# Patient Record
Sex: Male | Born: 1935 | Race: White | Hispanic: No | Marital: Single | Smoking: Former smoker
Health system: Southern US, Community
[De-identification: ages and names within clinical notes are randomized; demographics above are authoritative.]

## PROBLEM LIST (undated history)

## (undated) DIAGNOSIS — M1990 Unspecified osteoarthritis, unspecified site: Secondary | ICD-10-CM

## (undated) DIAGNOSIS — Z860100 Personal history of colon polyps, unspecified: Secondary | ICD-10-CM

## (undated) DIAGNOSIS — I10 Essential (primary) hypertension: Secondary | ICD-10-CM

## (undated) DIAGNOSIS — M19049 Primary osteoarthritis, unspecified hand: Secondary | ICD-10-CM

## (undated) DIAGNOSIS — R0600 Dyspnea, unspecified: Secondary | ICD-10-CM

## (undated) DIAGNOSIS — I498 Other specified cardiac arrhythmias: Secondary | ICD-10-CM

## (undated) DIAGNOSIS — I4510 Unspecified right bundle-branch block: Secondary | ICD-10-CM

## (undated) DIAGNOSIS — G4733 Obstructive sleep apnea (adult) (pediatric): Secondary | ICD-10-CM

## (undated) HISTORY — DX: Personal history of colon polyps, unspecified: Z86.0100

## (undated) HISTORY — PX: PILONIDAL CYST DRAINAGE: SHX743

## (undated) HISTORY — DX: Unspecified osteoarthritis, unspecified site: M19.90

## (undated) HISTORY — DX: Obstructive sleep apnea (adult) (pediatric): G47.33

## (undated) HISTORY — DX: Unspecified right bundle-branch block: I45.10

## (undated) HISTORY — DX: Personal history of colonic polyps: Z86.010

## (undated) HISTORY — DX: Other specified cardiac arrhythmias: I49.8

## (undated) HISTORY — DX: Dyspnea, unspecified: R06.00

## (undated) HISTORY — DX: Primary osteoarthritis, unspecified hand: M19.049

## (undated) HISTORY — DX: Essential (primary) hypertension: I10

---

## 1958-05-15 HISTORY — PX: HEMORRHOID SURGERY: SHX153

## 1998-10-13 ENCOUNTER — Ambulatory Visit (HOSPITAL_COMMUNITY): Admission: RE | Admit: 1998-10-13 | Discharge: 1998-10-13 | Payer: Self-pay | Admitting: Internal Medicine

## 1998-10-13 ENCOUNTER — Encounter: Payer: Self-pay | Admitting: Internal Medicine

## 2001-02-14 ENCOUNTER — Ambulatory Visit (HOSPITAL_BASED_OUTPATIENT_CLINIC_OR_DEPARTMENT_OTHER): Admission: RE | Admit: 2001-02-14 | Discharge: 2001-02-14 | Payer: Self-pay | Admitting: Internal Medicine

## 2002-05-15 DIAGNOSIS — I498 Other specified cardiac arrhythmias: Secondary | ICD-10-CM

## 2002-05-15 HISTORY — PX: CARDIAC ELECTROPHYSIOLOGY STUDY AND ABLATION: SHX1294

## 2002-05-15 HISTORY — DX: Other specified cardiac arrhythmias: I49.8

## 2002-06-18 ENCOUNTER — Ambulatory Visit (HOSPITAL_COMMUNITY): Admission: RE | Admit: 2002-06-18 | Discharge: 2002-06-18 | Payer: Self-pay | Admitting: Internal Medicine

## 2002-06-18 ENCOUNTER — Encounter: Payer: Self-pay | Admitting: Internal Medicine

## 2002-08-06 ENCOUNTER — Ambulatory Visit (HOSPITAL_COMMUNITY): Admission: RE | Admit: 2002-08-06 | Discharge: 2002-08-06 | Payer: Self-pay | Admitting: Internal Medicine

## 2002-08-06 ENCOUNTER — Encounter: Payer: Self-pay | Admitting: Internal Medicine

## 2002-11-09 ENCOUNTER — Emergency Department (HOSPITAL_COMMUNITY): Admission: EM | Admit: 2002-11-09 | Discharge: 2002-11-09 | Payer: Self-pay | Admitting: Emergency Medicine

## 2004-05-31 ENCOUNTER — Ambulatory Visit: Payer: Self-pay | Admitting: Gastroenterology

## 2004-06-08 ENCOUNTER — Ambulatory Visit: Payer: Self-pay | Admitting: Gastroenterology

## 2004-07-29 ENCOUNTER — Ambulatory Visit: Payer: Self-pay | Admitting: Endocrinology

## 2004-10-05 ENCOUNTER — Ambulatory Visit: Payer: Self-pay | Admitting: Cardiovascular Disease

## 2004-10-06 ENCOUNTER — Ambulatory Visit: Payer: Self-pay | Admitting: Internal Medicine

## 2004-10-31 ENCOUNTER — Ambulatory Visit: Payer: Self-pay | Admitting: Internal Medicine

## 2004-12-05 ENCOUNTER — Ambulatory Visit: Payer: Self-pay | Admitting: Internal Medicine

## 2005-08-09 ENCOUNTER — Ambulatory Visit: Payer: Self-pay | Admitting: Cardiovascular Disease

## 2005-11-09 ENCOUNTER — Ambulatory Visit: Payer: Self-pay | Admitting: Internal Medicine

## 2005-12-15 ENCOUNTER — Ambulatory Visit: Payer: Self-pay | Admitting: Internal Medicine

## 2005-12-18 ENCOUNTER — Ambulatory Visit: Payer: Self-pay | Admitting: Internal Medicine

## 2006-07-12 ENCOUNTER — Ambulatory Visit: Payer: Self-pay | Admitting: Cardiovascular Disease

## 2006-08-29 ENCOUNTER — Ambulatory Visit: Payer: Self-pay

## 2007-01-31 ENCOUNTER — Ambulatory Visit: Payer: Self-pay | Admitting: Internal Medicine

## 2007-01-31 LAB — CONVERTED CEMR LAB
ALT: 33 units/L (ref 0–53)
AST: 27 units/L (ref 0–37)
Albumin: 3.9 g/dL (ref 3.5–5.2)
Alkaline Phosphatase: 15 units/L — ABNORMAL LOW (ref 39–117)
BUN: 12 mg/dL (ref 6–23)
Basophils Absolute: 0.1 10*3/uL (ref 0.0–0.1)
Basophils Relative: 0.8 % (ref 0.0–1.0)
Bilirubin Urine: NEGATIVE
Bilirubin, Direct: 0.1 mg/dL (ref 0.0–0.3)
CO2: 30 meq/L (ref 19–32)
Calcium: 9.5 mg/dL (ref 8.4–10.5)
Chloride: 102 meq/L (ref 96–112)
Creatinine, Ser: 1 mg/dL (ref 0.4–1.5)
Eosinophils Absolute: 0.2 10*3/uL (ref 0.0–0.6)
Eosinophils Relative: 1.5 % (ref 0.0–5.0)
GFR calc Af Amer: 96 mL/min
GFR calc non Af Amer: 79 mL/min
Glucose, Bld: 93 mg/dL (ref 70–99)
HCT: 45.5 % (ref 39.0–52.0)
Hemoglobin, Urine: NEGATIVE
Hemoglobin: 15.8 g/dL (ref 13.0–17.0)
Ketones, ur: NEGATIVE mg/dL
Leukocytes, UA: NEGATIVE
Lymphocytes Relative: 25.4 % (ref 12.0–46.0)
MCHC: 34.6 g/dL (ref 30.0–36.0)
MCV: 86.6 fL (ref 78.0–100.0)
Monocytes Absolute: 1 10*3/uL — ABNORMAL HIGH (ref 0.2–0.7)
Monocytes Relative: 9.4 % (ref 3.0–11.0)
Neutro Abs: 6.5 10*3/uL (ref 1.4–7.7)
Neutrophils Relative %: 62.9 % (ref 43.0–77.0)
Nitrite: NEGATIVE
PSA: 0.61 ng/mL (ref 0.10–4.00)
Platelets: 277 10*3/uL (ref 150–400)
Potassium: 4.3 meq/L (ref 3.5–5.1)
RBC: 5.26 M/uL (ref 4.22–5.81)
RDW: 13.3 % (ref 11.5–14.6)
Sodium: 140 meq/L (ref 135–145)
Specific Gravity, Urine: 1.02 (ref 1.000–1.03)
TSH: 2.17 microintl units/mL (ref 0.35–5.50)
Total Bilirubin: 0.8 mg/dL (ref 0.3–1.2)
Total Protein, Urine: NEGATIVE mg/dL
Total Protein: 7.4 g/dL (ref 6.0–8.3)
Urine Glucose: NEGATIVE mg/dL
Urobilinogen, UA: 0.2 (ref 0.0–1.0)
WBC: 10.5 10*3/uL (ref 4.5–10.5)
pH: 6 (ref 5.0–8.0)

## 2007-05-23 ENCOUNTER — Telehealth: Payer: Self-pay | Admitting: Internal Medicine

## 2007-05-27 ENCOUNTER — Ambulatory Visit: Payer: Self-pay | Admitting: Internal Medicine

## 2007-05-27 LAB — CONVERTED CEMR LAB: Uric Acid, Serum: 7.5 mg/dL — ABNORMAL HIGH (ref 2.4–7.0)

## 2007-05-29 ENCOUNTER — Encounter: Payer: Self-pay | Admitting: Internal Medicine

## 2007-06-25 ENCOUNTER — Ambulatory Visit: Payer: Self-pay | Admitting: Internal Medicine

## 2007-06-25 DIAGNOSIS — M109 Gout, unspecified: Secondary | ICD-10-CM | POA: Insufficient documentation

## 2007-06-25 DIAGNOSIS — I10 Essential (primary) hypertension: Secondary | ICD-10-CM | POA: Insufficient documentation

## 2007-07-23 ENCOUNTER — Ambulatory Visit: Payer: Self-pay | Admitting: Internal Medicine

## 2007-07-23 DIAGNOSIS — M19019 Primary osteoarthritis, unspecified shoulder: Secondary | ICD-10-CM | POA: Insufficient documentation

## 2007-07-23 DIAGNOSIS — M6530 Trigger finger, unspecified finger: Secondary | ICD-10-CM | POA: Insufficient documentation

## 2008-01-17 ENCOUNTER — Ambulatory Visit: Payer: Self-pay | Admitting: Cardiovascular Disease

## 2008-01-17 LAB — CONVERTED CEMR LAB: Hgb A1c MFr Bld: 6.4 % — ABNORMAL HIGH (ref 4.6–6.0)

## 2008-03-23 ENCOUNTER — Ambulatory Visit: Payer: Self-pay | Admitting: Internal Medicine

## 2008-03-23 DIAGNOSIS — E1129 Type 2 diabetes mellitus with other diabetic kidney complication: Secondary | ICD-10-CM | POA: Insufficient documentation

## 2008-03-23 DIAGNOSIS — M79609 Pain in unspecified limb: Secondary | ICD-10-CM | POA: Insufficient documentation

## 2008-04-21 ENCOUNTER — Ambulatory Visit: Payer: Self-pay | Admitting: Internal Medicine

## 2008-07-16 ENCOUNTER — Ambulatory Visit: Payer: Self-pay | Admitting: Internal Medicine

## 2008-07-16 LAB — CONVERTED CEMR LAB: Hgb A1c MFr Bld: 6.2 % — ABNORMAL HIGH (ref 4.6–6.0)

## 2008-07-17 ENCOUNTER — Telehealth: Payer: Self-pay | Admitting: Internal Medicine

## 2008-10-08 ENCOUNTER — Telehealth: Payer: Self-pay | Admitting: Internal Medicine

## 2008-12-16 ENCOUNTER — Telehealth: Payer: Self-pay | Admitting: Internal Medicine

## 2009-01-14 DIAGNOSIS — I498 Other specified cardiac arrhythmias: Secondary | ICD-10-CM | POA: Insufficient documentation

## 2009-01-14 DIAGNOSIS — D126 Benign neoplasm of colon, unspecified: Secondary | ICD-10-CM | POA: Insufficient documentation

## 2009-01-14 DIAGNOSIS — I4510 Unspecified right bundle-branch block: Secondary | ICD-10-CM | POA: Insufficient documentation

## 2009-01-14 DIAGNOSIS — M129 Arthropathy, unspecified: Secondary | ICD-10-CM | POA: Insufficient documentation

## 2009-01-19 ENCOUNTER — Ambulatory Visit: Payer: Self-pay | Admitting: Cardiovascular Disease

## 2009-02-09 ENCOUNTER — Telehealth: Payer: Self-pay | Admitting: Internal Medicine

## 2009-04-28 ENCOUNTER — Ambulatory Visit: Payer: Self-pay | Admitting: Internal Medicine

## 2009-04-28 DIAGNOSIS — H6122 Impacted cerumen, left ear: Secondary | ICD-10-CM | POA: Insufficient documentation

## 2009-04-28 DIAGNOSIS — R0609 Other forms of dyspnea: Secondary | ICD-10-CM | POA: Insufficient documentation

## 2009-04-28 DIAGNOSIS — R0600 Dyspnea, unspecified: Secondary | ICD-10-CM | POA: Insufficient documentation

## 2009-04-28 DIAGNOSIS — H9190 Unspecified hearing loss, unspecified ear: Secondary | ICD-10-CM | POA: Insufficient documentation

## 2009-04-28 LAB — CONVERTED CEMR LAB
BUN: 10 mg/dL (ref 6–23)
CO2: 30 meq/L (ref 19–32)
Calcium: 9.2 mg/dL (ref 8.4–10.5)
Chloride: 103 meq/L (ref 96–112)
Cholesterol: 139 mg/dL (ref 0–200)
Creatinine, Ser: 1 mg/dL (ref 0.4–1.5)
GFR calc non Af Amer: 78.25 mL/min (ref >60)
Glucose, Bld: 134 mg/dL — ABNORMAL HIGH (ref 70–99)
HDL: 38.8 mg/dL — ABNORMAL LOW (ref >39.00)
Hgb A1c MFr Bld: 6.7 % — ABNORMAL HIGH (ref 4.6–6.5)
LDL Cholesterol: 74 mg/dL (ref 0–99)
Potassium: 3.9 meq/L (ref 3.5–5.1)
Sodium: 142 meq/L (ref 135–145)
Total CHOL/HDL Ratio: 4
Triglycerides: 131 mg/dL (ref 0.0–149.0)
VLDL: 26.2 mg/dL (ref 0.0–40.0)

## 2009-04-28 LAB — HM DIABETES FOOT EXAM

## 2009-06-17 ENCOUNTER — Encounter: Payer: Self-pay | Admitting: Internal Medicine

## 2009-08-23 ENCOUNTER — Ambulatory Visit: Payer: Self-pay | Admitting: Internal Medicine

## 2009-08-23 DIAGNOSIS — R252 Cramp and spasm: Secondary | ICD-10-CM | POA: Insufficient documentation

## 2009-10-18 ENCOUNTER — Encounter: Payer: Self-pay | Admitting: Internal Medicine

## 2009-10-18 ENCOUNTER — Telehealth: Payer: Self-pay | Admitting: Internal Medicine

## 2010-02-08 ENCOUNTER — Ambulatory Visit: Payer: Self-pay | Admitting: Cardiovascular Disease

## 2010-02-09 ENCOUNTER — Telehealth (INDEPENDENT_AMBULATORY_CARE_PROVIDER_SITE_OTHER): Payer: Self-pay | Admitting: *Deleted

## 2010-02-10 ENCOUNTER — Telehealth: Payer: Self-pay | Admitting: Cardiovascular Disease

## 2010-04-29 ENCOUNTER — Ambulatory Visit: Payer: Self-pay | Admitting: Internal Medicine

## 2010-04-29 ENCOUNTER — Encounter: Payer: Self-pay | Admitting: Internal Medicine

## 2010-04-29 LAB — CONVERTED CEMR LAB
BUN: 16 mg/dL (ref 6–23)
Calcium: 9.1 mg/dL (ref 8.4–10.5)
Creatinine, Ser: 1 mg/dL (ref 0.4–1.5)
GFR calc non Af Amer: 76.27 mL/min (ref >60.00)
Glucose, Bld: 121 mg/dL — ABNORMAL HIGH (ref 70–99)
PSA, Free Pct: 41 (ref >25)
PSA: 0.73 ng/mL (ref <=4.00)
Total CHOL/HDL Ratio: 4

## 2010-05-25 ENCOUNTER — Telehealth: Payer: Self-pay | Admitting: Internal Medicine

## 2010-05-31 ENCOUNTER — Ambulatory Visit
Admission: RE | Admit: 2010-05-31 | Discharge: 2010-05-31 | Payer: Self-pay | Source: Home / Self Care | Attending: Pulmonary Disease | Admitting: Pulmonary Disease

## 2010-05-31 DIAGNOSIS — G4733 Obstructive sleep apnea (adult) (pediatric): Secondary | ICD-10-CM | POA: Insufficient documentation

## 2010-06-05 ENCOUNTER — Encounter: Payer: Self-pay | Admitting: Internal Medicine

## 2010-06-14 NOTE — Letter (Signed)
Summary: [facility]   Imported By: Lennie Odor 12/02/2009 10:20:49  _____________________________________________________________________  External Attachment:    Type:   Image     Comment:   External Document

## 2010-06-14 NOTE — Assessment & Plan Note (Signed)
Summary: CONGESTION/COUGH/ NWS   Vital Signs:  Patient profile:   75 year old male Height:      70 inches Weight:      268.75 pounds BMI:     38.70 O2 Sat:      95 % on Room air Temp:     97.4 degrees F oral Pulse rate:   73 / minute BP sitting:   120 / 72  (left arm) Cuff size:   regular  Vitals Entered ByZella Ball Ewing (August 23, 2009 9:17 AM)  O2 Flow:  Room air CC: Congestion, cough, leg cramps/RE   Primary Care Provider:  Norins  CC:  Congestion, cough, and leg cramps/RE.  History of Present Illness: here with 3 days acute mild to mod bronchitic symtpoms actually better by today, with mild non prod cough, non prod and Pt denies CP, sob, doe, wheezing, orthopnea, pnd, worsening LE edema, palps, dizziness or syncope  Pt denies new neuro symptoms such as headache, facial or extremity weakness   also with > 6 wks onset leg cramps at night (but none in the past wk);  has lost some wt 10 lbs since dec 2010 when seen last with dr Debby Bud;  at that time pt began walking about 3 miles per day, 5 days per wk at the [facility] and the cramp onset seemed onset with this walking.  Has not walked for at leastt 4 wks, and no leg cramps for the past 1 wk.  No claudication type symptoms, LBp, bowel or bladder changed, other fever, unintent wt loss, or other LE pain, weakness or numb.    Problems Prior to Update: 1)  Leg Cramps  (ICD-729.82) 2)  Bronchitis-acute  (ICD-466.0) 3)  Routine General Medical Exam@[facility]  (ICD-V70.0) 4)  Hearing Loss  (ICD-389.9) 5)  Right Bundle Branch Block  (ICD-426.4) 6)  Supraventricular Tachycardia  (ICD-427.89) 7)  Hypertension  (ICD-401.9) 8)  Arthritis  (ICD-716.90) 9)  Hx of Dyspnea  (ICD-786.05) 10)  Leg Pain, Right  (ICD-729.5) 11)  Diabetes Mellitus  (ICD-250.00) 12)  Hx of Trigger Finger  (ICD-727.03) 13)  Loc Osteoarthros Not Spec Whether Prim/sec Hand  (ICD-715.34) 14)  Gout  (ICD-274.9) 15)  Colonic Polyps  (ICD-211.3)  Medications Prior  to Update: 1)  Hyzaar 100-25 Mg Tabs (Losartan Potassium-Hctz) .Marland Kitchen.. 1 By Mouth Once Daily 2)  Lotrisone 1-0.05 % Crea (Clotrimazole-Betamethasone) .... Apply A Small Amount To Skin Twice A Day As Needed 3)  Toprol Xl 50 Mg Tb24 (Metoprolol Succinate) .... Take 1 Tablet By Mouth Once A Day 4)  Adult Aspirin Low Strength 81 Mg  Tbdp (Aspirin) .... Take 1 Tablet By Mouth Once A Day 5)  Colchicine 0.6 Mg  Tabs (Colchicine) .... One By Mouth Once Daily As Needed  Current Medications (verified): 1)  Hyzaar 100-25 Mg Tabs (Losartan Potassium-Hctz) .Marland Kitchen.. 1 By Mouth Once Daily 2)  Lotrisone 1-0.05 % Crea (Clotrimazole-Betamethasone) .... Apply A Small Amount To Skin Twice A Day As Needed 3)  Toprol Xl 50 Mg Tb24 (Metoprolol Succinate) .... Take 1 Tablet By Mouth Once A Day 4)  Adult Aspirin Low Strength 81 Mg  Tbdp (Aspirin) .... Take 1 Tablet By Mouth Once A Day 5)  Colchicine 0.6 Mg  Tabs (Colchicine) .... One By Mouth Once Daily As Needed 6)  Hydrocodone-Homatropine 5-1.5 Mg/25ml Syrp (Hydrocodone-Homatropine) .Marland Kitchen.. 1 Tsp By Mouth Q 6 Hrs As Needed Cough  Allergies (verified): No Known Drug Allergies  Past History:  Past Medical History: Last  updated: 04/28/2009 RIGHT BUNDLE BRANCH BLOCK (ICD-426.4) SUPRAVENTRICULAR TACHYCARDIA (ICD-427.89) HYPERTENSION (ICD-401.9) ARTHRITIS (ICD-716.90) Hx of DYSPNEA (ICD-786.05) LEG PAIN, RIGHT (ICD-729.5) DIABETES MELLITUS (ICD-250.00) Hx of TRIGGER FINGER (ICD-727.03) LOC OSTEOARTHROS NOT SPEC WHETHER PRIM/SEC HAND (ICD-715.34) GOUT (ICD-274.9) COLONIC POLYPS (ICD-211.3)   Physician Roster:                    Cardiology - Dr. Eden Emms                    GU             - Dr. Andee Poles- in the past.                     GI               - Dr. Dominica Severin        - Dr. Elmer Picker  Past Surgical History: Last updated: 01/14/2009 Hemorrhoidectomy '60s Pilonidal cyst repair '60 Cardiac ablation '04  ablation for  SVT with a chronic right  bundle-branch block  Social History: Last updated: 04/28/2009 HSG, [employer] Work: Psychologist, educational and Equities trader I-ADLs End-of-Life: Yes- CPR, Yes- short-term Mechanical Ventilation, but no prolonged ventilation. Is willing to undergo dialysis, prolonged tube feeding.   Risk Factors: Alcohol Use: <1 (03/23/2008) Caffeine Use: 2 (03/23/2008) Exercise: no (03/23/2008)  Risk Factors: Smoking Status: quit (03/23/2008) Passive Smoke Exposure: no (03/23/2008)  Review of Systems       all otherwise negative per pt -    Physical Exam  General:  alert and overweight-appearing.  , nontoxic Head:  normocephalic and atraumatic.   Eyes:  vision grossly intact, pupils equal, and pupils round.   Ears:  bilat tm;s mild red, sinus nontender Nose:  nasal dischargemucosal pallor and mucosal edema.   Mouth:  pharyngeal erythema and fair dentition.   Neck:  supple and no masses.   Lungs:  normal respiratory effort and normal breath sounds.   Heart:  normal rate and regular rhythm.   Extremities:  no edema, no erythema    Impression & Recommendations:  Problem # 1:  BRONCHITIS-ACUTE (ICD-466.0)  His updated medication list for this problem includes:    Hydrocodone-homatropine 5-1.5 Mg/68ml Syrp (Hydrocodone-homatropine) .Marland Kitchen... 1 tsp by mouth q 6 hrs as needed cough treat as above, f/u any worsening signs or symptoms ; appears to be c/w viral, improving  Problem # 2:  LEG CRAMPS (ICD-729.82) d/w pt, consider taking b complex vitamin, but appears improved and prev related to increased excercise  Problem # 3:  HYPERTENSION (ICD-401.9)  His updated medication list for this problem includes:    Hyzaar 100-25 Mg Tabs (Losartan potassium-hctz) .Marland Kitchen... 1 by mouth once daily    Toprol Xl 50 Mg Tb24 (Metoprolol succinate) .Marland Kitchen... Take 1 tablet by mouth once a day  BP today: 120/72 Prior BP: 122/84 (04/28/2009)  Labs Reviewed: K+: 3.9 (04/28/2009) Creat: : 1.0 (04/28/2009)   Chol: 139  (04/28/2009)   HDL: 38.80 (04/28/2009)   LDL: 74 (04/28/2009)   TG: 131.0 (04/28/2009) stable overall by hx and exam, ok to continue meds/tx as is   Complete Medication List: 1)  Hyzaar 100-25 Mg Tabs (Losartan potassium-hctz) .Marland Kitchen.. 1 by mouth once daily 2)  Lotrisone 1-0.05 % Crea (Clotrimazole-betamethasone) .... Apply a small amount to skin twice a day as needed 3)  Toprol Xl  50 Mg Tb24 (Metoprolol succinate) .... Take 1 tablet by mouth once a day 4)  Adult Aspirin Low Strength 81 Mg Tbdp (Aspirin) .... Take 1 tablet by mouth once a day 5)  Colchicine 0.6 Mg Tabs (Colchicine) .... One by mouth once daily as needed 6)  Hydrocodone-homatropine 5-1.5 Mg/64ml Syrp (Hydrocodone-homatropine) .Marland Kitchen.. 1 tsp by mouth q 6 hrs as needed cough  Patient Instructions: 1)  Please take all new medications as prescribed 2)  Continue all previous medications as before this visit  3)  Plesae consider B complex vitamin - 1 per day - for return of leg cramps, though it seems that your recent excercise likely explained the cramps 4)  Please schedule an appointment with your primary doctor as needed for any worsening sign or symtpoms Prescriptions: HYDROCODONE-HOMATROPINE 5-1.5 MG/5ML SYRP (HYDROCODONE-HOMATROPINE) 1 tsp by mouth q 6 hrs as needed cough  #6 oz x 1   Entered and Authorized by:   Corwin Levins MD   Signed by:   Corwin Levins MD on 08/23/2009   Method used:   Print then Give to Patient   RxID:   [id number]

## 2010-06-14 NOTE — Progress Notes (Signed)
Summary: Aware MD is out of office  Phone Note From Other Clinic   Caller: Dr Hecker's office [phone] - AMBER Summary of Call: Pt is going to be scheduled for skin tag removal from upper eyelid. Is it ok to d/c asprin x a couple wks prior to surgery?   Initial call taken by: Lamar Sprinkles, CMA,  October 18, 2009 4:49 PM  Follow-up for Phone Call        yes - ok to d/c aspirin x 3 weeks prior to surgery.  Follow-up by: Jacques Navy MD,  October 20, 2009 5:36 PM  Additional Follow-up for Phone Call Additional follow up Details #1::        Returned call spoke with Joice Lofts and advised per MD..Marland KitchenAlvy Beal Archie CMA  October 21, 2009 10:54 AM

## 2010-06-14 NOTE — Therapy (Signed)
Summary: [facility]   Imported By: Lester [place] 07/30/2009 10:18:54  _____________________________________________________________________  External Attachment:    Type:   Image     Comment:   External Document

## 2010-06-14 NOTE — Assessment & Plan Note (Signed)
Summary: yearly/sl      Allergies Added: NKDA  Primary Provider:  Norins  CC:  yearly visit.  History of Present Illness: Matthew Villanueva is seen today in F/U for RBBBl, palpitations with previous SVT ablation and hypertension.  He takes his BP daily and has been in a good range.  He has not had recurrent palptiatoins.  His weight is down about 8 lbs since 9/09 but he still is overweight.  He has been trying a low carb diet but needs more exercise.  there has been no palpitaoitns, PND orthopnea.  No SSCP. He is looking forward to a trip to [place]  Current Problems (verified): 1)  Leg Cramps  (ICD-729.82) 2)  Routine General Medical Exam@[facility]  (ICD-V70.0) 3)  Hearing Loss  (ICD-389.9) 4)  Right Bundle Branch Block  (ICD-426.4) 5)  Supraventricular Tachycardia  (ICD-427.89) 6)  Hypertension  (ICD-401.9) 7)  Arthritis  (ICD-716.90) 8)  Hx of Dyspnea  (ICD-786.05) 9)  Leg Pain, Right  (ICD-729.5) 10)  Diabetes Mellitus  (ICD-250.00) 11)  Hx of Trigger Finger  (ICD-727.03) 12)  Loc Osteoarthros Not Spec Whether Prim/sec Hand  (ICD-715.34) 13)  Gout  (ICD-274.9) 14)  Colonic Polyps  (ICD-211.3)  Current Medications (verified): 1)  Hyzaar 100-25 Mg Tabs (Losartan Potassium-Hctz) .Marland Kitchen.. 1 By Mouth Once Daily 2)  Lotrisone 1-0.05 % Crea (Clotrimazole-Betamethasone) .... Apply A Small Amount To Skin Twice A Day As Needed 3)  Toprol Xl 50 Mg Tb24 (Metoprolol Succinate) .... Take 1 Tablet By Mouth Once A Day 4)  Adult Aspirin Low Strength 81 Mg  Tbdp (Aspirin) .... Take 1 Tablet By Mouth Once A Day  Allergies (verified): No Known Drug Allergies  Past History:  Past Medical History: Last updated: 04/28/2009 RIGHT BUNDLE BRANCH BLOCK (ICD-426.4) SUPRAVENTRICULAR TACHYCARDIA (ICD-427.89) HYPERTENSION (ICD-401.9) ARTHRITIS (ICD-716.90) Hx of DYSPNEA (ICD-786.05) LEG PAIN, RIGHT (ICD-729.5) DIABETES MELLITUS (ICD-250.00) Hx of TRIGGER FINGER (ICD-727.03) LOC OSTEOARTHROS NOT SPEC  WHETHER PRIM/SEC HAND (ICD-715.34) GOUT (ICD-274.9) COLONIC POLYPS (ICD-211.3)   Physician Roster:                    Cardiology - Dr. Eden Emms                    GU             - Dr. Andee Poles- in the past.                     GI               - Dr. Dominica Severin        - Dr. Elmer Picker  Past Surgical History: Last updated: 01/14/2009 Hemorrhoidectomy '60s Pilonidal cyst repair '60 Cardiac ablation '04  ablation for  SVT with a chronic right bundle-branch block  Family History: Last updated: 28-Mar-2008 father - deceased @61 : throat and tongue cancer mother - deceased @90 : old age Neg - prostate or colon cancer;  Uncle -DM MGM - CAD/MI MGF - CAD (?) MAunt - CAD  Social History: Last updated: 04/28/2009 HSG, [employer] Work: Psychologist, educational and Equities trader I-ADLs End-of-Life: Yes- CPR, Yes- short-term Mechanical Ventilation, but no prolonged ventilation. Is willing to undergo dialysis, prolonged tube feeding.   Review of Systems       Denies fever, malais, weight loss, blurry vision, decreased visual acuity, cough, sputum, SOB,  hemoptysis, pleuritic pain, palpitaitons, heartburn, abdominal pain, melena, lower extremity edema, claudication, or rash.   Vital Signs:  Patient profile:   75 year old male Height:      70 inches Weight:      271 pounds BMI:     39.03 Pulse rate:   76 / minute Resp:     12 per minute BP sitting:   104 / 62  (left arm)  Vitals Entered By: Kem Parkinson (February 08, 2010 11:46 AM)  Physical Exam  General:  Affect appropriate Healthy:  appears stated age HEENT: normal Neck supple with no adenopathy JVP normal no bruits no thyromegaly Lungs clear with no wheezing and good diaphragmatic motion Heart:  S1/S2 no murmur,rub, gallop or click PMI normal Abdomen: benighn, BS positve, no tenderness, no AAA no bruit.  No HSM or HJR Distal pulses intact with no bruits No edema Neuro non-focal Skin warm and  dry    Impression & Recommendations:  Problem # 1:  SUPRAVENTRICULAR TACHYCARDIA (ICD-427.89) Resolved post ablatin Continue BB His updated medication list for this problem includes:    Toprol Xl 50 Mg Tb24 (Metoprolol succinate) .Marland Kitchen... Take 1 tablet by mouth once a day    Adult Aspirin Low Strength 81 Mg Tbdp (Aspirin) .Marland Kitchen... Take 1 tablet by mouth once a day  Problem # 2:  HYPERTENSION (ICD-401.9) No added salt diet.  Refill for Hyzaar called in His updated medication list for this problem includes:    Hyzaar 100-25 Mg Tabs (Losartan potassium-hctz) .Marland Kitchen... 1 by mouth once daily    Toprol Xl 50 Mg Tb24 (Metoprolol succinate) .Marland Kitchen... Take 1 tablet by mouth once a day    Adult Aspirin Low Strength 81 Mg Tbdp (Aspirin) .Marland Kitchen... Take 1 tablet by mouth once a day  Problem # 3:  RIGHT BUNDLE BRANCH BLOCK (ICD-426.4) Stable no evidence of high grade heart block His updated medication list for this problem includes:    Toprol Xl 50 Mg Tb24 (Metoprolol succinate) .Marland Kitchen... Take 1 tablet by mouth once a day    Adult Aspirin Low Strength 81 Mg Tbdp (Aspirin) .Marland Kitchen... Take 1 tablet by mouth once a day  Patient Instructions: 1)  Your physician recommends that you schedule a follow-up appointment in: ONE YEAR Prescriptions: TOPROL XL 50 MG TB24 (METOPROLOL SUCCINATE) Take 1 tablet by mouth once a day  #90 Tablet x 3   Entered by:   Deliah Goody, RN   Authorized by:   Colon Branch, MD, [facility]   Signed by:   Deliah Goody, RN on 02/08/2010   Method used:   Faxed to ...       [facility]* (retail)             ,          Ph: [phone]       Fax: [phone]   RxID:   [id number] HYZAAR 100-25 MG TABS (LOSARTAN POTASSIUM-HCTZ) 1 by mouth once daily  #90 x 3   Entered by:   Deliah Goody, RN   Authorized by:   Colon Branch, MD, [facility]   Signed by:   Deliah Goody, RN on 02/08/2010   Method used:   Faxed to ...       [facility]* (retail)             ,          Ph: [phone]        Fax: [phone]   RxID:   [id number]  EKG Report  Procedure date:  02/08/2010  Findings:      NSR 76 RBBB

## 2010-06-14 NOTE — Progress Notes (Signed)
Summary: refill request   Phone Note Refill Request Message from:  Patient on February 10, 2010 1:04 PM  pt was in yesterday and metaprolol and losartin were to be sent to [facility]-checking to make sure it was sent-this was requested yesterday, however pt has two accounts in [place] one under charles and one under Diogo and he said his name was Lemonte so resending msg   Method Requested: Fax to Local Pharmacy Initial call taken by: Glynda Jaeger,  February 10, 2010 1:06 PM Caller: Patient  Follow-up for Phone Call        spoke with pt it was taken care of Follow-up by: Kem Parkinson,  February 10, 2010 3:59 PM

## 2010-06-16 NOTE — Assessment & Plan Note (Signed)
Summary: YEARLY FU/MEDICARE/ NWS  #   Vital Signs:  Patient profile:   75 year old male Height:      70 inches Weight:      277 pounds BMI:     39.89 O2 Sat:      94 % on Room air Temp:     97.5 degrees F oral Pulse rate:   61 / minute BP sitting:   114 / 74  (right arm)  Vitals Entered By: Bill Salinas CMA (April 29, 2010 10:02 AM)  O2 Flow:  Room air CC: yearly/ ab   Primary Care Provider:  Norins  CC:  yearly/ ab.  History of Present Illness: Patient presents for a routine medical exam. He did see Dr. Eden Emms in September with good evaluation.  He has OSA but cannot tolerate CPAP. He was evaluated a bout 12 years ago with no follow -up.  Small red lesion right upper eyelid that is a little tender.  He is interested in immunizations: he had pneumovax at age 47. He is interested in zostavax.   He had a fall a while back and landed on his left knee which was sore, improved but is still tender when he kneels on it.   He had tried levitra with unsatisfactory response, interested in another product.   He remains 100% independent in ADLs. He has no signs or symptoms of depression. 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 He has had on fall but is not prone to falls.  Preventive Screening-Counseling & Management  Alcohol-Tobacco     Alcohol drinks/day: 0     Smoking Status: quit     Year Quit: 1986  Caffeine-Diet-Exercise     Caffeine use/day: 3 cups per day     Diet Comments: regular diet - not calorie controlled     Diet Counseling: to improve diet; diet is suboptimal     Does Patient Exercise: no     Exercise Counseling: to improve exercise regimen  Hep-HIV-STD-Contraception     Hepatitis Risk: no risk noted     HIV Risk: no risk noted     STD Risk: no risk noted     Dental Visit-last 6 months yes     TSE monthly: no     Sun Exposure-Excessive: no  Safety-Violence-Falls     Seat Belt Use: yes     Helmet Use: n/a  Firearms in the Home: no firearms in the home     Smoke Detectors: yes     Violence in the Home: no risk noted     Sexual Abuse: no     Fall Risk: low fall risk      Drug Use:  never.        Blood Transfusions:  no.    Current Medications (verified): 1)  Hyzaar 100-25 Mg Tabs (Losartan Potassium-Hctz) .Marland Kitchen.. 1 By Mouth Once Daily 2)  Lotrisone 1-0.05 % Crea (Clotrimazole-Betamethasone) .... Apply A Small Amount To Skin Twice A Day As Needed 3)  Toprol Xl 50 Mg Tb24 (Metoprolol Succinate) .... Take 1 Tablet By Mouth Once A Day 4)  Adult Aspirin Low Strength 81 Mg  Tbdp (Aspirin) .... 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 Eden Emms                    GU             - Dr. Andee Poles- in the past.                     GI               - Dr. Dominica Severin        - Dr. Elmer Picker  Past Surgical History: Last updated: 01/14/2009 Hemorrhoidectomy '60s Pilonidal cyst repair '60 Cardiac ablation '04  ablation for  SVT with a chronic right bundle-branch block  Family History: Last updated: April 04, 2008 father - deceased @61 : throat and tongue cancer mother - deceased @90 : old age Neg - prostate or colon cancer;  Uncle -DM MGM - CAD/MI MGF - CAD (?) MAunt - CAD  Social History: Last updated: 04/28/2009 HSG, [employer] Work: Psychologist, educational and Equities trader I-ADLs End-of-Life: Yes- CPR, Yes- short-term Mechanical Ventilation, but no prolonged  ventilation. Is willing to undergo dialysis, prolonged tube feeding.   Social History: Caffeine use/day:  3 cups per day Dental Care w/in 6 mos.:  yes Sun Exposure-Excessive:  no Seat Belt Use:  yes Fall Risk:  low fall risk Blood Transfusions:  no Hepatitis Risk:  no risk noted HIV Risk:  no risk noted STD Risk:  no risk noted Drug Use:  never  Review of Systems  The patient denies anorexia, fever, weight gain, vision loss, chest pain, dyspnea on exertion, prolonged cough, hemoptysis, abdominal pain, severe indigestion/heartburn, incontinence, suspicious skin lesions, transient blindness, difficulty walking, unusual weight change, enlarged lymph nodes, and angioedema.    Physical Exam  General:  overweight white male in no distress Head:  Normocephalic and atraumatic without obvious abnormalities. No apparent alopecia or balding. Eyes:  No corneal or conjunctival inflammation noted. EOMI. Perrla. Funduscopic exam benign, without hemorrhages, exudates or papilledema. Vision grossly normal. Ears:  External ear exam shows no significant lesions or deformities.  Otoscopic examination reveals clear canals, tympanic membranes are intact bilaterally without bulging, retraction, inflammation or discharge. Hearing is grossly normal bilaterally. Nose:  no external deformity, no external erythema, no nasal discharge, and no airflow obstruction.   Mouth:  Oral mucosa and oropharynx without lesions or exudates.  Teeth in good repair. Neck:  supple, full ROM, and no thyromegaly.   Chest Wall:  No deformities, masses, tenderness or gynecomastia noted. Lungs:  Normal respiratory effort, chest expands symmetrically. Lungs are clear to auscultation, no crackles or wheezes. Heart:  Normal rate and regular rhythm. S1 and S2 normal without gallop, murmur, click, rub or other extra sounds. Abdomen:  overweight, soft, non-tender, normal bowel sounds, no guarding, no abdominal hernia, and no hepatomegaly.     Rectal:  deferred Prostate:  deferred to normal PSA Msk:  normal ROM, no joint tenderness, no joint swelling, no joint warmth, no redness over joints, no joint deformities, and no joint instability.   Pulses:  2+ radial and DP Extremities:  No clubbing, cyanosis, edema, or deformity noted with normal full range of motion of all joints.  Neurologic:  alert & oriented X3, cranial nerves II-XII intact, strength normal in all extremities, gait normal, and DTRs symmetrical and normal.   Skin:  turgor normal, color normal, no rashes, no suspicious lesions, and no ulcerations.   Cervical Nodes:  no anterior cervical adenopathy and no posterior cervical adenopathy.   Axillary Nodes:  no R axillary adenopathy and no L axillary adenopathy.   Inguinal Nodes:  no R inguinal adenopathy and no L inguinal adenopathy.   Psych:  Oriented X3, memory intact for recent and remote, normally interactive, and good eye contact.    Diabetes Management Exam:    Foot Exam (with socks and/or shoes not present):       Sensory-Pinprick/Light touch:          Right medial foot (L-4): normal          Right dorsal foot (L-5): normal          Right lateral foot (S-1): normal       Sensory-other: decreased deep vibratory sensation distally       Nails:          Right foot: normal    Eye Exam:       Eye Exam done elsewhere          Date: 11/01/2009          Results: normal          Done by: Dr Elmer Picker   Impression & Recommendations:  Problem # 1:  SLEEP DISORDER, CHRONIC (ICD-780.50) Patient with h/o OSA diagnosed 12+ years ago. He continues to have problems. He had great trouble with CPAP in the past but is willing to have reassessment and consider another trial with more advanced devices.  Plan - referral to pulmonary/sleep specialist  Orders: Sleep Disorder Referral (Sleep Disorder)  Problem # 2:  RIGHT BUNDLE BRANCH BLOCK (ICD-426.4) He has seen Dr. Eden Emms recently and is considered to be stable.  Problem  # 3:  HYPERTENSION (ICD-401.9)  His updated medication list for this problem includes:    Hyzaar 100-25 Mg Tabs (Losartan potassium-hctz) .Marland Kitchen... 1 by mouth once daily    Toprol Xl 50 Mg Tb24 (Metoprolol succinate) .Marland Kitchen... Take 1 tablet by mouth once a day  Orders: TLB-BMP (Basic Metabolic Panel-BMET) (80048-METABOL)  BP today: 114/74 Prior BP: 104/62 (02/08/2010)  Good control on present medication - will continue the same.  Problem # 4:  ARTHRITIS (ICD-716.90) Doing well at this time without limitations in activities and no significant chronic pain.  Problem # 5:  DIABETES MELLITUS (ICD-250.00) Patient is a diet/life-style managed diabetic. He is due for A1C with recommendations to follow.  His updated medication list for this problem includes:    Hyzaar 100-25 Mg Tabs (Losartan potassium-hctz) .Marland Kitchen... 1 by mouth once daily    Adult Aspirin Low Strength 81 Mg Tbdp (Aspirin) .Marland Kitchen... Take 1 tablet by mouth once a day  Orders: TLB-A1C / Hgb A1C (Glycohemoglobin) (83036-A1C) TLB-Lipid Panel (80061-LIPID)  Addendum - A1C 6.9% with goal of 7% or less.  Plan - continue life-style mangement with sugar free, low carb diet and regular exercise. No indication for medication.  Problem # 6:  GOUT (ICD-274.9) No recent flares.  Problem # 7:  Preventive Health Care (ICD-V70.0) Interval history without major medical illness, injury or surgery. Physical exam is normal except for weight. Lab results are within normal limits except for serum glucose of 121. Reviewed lipid panel from Dec. '10 - excellent with LDL 74 thus no need to  repeat lab. Current with colorectal cancer screening with colonoscopy April '06. Current for prostate cancer screening with a PSA of 0.73 and a total:ratio of 41 % which is lowest risk. Spirometry reveals lung age to be less than chronologic age. Immunizations: Tetnus Feb '06; Pneumovax Oct '05 (age 68). [place]ingle vaccine today.  In summary - a very nice man who is medically  stable. He is counseled to develop an aerobic exercise program with a goal of 30 min at least 3 times a week; to develop better weight management with a target weight of 220 or BMI 32 down from BMI 40 with a goal of loosing 1-2 lbs/month ( 3-5 year project!). He is counseled to make smart food choices, control PORTION SIZE and exercise.  He will return in 1 year or as needed.  Complete Medication List: 1)  Hyzaar 100-25 Mg Tabs (Losartan potassium-hctz) .Marland Kitchen.. 1 by mouth once daily 2)  Lotrisone 1-0.05 % Crea (Clotrimazole-betamethasone) .... Apply a small amount to skin twice a day as needed 3)  Toprol Xl 50 Mg Tb24 (Metoprolol succinate) .... Take 1 tablet by mouth once a day 4)  Adult Aspirin Low Strength 81 Mg Tbdp (Aspirin) .... Take 1 tablet by mouth once a day  Other Orders: T-PSA Free ([address]) Zoster (Shingles) Vaccine Live [phone]) Admin 1st Vaccine (19147) Medicare -1st Annual Wellness Visit [phone]) Spirometry w/Graph (21308)  Patient: Matthew Villanueva Note: All result statuses are Final unless otherwise noted.  Tests: (1) BMP (METABOL)   Sodium                    141 mEq/L                   135-145   Potassium                 4.3 mEq/L                   3.5-5.1   Chloride                  103 mEq/L                   96-112   Carbon Dioxide            29 mEq/L                    19-32   Glucose              [H]  121 mg/dL                   65-78   BUN                       16 mg/dL                    4-69   Creatinine                1.0 mg/dL                   6.2-9.5   Calcium                   9.1 mg/dL                   2.8-41.3   GFR  76.27 mL/min                >60.00  Tests: (2) Hemoglobin A1C (A1C)   Hemoglobin A1C       [H]  6.9 %                       4.6-6.5     Glycemic Control Guidelines for People with Diabetes:     Non Diabetic:  <6%     Goal of Therapy: <7%     Additional Action Suggested:  >8%   Tests: (3) Lipid Panel (LIPID)    Cholesterol               137 mg/dL                   1-191     ATP III Classification            Desirable:  < 200 mg/dL                    Borderline High:  200 - 239 mg/dL               High:  > = 240 mg/dL   Triglycerides             122.0 mg/dL                 4.7-829.5     Normal:  <150 mg/dL     Borderline High:  621 - 199 mg/dL   HDL                  [L]  30.86 mg/dL                 >57.84   VLDL Cholesterol          24.4 mg/dL                  6.9-62.9   LDL Cholesterol           78 mg/dL                    5-28  Tests: (1) PSA, Total and Free (3515)   PSA                       0.73 ng/mL                  <=4.00     Test Methodology: Hybritech PSA   PSA, Free                 0.3 ng/mL   PSA, %Free                41 %                        > 25                              Probability of Prostate Cancer     (For Men with Non-Suspicious DRE Results and PSA Between 4 and     10 ng/mL, By Patient Age)           % free PSA                          Patient  Age                              28 to 18 Years      42 to 40 Years      0.00 - 10.00%                 56%                 55%     10.01 - 15.00%                 24%                 35%     15.01 - 20.00%                 17%                 23%     20.01 - 25.00%                 10%                 20%        > 25%                        5%                  9%Prescriptions: LOTRISONE 1-0.05 % CREA (CLOTRIMAZOLE-BETAMETHASONE) Apply a small amount to skin twice a day as needed  #45g x 3   Entered and Authorized by:   Jacques Navy MD   Signed by:   Jacques Navy MD on 04/29/2010   Method used:   Faxed to ...       [facility] (mail-order)             , [place]         Ph: [phone]       Fax: [phone]   RxID:   [id number]    Orders Added: 1)  Sleep Disorder Referral [Sleep Disorder] 2)  T-PSA Free [[address]] 3)  Zoster (Shingles) Vaccine Live [90736] 4)  Admin 1st Vaccine [90471] 5)  Est. Patient Level IV  [93235] 6)  Medicare -1st Annual Wellness Visit [G0438] 7)  Spirometry w/Graph [94010] 8)  TLB-BMP (Basic Metabolic Panel-BMET) [80048-METABOL] 9)  TLB-A1C / Hgb A1C (Glycohemoglobin) [83036-A1C] 10)  TLB-Lipid Panel [80061-LIPID]   Immunizations Administered:  Zostavax # 1:    Vaccine Type: Zostavax    Site: left arm    Mfr: Merck    Dose: 0.5 ml    Route: [place]    Exp. Date: 12/31/2010    Lot #: [id number]   Immunizations Administered:  Zostavax # 1:    Vaccine Type: Zostavax    Site: left arm    Mfr: Merck    Dose: 0.5 ml    Route: [place]    Exp. Date: 12/31/2010    Lot #: [id number]

## 2010-06-16 NOTE — Assessment & Plan Note (Signed)
Summary: sleep consult//sh   Visit Type:  Initial Consult Copy to:  Illene Regulus MD Primary Provider/Referring Provider:  Norins  CC:  Sleep Consult. Marland Kitchen  History of Present Illness: 75 yo male for sleep evaluation.  He was diagnosed with sleep apnea in October 2002.  His AHI from then was 47.  He was tried on CPAP, but could only use this 3 or 4 times.  He could never get comfortable.  As a result he stop using CPAP.  His sleep has gotten worse since his previous sleep test.  He goes to bed at 11pm, and falls asleep quick.  He is not using anything to help sleep.  He wakes up 3 or 4 times to use the bathroom.  He gets out of bed at 9am.  He feels tired in the morning, but denies headache.  He occasionally takes a nap.  He will sometimes doze off when sitting quiet.  He does not use anything to help him stay awake.  He snores, and stops breathing while asleep.  He denies sleep walking, sleep talking, bruxism, or nightmares.  There is no history of restless legs.  He denies sleep hallucinations, sleep paralysis, or cataplexy.  There is no history of depression or thyroid disease.  His weight has been steady.  He does not smoke cigarettes or drink much alcohol.  He gets occasional leg cramps at night.  His Epworth score is 11 out of 24.   Preventive Screening-Counseling & Management  Alcohol-Tobacco     Smoking Status: quit  Current Medications (verified): 1)  Hyzaar 100-25 Mg Tabs (Losartan Potassium-Hctz) .Marland Kitchen.. 1 By Mouth Once Daily 2)  Lotrisone 1-0.05 % Crea (Clotrimazole-Betamethasone) .... Apply A Small Amount To Skin Twice A Day As Needed 3)  Toprol Xl 50 Mg Tb24 (Metoprolol Succinate) .... Take 1 Tablet By Mouth Once A Day 4)  Adult Aspirin Low Strength 81 Mg  Tbdp (Aspirin) .... 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 Eden Emms                    GU             - Dr. Andee Poles- in the past.                     GI               - Dr. Dominica Severin        - Dr. Elmer Picker                    Sleep         - Dr. Craige Cotta  Family History: Reviewed history from 03/23/2008 and no changes required.  father - deceased @61 : throat and tongue cancer mother - deceased @90 : old age Neg - prostate or colon cancer;  Uncle -DM MGM - CAD/MI MGF - CAD (?) MAunt - CAD Heart disease: mother  Social History: Reviewed history from 04/28/2009 and no changes required. HSG, [place] single Work: self employed.. 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 Is willing to undergo dialysis, prolonged tube feeding.  Patient states former smoker. 1986. 2 ppd. started age 38  Review of Systems       The patient complains of joint stiffness or pain.  The patient denies shortness of breath with activity, shortness of breath at rest, productive cough, non-productive cough, coughing up blood, chest pain, irregular heartbeats, acid heartburn, indigestion, loss of appetite, weight change, abdominal pain, difficulty swallowing, sore throat, tooth/dental problems, headaches, nasal congestion/difficulty breathing through nose, sneezing, itching, ear ache, anxiety, depression, hand/feet swelling, rash,  change in color of mucus, and fever.    Vital Signs:  Patient profile:   75 year old male Height:      70 inches Weight:      282 pounds BMI:     40.61 O2 Sat:      91 % on Room air Temp:     98.1 degrees F oral Pulse rate:   72 / minute BP sitting:   116 / 80  (left arm) Cuff size:   large  Vitals Entered By: Carver Fila (May 31, 2010 3:36 PM)  O2 Flow:  Room air CC: Sleep Consult.  Comments meds and allergies updated Phone number updated Carver Fila  May 31, 2010 3:36 PM    Physical Exam  General:  normal appearance, healthy appearing, and obese.   Eyes:  PERRLA/EOM intact; conjunctiva and sclera clear Nose:  no external deformity, no external erythema, no nasal discharge, and no airflow obstruction.   Mouth:  Oral mucosa and oropharynx without lesions or exudates.  Teeth in good repair.  MP 4. Neck:  supple, full ROM, and no thyromegaly.   Lungs:  Normal respiratory effort, chest expands symmetrically. Lungs are clear to auscultation, no crackles or wheezes. Heart:  Normal rate and regular rhythm. S1 and S2 normal without gallop, murmur, click, rub or other extra sounds. Abdomen:  overweight, soft, non-tender, normal bowel sounds, no guarding, no abdominal hernia, and no hepatomegaly.   Msk:  no deformity or scoliosis noted with normal posture Pulses:  pulses normal Extremities:  No clubbing, cyanosis, edema, or deformity noted with normal full range of motion of all joints.   Neurologic:  normal CN II-XII and strength normal.   Cervical Nodes:  no significant adenopathy Psych:  alert and cooperative; normal mood and affect; normal attention span and concentration   Impression & Recommendations:  Problem # 1:  OBSTRUCTIVE SLEEP APNEA (ICD-327.23) He has prior diagnosis of sleep apnea from 2002.  He was not able to tolerate CPAP then.  He has a history of hypertension and arrhythmia.  He has worse sleep symptoms.  I am concerned he still has sleep apnea.  To  further assess this will arrange for repeat sleep test.   Explained how sleep apnea can affect his health.  Driving precautions and need for weight loss discussed.  Treatment options reviewed.  Complete Medication List: 1)  Hyzaar 100-25 Mg Tabs (Losartan potassium-hctz) .Marland Kitchen.. 1 by mouth once daily 2)  Lotrisone 1-0.05 % Crea (Clotrimazole-betamethasone) .... Apply a small amount to skin twice a day as  needed 3)  Toprol Xl 50 Mg Tb24 (Metoprolol succinate) .... Take 1 tablet by mouth once a day 4)  Adult Aspirin Low Strength 81 Mg Tbdp (Aspirin) .... Take 1 tablet by mouth once a day  Other Orders: Consultation Level IV (30865) Sleep Study (Sleep Study)  Patient Instructions: 1)  Will schedule sleep test 2)  Will call to schedule follow up after sleep test reviewed   Immunization History:  Influenza Immunization History:    Influenza:  historical (02/21/2010)

## 2010-06-16 NOTE — Progress Notes (Signed)
Summary: ALLOPURINOL RF  Phone Note From Pharmacy   [facility] Summary of Call: Pharm is req refill of allopurinol 300mg  1 once daily. Med removed from list in 2009. Please advise.  [facility] #[facility] Initial call taken by: Lamar Sprinkles, CMA,  May 25, 2010 6:58 PM  Follow-up for Phone Call        Allopurinol stopped in '09. Last uric acid level in '09 - 7.5 = minimal elevation. At last physical patient denied having any flares of gout. Will Not Refill - it has been stopped.   To be sure please call patient to see if he may per chance be takng it and not telling us at his exams.  Follow-up by: Jacques Navy MD,  May 25, 2010 10:52 PM  Additional Follow-up for Phone Call Additional follow up Details #1::        pt states he only uses it when he had a gout flare up.  Additional Follow-up by: Ami Bullins CMA,  June 02, 2010 3:42 PM    Additional Follow-up for Phone Call Additional follow up Details #2::    thanks - not an acute gout medicine.  Follow-up by: Jacques Navy MD,  June 02, 2010 5:27 PM

## 2010-06-16 NOTE — Progress Notes (Signed)
Summary: refill request  Phone Note Refill Request Message from:  Patient on February 09, 2010 10:02 AM  pt was in yesterday and metaprolol and losartin were to be sent to [facility]-checking to be sure it was sent   Method Requested: Fax to Local Pharmacy Initial call taken by: Glynda Jaeger,  February 09, 2010 10:04 AM Complaint: Headache  Follow-up for Phone Call        tired to call back pt didnot answer...Marland KitchenMarland KitchenWe havent seen this pr since 2006 so im not sure who this pt has seen..Need to speak with her inorder to fill med...  pt called stating his name was Matthew Villanueva, and that's how his name is in [place], however he has two accounts, the other listed as Matthew Villanueva which is the account to be used, msg resent using correct acct Glynda Jaeger  February 11, 2010 8:25 AM  Follow-up by: Kem Parkinson,  February 09, 2010 3:55 PM

## 2010-06-28 ENCOUNTER — Encounter: Payer: Self-pay | Admitting: Pulmonary Disease

## 2010-06-28 ENCOUNTER — Ambulatory Visit (HOSPITAL_BASED_OUTPATIENT_CLINIC_OR_DEPARTMENT_OTHER): Payer: Medicare Other | Attending: Pulmonary Disease

## 2010-06-28 DIAGNOSIS — G4733 Obstructive sleep apnea (adult) (pediatric): Secondary | ICD-10-CM | POA: Insufficient documentation

## 2010-06-28 DIAGNOSIS — I10 Essential (primary) hypertension: Secondary | ICD-10-CM | POA: Insufficient documentation

## 2010-07-01 DIAGNOSIS — G4733 Obstructive sleep apnea (adult) (pediatric): Secondary | ICD-10-CM

## 2010-07-01 DIAGNOSIS — I10 Essential (primary) hypertension: Secondary | ICD-10-CM

## 2010-07-05 ENCOUNTER — Encounter: Payer: Self-pay | Admitting: Pulmonary Disease

## 2010-07-05 ENCOUNTER — Ambulatory Visit (INDEPENDENT_AMBULATORY_CARE_PROVIDER_SITE_OTHER): Payer: Medicare Other | Admitting: Pulmonary Disease

## 2010-07-05 DIAGNOSIS — G4733 Obstructive sleep apnea (adult) (pediatric): Secondary | ICD-10-CM

## 2010-07-06 NOTE — Miscellaneous (Addendum)
Summary: Split night sleep study   Clinical Lists Changes AHI 47.4, SpO2 low 74%.  Supine AHI 91.3, Non-supine AHI 30.2.  BPAP 20/16.  Still had some events.  Will need set up and then ONO.  May need full night titration.  Will have my nurse call to schedule ROV to review study.  Appended Document: Split night sleep study pt is coming in on 2/21 at 2:15

## 2010-07-12 NOTE — Assessment & Plan Note (Signed)
Summary: discuss sleep results/ms   Copy to:  Illene Regulus MD Primary Provider/Referring Provider:  Norins  CC:  pt here to discuss sleep results. .  History of Present Illness: 75 yo male with severe OSA.  He is here for follow up after split night sleep study on 06/28/10:  AHI 47.4, SpO2 low 74%.  Supine AHI 91.3, Non-supine AHI 30.2.  BPAP 20/16.  Still had some events.     Current Medications (verified): 1)  Hyzaar 100-25 Mg Tabs (Losartan Potassium-Hctz) .Marland Kitchen.. 1 By Mouth Once Daily 2)  Lotrisone 1-0.05 % Crea (Clotrimazole-Betamethasone) .... Apply A Small Amount To Skin Twice A Day As Needed 3)  Toprol Xl 50 Mg Tb24 (Metoprolol Succinate) .... Take 1 Tablet By Mouth Once A Day 4)  Adult Aspirin Low Strength 81 Mg  Tbdp (Aspirin) .... Take 1 Tablet By Mouth Once A Day  Allergies (verified): No Known Drug Allergies  Past History:  Past Medical History: RIGHT BUNDLE BRANCH BLOCK (ICD-426.4) SUPRAVENTRICULAR TACHYCARDIA (ICD-427.89) HYPERTENSION (ICD-401.9) ARTHRITIS (ICD-716.90) Hx of DYSPNEA (ICD-786.05) LEG PAIN, RIGHT (ICD-729.5) DIABETES MELLITUS (ICD-250.00) Hx of TRIGGER FINGER (ICD-727.03) LOC OSTEOARTHROS NOT SPEC WHETHER PRIM/SEC HAND (ICD-715.34) GOUT (ICD-274.9) COLONIC POLYPS (ICD-211.3) OBSTRUCTIVE SLEEP APNEA (ICD-327.23)      - PSG 06/28/10 AHI 47.4      - BPAP 20/16 cm H2O  Physician Roster:                    Cardiology - Dr. Eden Emms                    GU             - Dr. Andee Poles- in the past.                     GI               - Dr. Dominica Severin        - Dr. Elmer Picker                    Sleep         - Dr. Craige Cotta  Past Surgical History: Reviewed history from 01/14/2009 and no changes required. Hemorrhoidectomy '60s Pilonidal cyst repair '60 Cardiac ablation '04  ablation for  SVT with a chronic right bundle-branch block  Vital Signs:  Patient profile:   75 year old male Height:      70 inches Weight:      283.38  pounds BMI:     40.81 O2 Sat:      94 % on Room air Temp:     97.6 degrees F oral Pulse rate:   75 / minute BP sitting:   114 / 72  (left arm) Cuff size:   large  Vitals Entered By: Carver Fila (July 05, 2010 2:20 PM)  O2 Flow:  Room air CC: pt here to discuss sleep results.  Comments meds and allergies updated Phone number updated  Carver Fila  July 05, 2010 2:20 PM    Physical Exam  General:  normal appearance, healthy appearing, and obese.   Nose:  no external deformity, no external erythema, no nasal discharge, and no airflow obstruction.   Mouth:  Oral mucosa and oropharynx without lesions or exudates.  Teeth in good repair.  MP 4. Neck:  supple, full  ROM, and no thyromegaly.   Lungs:  Normal respiratory effort, chest expands symmetrically. Lungs are clear to auscultation, no crackles or wheezes. Heart:  Normal rate and regular rhythm. S1 and S2 normal without gallop, murmur, click, rub or other extra sounds. Extremities:  No clubbing, cyanosis, edema, or deformity noted with normal full range of motion of all joints.   Neurologic:  normal CN II-XII.   Cervical Nodes:  no significant adenopathy   Impression & Recommendations:  Problem # 1:  OBSTRUCTIVE SLEEP APNEA (ICD-327.23)  He has severe sleep apnea.  Reviewed his sleep test with him.  Again explained how sleep apnea can affect his health.  Emphasized the importance of weight loss.  Will arrange for BPAP 20/16 and overnight oximetry on this set up.  Depending on his response he may need a repeat full night titration study.  Complete Medication List: 1)  Hyzaar 100-25 Mg Tabs (Losartan potassium-hctz) .Marland Kitchen.. 1 by mouth once daily 2)  Lotrisone 1-0.05 % Crea (Clotrimazole-betamethasone) .... Apply a small amount to skin twice a day as needed 3)  Toprol Xl 50 Mg Tb24 (Metoprolol succinate) .... Take 1 tablet by mouth once a day 4)  Adult Aspirin Low Strength 81 Mg Tbdp (Aspirin) .... Take 1 tablet by mouth once a  day  Other Orders: Est. Patient Level III (57846) DME Referral (DME)  Patient Instructions: 1)  Will set up BPAP machine at home 2)  Will arrange for oxygen test overnight while wearing BPAP 3)  Follow up in 2 months

## 2010-07-25 ENCOUNTER — Telehealth: Payer: Self-pay | Admitting: Internal Medicine

## 2010-07-29 ENCOUNTER — Encounter: Payer: Self-pay | Admitting: Internal Medicine

## 2010-07-29 ENCOUNTER — Ambulatory Visit (INDEPENDENT_AMBULATORY_CARE_PROVIDER_SITE_OTHER): Payer: Medicare Other | Admitting: Internal Medicine

## 2010-07-29 DIAGNOSIS — M109 Gout, unspecified: Secondary | ICD-10-CM

## 2010-07-29 DIAGNOSIS — I10 Essential (primary) hypertension: Secondary | ICD-10-CM

## 2010-07-29 DIAGNOSIS — E119 Type 2 diabetes mellitus without complications: Secondary | ICD-10-CM

## 2010-08-02 NOTE — Letter (Signed)
Summary: Generic Letter  [facility]  [address] [place], [place] [address]   Phone: [phone]  Fax: [phone]    07/29/2010  Matthew Villanueva [address] [place], [place]  [address]  [place]  Dear [name],      Please forward this to your Airline for   consideration of reimbursement for your recent  [employer] purchased for the flight from   [place] to [place], [place] on Jul 27, 2010.  You were not able to be present for this flight  due to acute medical illness.           Sincerely,   Oliver Barre MD

## 2010-08-02 NOTE — Progress Notes (Signed)
  Phone Note Refill Request Message from:  Fax from Pharmacy on July 25, 2010 9:39 AM     New/Updated Medications: ALLOPURINOL 100 MG TABS (ALLOPURINOL) 1 tab two times a day Prescriptions: ALLOPURINOL 100 MG TABS (ALLOPURINOL) 1 tab two times a day  #60 x 3   Entered by:   Ami Bullins CMA   Authorized by:   Jacques Navy MD   Signed by:   Bill Salinas CMA on 07/25/2010   Method used:   Electronically to        [facility]* (retail)       [address] [place], [place]  [address]       Ph: [phone]       Fax: [phone]   RxID:   [id number]

## 2010-08-02 NOTE — Assessment & Plan Note (Signed)
Summary: DR MEN PT/NO CLINIC--GOUT/FOOT---STC   Vital Signs:  Patient profile:   75 year old male Height:      70 inches Weight:      277.13 pounds BMI:     39.91 O2 Sat:      95 % on Room air Temp:     98.4 degrees F oral Pulse rate:   68 / minute BP sitting:   104 / 62  (left arm) Cuff size:   large  Vitals Entered By: Zella Ball Ewing CMA (AAMA) (July 29, 2010 2:27 PM)  O2 Flow:  Room air CC: Left foot Gout/RE   Primary Care Provider:  Norins  CC:  Left foot Gout/RE.  History of Present Illness: here to f/u -  c/o 1 wk severe pain, red, sweling to left foot first MTP , ongoing and persistent despite starting the allopurinol soon after onset;  no fever, trauma, but sweling extends to above the ankle on the left;  Pt denies CP, worsening sob, doe, wheezing, orthopnea, pnd, worsening LE edema, palps, dizziness or syncope  Pt denies new neuro symptoms such as headache, facial or extremity weakness  Pt denies polydipsia, polyuria  Overall good compliance with meds, trying to follow low chol diet, wt stable.  No recent wt loss, night sweats, loss of appetite or other constitutional symptoms   Problems Prior to Update: 1)  Acute Gouty Arthropathy  (ICD-274.01) 2)  Obstructive Sleep Apnea  (ICD-327.23) 3)  Leg Cramps  (ICD-729.82) 4)  Routine General Medical Exam@health  Care Facl  (ICD-V70.0) 5)  Hearing Loss  (ICD-389.9) 6)  Right Bundle Branch Block  (ICD-426.4) 7)  Supraventricular Tachycardia  (ICD-427.89) 8)  Hypertension  (ICD-401.9) 9)  Arthritis  (ICD-716.90) 10)  Hx of Dyspnea  (ICD-786.05) 11)  Leg Pain, Right  (ICD-729.5) 12)  Diabetes Mellitus  (ICD-250.00) 13)  Hx of Trigger Finger  (ICD-727.03) 14)  Loc Osteoarthros Not Spec Whether Prim/sec Hand  (ICD-715.34) 15)  Gout  (ICD-274.9) 16)  Colonic Polyps  (ICD-211.3)  Medications Prior to Update: 1)  Hyzaar 100-25 Mg Tabs (Losartan Potassium-Hctz) .Marland Kitchen.. 1 By Mouth Once Daily 2)  Lotrisone 1-0.05 % Crea  (Clotrimazole-Betamethasone) .... Apply A Small Amount To Skin Twice A Day As Needed 3)  Toprol Xl 50 Mg Tb24 (Metoprolol Succinate) .... Take 1 Tablet By Mouth Once A Day 4)  Adult Aspirin Low Strength 81 Mg  Tbdp (Aspirin) .... Take 1 Tablet By Mouth Once A Day 5)  Allopurinol 100 Mg Tabs (Allopurinol) .Marland Kitchen.. 1 Tab Two Times A Day  Current Medications (verified): 1)  Hyzaar 100-25 Mg Tabs (Losartan Potassium-Hctz) .Marland Kitchen.. 1 By Mouth Once Daily 2)  Lotrisone 1-0.05 % Crea (Clotrimazole-Betamethasone) .... Apply A Small Amount To Skin Twice A Day As Needed 3)  Toprol Xl 50 Mg Tb24 (Metoprolol Succinate) .... Take 1 Tablet By Mouth Once A Day 4)  Adult Aspirin Low Strength 81 Mg  Tbdp (Aspirin) .... Take 1 Tablet By Mouth Once A Day 5)  Allopurinol 100 Mg Tabs (Allopurinol) .Marland Kitchen.. 1 Tab Two Times A Day 6)  Prednisone 10 Mg Tabs (Prednisone) .... 4po Qd For 3days, Then 3po Qd For 3days, Then 2po Qd For 3days, Then 1po Qd For 3 Days, Then Stop 7)  Hydrocodone-Acetaminophen 5-325 Mg Tabs (Hydrocodone-Acetaminophen) .Marland Kitchen.. 1 By Mouth Q 6 Hrs As Needed Pain 8)  Metformin Hcl 500 Mg Xr24h-Tab (Metformin Hcl) .Marland Kitchen.. 1 By Mouth Once Daily  Allergies (verified): No Known Drug Allergies  Past History:  Past Medical  History: Last updated: 07/05/2010 RIGHT BUNDLE BRANCH BLOCK (ICD-426.4) SUPRAVENTRICULAR TACHYCARDIA (ICD-427.89) HYPERTENSION (ICD-401.9) ARTHRITIS (ICD-716.90) Hx of DYSPNEA (ICD-786.05) LEG PAIN, RIGHT (ICD-729.5) DIABETES MELLITUS (ICD-250.00) Hx of TRIGGER FINGER (ICD-727.03) LOC OSTEOARTHROS NOT SPEC WHETHER PRIM/SEC HAND (ICD-715.34) GOUT (ICD-274.9) COLONIC POLYPS (ICD-211.3) OBSTRUCTIVE SLEEP APNEA (ICD-327.23)      - PSG 06/28/10 AHI 47.4      - BPAP 20/16 cm H2O  Physician Roster:                    Cardiology - Dr. Eden Emms                    GU             - Dr. Andee Poles- in the past.                     GI               - Dr. Dominica Severin        - Dr.  Elmer Picker                    Sleep         - Dr. Craige Cotta  Past Surgical History: Last updated: 01/14/2009 Hemorrhoidectomy '60s Pilonidal cyst repair '60 Cardiac ablation '04  ablation for  SVT with a chronic right bundle-branch block  Social History: Last updated: 05/31/2010 [employer], [employer] single Work: self employed.. 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 Is willing to undergo dialysis, prolonged tube feeding.  Patient states former smoker. 1986. 2 ppd. started age 89  Risk Factors: Alcohol Use: 0 (04/29/2010) Caffeine Use: 3 cups per day (04/29/2010) Diet: regular diet - not calorie controlled (04/29/2010) Exercise: no (04/29/2010)  Risk Factors: Smoking Status: quit (05/31/2010) Passive Smoke Exposure: no (03/23/2008)  Review of Systems       all otherwise negative per pt -    Physical Exam  General:  alert and overweight-appearing.   Head:  Normocephalic and atraumatic without obvious abnormalities. No apparent alopecia or balding. Eyes:  vision grossly intact, pupils equal, and pupils round.   Ears:  R ear normal and L ear normal.   Nose:  no external deformity and no nasal discharge.   Mouth:  no gingival abnormalities and pharynx pink and moist.   Neck:  supple and no masses.   Lungs:  normal respiratory effort and normal breath sounds.   Heart:  normal rate and regular rhythm.   Extremities:  no edema, no erythema    Impression & Recommendations:  Problem # 1:  ACUTE GOUTY ARTHROPATHY (ICD-274.01)  His updated medication list for this problem includes:    Allopurinol 100 Mg Tabs (Allopurinol) .Marland Kitchen... 1 tab two times a day acute first MTP;  ok to Continue all previous medications as before this visit , but today also for depomedrol IM, and predpack for home; also note given to excuse from recen trip missed to [place];  I would check uric acid about june 1 to re-assess, consider  increase to 300 mg; or add colchicine if not improved  Orders: Depo- Medrol 40mg  (J1030) Depo- Medrol 80mg  (J1040) Admin of Therapeutic Inj  intramuscular or subcutaneous (04540)  Problem # 2:  DIABETES MELLITUS (ICD-250.00)  His updated medication list for this problem includes:  Hyzaar 100-25 Mg Tabs (Losartan potassium-hctz) .Marland Kitchen... 1 by mouth once daily    Adult Aspirin Low Strength 81 Mg Tbdp (Aspirin) .Marland Kitchen... Take 1 tablet by mouth once a day    Metformin Hcl 500 Mg Xr24h-tab (Metformin hcl) .Marland Kitchen... 1 by mouth once daily ok for metformin 500 once daily while taking the prednisone only; pt also requests repeat a1c approx june 1 as well  Labs Reviewed: Creat: 1.0 (04/29/2010)     Last Eye Exam: normal (11/01/2009) Reviewed HgBA1c results: 6.9 (04/29/2010)  6.7 (04/28/2009)  Problem # 3:  HYPERTENSION (ICD-401.9)  His updated medication list for this problem includes:    Hyzaar 100-25 Mg Tabs (Losartan potassium-hctz) .Marland Kitchen... 1 by mouth once daily    Toprol Xl 50 Mg Tb24 (Metoprolol succinate) .Marland Kitchen... Take 1 tablet by mouth once a day  BP today: 104/62 Prior BP: 114/72 (07/05/2010)  Labs Reviewed: K+: 4.3 (04/29/2010) Creat: : 1.0 (04/29/2010)   Chol: 137 (04/29/2010)   HDL: 34.90 (04/29/2010)   LDL: 78 (04/29/2010)   TG: 122.0 (04/29/2010) stable overall by hx and exam, ok to continue meds/tx as is   Complete Medication List: 1)  Hyzaar 100-25 Mg Tabs (Losartan potassium-hctz) .Marland Kitchen.. 1 by mouth once daily 2)  Lotrisone 1-0.05 % Crea (Clotrimazole-betamethasone) .... Apply a small amount to skin twice a day as needed 3)  Toprol Xl 50 Mg Tb24 (Metoprolol succinate) .... Take 1 tablet by mouth once a day 4)  Adult Aspirin Low Strength 81 Mg Tbdp (Aspirin) .... Take 1 tablet by mouth once a day 5)  Allopurinol 100 Mg Tabs (Allopurinol) .Marland Kitchen.. 1 tab two times a day 6)  Prednisone 10 Mg Tabs (Prednisone) .... 4po qd for 3days, then 3po qd for 3days, then 2po qd for 3days, then 1po qd for 3  days, then stop 7)  Hydrocodone-acetaminophen 5-325 Mg Tabs (Hydrocodone-acetaminophen) .Marland Kitchen.. 1 by mouth q 6 hrs as needed pain 8)  Metformin Hcl 500 Mg Xr24h-tab (Metformin hcl) .Marland Kitchen.. 1 by mouth once daily  Patient Instructions: 1)  you had the steroid shot today 2)  Please take all new medications as prescribed   - the prednisone and pain medication, and the diabetes med while taking the prednisone only (the metformin) 3)  Continue all previous medications as before this visit, including the allopurinol as is 4)  please return for LAB only about June 1 for: 5)  uric acid: 6)  BMP prior to visit, ICD-9: 250.02 7)  Lipid Panel prior to visit, ICD-9: 8)  HbgA1C prior to visit, ICD-9: 9)  You are given the note to the Airline today 10)  Please schedule an appointment with your primary doctor as needed Prescriptions: METFORMIN HCL 500 MG XR24H-TAB (METFORMIN HCL) 1 by mouth once daily  #30 x 0   Entered and Authorized by:   Corwin Levins MD   Signed by:   Corwin Levins MD on 07/29/2010   Method used:   Print then Give to Patient   RxID:   [id number] HYDROCODONE-ACETAMINOPHEN 5-325 MG TABS (HYDROCODONE-ACETAMINOPHEN) 1 by mouth q 6 hrs as needed pain  #30 x 0   Entered and Authorized by:   Corwin Levins MD   Signed by:   Corwin Levins MD on 07/29/2010   Method used:   Print then Give to Patient   RxID:   [id number] PREDNISONE 10 MG TABS (PREDNISONE) 4po qd for 3days, then 3po qd for 3days, then 2po qd for 3days, then 1po qd for 3  days, then stop  #30 x 0   Entered and Authorized by:   Corwin Levins MD   Signed by:   Corwin Levins MD on 07/29/2010   Method used:   Print then Give to Patient   RxID:   [id number]    Medication Administration  Injection # 1:    Medication: Depo- Medrol 40mg     Diagnosis: ACUTE GOUTY ARTHROPATHY (ICD-274.01)    Route: IM    Site: LUOQ gluteus    Exp Date: 03/2013    Lot #: [id number]    Mfr: [employer]    Comments: Patient received 120mg   Depo-medrol    Patient tolerated injection without complications    Given by: Zella Ball Ewing CMA Duncan Dull) (July 29, 2010 3:18 PM)  Injection # 2:    Medication: Depo- Medrol 80mg     Diagnosis: ACUTE GOUTY ARTHROPATHY (ICD-274.01)    Route: IM    Site: LUOQ gluteus    Exp Date: 03/2013    Lot #: [id number]    Mfr: [employer]    Given by: Zella Ball Ewing CMA Duncan Dull) (July 29, 2010 3:18 PM)  Orders Added: 1)  Depo- Medrol 40mg  [J1030] 2)  Depo- Medrol 80mg  [J1040] 3)  Admin of Therapeutic Inj  intramuscular or subcutaneous [96372] 4)  Est. Patient Level IV [84696]     Appended Document: DR MEN PT/NO CLINIC--GOUT/FOOT---STC add:  PE;  left foot first MTP with 2-3+ red, tender, swelling extending above the ankle

## 2010-08-02 NOTE — Progress Notes (Signed)
Summary: Call Report  Phone Note Other Incoming   Caller: [facility] Summary of Call: [facility] Triage Call Report Triage Record Num: [id number] Operator: Tomasita Crumble Patient Name: Matthew Villanueva Call Date & Time: 07/24/2010 9:35:28AM Patient Phone: [phone] PCP: Illene Regulus Patient Gender: Male PCP Fax : [phone] Patient DOB: December 20, 1935 Practice Name: [facility] Reason for Call: Pt. calling. States he normally gets Rx. from [facility]; asked for refill to be sent to [facility] when he had his appointment. Hx. gout onset 3/11. Karin Golden at [facility] is requested pharmacy - estimated 2 years ago. Verified in EMR Allopurinol 100 mg 2 tablets by mouth daily. Pt currently in [place] and requests Rx. to [facility]. Per standing orders called Rx. Alloprinol 100 Disp 4; sig 2 today and 2 tomorow to [facility] at [phone]. Caller instructed to follow up with office when they reopen per Medication Questions protocol. Protocol(s) Used: Medication Question Calls, No Triage (Adults) Recommended Outcome per Protocol: Provide Information or Advice Only Reason for Outcome: Caller has medication question only and triager answers question Care Advice:  ~ 07/24/2010 9:53:07AM Page 1 of 1 [id number] Initial call taken by: Margaret Pyle, CMA,  July 25, 2010 8:20 AM  Follow-up for Phone Call        ok Follow-up by: Jacques Navy MD,  July 25, 2010 9:24 AM

## 2010-08-10 ENCOUNTER — Telehealth: Payer: Self-pay | Admitting: *Deleted

## 2010-08-10 MED ORDER — ALLOPURINOL 100 MG PO TABS: 100.0000 mg | ORAL_TABLET | Freq: Every day | ORAL | Status: DC

## 2010-08-10 NOTE — Telephone Encounter (Signed)
refill 

## 2010-08-25 ENCOUNTER — Encounter: Payer: Self-pay | Admitting: Pulmonary Disease

## 2010-09-05 ENCOUNTER — Ambulatory Visit: Payer: Medicare Other | Admitting: Pulmonary Disease

## 2010-09-07 ENCOUNTER — Telehealth: Payer: Self-pay | Admitting: Pulmonary Disease

## 2010-09-07 NOTE — Telephone Encounter (Signed)
BPAP download 07/25/10 to 08/11/10.  Used on 8 of 18 nights with average 2hrs 12 min.  With BPAP 20/16 cm average AHI 1.4.

## 2010-09-12 ENCOUNTER — Encounter: Payer: Self-pay | Admitting: Pulmonary Disease

## 2010-09-14 ENCOUNTER — Ambulatory Visit (INDEPENDENT_AMBULATORY_CARE_PROVIDER_SITE_OTHER): Payer: Medicare Other | Admitting: Pulmonary Disease

## 2010-09-14 ENCOUNTER — Encounter: Payer: Self-pay | Admitting: Pulmonary Disease

## 2010-09-14 VITALS — BP 134/84 | HR 74 | Temp 98.1°F | Ht 70.0 in | Wt 278.0 lb

## 2010-09-14 DIAGNOSIS — G4733 Obstructive sleep apnea (adult) (pediatric): Secondary | ICD-10-CM

## 2010-09-14 NOTE — Progress Notes (Signed)
Subjective:    Patient ID: Matthew Villanueva, male    DOB: May 19, 1935, 75 y.o.   MRN: [id number]  HPI 75 yo male with severe OSA.   He has not been able to get used to BPAP pressures.  He has a full face mask.  He feels the pressure is too high and he has air leaking form the mask.  As a result he has not been able to use the mask for more than 2 hours in a night.  He did not have trouble with sinus congestion or mouth dryness.  Past Medical History  Diagnosis Date  . Right bundle branch block   . Other specified cardiac dysrhythmias   . HTN (hypertension)   . Arthritis   . Dyspnea   . Diabetes mellitus   . Localized osteoarthrosis not specified whether primary or secondary,  hand   . Gout   . History of colonic polyps   . OSA (obstructive sleep apnea)      Family History  Problem Relation Age of Onset  . Cancer Father     throat and tongue  . Diabetes Maternal Uncle   . Heart attack Maternal Grandmother   . Heart disease Maternal Grandfather   . Heart disease Mother      History   Social History  . Marital Status: Single    Spouse Name: N/A    Number of Children: N/A  . Years of Education: N/A   Occupational History  . self employed, Nurse, children's    Social History Main Topics  . Smoking status: Former Smoker -- 2.0 packs/day for 30 years    Types: Cigarettes    Quit date: 05/15/1984  . Smokeless tobacco: Not on file  . Alcohol Use: Not on file  . Drug Use: Not on file  . Sexually Active: Not on file   Other Topics Concern  . Not on file   Social History Narrative  . No narrative on file     No Known Allergies   Outpatient Prescriptions Prior to Visit  Medication Sig Dispense Refill  . allopurinol (ZYLOPRIM) 100 MG tablet Take 1 tablet (100 mg total) by mouth daily.  90 tablet  3  . aspirin 81 MG tablet Take 81 mg by mouth daily.        . clotrimazole-betamethasone (LOTRISONE) cream Apply 1 application topically 2 (two) times daily.        Marland Kitchen  HYDROcodone-acetaminophen (NORCO) 5-325 MG per tablet Take 1 tablet by mouth every 6 (six) hours as needed.        Marland Kitchen losartan-hydrochlorothiazide (HYZAAR) 100-25 MG per tablet Take 1 tablet by mouth daily.        . metFORMIN (GLUMETZA) 500 MG (MOD) 24 hr tablet Take 500 mg by mouth daily with breakfast.        . metoprolol (TOPROL-XL) 50 MG 24 hr tablet Take 50 mg by mouth daily.         Review of Systems    Objective:   Physical Exam Filed Vitals:   09/14/10 1633  BP: 134/84  Pulse: 74  Temp: 98.1 F (36.7 C)  TempSrc: Oral  Height: 5\' 10"  (1.778 m)  Weight: 278 lb (126.1 kg)  SpO2: 93%   General: normal appearance, healthy appearing, and obese.  Nose: no external deformity, no external erythema, no nasal discharge, and no airflow obstruction.  Mouth: Oral mucosa and oropharynx without lesions or exudates. Teeth in good repair. MP 4.  Neck: supple, full  ROM, and no thyromegaly.  Lungs: Normal respiratory effort, chest expands symmetrically. Lungs are clear to auscultation, no crackles or wheezes.  Heart: Normal rate and regular rhythm. S1 and S2 normal without gallop, murmur, click, rub or other extra sounds.  Extremities: No clubbing, cyanosis, edema, or deformity noted with normal full range of motion of all joints.  Neurologic: normal CN II-XII.  Cervical Nodes: no significant adenopathy    Assessment & Plan:   OBSTRUCTIVE SLEEP APNEA He has difficulty tolerating high settings with BPAP.  Will arrange for auto BPAP titration at home and determine if he can get away with lower pressure setting.  Will call him with results of his BPAP download.    Updated Medication List Outpatient Encounter Prescriptions as of 09/14/2010  Medication Sig Dispense Refill  . allopurinol (ZYLOPRIM) 100 MG tablet Take 1 tablet (100 mg total) by mouth daily.  90 tablet  3  . aspirin 81 MG tablet Take 81 mg by mouth daily.        . clotrimazole-betamethasone (LOTRISONE) cream Apply 1 application  topically 2 (two) times daily.        Marland Kitchen HYDROcodone-acetaminophen (NORCO) 5-325 MG per tablet Take 1 tablet by mouth every 6 (six) hours as needed.        Marland Kitchen losartan-hydrochlorothiazide (HYZAAR) 100-25 MG per tablet Take 1 tablet by mouth daily.        . metFORMIN (GLUMETZA) 500 MG (MOD) 24 hr tablet Take 500 mg by mouth daily with breakfast.        . metoprolol (TOPROL-XL) 50 MG 24 hr tablet Take 50 mg by mouth daily.

## 2010-09-14 NOTE — Assessment & Plan Note (Signed)
He has difficulty tolerating high settings with BPAP.  Will arrange for auto BPAP titration at home and determine if he can get away with lower pressure setting.  Will call him with results of his BPAP download.

## 2010-09-14 NOTE — Patient Instructions (Signed)
Will change BPAP pressure settings at home.  Will call with results of BPAP machine report. Follow up in 3 months

## 2010-09-15 ENCOUNTER — Ambulatory Visit (INDEPENDENT_AMBULATORY_CARE_PROVIDER_SITE_OTHER): Payer: Medicare Other | Admitting: Internal Medicine

## 2010-09-15 ENCOUNTER — Encounter: Payer: Self-pay | Admitting: Internal Medicine

## 2010-09-15 VITALS — BP 120/78 | HR 69 | Temp 97.5°F | Wt 273.0 lb

## 2010-09-15 DIAGNOSIS — M109 Gout, unspecified: Secondary | ICD-10-CM

## 2010-09-15 MED ORDER — ALLOPURINOL 100 MG PO TABS: 100.0000 mg | ORAL_TABLET | Freq: Every day | ORAL | Status: DC

## 2010-09-15 MED ORDER — COLCHICINE 0.6 MG PO TABS: 0.6000 mg | ORAL_TABLET | Freq: Every day | ORAL | Status: DC

## 2010-09-15 MED ORDER — ALLOPURINOL 300 MG PO TABS: 300.0000 mg | ORAL_TABLET | Freq: Every day | ORAL | Status: DC

## 2010-09-15 NOTE — Patient Instructions (Signed)
Gout Gout is an inflammatory condition (arthritis) caused by a buildup of uric acid crystals in the joints. Uric acid is a chemical that is normally present in the blood. Under some circumstances, uric acid can form into crystals in your joints. This causes joint redness, soreness, and swelling (inflammation). Repeat attacks are common. Over time, uric acid crystals can form into masses (tophi) near a joint, causing disfigurement. Gout is treatable and often preventable. CAUSES The disease begins with elevated levels of uric acid in the blood. Uric acid is produced by your body when it breaks down a naturally found substance called purines. This also happens when you eat certain foods such as meats and fish. Causes of an elevated uric acid level include:  Being passed down from parent to child (heredity).   Diseases that cause increased uric acid production (obesity, psoriasis, some cancers).   Excessive alcohol use.   Diet, especially diets rich in meat and seafood.   Medicines, including certain cancer-fighting drugs (chemotherapy), diuretics, and aspirin.   Chronic kidney disease. The kidneys are no longer able to remove uric acid well.   Problems with metabolism.  Conditions strongly associated with gout include:  Obesity.   High blood pressure.   High cholesterol.   Diabetes.  Not everyone with elevated uric acid levels gets gout. It is not understood why some people get gout and others do not. Surgery, joint injury, and eating too much of certain foods are some of the factors that can lead to gout. SYMPTOMS  An attack of gout comes on quickly. It causes intense pain with redness, swelling, and warmth in a joint.   Fever can occur.   Often, only one joint is involved. Certain joints are more commonly involved:   Base of the big toe.   Knee.   Ankle.   Wrist.   Finger.  Without treatment, an attack usually goes away in a few days to weeks. Between attacks,  you usually will not have symptoms, which is different from many other forms of arthritis. DIAGNOSIS Your caregiver will suspect gout based on your symptoms and exam. Removal of fluid from the joint (arthrocentesis) is done to check for uric acid crystals. Your caregiver will give you a medicine that numbs the area (local anesthetic) and use a needle to remove joint fluid for exam. Gout is confirmed when uric acid crystals are seen in joint fluid, using a special microscope. Sometimes, blood, urine, and X-ray tests are also used. TREATMENT There are 2 phases to gout treatment: treating the sudden onset (acute) attack and preventing attacks (prophylaxis). Treatment of an Acute Attack  Medicines are used. These include anti-inflammatory medicines or steroid medicines.   An injection of steroid medicine into the affected joint is sometimes necessary.   The painful joint is rested. Movement can worsen the arthritis.   You may use warm or cold treatments on painful joints, depending which works best for you.   Discuss the use of coffee, vitamin C, or cherries with your caregiver. These may be helpful treatment options.  Treatment to Prevent Attacks After the acute attack subsides, your caregiver may advise prophylactic medicine. These medicines either help your kidneys eliminate uric acid from your body or decrease your uric acid production. You may need to stay on these medicines for a very long time. The early phase of treatment with prophylactic medicine can be associated with an increase in acute gout attacks. For this reason, during the first few  months of treatment, your caregiver may also advise you to take medicines usually used for acute gout treatment. Be sure you understand your caregiver's directions. You should also discuss dietary treatment with your caregiver. Certain foods such as meats and fish can increase uric acid levels. Other foods such as dairy can decrease levels. Your caregiver  can give you a list of foods to avoid. HOME CARE INSTRUCTIONS  Do not take aspirin to relieve pain. This raises uric acid levels.   Only take over-the-counter or prescription medicines for pain, discomfort, or fever as directed by your caregiver.   Rest the joint as much as possible. When in bed, keep sheets and blankets off painful areas.   Keep the affected joint raised (elevated).   Use crutches if the painful joint is in your leg.   Drink enough water and fluids to keep your urine clear or pale yellow. This helps your body get rid of uric acid. Do not drink alcoholic beverages. They slow the passage of uric acid.   Follow your caregiver's dietary instructions. Pay careful attention to the amount of protein you eat. Your daily diet should emphasize fruits, vegetables, whole grains, and fat-free or low-fat milk products.   Maintain a healthy body weight.  SEEK MEDICAL CARE IF:  You have an oral temperature above 100.   You develop diarrhea, vomiting, or any side effects from medicines.   You do not feel better in 24 hours, or you are getting worse.  SEEK IMMEDIATE MEDICAL CARE IF:  Your joint becomes suddenly more tender and you have:   Chills.   An oral temperature above 100, not controlled by medicine.  MAKE SURE YOU:  Understand these instructions.   Will watch your condition.   Will get help right away if you are not doing well or get worse.  Document Released: 04/28/2000 Document Re-Released: 10/19/2009 [facility] Patient Information 2011 [place], [place].  Flare of gout: plan - colchicine 0.6mg  two tablets initially. May take an additional tablet at 60-120 minutes later if there is not adequate response to initial dose. Can repeat this the next day if needed. May also take ibuprofen 600mg  4 times a day or Aleve 2 tablets twice a day for pain. For prevention - will increase allopurinol to 300mg  daily.

## 2010-09-18 NOTE — Progress Notes (Signed)
  Subjective:    Patient ID: Tollie Eth, male    DOB: 03-17-1936, 75 y.o.   MRN: [id number]  HPI M  Mr. Thunder presents for a flare of gout involving the left great toe which became very red, swollen and painful. He has been under greater stress but denies changing his diet. He does request another copy of the low purine diet.  PMH, FamHx and SocHx reviewed for any changes and relevance.    Review of Systems 10 point review of systems is negative    Objective:   Physical Exam Overweight White male in no acute distress HEENT- nl Cor-RRR Pul- no increased work of breathing Ext - left great toe is red, warm to the touch and tender.       Assessment & Plan:  1. Gout - patient with an acute flare  Plan- colchicine 0.6mg   2 tabs with 1 tab repeat in 2 hours if needed. May repeat on successive days if needed.          NSAIDs of choice, i.e. Aleve 2 tabs bid          Increase allopurinol to 300mg  qd           Provided copy of low purine diet.

## 2010-09-27 NOTE — Assessment & Plan Note (Signed)
[facility]                            CARDIOLOGY OFFICE NOTE   NAME:Matthew Villanueva, Matthew Villanueva                     MRN:          [id number]  DATE:01/17/2008                            DOB:          Sep 13, 1935    Matthew Villanueva returns today for followup.  Matthew Villanueva has had a previous ablation for  SVT with a chronic right bundle-branch block.  Matthew Villanueva has had some  exertional dyspnea.  However, reviewing his chart, I suspect it is due  to significant weight gain.  Matthew Villanueva is gone from 268-281.  Matthew Villanueva has increased  caloric intake with lots of starches.  Matthew Villanueva is fairly inactive.   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   Matthew Villanueva has had hypertension, on good therapy with no recurrent palpitations  or tachy arrhythmias.   Outside of his exertional dyspnea, his review of systems is negative.  His last LDL cholesterol was in the 80s.   Matthew Villanueva has not had a hemoglobin A1c check.  I talked to him at length  regarding his weight and his diet.  Matthew Villanueva is at high-risk for developing  type 2 diabetes.   ALLERGIES:  Matthew Villanueva has no known allergies.   MEDICATIONS:  1. Toprol 50 a day.  2. Hyzaar 100/12.5.  3. An aspirin a day.  4. Celebrex p.r.n.   PHYSICAL EXAMINATION:  GENERAL:  Remarkable for a jovial an overweight  white male in no distress.  VITAL SIGNS:  Weight is 281, blood pressure is 140/80, pulse 61 and  regular, respiratory 14, afebrile.  HEENT:  Unremarkable.  NECK:  Carotids are normal without bruit.  No lymphadenopathy,  thyromegaly, or JVP elevation.  LUNGS:  Clear with good diaphragmatic motion.  No wheezing.  HEART:  S1 and S2 with normal heart sounds.  PMI normal.  ABDOMEN:  Protuberant.  Bowel sounds positive.  No AAA.  No tenderness.  No bruit.  No hepatosplenomegaly or hepatojugular reflux.  EXTREMITIES:  Distal pulses are intact.  No edema.  NEURO:  Nonfocal.  SKIN:  Warm and dry.  MUSCULOSKELETAL:  No muscular weakness.   EKG shows sinus rhythm with a right  bundle branch block with no acute  changes.   IMPRESSION:  1. History of supraventricular tachycardia with ablation, currently no      recurrent palpitations.  Continue low-dose beta-blocker.  2. Chronic right bundle branch block, possibly secondary to ablation.      No evidence of high-grade heart block.  Continue to follow.  3. Hypertension, currently well controlled.  Continue low-salt diet      and Hyzaar.  4. Arthritis.  Continue Celebrex.  No history of coronary artery      disease.  5. Significant weight gain.  The patient should see a nutritionist.  I      recommended [facility] Diet to him.   Matthew Villanueva will have a hemoglobin A1c checked today to make sure Matthew Villanueva is not  developing type 2 diabetes.   His prescriptions were refilled to [facility] [address] down by [employer].  Matthew Villanueva will follow with Dr.  Norins for his general medical needs  and I will see him in a year.     Noralyn Pick. Eden Emms, MD, [facility]  Electronically Signed    PCN/MedQ  DD: 01/17/2008  DT: 01/17/2008  Job #: [id number]

## 2010-09-27 NOTE — Assessment & Plan Note (Signed)
[facility]                           PRIMARY CARE OFFICE NOTE   NAME:Matthew Villanueva                     MRN:          [id number]  DATE:01/31/2007                            DOB:          1936-03-29    Matthew Villanueva is a 75 year old Caucasian gentleman, well-known to the  practice, followed for hypertension, tachycardia, history of colon  polyps.  He was last seen December 18, 2005.  In the interval, he has been  seen in cardiology for followup by Dr. Eden Emms, July 12, 2006, and  was stable at that time.   PAST MEDICAL HISTORY, FAMILY HISTORY, SOCIAL HISTORY:  Are all well-  documented in previous chart notes.   CURRENT MEDICATIONS:  1. Toprol XL 50 mg daily.  2. Hyzaar 100/12.5 mg daily.  3. Aspirin 81 mg daily.  4. Multivitamins daily.  5. Lotrisone cream as needed.   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   REVIEW OF SYSTEMS:  Patient has had slight weight-loss of approximately  3 pounds, no fevers or chills or other constitutional symptoms.  He has  had an eye exam in the last 12 months with no changes in his vision.  No  ENT or cardiovascular complaints.  He did have a stress nuclear study  April of 2008, which was negative.  No respiratory complaints.  He does  have some mild heartburn, relieved with Rolaids.  Last colonoscopy was  2006 and normal.  Patient has no GU complaints with nocturia times zero  to one.  He does admit to a slowed-down stream.  Musculoskeletal is  unremarkable, except for one flare of gout.   EXAMINATION:  Temperature was 98.3, blood pressure 137/83, pulse 61,  weight 275.  GENERAL APPEARANCE:  A well-nourished, heavy-set Caucasian male, in no  acute distress.  HEENT EXAM:  Normocephalic, atraumatic.  EACs and TMs were unremarkable.  Oropharynx with  native dentition, in good repair.  No buccal or palatal  lesions were noted.  Posterior pharynx was clear.  Conjunctiva and  sclera was clear.  PERRLA.  EOMI.  Funduscopic exam was unremarkable.  NECK:  Supple without thyromegaly.  NODES:  No adenopathy was noted in the cervical or supraclavicular  regions.  CHEST:  No CVA tenderness.  Lungs were clear to auscultation and  percussion.  CARDIOVASCULAR:  Two-plus radial pulses, no JVD or carotid bruits.  He  had a quiet precordium with regular rate and rhythm without murmurs,  rubs or gallops.  ABDOMEN:  Obese, soft, no guarding or rebound.  No organosplenomegaly  was appreciated.  RECTAL EXAM:  Normal sphincter tone was noted.  Prostate was smooth,  round, normal size and contour, without abnormality.  EXTREMITIES:  Without clubbing, cyanosis, edema or deformities noted.  NEUROLOGIC EXAM:  Nonfocal.  DERM:  Patient has what appears to be a psoriatic plaque just at the tip  of the sacrum and he has some intertriginous, erythematous rash  in the  perianal region.  No other skin lesions are noted.   LABORATORY DATA:  Ordered and pending, includes a basic metabolic panel  and PSA.  Patient did have a lipid panel, December 15, 2005, which was  unremarkable, with an LDL of 81, HDL 29.9, on no medication.  Thyroid  functions were checked August of 2007, were also normal.   IMPRESSION AND PLAN:  1. Hypertension:  Patient is well-controlled.  He will continue his      present medications.  2. Tachyarrhythmia:  Patient has been stable on his beta blocker and      has no need for additional evaluation at this time.  Patient has      had a normal stress Cardiolite.  3. Colon polyps:  Patient's last colonoscopy was in 2006, and he will      be a candidate for followup in 2013.   HEALTH MAINTENANCE:  Patient did have pneumonia vaccine in 2004, DT in  2004.   Patient is medically stable at this time.  He was asked to return to see  me in one year  or on a p.r.n. basis.     Rosalyn Gess Norins, MD  Electronically Signed    MEN/MedQ  DD: 02/01/2007  DT: 02/01/2007  Job #: [id number]   cc:   [address], [facility] [id number] Dwaine Deter

## 2010-09-30 NOTE — Assessment & Plan Note (Signed)
 [facility]                            CARDIOLOGY OFFICE NOTE   NAME:Thornell, Joycie Peek                     MRN:          [id number]  DATE:07/12/2006                            DOB:          Jan 09, 1936    [name] is seen today in followup.  He has history of an SVT ablation  in 2004.  After that he had a resultant right bundle-branch block.  He  has not had any significant recurrent palpitations or syncope.  He has  chronic hypertension that is well treated.   Unfortunately, his weight continues to be an issue.  He likes to eat a  little bit too much and is somewhat sedentary.   He has been on Toprol-XL and Hyzaar for quite some time and his blood  pressure has been under good control.  We discussed low salt diet in  regards to his high blood pressure.   He has not had any significant chest pain.  He stopped smoking  many  years ago.  We discussed the fact that he had multiple coronary risk  factors and probably needed a followup Myoview.  We will try to do this  in the next month.  I think he should probably have one every 4-5 years.   REVIEW OF SYSTEMS:  Otherwise negative.  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   EXAMINATION:  GENERAL:  He is overweight.  The blood pressure is 128/85.  HEENT:  Normal.  Carotids are normal without bruit.  LUNGS:  Clear.  HEART:  There is an S1, S2 with normal heart sounds.  ABDOMEN:  Benign.  There is no hepatosplenomegaly, no AAA.  Distal  pulses are intact, no edema.  NEUROLOGIC:  Nonfocal.  SKIN:  Warm and dry.   His EKG shows sinus rhythm with chronic right bundle-branch block.   IMPRESSION:  1. Stable.  2. History of supraventricular tachycardia status post ablation      without recurrent palpitations.  3. Chronic right bundle-branch block, no evidence of high-grade block.      Continue Toprol-XL and Hyzaar.  4. Hypertension.  Followup Myoview in the  next few weeks.  If      negative, he can follow up on a p.r.n. basis and primarily see Dr.      Debby Bud for his risk factor modification.    Noralyn Pick. Eden Emms, MD, [facility]  Electronically Signed   PCN/MedQ  DD: 07/12/2006  DT: 07/12/2006  Job #: [id number]

## 2010-09-30 NOTE — Assessment & Plan Note (Signed)
[facility]                             PRIMARY CARE OFFICE NOTE   NAME:Francois, Joycie Peek                     MRN:          [id number]  DATE:12/18/2005                            DOB:          1935-08-04    HISTORY OF PRESENT ILLNESS:  Mr. Mcsweeney is a very pleasant 75 year old  gentleman who presents for follow up of hypertension and rash.  He was last  seen November 09, 2005, for removal of skin tags.  His interval history has  otherwise been unremarkable.   PAST SURGICAL HISTORY:  1.  Hemorrhoidectomy in 1960's.  2.  Pilonidal cyst repair in the 1960's.   PAST MEDICAL HISTORY:  1.  Usual childhood diseases.  2.  Whooping cough.  3.  Tachyarrhythmia since age 74.  4.  Hypertension.  5.  History of colon polyps.  6.  Intermittent paresthesia to the left thigh.   PHYSICIANS:  1.  Noralyn Pick. Eden Emms, MD, [facility], Cardiology.  2.  Judie Petit T. Russella Dar, MD, [facility], GI.  3.  Dr. Sol Passer, Ophthalmology.  4.  [facility], MD, Urology.   CURRENT MEDICATIONS:  1.  Toprol XL 50 mg daily.  2.  Hyzaar 100/12.5 one daily.  3.  Aspirin 81 mg daily.  4.  Multivitamin daily.   CHART REVIEW:  The patient had UP study at [facility] in  2005.  The patient had arrest stress Cardiolite study November 25, 2002, read  out as normal with no evidence of ischemia with no ST segment changes.  EF  was estimated at 63%.   Last colonoscopy June 08, 2004, notable for angiodysplasia, intestinal;  internal hemorrhoids were noted.  The patient had lower extremity arterial  Doppler's performed September 2002 which showed ABI's of 1.0.  Last course  sponsored by [employer] [facility], MD, dating from May 06, 2003 and  evaluation for microhematuria which was a negative evaluation.   The patient had abdominal CT as part of his hematuria workup May 12, 2003, which revealed inguinal hernias only, but no other abnormalities  noted.   Foot x-rays  August 06, 2002, with stable appearance of left foot with  posterior calcaneal spur.  Last chest x-ray Oct 13, 1998, was unremarkable.   REVIEW OF SYSTEMS:  Negative for Constitutional, Cardiovascular,  Respiratory, GI or GU complaints.   PHYSICAL EXAMINATION:  VITAL SIGNS:  Temperature 97.6, blood pressure  135/75, pulse 60, weight 278.  GENERAL:  This is an overweight Caucasian male in no acute distress.  HEENT:  Normocephalic, atraumatic.  EAC's and TM's were unremarkable.  Bilateral cerumen impaction which were irrigated without difficulty.  Oropharynx with negative dentition, in good repair.  Conjunctivae and  sclerae were clear.  Pupils equal, round and reactive to light and  accommodation.  Funduscopic examination was unremarkable.  NECK:  Supple without thyromegaly.  LYMPH NODES:  No adenopathy was noted in the cervical or supraclavicular  regions.  CHEST:  No CVA tenderness.  LUNGS:  Clear to auscultation and percussion.  CARDIOVASCULAR:  Radial pulses 2+.  No JVD or carotid bruits.  Had a quite  precordium with regular rate and rhythm without murmurs, rubs or gallops.  ABDOMEN:  Soft, no guarding or rebound.  No organosplenomegaly was noted.  GENITALIA:  Unremarkable.  RECTAL:  Normal sphincter tone __________ normal size and contour without  nodules.  EXTREMITIES:  Without cyanosis, clubbing or edema.  NEUROLOGICAL:  Nonfocal.   DATABASE:  Hemoglobin 15 and 24 g, white count 9800 with normal  differential.  Chemistries with serum glucose of 111.  Kidney function  normal with creatinine of 1.1, estimated GFR was normal at 71 ml per minute.  Liver functions were normal.  Cholesterol was 139, triglycerides 142, HDL  29.9, LDL 81, TSH normal at 2.81, PSA was normal at 0.68, urinalysis was  negative.   ASSESSMENT/PLAN:  1.  Hypertension.  The patients' blood pressure is adequately controlled on      his present medical regimen.  He will continue the same.  2.  Rash. On  examination, the patient did have a patch of erythematous scaly      rash in the coccygeal region.  Plan:  The patient was given a refill of      his Lotrisone to use as needed.  3.  Cerumen impaction.  The patient's ears were irrigated without      difficulty.  4.  Health maintenance:  The patient is currently up to date with colorectal      cancer screening.  PSA was normal.  He has been evaluated for      microhematuria, and he is cardiac stable.   SUMMARY:  This is a pleasant gentleman whom does seem adequately stable at  this time.  I have asked him to return to see me in one year on a p.r.n.  basis.                                   Rosalyn Gess Norins, MD   MEN/MedQ  DD:  12/19/2005  DT:  12/19/2005  Job #:  [id number]   cc:   Dwaine Deter

## 2011-01-02 ENCOUNTER — Encounter: Payer: Self-pay | Admitting: Pulmonary Disease

## 2011-01-02 ENCOUNTER — Ambulatory Visit (INDEPENDENT_AMBULATORY_CARE_PROVIDER_SITE_OTHER): Payer: Medicare Other | Admitting: Pulmonary Disease

## 2011-01-02 VITALS — BP 110/70 | HR 67 | Temp 98.0°F | Ht 71.0 in | Wt 283.6 lb

## 2011-01-02 DIAGNOSIS — G4733 Obstructive sleep apnea (adult) (pediatric): Secondary | ICD-10-CM

## 2011-01-02 NOTE — Progress Notes (Signed)
Subjective:    Patient ID: Matthew Villanueva, male    DOB: 1936/05/03, 75 y.o.   MRN: [id number]  HPI 75 yo male with severe OSA.   He was not able to tolerate BPAP.  He could not get comfortable with the mask or pressure.  He was not using the machine for enough hours, and therefore the machine was removed by his DME.  He continues to have trouble with his sleep.  Past Medical History  Diagnosis Date  . Right bundle branch block   . Other specified cardiac dysrhythmias   . HTN (hypertension)   . Arthritis   . Dyspnea   . Diabetes mellitus   . Localized osteoarthrosis not specified whether primary or secondary,  hand   . Gout   . History of colonic polyps   . OSA (obstructive sleep apnea)      Family History  Problem Relation Age of Onset  . Cancer Father     throat and tongue  . Diabetes Maternal Uncle   . Heart attack Maternal Grandmother   . Heart disease Maternal Grandfather   . Heart disease Mother      History   Social History  . Marital Status: Single    Spouse Name: N/A    Number of Children: N/A  . Years of Education: N/A   Occupational History  . self employed, Nurse, children's    Social History Main Topics  . Smoking status: Former Smoker -- 2.0 packs/day for 30 years    Types: Cigarettes    Quit date: 05/15/1984  . Smokeless tobacco: Not on file  . Alcohol Use: Not on file  . Drug Use: Not on file  . Sexually Active: Not on file   Other Topics Concern  . Not on file   Social History Narrative  . No narrative on file     No Known Allergies   Review of Systems     Objective:   Physical Exam BP 110/70  Pulse 67  Temp(Src) 98 F (36.7 C) (Oral)  Ht 5\' 11"  (1.803 m)  Wt 283 lb 9.6 oz (128.64 kg)  BMI 39.55 kg/m2  SpO2 94%  General: normal appearance, healthy appearing, and obese.  Nose: no external deformity, no external erythema, no nasal discharge, and no airflow obstruction.  Mouth: Oral mucosa and oropharynx without lesions or  exudates. Teeth in good repair. MP 4.  Neck: supple, full ROM, and no thyromegaly.  Lungs: Normal respiratory effort, chest expands symmetrically. Lungs are clear to auscultation, no crackles or wheezes.  Heart: Normal rate and regular rhythm. S1 and S2 normal without gallop, murmur, click, rub or other extra sounds.  Extremities: No clubbing, cyanosis, edema, or deformity noted with normal full range of motion of all joints.  Neurologic: normal CN II-XII.  Cervical Nodes: no significant adenopathy     Assessment & Plan:   OBSTRUCTIVE SLEEP APNEA He has been intolerant of CPAP/BPAP.  Will refer to Dr. Althea Grimmer to assess for oral appliance (mandibular advancement device) to treat obstructive sleep apnea.  Advised that he will need to have follow up testing after fitting with oral appliance.    Updated Medication List Outpatient Encounter Prescriptions as of 01/02/2011  Medication Sig Dispense Refill  . aspirin 81 MG tablet Take 81 mg by mouth daily.        . clotrimazole-betamethasone (LOTRISONE) cream Apply 1 application topically 2 (two) times daily as needed.       Marland Kitchen losartan-hydrochlorothiazide (HYZAAR) 100-25  MG per tablet Take 1 tablet by mouth daily.        . metoprolol (TOPROL-XL) 50 MG 24 hr tablet Take 50 mg by mouth daily.        . Multiple Vitamin (MULTIVITAMIN) tablet Take 1 tablet by mouth daily.        Marland Kitchen DISCONTD: allopurinol (ZYLOPRIM) 100 MG tablet Take 1 tablet (100 mg total) by mouth daily.  90 tablet  3  . DISCONTD: allopurinol (ZYLOPRIM) 300 MG tablet Take 1 tablet (300 mg total) by mouth daily.  90 tablet  3  . DISCONTD: colchicine 0.6 MG tablet Take 1 tablet (0.6 mg total) by mouth daily.  12 tablet  3  . DISCONTD: HYDROcodone-acetaminophen (NORCO) 5-325 MG per tablet Take 1 tablet by mouth every 6 (six) hours as needed.        Marland Kitchen DISCONTD: metFORMIN (GLUMETZA) 500 MG (MOD) 24 hr tablet Take 500 mg by mouth daily with breakfast.

## 2011-01-02 NOTE — Assessment & Plan Note (Addendum)
He has been intolerant of CPAP/BPAP.  Will refer to Dr. Althea Grimmer to assess for oral appliance (mandibular advancement device) to treat obstructive sleep apnea.  Advised that he will need to have follow up testing after fitting with oral appliance.

## 2011-01-02 NOTE — Patient Instructions (Signed)
Will refer to Dr. Althea Grimmer to assess for oral appliance (mandibular advancement device) to treat obstructive sleep apnea. Follow up in 4 months

## 2011-01-17 ENCOUNTER — Other Ambulatory Visit: Payer: Medicare Other

## 2011-02-03 ENCOUNTER — Other Ambulatory Visit: Payer: Self-pay | Admitting: Cardiovascular Disease

## 2011-03-27 ENCOUNTER — Encounter: Payer: Self-pay | Admitting: Endocrinology

## 2011-03-27 ENCOUNTER — Ambulatory Visit (INDEPENDENT_AMBULATORY_CARE_PROVIDER_SITE_OTHER): Payer: Medicare Other | Admitting: Endocrinology

## 2011-03-27 DIAGNOSIS — H6120 Impacted cerumen, unspecified ear: Secondary | ICD-10-CM

## 2011-03-27 NOTE — Patient Instructions (Signed)
Please call dr Debby Bud of your eye or ear symptoms persist.

## 2011-03-27 NOTE — Progress Notes (Signed)
  Subjective:    Patient ID: Matthew Villanueva, male    DOB: Mar 10, 1936, 75 y.o.   MRN: [id number]  HPI Pt states 1 week of slight redness of the right eye, but no assoc visual loss.  He says it may be getting better now Past Medical History  Diagnosis Date  . Right bundle branch block   . Other specified cardiac dysrhythmias   . HTN (hypertension)   . Arthritis   . Dyspnea   . Diabetes mellitus   . Localized osteoarthrosis not specified whether primary or secondary,  hand   . Gout   . History of colonic polyps   . OSA (obstructive sleep apnea)     Past Surgical History  Procedure Date  . Hemorrhoid surgery 1960  . Pilonidal cyst drainage   . Cardiac electrophysiology study and ablation 2004    History   Social History  . Marital Status: Single    Spouse Name: N/A    Number of Children: N/A  . Years of Education: N/A   Occupational History  . self employed, Nurse, children's    Social History Main Topics  . Smoking status: Former Smoker -- 2.0 packs/day for 30 years    Types: Cigarettes    Quit date: 05/15/1984  . Smokeless tobacco: Not on file  . Alcohol Use: Not on file  . Drug Use: Not on file  . Sexually Active: Not on file   Other Topics Concern  . Not on file   Social History Narrative  . No narrative on file    Current Outpatient Prescriptions on File Prior to Visit  Medication Sig Dispense Refill  . aspirin 81 MG tablet Take 81 mg by mouth daily.        . clotrimazole-betamethasone (LOTRISONE) cream Apply 1 application topically 2 (two) times daily as needed.       Marland Kitchen losartan-hydrochlorothiazide (HYZAAR) 100-25 MG per tablet TAKE 1 TABLET ONCE DAILY  90 tablet  0  . metoprolol (TOPROL-XL) 50 MG 24 hr tablet TAKE 1 TABLET ONCE A DAY  90 tablet  0  . Multiple Vitamin (MULTIVITAMIN) tablet Take 1 tablet by mouth daily.          No Known Allergies  Family History  Problem Relation Age of Onset  . Cancer Father     throat and tongue  . Diabetes  Maternal Uncle   . Heart attack Maternal Grandmother   . Heart disease Maternal Grandfather   . Heart disease Mother     BP 134/76  Pulse 67  Temp(Src) 97.9 F (36.6 C) (Oral)  Ht 5\' 10"  (1.778 m)  Wt 285 lb (129.275 kg)  BMI 40.89 kg/m2  SpO2 94%    Review of Systems He has few days of decreased hearing from both ears.  No earache    Objective:   Physical Exam VITAL SIGNS:  See vs page GENERAL: no distress Both tm's are occluded with cerumen. Left eye: normal except for slight conjunctival injection.   Intervention: both eac's are occluded with cerumen, and are irrigated.  Repeat exam is normal    Assessment & Plan:  Cerumen impaction, new Conjunctivitis, resolving

## 2011-03-29 DIAGNOSIS — H6120 Impacted cerumen, unspecified ear: Secondary | ICD-10-CM | POA: Insufficient documentation

## 2011-04-18 ENCOUNTER — Encounter: Payer: Self-pay | Admitting: Endocrinology

## 2011-04-18 ENCOUNTER — Ambulatory Visit (INDEPENDENT_AMBULATORY_CARE_PROVIDER_SITE_OTHER): Payer: Medicare Other | Admitting: Endocrinology

## 2011-04-18 VITALS — BP 134/72 | HR 81 | Temp 98.1°F | Ht 70.0 in | Wt 282.4 lb

## 2011-04-18 DIAGNOSIS — J209 Acute bronchitis, unspecified: Secondary | ICD-10-CM

## 2011-04-18 MED ORDER — AZITHROMYCIN 500 MG PO TABS: 500.0000 mg | ORAL_TABLET | Freq: Every day | ORAL | Status: AC

## 2011-04-18 MED ORDER — PROMETHAZINE-CODEINE 6.25-10 MG/5ML PO SYRP: 5.0000 mL | ORAL_SOLUTION | ORAL | Status: AC | PRN

## 2011-04-18 NOTE — Patient Instructions (Addendum)
i have sent a prescription to your pharmacy, for an antibiotic. Loratadine-d (non-prescription) will help your congestion. Here is a prescription for cough medication.   Please see dr Debby Bud as scheduled next month for your physical.

## 2011-04-18 NOTE — Progress Notes (Signed)
Subjective:    Patient ID: Matthew Villanueva, male    DOB: Jan 20, 1936, 75 y.o.   MRN: [id number]  HPI Pt states 3 days of moderate prod-quality cough in the chest, and assoc nasal congestion.  He says he feels somewhat better today.   Past Medical History  Diagnosis Date  . Right bundle branch block   . Other specified cardiac dysrhythmias   . HTN (hypertension)   . Arthritis   . Dyspnea   . Diabetes mellitus   . Localized osteoarthrosis not specified whether primary or secondary,  hand   . Gout   . History of colonic polyps   . OSA (obstructive sleep apnea)     Past Surgical History  Procedure Date  . Hemorrhoid surgery 1960  . Pilonidal cyst drainage   . Cardiac electrophysiology study and ablation 2004    ablation for SVT with a chronic right bundle branch block     History   Social History  . Marital Status: Single    Spouse Name: N/A    Number of Children: N/A  . Years of Education: 16   Occupational History  . self employed, Nurse, children's   .     Social History Main Topics  . Smoking status: Former Smoker -- 2.0 packs/day for 30 years    Types: Cigarettes    Quit date: 05/15/1984  . Smokeless tobacco: Not on file  . Alcohol Use: Not on file  . Drug Use: Not on file  . Sexually Active: Not on file   Other Topics Concern  . Not on file   Social History Narrative   [place], [place]: self employed...sales fabricI-ADLsEnd-of-Life: Yes-CPR, Yes-short-term Mechanical Ventilation, but no prolonged ventilation. Is willing to undergo dialysis, prolonged tub feeding.Pt states former smoker. 1986. 2ppd. Started age 48    Current Outpatient Prescriptions on File Prior to Visit  Medication Sig Dispense Refill  . aspirin 81 MG tablet Take 81 mg by mouth daily.        . clotrimazole-betamethasone (LOTRISONE) cream Apply 1 application topically 2 (two) times daily as needed.       Marland Kitchen losartan-hydrochlorothiazide (HYZAAR) 100-25 MG per tablet TAKE 1  TABLET ONCE DAILY  90 tablet  0  . metoprolol (TOPROL-XL) 50 MG 24 hr tablet TAKE 1 TABLET ONCE A DAY  90 tablet  0  . Multiple Vitamin (MULTIVITAMIN) tablet Take 1 tablet by mouth daily.          No Known Allergies  Family History  Problem Relation Age of Onset  . Cancer Father     throat and tongue  . Diabetes Maternal Uncle   . Heart attack Maternal Grandmother   . Heart disease Maternal Grandmother     CAD/MI  . Heart disease Maternal Grandfather   . Heart disease Mother   . Heart disease Maternal Aunt     CAD    BP 134/72  Pulse 81  Temp(Src) 98.1 F (36.7 C) (Oral)  Ht 5\' 10"  (1.778 m)  Wt 282 lb 6.4 oz (128.096 kg)  BMI 40.52 kg/m2  SpO2 92%    Review of Systems Denies fever, sob, and earache    Objective:   Physical Exam VITAL SIGNS:  See vs page GENERAL: no distress head: no deformity eyes: no periorbital swelling, no proptosis external nose and ears are normal mouth: no lesion seen Both eac's and tm's are normal LUNGS:  Clear to auscultation     Assessment & Plan:  Acute bronchitis, new

## 2011-05-19 ENCOUNTER — Other Ambulatory Visit: Payer: Self-pay | Admitting: Internal Medicine

## 2011-05-19 MED ORDER — METOPROLOL SUCCINATE ER 50 MG PO TB24: ORAL_TABLET | ORAL | Status: DC

## 2011-05-19 MED ORDER — LOSARTAN POTASSIUM-HCTZ 100-25 MG PO TABS: ORAL_TABLET | ORAL | Status: DC

## 2011-05-19 NOTE — Telephone Encounter (Signed)
Requesting new rx to be sent to mail order: [facility]).  Toprol 50 mg (generic) and Losartan (generic) 25mg . This is a new mail order company for him.  His legal name is Matthew Villanueva and has been changed in the record.

## 2011-05-19 NOTE — Telephone Encounter (Signed)
Rx's sent to [facility], pt informed.

## 2011-05-19 NOTE — Telephone Encounter (Signed)
Ok for Rx to [facility].

## 2011-05-23 ENCOUNTER — Other Ambulatory Visit (INDEPENDENT_AMBULATORY_CARE_PROVIDER_SITE_OTHER): Payer: Medicare Other

## 2011-05-23 ENCOUNTER — Ambulatory Visit (INDEPENDENT_AMBULATORY_CARE_PROVIDER_SITE_OTHER): Payer: Medicare Other | Admitting: Internal Medicine

## 2011-05-23 VITALS — BP 138/84 | HR 65 | Temp 97.7°F | Wt 283.0 lb

## 2011-05-23 DIAGNOSIS — E119 Type 2 diabetes mellitus without complications: Secondary | ICD-10-CM

## 2011-05-23 DIAGNOSIS — M109 Gout, unspecified: Secondary | ICD-10-CM

## 2011-05-23 DIAGNOSIS — Z0000 Encounter for general adult medical examination without abnormal findings: Secondary | ICD-10-CM

## 2011-05-23 DIAGNOSIS — Z136 Encounter for screening for cardiovascular disorders: Secondary | ICD-10-CM

## 2011-05-23 DIAGNOSIS — I10 Essential (primary) hypertension: Secondary | ICD-10-CM

## 2011-05-23 DIAGNOSIS — G4733 Obstructive sleep apnea (adult) (pediatric): Secondary | ICD-10-CM

## 2011-05-23 DIAGNOSIS — M19049 Primary osteoarthritis, unspecified hand: Secondary | ICD-10-CM

## 2011-05-23 DIAGNOSIS — H9190 Unspecified hearing loss, unspecified ear: Secondary | ICD-10-CM

## 2011-05-23 LAB — COMPREHENSIVE METABOLIC PANEL
ALT: 30 U/L (ref 0–53)
AST: 22 U/L (ref 0–37)
Albumin: 3.9 g/dL (ref 3.5–5.2)
CO2: 29 mEq/L (ref 19–32)
Calcium: 9 mg/dL (ref 8.4–10.5)
Chloride: 100 mEq/L (ref 96–112)
Creatinine, Ser: 1 mg/dL (ref 0.4–1.5)
GFR: 79.63 mL/min (ref >60.00)
Potassium: 4 mEq/L (ref 3.5–5.1)

## 2011-05-23 NOTE — Progress Notes (Signed)
Subjective:    Patient ID: Matthew Villanueva, male    DOB: [birth year], 76 y.o.   MRN: [id number]  HPI [name] presentos for general medical follow-up. He has been struggling with OSA. He cannot tolerated CPAP. He has been fitted for an appliance by Dr. Althea Grimmer but he has not accomodated to it. He does follow-up with Dr. Craige Cotta. He has not had any luck with weight loss. He has had success in the past with weight watchers.   He has otherwise been doing well. He has his gout under control.Gving up beef seems to have reduced the flare.  The patient is here for annual Medicare wellness examination and management of other chronic and acute problems.   The risk factors are reflected in the social history.  The roster of all physicians providing medical care to patient - is listed in the Snapshot section of the chart.  Activities of daily living:  The patient is 100% inedpendent in all ADLs: dressing, toileting, feeding as well as independent mobility  Home safety : The patient has smoke detectors in the home. They wear seatbelts. No firearms at home. Has made home fall-safe: grab bars in the shower, removed small items of furniture.There is no violence in the home.   There is no risks for hepatitis, STDs or HIV. There is no   history of blood transfusion. They have no travel history to infectious disease endemic areas of the world.  The patient has seen their dentist in the last six month. They have seen their eye doctor in the last year. They admit to hearing difficulty and has had audiologic testing in the last year.  They do not  have excessive sun exposure. Discussed the need for sun protection: hats, long sleeves and use of sunscreen if there is significant sun exposure.   Diet: the importance of a healthy diet is discussed. They do have a healthy diet.  The patient has no regular exercise program.  The benefits of regular aerobic exercise were discussed.  Depression screen: there are  no signs or vegative symptoms of depression- irritability, change in appetite, anhedonia, sadness/tearfullness.  Cognitive assessment: the patient manages all their financial and personal affairs and is actively engaged.   The following portions of the patient's history were reviewed and updated as appropriate: allergies, current medications, past family history, past medical history,  past surgical history, past social history  and problem list.  Vision, hearing, body mass index were assessed and reviewed.   During the course of the visit the patient was educated and counseled about appropriate screening and preventive services including : fall prevention , diabetes screening, nutrition counseling, colorectal cancer screening, and recommended immunizations.   Past Medical History  Diagnosis Date  . Right bundle branch block   . Orthodromic reciprocating tachycardia   . HTN (hypertension)   . Arthritis   . Dyspnea   . Diabetes mellitus   . Localized osteoarthrosis not specified whether primary or secondary,  hand   . Gout   . History of colonic polyps   . OSA (obstructive sleep apnea)    Past Surgical History  Procedure Date  . Hemorrhoid surgery 1960  . Pilonidal cyst drainage   . Cardiac electrophysiology study and ablation 2004    ablation for SVT with a chronic right bundle branch block    Family History  Problem Relation Age of Onset  . Cancer Father     throat and tongue  . Diabetes Maternal Uncle   .  Heart attack Maternal Grandmother   . Heart disease Maternal Grandmother     CAD/MI  . Heart disease Maternal Grandfather   . Heart disease Mother   . Heart disease Maternal Aunt     CAD   History   Social History  . Marital Status: Single    Spouse Name: N/A    Number of Children: N/A  . Years of Education: 16   Occupational History  . self employed, Nurse, children's   .     Social History Main Topics  . Smoking status: Former Smoker -- 2.0 packs/day for 30  years    Types: Cigarettes    Quit date: 05/15/1984  . Smokeless tobacco: Not on file  . Alcohol Use: Not on file  . Drug Use: Not on file  . Sexually Active: Not on file   Other Topics Concern  . Not on file   Social History Narrative   [place], [place]Work: self employed...sales fabricI-ADLsEnd-of-Life: Yes-CPR, Yes-short-term Mechanical Ventilation, but no prolonged ventilation. Is willing to undergo dialysis, prolonged tub feeding.Pt states former smoker. 1986. 2ppd. Started age 3       Review of Systems Constitutional:  Negative for fever, chills, activity change and unexpected weight change.  HEENT:  Negative for hearing loss, ear pain, congestion, neck stiffness and postnasal drip. Negative for sore throat or swallowing problems. Negative for dental complaints.   Eyes: Negative for vision loss or change in visual acuity.  Respiratory: Negative for chest tightness and wheezing. Negative for DOE.   Cardiovascular: Negative for chest pain or palpitations. No decreased exercise tolerance Gastrointestinal: No change in bowel habit. No bloating or gas. No reflux or indigestion Genitourinary: Negative for urgency, frequency, flank pain and difficulty urinating.  Musculoskeletal: Negative for myalgias, back pain, arthralgias and gait problem.  Neurological: Negative for dizziness, tremors, weakness and headaches.  Hematological: Negative for adenopathy.  Psychiatric/Behavioral: Negative for behavioral problems and dysphoric mood.        Objective:   Physical Exam Vital signs reviewed Gen'l: Well nourished well developed,obese white male in no acute distress  HEENT: Head: Normocephalic and atraumatic. Right Ear: External ear normal. EAC with some cerumen/TM nl. Left Ear: External ear normal.  EAC/TM nl. Nose: Nose normal. Mouth/Throat: Oropharynx is clear and moist. Dentition - native, in good repair. No buccal or palatal lesions. Posterior pharynx clear. Eyes:  Conjunctivae and sclera clear. EOM intact. Pupils are equal, round, and reactive to light. Right eye exhibits no discharge. Left eye exhibits no discharge. Neck: Normal range of motion. Neck supple. No JVD present. No tracheal deviation present. No thyromegaly present.  Cardiovascular: Normal rate, regular rhythm, no gallop, no friction rub, no murmur heard.      Quiet precordium. 2+ radial and DP pulses . No carotid bruits Pulmonary/Chest: Effort normal. No respiratory distress or increased WOB, no wheezes, no rales. No chest wall deformity or CVAT. Mild gynecomastia due to weight Abdominal: Soft. Bowel sounds are normal in all quadrants. He exhibits no distension, no tenderness, no rebound or guarding, No heptosplenomegaly  Genitourinary:  deferred Musculoskeletal: Normal range of motion. He exhibits no edema and no tenderness.       Small and large joints without redness, synovial thickening or deformity. Full range of motion preserved about all small, median and large joints.  Lymphadenopathy:    He has no cervical or supraclavicular adenopathy.  Neurological: He is alert and oriented to person, place, and time. CN II-XII intact. DTRs 2+ and symmetrical biceps, radial  and patellar tendons. Cerebellar function normal with no tremor, rigidity, normal gait and station.  Skin: Skin is warm and dry. No rash noted. No erythema.  Psychiatric: He has a normal mood and affect. His behavior is normal. Thought content normal.   Lab Results  Component Value Date   WBC 10.5 01/31/2007   HGB 15.8 01/31/2007   HCT 45.5 01/31/2007   PLT 277 01/31/2007   GLUCOSE 203* 05/23/2011   CHOL 137 04/29/2010   TRIG 122.0 04/29/2010   HDL 34.90* 04/29/2010   LDLCALC 78 04/29/2010   ALT 30 05/23/2011   AST 22 05/23/2011   NA 137 05/23/2011   K 4.0 05/23/2011   CL 100 05/23/2011   CREATININE 1.0 05/23/2011   BUN 14 05/23/2011   CO2 29 05/23/2011   TSH 2.17 01/31/2007   PSA 0.73 04/29/2010   HGBA1C 8.0* 05/23/2011             Assessment & Plan:

## 2011-05-24 DIAGNOSIS — Z0000 Encounter for general adult medical examination without abnormal findings: Secondary | ICD-10-CM | POA: Insufficient documentation

## 2011-05-24 MED ORDER — METFORMIN HCL 500 MG PO TABS: 500.0000 mg | ORAL_TABLET | Freq: Two times a day (BID) | ORAL | Status: DC

## 2011-05-24 NOTE — Assessment & Plan Note (Signed)
Primary c/o is knee pain but this is not limiting his activities.

## 2011-05-24 NOTE — Assessment & Plan Note (Signed)
Admits to hearing loss but to a point where he has trouble socially or with work.  Plan- audiology and consideration of amplification when he is ready.

## 2011-05-24 NOTE — Assessment & Plan Note (Addendum)
A1C @ 8% is above goal of 7%. Currently on no medication.  Plan - continue life-style management, especially weight loss           Start metformin 500 mg bid (Rx sent to [facility])           F/u A1C in 3 months

## 2011-05-24 NOTE — Assessment & Plan Note (Signed)
Still struggling: intolerant of CPAP and having difficulty with oral appliance.  Plan - per Dr. Shelle Iron           Weight loss

## 2011-05-24 NOTE — Assessment & Plan Note (Signed)
No recent flares which the patient attributes to reducing intake of red meat. Uric Acid 5.7 - normal range.  Plan - continue present dietary management

## 2011-05-24 NOTE — Assessment & Plan Note (Signed)
Discussed the role that obesity plays in all his chronic disease: obstructive sleep apnea, hypertension, diabetes, gout. Target - BMI 30. He does recognize the importance of this issue. He understands that he can see benefit even before reaching his target.  Plan - weight management: smart food choices, PORTION SIZE CONTROL - hand as a guide, exercise-3 times a week for 30 minutes with heart rate of 120-130           "Weight Watchers" is a very good program - he has done this before.            Goal - to loose 1-2 lbs per month

## 2011-05-24 NOTE — Assessment & Plan Note (Signed)
BP Readings from Last 3 Encounters:  05/23/11 138/84  04/18/11 134/72  03/27/11 134/76   Adequate control. Will see improvement with weight loss

## 2011-05-24 NOTE — Assessment & Plan Note (Signed)
Interval medical history significant for continued sleep problems. No major illness, injury or surgery. Physical exam is normal except for girth. Previous labs reviewed - good cholesterol control and thyroid is normal thus no need to repeat. Current lab reviewed - normal uric acid, chemistries and kidney function, diabetes out of control. He is current with colorectal cancer screening with last exam in '06. Immunizations are current and up to date.  In summary - a nice man who is medically stable but needs to loose weight and to gain better control of diabetes. He is encouraged to join weight watchers, to follow a calorie controlled sugar free, low carb diet and to exercise. He will return for follow-up lab in early April '13

## 2011-05-31 ENCOUNTER — Emergency Department (HOSPITAL_COMMUNITY)
Admission: EM | Admit: 2011-05-31 | Discharge: 2011-05-31 | Disposition: A | Discharge status: Home / Self Care | Payer: No Typology Code available for payment source | Attending: Emergency Medicine | Admitting: Emergency Medicine

## 2011-05-31 ENCOUNTER — Encounter (HOSPITAL_COMMUNITY): Payer: Self-pay

## 2011-05-31 ENCOUNTER — Emergency Department (HOSPITAL_COMMUNITY): Payer: No Typology Code available for payment source

## 2011-05-31 DIAGNOSIS — Z862 Personal history of diseases of the blood and blood-forming organs and certain disorders involving the immune mechanism: Secondary | ICD-10-CM | POA: Insufficient documentation

## 2011-05-31 DIAGNOSIS — R6884 Jaw pain: Secondary | ICD-10-CM | POA: Insufficient documentation

## 2011-05-31 DIAGNOSIS — I10 Essential (primary) hypertension: Secondary | ICD-10-CM | POA: Insufficient documentation

## 2011-05-31 DIAGNOSIS — Z7982 Long term (current) use of aspirin: Secondary | ICD-10-CM | POA: Insufficient documentation

## 2011-05-31 DIAGNOSIS — G4733 Obstructive sleep apnea (adult) (pediatric): Secondary | ICD-10-CM | POA: Insufficient documentation

## 2011-05-31 DIAGNOSIS — Z8639 Personal history of other endocrine, nutritional and metabolic disease: Secondary | ICD-10-CM | POA: Insufficient documentation

## 2011-05-31 DIAGNOSIS — E119 Type 2 diabetes mellitus without complications: Secondary | ICD-10-CM | POA: Insufficient documentation

## 2011-05-31 DIAGNOSIS — M542 Cervicalgia: Secondary | ICD-10-CM | POA: Insufficient documentation

## 2011-05-31 DIAGNOSIS — R609 Edema, unspecified: Secondary | ICD-10-CM | POA: Insufficient documentation

## 2011-05-31 DIAGNOSIS — Z79899 Other long term (current) drug therapy: Secondary | ICD-10-CM | POA: Insufficient documentation

## 2011-05-31 DIAGNOSIS — M129 Arthropathy, unspecified: Secondary | ICD-10-CM | POA: Insufficient documentation

## 2011-05-31 DIAGNOSIS — M1990 Unspecified osteoarthritis, unspecified site: Secondary | ICD-10-CM | POA: Insufficient documentation
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
## 2011-05-31 NOTE — ED Notes (Signed)
Called placed to pt friend Freida Busman  [phone]

## 2011-05-31 NOTE — ED Notes (Signed)
C Coller removed by Dr Preston Fleeting

## 2011-05-31 NOTE — ED Notes (Signed)
Pt returns from radiology. 

## 2011-05-31 NOTE — ED Notes (Signed)
Pt transported to radiology.
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
## 2011-06-19 ENCOUNTER — Telehealth: Payer: Self-pay

## 2011-06-19 MED ORDER — COLCHICINE 0.6 MG PO TABS: 1.2000 mg | ORAL_TABLET | Freq: Every day | ORAL | Status: DC

## 2011-06-19 NOTE — Telephone Encounter (Signed)
Unable to inform pt, VM is full

## 2011-06-19 NOTE — Telephone Encounter (Signed)
Pt called requesting refill of Colchine for Gout flare.

## 2011-06-19 NOTE — Telephone Encounter (Signed)
Ok colchicine 0.6 mg, 2 tabs for gout, repeat 1 tab 2 hrs later if needed.  # 12, refill x 3

## 2011-06-21 ENCOUNTER — Encounter: Payer: Self-pay | Admitting: Gastroenterology

## 2011-08-21 ENCOUNTER — Ambulatory Visit (INDEPENDENT_AMBULATORY_CARE_PROVIDER_SITE_OTHER): Payer: Medicare Other | Admitting: Internal Medicine

## 2011-08-21 ENCOUNTER — Encounter: Payer: Self-pay | Admitting: Internal Medicine

## 2011-08-21 ENCOUNTER — Other Ambulatory Visit (INDEPENDENT_AMBULATORY_CARE_PROVIDER_SITE_OTHER): Payer: Medicare Other

## 2011-08-21 VITALS — BP 108/78 | HR 84 | Temp 97.3°F | Resp 16 | Wt 266.0 lb

## 2011-08-21 DIAGNOSIS — E119 Type 2 diabetes mellitus without complications: Secondary | ICD-10-CM

## 2011-08-21 DIAGNOSIS — I10 Essential (primary) hypertension: Secondary | ICD-10-CM

## 2011-08-21 DIAGNOSIS — E66813 Obesity, class 3: Secondary | ICD-10-CM

## 2011-08-21 DIAGNOSIS — M109 Gout, unspecified: Secondary | ICD-10-CM

## 2011-08-21 DIAGNOSIS — M19049 Primary osteoarthritis, unspecified hand: Secondary | ICD-10-CM

## 2011-08-21 LAB — COMPREHENSIVE METABOLIC PANEL
Albumin: 4 g/dL (ref 3.5–5.2)
BUN: 15 mg/dL (ref 6–23)
CO2: 27 mEq/L (ref 19–32)
Calcium: 9 mg/dL (ref 8.4–10.5)
Chloride: 104 mEq/L (ref 96–112)
GFR: 83.5 mL/min (ref >60.00)
Glucose, Bld: 125 mg/dL — ABNORMAL HIGH (ref 70–99)
Potassium: 4.1 mEq/L (ref 3.5–5.1)

## 2011-08-21 LAB — LIPID PANEL
Cholesterol: 127 mg/dL (ref 0–200)
Triglycerides: 99 mg/dL (ref 0.0–149.0)

## 2011-08-21 LAB — URIC ACID: Uric Acid, Serum: 7.5 mg/dL (ref 4.0–7.8)

## 2011-08-21 MED ORDER — COLCHICINE 0.6 MG PO TABS: ORAL_TABLET | ORAL | Status: DC

## 2011-08-21 MED ORDER — METOPROLOL SUCCINATE ER 50 MG PO TB24: 50.0000 mg | ORAL_TABLET | Freq: Every day | ORAL | Status: DC

## 2011-08-21 MED ORDER — LOSARTAN POTASSIUM-HCTZ 100-25 MG PO TABS: 1.0000 | ORAL_TABLET | Freq: Every day | ORAL | Status: DC

## 2011-08-21 NOTE — Patient Instructions (Signed)
Right arm pain - a normal exam with good joint motion and no radicular (pinched nerve) findings. Most likely mild nerve root irritation. Plan - otc NSAIDs, e.g ibuprofen 400-600 mg (2-3 tabs) 3 times a day or naproxen sodium 220 to 440 mg twice a day ( 1 or 2 tabs) for pain. Watch for GI irritation. For progressive pain or loss of feeling or weakness will repeat C-Spine x-rays as a first step.  Gout - will recheck uric acid level. Next time you have an inflammed joint we may want to do an aspiration to make a definitive diagnosis.  Diabetes - will check the A1C today. Continue your life style treatment - no sugar,low carb diet and continue metformin.  GREAT work on the [employer] - keep it up.  Wt Readings from Last 3 Encounters:  08/21/11 266 lb (120.657 kg)  05/23/11 283 lb (128.368 kg)  04/18/11 282 lb 6.4 oz (128.096 kg)    Degenerative Disc Disease Degenerative disc disease is a condition caused by the changes that occur in the cushions of the backbone (spinal discs) as you grow older. Spinal discs are soft and compressible discs located between the bones of the spine (vertebrae). They act like shock absorbers. Degenerative disc disease can affect the wholespine. However, the neck and lower back are most commonly affected. Many changes can occur in the spinal discs with aging, such as:  The spinal discs may dry and shrink.   Small tears may occur in the tough, outer covering of the disc (annulus).   The disc space may become smaller due to loss of water.   Abnormal growths in the bone (spurs) may occur. This can put pressure on the nerve roots exiting the spinal canal, causing pain.   The spinal canal may become narrowed.  CAUSES   Degenerative disc disease is a condition caused by the changes that occur in the spinal discs with aging. The exact cause is not known, but there is a genetic basis for many patients. Degenerative changes can occur due to loss of fluid in the disc.  This makes the disc thinner and reduces the space between the backbones. Small cracks can develop in the outer layer of the disc. This can lead to the breakdown of the disc. You are more likely to get degenerative disc disease if you are overweight. Smoking cigarettes and doing heavy work such as weightlifting can also increase your risk of this condition. Degenerative changes can start after a sudden injury. Growth of bone spurs can compress the nerve roots and cause pain.   SYMPTOMS   The symptoms vary from person to person. Some people may have no pain, while others have severe pain. The pain may be so severe that it can limit your activities. The location of the pain depends on the part of your backbone that is affected. You will have neck or arm pain if a disc in the neck area is affected. You will have pain in your back, buttocks, or legs if a disc in the lower back is affected. The pain becomes worse while bending, reaching up, or with twisting movements. The pain may start gradually and then get worse as time passes. It may also start after a major or minor injury. You may feel numbness or tingling in the arms or legs.   DIAGNOSIS   Your caregiver will ask you about your symptoms and about activities or habits that may cause the pain. He or she may also ask about  any injuries, diseases, ortreatments you have had earlier. Your caregiver will examine you to check for the range of movement that is possible in the affected area, to check for strength in your extremities, and to check for sensation in the areas of the arms and legs supplied by different nerve roots. An X-ray of the spine may be taken. Your caregiver may suggest other imaging tests, such as a computerized magnetic scan (MRI), if needed.   TREATMENT   Treatment includes rest, modifying your activities, and applying ice and heat. Your caregiver may prescribe medicines to reduce your pain and may ask you to do some exercises to strengthen your  back. In some cases, you may need surgery. You and your caregiver will decide on the treatment that is best for you. HOME CARE INSTRUCTIONS    Follow proper lifting and walking techniques as advised by your caregiver.   Maintain good posture.   Exercise regularly as advised.   Perform relaxation exercises.   Change your sitting, standing, and sleeping habits as advised. Change positions frequently.   Lose weight as advised.   Stop smoking if you smoke.   Wear supportive footwear.  SEEK MEDICAL CARE IF:   The pain does not go away within 1 to 4 weeks. SEEK IMMEDIATE MEDICAL CARE IF:    The pain is severe.   You notice weakness in your arms, hands, or legs.   You begin to lose control of your bladder or bowel.  MAKE SURE YOU:    Understand these instructions.   Will watch your condition.   Will get help right away if you are not doing well or get worse.  Document Released: 02/26/2007 Document Revised: 04/20/2011 Document Reviewed: 02/26/2007 [facility] Patient Information 2012 [place], [place].
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
## 2011-08-22 NOTE — Assessment & Plan Note (Signed)
BP Readings from Last 3 Encounters:  08/21/11 108/78  05/31/11 145/75  05/23/11 138/84   Much better control - secondary to weight management and continued medical therapy.

## 2011-08-22 NOTE — Assessment & Plan Note (Signed)
Weight down 17 lbs since January!!  Plan - continue this excellent program of weight management

## 2011-08-22 NOTE — Assessment & Plan Note (Signed)
He has lost weight via Weight Watchers and a sugar free diet. Last A1C 8%.  Plan - continue weight management            Continue medication - metformin            Lab today - recommendations to follow.

## 2011-08-22 NOTE — Assessment & Plan Note (Signed)
Question of gout vs OA.He has had relief in the past with Colchicine. Previous Uric Acid level of 5.7 (normal)  Plan - uric acid level today with recommendations to follow.           At next joint flare OV for joint aspiration recommended

## 2011-08-23 ENCOUNTER — Other Ambulatory Visit: Payer: Self-pay

## 2011-08-24 ENCOUNTER — Encounter: Payer: Self-pay | Admitting: Internal Medicine

## 2011-08-24 LAB — HEMOGLOBIN A1C: Hgb A1c MFr Bld: 6.5 % (ref 4.6–6.5)

## 2011-08-25 ENCOUNTER — Telehealth: Payer: Self-pay | Admitting: *Deleted

## 2011-08-25 MED ORDER — COLCHICINE 0.6 MG PO TABS: 1.2000 mg | ORAL_TABLET | Freq: Every day | ORAL | Status: DC

## 2011-08-25 NOTE — Telephone Encounter (Signed)
Left msg on vm received e-script for colchicine 0.6 mg. Want to know can we change quanity to 180 received for ony # 10. Called [facility] spoke with pharmacist Diego Cory ok for # 180.... 08/25/11@9 :58am/LMB

## 2011-10-02 ENCOUNTER — Ambulatory Visit (INDEPENDENT_AMBULATORY_CARE_PROVIDER_SITE_OTHER): Payer: Medicare Other | Admitting: Endocrinology

## 2011-10-02 ENCOUNTER — Encounter: Payer: Self-pay | Admitting: Endocrinology

## 2011-10-02 VITALS — BP 122/74 | HR 74 | Temp 97.2°F | Ht 70.0 in | Wt 259.0 lb

## 2011-10-02 DIAGNOSIS — J069 Acute upper respiratory infection, unspecified: Secondary | ICD-10-CM

## 2011-10-02 MED ORDER — CEFUROXIME AXETIL 250 MG PO TABS: 250.0000 mg | ORAL_TABLET | Freq: Two times a day (BID) | ORAL | Status: AC

## 2011-10-02 MED ORDER — PROMETHAZINE-CODEINE 6.25-10 MG/5ML PO SYRP: 5.0000 mL | ORAL_SOLUTION | ORAL | Status: AC | PRN

## 2011-10-02 NOTE — Patient Instructions (Addendum)
Here are 2 prescriptions: antibiotic and cough syrup. I hope you feel better soon.  If you don't feel better by next week, please call back.   

## 2011-10-02 NOTE — Progress Notes (Signed)
Subjective:    Patient ID: Matthew Villanueva, male    DOB: Sep 22, 1935, 76 y.o.   MRN: [id number]  HPI Pt states a few days of dry-quality cough, and assoc nasal congestion. Past Medical History  Diagnosis Date  . Right bundle branch block   . Orthodromic reciprocating tachycardia   . HTN (hypertension)   . Arthritis   . Dyspnea   . Diabetes mellitus   . Localized osteoarthrosis not specified whether primary or secondary, hand   . Gout   . History of colonic polyps   . OSA (obstructive sleep apnea)     Past Surgical History  Procedure Date  . Hemorrhoid surgery 1960  . Pilonidal cyst drainage   . Cardiac electrophysiology study and ablation 2004    ablation for SVT with a chronic right bundle branch block     History   Social History  . Marital Status: Single    Spouse Name: N/A    Number of Children: N/A  . Years of Education: 16   Occupational History  . self employed, Nurse, children's   .     Social History Main Topics  . Smoking status: Former Smoker -- 2.0 packs/day for 30 years    Types: Cigarettes    Quit date: 05/15/1984  . Smokeless tobacco: Not on file  . Alcohol Use: Not on file  . Drug Use: Not on file  . Sexually Active: Not on file   Other Topics Concern  . Not on file   Social History Narrative   [place], [place]: self employed...sales fabricI-ADLsEnd-of-Life: Yes-CPR, Yes-short-term Mechanical Ventilation, but no prolonged ventilation. Is willing to undergo dialysis, prolonged tub feeding.Pt states former smoker. 1986. 2ppd. Started age 34    Current Outpatient Prescriptions on File Prior to Visit  Medication Sig Dispense Refill  . aspirin 81 MG tablet Take 81 mg by mouth daily.        . clotrimazole-betamethasone (LOTRISONE) cream Apply 1 application topically 2 (two) times daily as needed. For dry skin      . colchicine (COLCRYS) 0.6 MG tablet Take 2 tablets [1.2 mg total] by mouth daily. Repeat 1 tablet [2] two hours later  if needed.  10 tablet  2  . colchicine 0.6 MG tablet Take 2 tablets (1.2 mg total) by mouth daily. Repeat 1 tablet 2 hrs later if needed  180 tablet  0  . losartan-hydrochlorothiazide (HYZAAR) 100-25 MG per tablet Take 1 tablet by mouth daily.  90 tablet  3  . metFORMIN (GLUCOPHAGE) 500 MG tablet Take 500 mg by mouth 2 (two) times daily with a meal.      . metoprolol succinate (TOPROL-XL) 50 MG 24 hr tablet Take 1 tablet (50 mg total) by mouth daily. Take with or immediately following a meal.  90 tablet  3  . Multiple Vitamin (MULITIVITAMIN WITH MINERALS) TABS Take 1 tablet by mouth daily.        No Known Allergies  Family History  Problem Relation Age of Onset  . Cancer Father     throat and tongue  . Diabetes Maternal Uncle   . Heart attack Maternal Grandmother   . Heart disease Maternal Grandmother     CAD/MI  . Heart disease Maternal Grandfather   . Heart disease Mother   . Heart disease Maternal Aunt     CAD    BP 122/74  Pulse 74  Temp(Src) 97.2 F (36.2 C) (Oral)  Ht 5\' 10"  (1.778 m)  Wt 259 lb (  117.482 kg)  BMI 37.16 kg/m2  SpO2 94%    Review of Systems He has sneezing and rhinorrhea    Objective:   Physical Exam VITAL SIGNS:  See vs page GENERAL: no distress head: no deformity eyes: no periorbital swelling, no proptosis external nose and ears are normal mouth: no lesion seen Both tm's are red LUNGS:  Clear to auscultation.       Assessment & Plan:  URI, new

## 2011-10-23 ENCOUNTER — Telehealth: Payer: Self-pay | Admitting: Internal Medicine

## 2011-10-23 NOTE — Telephone Encounter (Signed)
Pt has a bad cold and cough.  He would like to be worked in.

## 2011-10-23 NOTE — Telephone Encounter (Signed)
May be added on to Tuesday schedule.  Please do not just leave this type of msg on my desktop - it may not be seen until late in the day. Please bring it to my attention for expedited handling.

## 2011-10-24 ENCOUNTER — Ambulatory Visit (INDEPENDENT_AMBULATORY_CARE_PROVIDER_SITE_OTHER): Payer: Medicare Other | Admitting: Internal Medicine

## 2011-10-24 ENCOUNTER — Encounter: Payer: Self-pay | Admitting: Internal Medicine

## 2011-10-24 VITALS — BP 100/64 | HR 60 | Temp 98.4°F | Resp 16 | Ht 70.0 in | Wt 262.0 lb

## 2011-10-24 DIAGNOSIS — J069 Acute upper respiratory infection, unspecified: Secondary | ICD-10-CM

## 2011-10-24 MED ORDER — PROMETHAZINE-CODEINE 6.25-10 MG/5ML PO SYRP: 5.0000 mL | ORAL_SOLUTION | ORAL | Status: AC | PRN

## 2011-10-24 MED ORDER — DOXYCYCLINE HYCLATE 100 MG PO TABS: 100.0000 mg | ORAL_TABLET | Freq: Two times a day (BID) | ORAL | Status: AC

## 2011-10-24 MED ORDER — BENZONATATE 100 MG PO CAPS: 100.0000 mg | ORAL_CAPSULE | Freq: Three times a day (TID) | ORAL | Status: AC

## 2011-10-24 NOTE — Telephone Encounter (Signed)
Patient is Scheduled to see Dr. Debby Bud @ 1pm today 10-24-11

## 2011-10-24 NOTE — Patient Instructions (Signed)
Recurrent Upper respiratory infection with no evidence of pneumonia. Plan : doxycyline twice a day for 10 days - a different class of antibiotics, excellent for this; promethazine with codeine 1 tsp every 6 hours for cough; tessalon perles twice a day for 10 days for cough; mucinex 1200 mg twice a day - to thin secretions and make them easier to clear; hydrate; tylenol 1,000 mg three times a day on schedule for 3 days and then as needed. Call for persistent fever or unresolved symptoms.   Upper Respiratory Infection, Adult An upper respiratory infection (URI) is also sometimes known as the common cold. The upper respiratory tract includes the nose, sinuses, throat, trachea, and bronchi. Bronchi are the airways leading to the lungs. Most people improve within 1 week, but symptoms can last up to 2 weeks. A residual cough may last even longer.   CAUSES Many different viruses can infect the tissues lining the upper respiratory tract. The tissues become irritated and inflamed and often become very moist. Mucus production is also common. A cold is contagious. You can easily spread the virus to others by oral contact. This includes kissing, sharing a glass, coughing, or sneezing. Touching your mouth or nose and then touching a surface, which is then touched by another person, can also spread the virus. SYMPTOMS   Symptoms typically develop 1 to 3 days after you come in contact with a cold virus. Symptoms vary from person to person. They may include:  Runny nose.   Sneezing.   Nasal congestion.   Sinus irritation.   Sore throat.   Loss of voice (laryngitis).   Cough.   Fatigue.   Muscle aches.   Loss of appetite.   Headache.   Low-grade fever.  DIAGNOSIS   You might diagnose your own cold based on familiar symptoms, since most people get a cold 2 to 3 times a year. Your caregiver can confirm this based on your exam. Most importantly, your caregiver can check that your symptoms are not due  to another disease such as strep throat, sinusitis, pneumonia, asthma, or epiglottitis. Blood tests, throat tests, and X-rays are not necessary to diagnose a common cold, but they may sometimes be helpful in excluding other more serious diseases. Your caregiver will decide if any further tests are required. RISKS AND COMPLICATIONS   You may be at risk for a more severe case of the common cold if you smoke cigarettes, have chronic heart disease (such as heart failure) or lung disease (such as asthma), or if you have a weakened immune system. The very young and very old are also at risk for more serious infections. Bacterial sinusitis, middle ear infections, and bacterial pneumonia can complicate the common cold. The common cold can worsen asthma and chronic obstructive pulmonary disease (COPD). Sometimes, these complications can require emergency medical care and may be life-threatening. PREVENTION   The best way to protect against getting a cold is to practice good hygiene. Avoid oral or hand contact with people with cold symptoms. Wash your hands often if contact occurs. There is no clear evidence that vitamin C, vitamin E, echinacea, or exercise reduces the chance of developing a cold. However, it is always recommended to get plenty of rest and practice good nutrition. TREATMENT   Treatment is directed at relieving symptoms. There is no cure. Antibiotics are not effective, because the infection is caused by a virus, not by bacteria. Treatment may include:  Increased fluid intake. Sports drinks offer valuable electrolytes, sugars, and  fluids.   Breathing heated mist or steam (vaporizer or shower).   Eating chicken soup or other clear broths, and maintaining good nutrition.   Getting plenty of rest.   Using gargles or lozenges for comfort.   Controlling fevers with ibuprofen or acetaminophen as directed by your caregiver.   Increasing usage of your inhaler if you have asthma.  Zinc gel and zinc  lozenges, taken in the first 24 hours of the common cold, can shorten the duration and lessen the severity of symptoms. Pain medicines may help with fever, muscle aches, and throat pain. A variety of non-prescription medicines are available to treat congestion and runny nose. Your caregiver can make recommendations and may suggest nasal or lung inhalers for other symptoms.   HOME CARE INSTRUCTIONS    Only take over-the-counter or prescription medicines for pain, discomfort, or fever as directed by your caregiver.   Use a warm mist humidifier or inhale steam from a shower to increase air moisture. This may keep secretions moist and make it easier to breathe.   Drink enough water and fluids to keep your urine clear or pale yellow.   Rest as needed.   Return to work when your temperature has returned to normal or as your caregiver advises. You may need to stay home longer to avoid infecting others. You can also use a face mask and careful hand washing to prevent spread of the virus.  SEEK MEDICAL CARE IF:    After the first few days, you feel you are getting worse rather than better.   You need your caregiver's advice about medicines to control symptoms.   You develop chills, worsening shortness of breath, or brown or red sputum. These may be signs of pneumonia.   You develop yellow or brown nasal discharge or pain in the face, especially when you bend forward. These may be signs of sinusitis.   You develop a fever, swollen neck glands, pain with swallowing, or white areas in the back of your throat. These may be signs of strep throat.  SEEK IMMEDIATE MEDICAL CARE IF:    You have a fever.   You develop severe or persistent headache, ear pain, sinus pain, or chest pain.   You develop wheezing, a prolonged cough, cough up blood, or have a change in your usual mucus (if you have chronic lung disease).   You develop sore muscles or a stiff neck.  Document Released: 10/25/2000 Document  Revised: 04/20/2011 Document Reviewed: 09/02/2010 [facility] Patient Information 2012 [place], [place].

## 2011-10-24 NOTE — Progress Notes (Signed)
  Subjective:    Patient ID: Matthew Villanueva, male    DOB: 08/11/1935, 76 y.o.   MRN: [id number]  HPI Matthew Villanueva was seen May 20th by Dr. Sherrilyn Rist diagnosed with URI and treated with ceftin 250 mg bid x 7. He reports his symptoms cleared but as soon as he finished antibiotics his symptoms returned: Rhinorrhea of purulent mucus, cough and feeling feverish. No sinus pressure, no SOB, no N/V/D. He has felt ill with these symptoms.  Past Medical History  Diagnosis Date  . Right bundle branch block   . Other specified cardiac dysrhythmias   . HTN (hypertension)   . Arthritis   . Dyspnea   . Diabetes mellitus   . Localized osteoarthrosis not specified whether primary or secondary, hand   . Gout   . History of colonic polyps   . OSA (obstructive sleep apnea)    Past Surgical History  Procedure Date  . Hemorrhoid surgery 1960  . Pilonidal cyst drainage   . Cardiac electrophysiology study and ablation 2004    ablation for SVT with a chronic right bundle branch block    Family History  Problem Relation Age of Onset  . Cancer Father     throat and tongue  . Diabetes Maternal Uncle   . Heart attack Maternal Grandmother   . Heart disease Maternal Grandmother     CAD/MI  . Heart disease Maternal Grandfather   . Heart disease Mother   . Heart disease Maternal Aunt     CAD   History   Social History  . Marital Status: Single    Spouse Name: N/A    Number of Children: N/A  . Years of Education: 16   Occupational History  . self employed, Nurse, children's   .     Social History Main Topics  . Smoking status: Former Smoker -- 2.0 packs/day for 30 years    Types: Cigarettes    Quit date: 05/15/1984  . Smokeless tobacco: Not on file  . Alcohol Use: Not on file  . Drug Use: Not on file  . Sexually Active: Not on file   Other Topics Concern  . Not on file   Social History Narrative   [place], [place]: self employed...sales fabricI-ADLsEnd-of-Life: Yes-CPR,  Yes-short-term Mechanical Ventilation, but no prolonged ventilation. Is willing to undergo dialysis, prolonged tub feeding.Pt states former smoker. 1986. 2ppd. Started age 26        Review of Systems System review is negative for any constitutional, cardiac, pulmonary, GI or neuro symptoms or complaints other than as described in the HPI.     Objective:   Physical Exam Filed Vitals:   10/24/11 1332  BP: 100/64  Pulse: 60  Temp: 98.4 F (36.9 C)  Resp: 16   Gen'l heavyset white man in no distress HEENT - No tenderness to percussion over the frontal and maxillary sinus Neck - supple Nodes - negative Cor- RRR Chest - good breath sounds, wet rhonchi noted w/o wheezing        Assessment & Plan:  URI with cough  - has relapsed after a course of ceftin.  Plan - doxycyline 100 mg bid x 10 days  tessalone perles  Promethazine/code  Supportive care.

## 2011-12-13 ENCOUNTER — Ambulatory Visit (INDEPENDENT_AMBULATORY_CARE_PROVIDER_SITE_OTHER): Payer: Medicare Other | Admitting: Internal Medicine

## 2011-12-13 ENCOUNTER — Other Ambulatory Visit (INDEPENDENT_AMBULATORY_CARE_PROVIDER_SITE_OTHER): Payer: Medicare Other

## 2011-12-13 ENCOUNTER — Encounter: Payer: Self-pay | Admitting: Internal Medicine

## 2011-12-13 VITALS — BP 118/78 | HR 71 | Temp 98.0°F | Resp 16 | Wt 254.0 lb

## 2011-12-13 DIAGNOSIS — E119 Type 2 diabetes mellitus without complications: Secondary | ICD-10-CM

## 2011-12-13 DIAGNOSIS — M109 Gout, unspecified: Secondary | ICD-10-CM

## 2011-12-13 DIAGNOSIS — L309 Dermatitis, unspecified: Secondary | ICD-10-CM

## 2011-12-13 DIAGNOSIS — I10 Essential (primary) hypertension: Secondary | ICD-10-CM

## 2011-12-13 LAB — COMPREHENSIVE METABOLIC PANEL
AST: 22 U/L (ref 0–37)
BUN: 19 mg/dL (ref 6–23)
Calcium: 9.5 mg/dL (ref 8.4–10.5)
Chloride: 101 mEq/L (ref 96–112)
Creatinine, Ser: 1 mg/dL (ref 0.4–1.5)
Total Bilirubin: 0.7 mg/dL (ref 0.3–1.2)

## 2011-12-13 LAB — MICROALBUMIN / CREATININE URINE RATIO
Creatinine,U: 104.5 mg/dL
Microalb, Ur: 4 mg/dL — ABNORMAL HIGH (ref 0.0–1.9)

## 2011-12-13 NOTE — Progress Notes (Signed)
Subjective:    Patient ID: Matthew Villanueva, male    DOB: [birth year], 76 y.o.   MRN: [id number]  HPI Matthew Villanueva presents for follow up of diabetes and obesity. He has been participating in [employer] and has lost 30 lbs!!!! He has been walking for the last few days with plans to keep this up. He has been feeling better.  He has a pruritic rash at the posterior neck at the hair line. This has been present for some time.   Past Medical History  Diagnosis Date  . Right bundle branch block   . Other specified cardiac dysrhythmias   . HTN (hypertension)   . Arthritis   . Dyspnea   . Diabetes mellitus   . Localized osteoarthrosis not specified whether primary or secondary, hand   . Gout   . History of colonic polyps   . OSA (obstructive sleep apnea)    Past Surgical History  Procedure Date  . Hemorrhoid surgery 1960  . Pilonidal cyst drainage   . Cardiac electrophysiology study and ablation 2004    ablation for SVT with a chronic right bundle branch block    Family History  Problem Relation Age of Onset  . Cancer Father     throat and tongue  . Diabetes Maternal Uncle   . Heart attack Maternal Grandmother   . Heart disease Maternal Grandmother     CAD/MI  . Heart disease Maternal Grandfather   . Heart disease Mother   . Heart disease Maternal Aunt     CAD   History   Social History  . Marital Status: Single    Spouse Name: N/A    Number of Children: N/A  . Years of Education: 16   Occupational History  . self employed, Nurse, children's   .     Social History Main Topics  . Smoking status: Former Smoker -- 2.0 packs/day for 30 years    Types: Cigarettes    Quit date: 05/15/1984  . Smokeless tobacco: Not on file  . Alcohol Use: Not on file  . Drug Use: Not on file  . Sexually Active: Not on file   Other Topics Concern  . Not on file   Social History Narrative   [place], [place]: self employed...sales fabricI-ADLsEnd-of-Life: Yes-CPR,  Yes-short-term Mechanical Ventilation, but no prolonged ventilation. Is willing to undergo dialysis, prolonged tub feeding.Pt states former smoker. 1986. 2ppd. Started age 89    Current Outpatient Prescriptions on File Prior to Visit  Medication Sig Dispense Refill  . aspirin 81 MG tablet Take 81 mg by mouth daily.        . clotrimazole-betamethasone (LOTRISONE) cream Apply 1 application topically 2 (two) times daily as needed. For dry skin      . colchicine (COLCRYS) 0.6 MG tablet Take 2 tablets [1.2 mg total] by mouth daily. Repeat 1 tablet [2] two hours later if needed.  10 tablet  2  . losartan-hydrochlorothiazide (HYZAAR) 100-25 MG per tablet Take 1 tablet by mouth daily.  90 tablet  3  . metFORMIN (GLUCOPHAGE) 500 MG tablet Take 500 mg by mouth 2 (two) times daily with a meal.      . metoprolol succinate (TOPROL-XL) 50 MG 24 hr tablet Take 1 tablet (50 mg total) by mouth daily. Take with or immediately following a meal.  90 tablet  3  . Multiple Vitamin (MULITIVITAMIN WITH MINERALS) TABS Take 1 tablet by mouth daily.  Review of Systems System review is negative for any constitutional, cardiac, pulmonary, GI or neuro symptoms or complaints other than as described in the HPI.     Objective:   Physical Exam Filed Vitals:   12/13/11 1411  BP: 118/78  Pulse: 71  Temp: 98 F (36.7 C)  Resp: 16   Wt Readings from Last 3 Encounters:  12/13/11 254 lb (115.214 kg)  10/24/11 262 lb (118.842 kg)  10/02/11 259 lb (117.482 kg)   Gen'l- a pleasant man who has visibly lost weight Cor- RRR PUlm - normal respirations.       Assessment & Plan:

## 2011-12-13 NOTE — Patient Instructions (Addendum)
Weight management - GREAT JOB!!!!!  Keep up the good work. Remember to have patience in getting to your target weight of 220 lbs - shooting for a pound a month loss on average.  Diabetes- for A1c today. If 6.0 or less will be able to stop metformin with follow-up lab in 3 months.  Blood pressure - doing really well. At this time stop the metoprolol. Watch your readings. If your BP goes to 130 + will need to add the metoprolol back.  Enjoy your new wardrobe.

## 2011-12-20 ENCOUNTER — Telehealth: Payer: Self-pay | Admitting: *Deleted

## 2011-12-20 NOTE — Assessment & Plan Note (Signed)
BP Readings from Last 3 Encounters:  12/13/11 118/78  10/24/11 100/64  10/02/11 122/74   Very good control.  Plan - stop metoprolol and follow BP

## 2011-12-20 NOTE — Assessment & Plan Note (Signed)
BMI 40.6 Jan '13. BMI today - 36.5!! Weight is down from 283 to 254!!  Plan KEEP UP THE GOOD WORK!

## 2011-12-20 NOTE — Telephone Encounter (Signed)
Message copied by Elnora Morrison on Wed Dec 20, 2011  2:50 PM ------      Message from: Illene Regulus E      Created: Wed Dec 20, 2011 12:22 AM       Call patient: A1C 5.9% - ok to stop metformin. Repeat lab Nov 7 or later.

## 2011-12-20 NOTE — Assessment & Plan Note (Addendum)
Lab Results  Component Value Date   HGBA1C 5.9 12/13/2011   Very good control with previous A1C 6.5%  Plan - stop metformin with f/u A1C in 3 months

## 2011-12-20 NOTE — Telephone Encounter (Signed)
Patient notified of lab value and to stop metformin. Repeat lab on nov.7th,2013

## 2011-12-20 NOTE — Telephone Encounter (Signed)
Message copied by Elnora Morrison on Wed Dec 20, 2011  2:52 PM ------      Message from: Illene Regulus E      Created: Wed Dec 20, 2011 12:22 AM       Call patient: A1C 5.9% - ok to stop metformin. Repeat lab Nov 7 or later.

## 2011-12-22 ENCOUNTER — Encounter: Payer: Self-pay | Admitting: Internal Medicine

## 2011-12-25 ENCOUNTER — Encounter: Payer: Self-pay | Admitting: Internal Medicine

## 2012-03-29 ENCOUNTER — Telehealth: Payer: Self-pay | Admitting: Internal Medicine

## 2012-03-29 NOTE — Telephone Encounter (Signed)
Pt calling to schedule yearly physical.  Transferred to [place] in office.

## 2012-04-21 ENCOUNTER — Other Ambulatory Visit: Payer: Self-pay | Admitting: Endocrinology

## 2012-04-24 ENCOUNTER — Ambulatory Visit (INDEPENDENT_AMBULATORY_CARE_PROVIDER_SITE_OTHER): Payer: Medicare Other | Admitting: Internal Medicine

## 2012-04-24 ENCOUNTER — Other Ambulatory Visit: Payer: Self-pay | Admitting: *Deleted

## 2012-04-24 ENCOUNTER — Encounter: Payer: Self-pay | Admitting: Internal Medicine

## 2012-04-24 VITALS — BP 114/82 | HR 83 | Temp 97.4°F | Ht 70.0 in | Wt 255.0 lb

## 2012-04-24 DIAGNOSIS — J069 Acute upper respiratory infection, unspecified: Secondary | ICD-10-CM

## 2012-04-24 MED ORDER — HYDROCODONE-HOMATROPINE 5-1.5 MG/5ML PO SYRP: 5.0000 mL | ORAL_SOLUTION | Freq: Four times a day (QID) | ORAL | Status: DC | PRN

## 2012-04-24 MED ORDER — DOXYCYCLINE HYCLATE 100 MG PO TABS: 100.0000 mg | ORAL_TABLET | Freq: Two times a day (BID) | ORAL | Status: DC

## 2012-04-24 NOTE — Progress Notes (Signed)
HPI  Pt presents to the clinic with 4 day history of cough, chest congestion and fatigue. He has tried OTC Mucinex. It has not helped. He does get recurrent URI that have been treated with Ceftin and most recently doxcycline. The Ceftin did not work as well as the Doxycycline. He was last treated in 10/2011 for the same. He denies fever, chills, nausea, vomiting or diarrhea. He does not have sick contacts that he can remember.  Review of Systems    Past Medical History  Diagnosis Date  . Right bundle branch block   . Other specified cardiac dysrhythmias   . HTN (hypertension)   . Arthritis   . Dyspnea   . Diabetes mellitus   . Localized osteoarthrosis not specified whether primary or secondary, hand   . Gout   . History of colonic polyps   . OSA (obstructive sleep apnea)     Family History  Problem Relation Age of Onset  . Cancer Father     throat and tongue  . Diabetes Maternal Uncle   . Heart attack Maternal Grandmother   . Heart disease Maternal Grandmother     CAD/MI  . Heart disease Maternal Grandfather   . Heart disease Mother   . Heart disease Maternal Aunt     CAD    History   Social History  . Marital Status: Single    Spouse Name: N/A    Number of Children: N/A  . Years of Education: 16   Occupational History  . self employed, Nurse, children's   .     Social History Main Topics  . Smoking status: Former Smoker -- 2.0 packs/day for 30 years    Types: Cigarettes    Quit date: 05/15/1984  . Smokeless tobacco: Not on file  . Alcohol Use: Not on file  . Drug Use: Not on file  . Sexually Active: Not on file   Other Topics Concern  . Not on file   Social History Narrative   HSG, [place] [employer]Work: self employed...sales fabricI-ADLsEnd-of-Life: Yes-CPR, Yes-short-term Mechanical Ventilation, but no prolonged ventilation. Is willing to undergo dialysis, prolonged tub feeding.Pt states former smoker. 1986. 2ppd. Started age 37    No Known  Allergies   Constitutional: Positive headache, fatigue.  Denies fever or abrupt weight changes.  HEENT:  Positive nasal congestion and sore throat. Denies eye redness, ear pain, ringing in the ears, wax buildup, runny nose or bloody nose. Respiratory: Positive cough without sputum production. Denies difficulty breathing or shortness of breath.  Cardiovascular: Denies chest pain, chest tightness, palpitations or swelling in the hands or feet.   No other specific complaints in a complete review of systems (except as listed in HPI above).  Objective:    General: Appears his stated age, well developed, well nourished in NAD. HEENT: Head: normal shape and size; Eyes: sclera white, no icterus, conjunctiva pink, PERRLA and EOMs intact; Ears: Tm's gray and intact, normal light reflex; Nose: mucosa pink and moist, septum midline; Throat/Mouth: + PND. Teeth present, mucosa pink and moist, no exudate noted, no lesions or ulcerations noted.  Neck: Mild cervical lymphadenopathy. Neck supple, trachea midline. No massses, lumps or thyromegaly present.  Cardiovascular: Normal rate and rhythm. S1,S2 noted.  No murmur, rubs or gallops noted. No JVD or BLE edema. No carotid bruits noted. Pulmonary/Chest: Normal effort and positive vesicular breath sounds. No respiratory distress. No wheezes, rales or ronchi noted.      Assessment & Plan:   Upper Respiratory Infection  Can use a Neti Pot which can be purchased from your local drug store. doxycycline BID for 10 days  RTC as needed or if symptoms persist.

## 2012-04-24 NOTE — Patient Instructions (Addendum)

## 2012-06-06 ENCOUNTER — Encounter: Payer: Self-pay | Admitting: Internal Medicine

## 2012-06-06 ENCOUNTER — Ambulatory Visit (INDEPENDENT_AMBULATORY_CARE_PROVIDER_SITE_OTHER): Payer: Medicare Other | Admitting: Internal Medicine

## 2012-06-06 ENCOUNTER — Other Ambulatory Visit (INDEPENDENT_AMBULATORY_CARE_PROVIDER_SITE_OTHER): Payer: Medicare Other

## 2012-06-06 ENCOUNTER — Encounter: Payer: Medicare Other | Admitting: Internal Medicine

## 2012-06-06 VITALS — BP 112/72 | HR 81 | Temp 97.6°F | Resp 12 | Ht 70.0 in | Wt 262.0 lb

## 2012-06-06 DIAGNOSIS — I10 Essential (primary) hypertension: Secondary | ICD-10-CM

## 2012-06-06 DIAGNOSIS — Z0000 Encounter for general adult medical examination without abnormal findings: Secondary | ICD-10-CM

## 2012-06-06 DIAGNOSIS — M109 Gout, unspecified: Secondary | ICD-10-CM

## 2012-06-06 DIAGNOSIS — G4733 Obstructive sleep apnea (adult) (pediatric): Secondary | ICD-10-CM

## 2012-06-06 DIAGNOSIS — E119 Type 2 diabetes mellitus without complications: Secondary | ICD-10-CM

## 2012-06-06 DIAGNOSIS — I4510 Unspecified right bundle-branch block: Secondary | ICD-10-CM

## 2012-06-06 DIAGNOSIS — Z8679 Personal history of other diseases of the circulatory system: Secondary | ICD-10-CM

## 2012-06-06 DIAGNOSIS — Z9889 Other specified postprocedural states: Secondary | ICD-10-CM

## 2012-06-06 DIAGNOSIS — H9190 Unspecified hearing loss, unspecified ear: Secondary | ICD-10-CM

## 2012-06-06 DIAGNOSIS — Z8673 Personal history of transient ischemic attack (TIA), and cerebral infarction without residual deficits: Secondary | ICD-10-CM | POA: Insufficient documentation

## 2012-06-06 LAB — COMPREHENSIVE METABOLIC PANEL
ALT: 31 U/L (ref 0–53)
Albumin: 4 g/dL (ref 3.5–5.2)
CO2: 29 mEq/L (ref 19–32)
Calcium: 9.6 mg/dL (ref 8.4–10.5)
Chloride: 101 mEq/L (ref 96–112)
GFR: 87.61 mL/min (ref >60.00)
Potassium: 4 mEq/L (ref 3.5–5.1)
Sodium: 137 mEq/L (ref 135–145)
Total Protein: 7.6 g/dL (ref 6.0–8.3)

## 2012-06-06 LAB — CBC WITH DIFFERENTIAL/PLATELET
Basophils Absolute: 0 10*3/uL (ref 0.0–0.1)
Basophils Relative: 0.4 % (ref 0.0–3.0)
Eosinophils Absolute: 0.1 10*3/uL (ref 0.0–0.7)
Lymphocytes Relative: 20.3 % (ref 12.0–46.0)
MCHC: 34 g/dL (ref 30.0–36.0)
Neutrophils Relative %: 71.1 % (ref 43.0–77.0)
RBC: 5.27 Mil/uL (ref 4.22–5.81)
RDW: 14.3 % (ref 11.5–14.6)

## 2012-06-06 LAB — HEPATIC FUNCTION PANEL: Albumin: 4 g/dL (ref 3.5–5.2)

## 2012-06-06 LAB — HEMOGLOBIN A1C: Hgb A1c MFr Bld: 6.6 % — ABNORMAL HIGH (ref 4.6–6.5)

## 2012-06-06 NOTE — Patient Instructions (Addendum)
Thanks for coming to see me. Everything looks good including the EKG  Will set up a carotid doppler study and a referral to Dr. Eden Emms.  Lab work today - all results, and doppler, can be accessed using My Chart - please sign up.

## 2012-06-06 NOTE — Progress Notes (Signed)
Subjective:    Patient ID: Matthew Villanueva, male    DOB: 02-04-1936, 77 y.o.   MRN: [id number]  HPI The patient is here for annual Medicare wellness examination and management of other chronic and acute problems.  Interval history significant for URI in June '13 and December '13 - both which resolved. No other major medical problems. He still has sleep apnea - the equipment was taken away foir lack of use. He has not been back to see sleep medicine.  He continues to go to [employer]. He did loose 23 lbs but put a little back on during the Holidays.   The risk factors are reflected in the social history.  The roster of all physicians providing medical care to patient - is listed in the Snapshot section of the chart.  Activities of daily living:  The patient is 100% inedpendent in all ADLs: dressing, toileting, feeding as well as independent mobility  Home safety : The patient has smoke detectors in the home. Falls - no falls in the last year. Home is fall safe. They wear seatbelts. No firearms at home   There is no risks for hepatitis, STDs or HIV. There is no   history of blood transfusion. They have no travel history to infectious disease endemic areas of the world.  The patient has seen their dentist in the last six month. They have seen their eye doctor in the last year. They admit to hearing difficulty and have not had audiologic testing in the last year.    They do not  have excessive sun exposure. Discussed the need for sun protection: hats, long sleeves and use of sunscreen if there is significant sun exposure.   Diet: the importance of a healthy diet is discussed. They do have a healthy diet.  The patient has no regular exercise program.  The benefits of regular aerobic exercise were discussed. Exercise is your job!!!  Depression screen: there are no signs or vegative symptoms of depression- irritability, change in appetite, anhedonia,  sadness/tearfullness.  Cognitive assessment: the patient manages all their financial and personal affairs and is actively engaged.   The following portions of the patient's history were reviewed and updated as appropriate: allergies, current medications, past family history, past medical history,  past surgical history, past social history  and problem list.  Past Medical History  Diagnosis Date  . Right bundle branch block   . Other specified cardiac dysrhythmias   . HTN (hypertension)   . Arthritis   . Dyspnea   . Diabetes mellitus   . Localized osteoarthrosis not specified whether primary or secondary, hand   . Gout   . History of colonic polyps   . OSA (obstructive sleep apnea)    Past Surgical History  Procedure Date  . Hemorrhoid surgery 1960  . Pilonidal cyst drainage   . Cardiac electrophysiology study and ablation 2004    ablation for SVT with a chronic right bundle branch block    Family History  Problem Relation Age of Onset  . Cancer Father     throat and tongue  . Diabetes Maternal Uncle   . Heart attack Maternal Grandmother   . Heart disease Maternal Grandmother     CAD/MI  . Heart disease Maternal Grandfather   . Heart disease Mother   . Heart disease Maternal Aunt     CAD   History   Social History  . Marital Status: Single    Spouse Name: N/A  Number of Children: N/A  . Years of Education: 16   Occupational History  . self employed, Nurse, children's   .     Social History Main Topics  . Smoking status: Former Smoker -- 2.0 packs/day for 30 years    Types: Cigarettes    Quit date: 05/15/1984  . Smokeless tobacco: Not on file  . Alcohol Use: Not on file  . Drug Use: Not on file  . Sexually Active: Not on file   Other Topics Concern  . Not on file   Social History Narrative   [place], [place] State[place]Work: self employed...sales fabricI-ADLsEnd-of-Life: Yes-CPR, Yes-short-term Mechanical Ventilation, but no prolonged ventilation. Is  willing to undergo dialysis, prolonged tub feeding.Pt states former smoker. 1986. 2ppd. Started age 52    Current Outpatient Prescriptions on File Prior to Visit  Medication Sig Dispense Refill  . aspirin 81 MG tablet Take 81 mg by mouth daily.        . clotrimazole-betamethasone (LOTRISONE) cream Apply 1 application topically 2 (two) times daily as needed. For dry skin      . colchicine (COLCRYS) 0.6 MG tablet Take 2 tablets [1.2 mg total] by mouth daily. Repeat 1 tablet [2] two hours later if needed.  10 tablet  2  . HYDROcodone-homatropine (HYCODAN) 5-1.5 MG/5ML syrup Take 5 mLs by mouth every 6 (six) hours as needed for cough.  120 mL  0  . losartan-hydrochlorothiazide (HYZAAR) 100-25 MG per tablet Take 1 tablet by mouth daily.  90 tablet  3  . Multiple Vitamin (MULITIVITAMIN WITH MINERALS) TABS Take 1 tablet by mouth daily.        Vision, hearing, body mass index were assessed and reviewed.   During the course of the visit the patient was educated and counseled about appropriate screening and preventive services including : fall prevention , diabetes screening, nutrition counseling, colorectal cancer screening, and recommended immunizations.    Review of Systems Constitutional:  Negative for fever, chills, activity change and unexpected weight change.  HEENT:  Negative for hearing loss, ear pain, congestion, neck stiffness and postnasal drip. Negative for sore throat or swallowing problems. Negative for dental complaints.   Eyes: Negative for vision loss or change in visual acuity.  Respiratory: Negative for chest tightness and wheezing. Negative for DOE.   Cardiovascular: Negative for chest pain or palpitations. No decreased exercise tolerance Gastrointestinal: No change in bowel habit. No bloating or gas. No reflux or indigestion Genitourinary: Negative for urgency, frequency, flank pain and difficulty urinating.  Musculoskeletal: Negative for myalgias, back pain, arthralgias and  gait problem.  Neurological: Negative for dizziness, tremors, weakness and headaches.  Hematological: Negative for adenopathy.  Psychiatric/Behavioral: Negative for behavioral problems and dysphoric mood.       Objective:   Physical Exam Filed Vitals:   06/06/12 1327  BP: 112/72  Pulse: 81  Temp: 97.6 F (36.4 C)  Resp: 12   Wt Readings from Last 3 Encounters:  06/06/12 262 lb (118.842 kg)  04/24/12 255 lb (115.667 kg)  12/13/11 254 lb (115.214 kg)   Gen'l: Well nourished well developed, overweight white male in no acute distress  HEENT: Head: Normocephalic and atraumatic. Right Ear: External ear normal. EAC/TM nl. Left Ear: External ear normal.  EAC/TM nl. Nose: Nose normal. Mouth/Throat: Oropharynx is clear and moist. Dentition - native, in good repair. No buccal or palatal lesions. Posterior pharynx clear. Eyes: Conjunctivae and sclera clear. EOM intact. Pupils are equal, round, and reactive to light. Right eye exhibits no discharge.  Left eye exhibits no discharge. Neck: Normal range of motion. Neck supple. No JVD present. No tracheal deviation present. No thyromegaly present.  Cardiovascular: Normal rate, regular rhythm, no gallop, no friction rub, no murmur heard.      Quiet precordium. 2+ radial and DP pulses . No carotid bruits Pulmonary/Chest: Effort normal. No respiratory distress or increased WOB, no wheezes, no rales. No chest wall deformity or CVAT. Abdomen: Soft. Bowel sounds are normal in all quadrants. He exhibits no distension, no tenderness, no rebound or guarding, No heptosplenomegaly  Genitourinary:  deferred Musculoskeletal: Normal range of motion. He exhibits no edema and no tenderness.       Small and large joints without redness, synovial thickening or deformity. Full range of motion preserved about all small, median and large joints.  Lymphadenopathy:    He has no cervical or supraclavicular adenopathy.  Neurological: He is alert and oriented to person,  place, and time. CN II-XII intact. DTRs 2+ and symmetrical biceps, radial and patellar tendons. Cerebellar function normal with no tremor, rigidity, normal gait and station.  Skin: Skin is warm and dry. No rash noted. No erythema.  Psychiatric: He has a normal mood and affect. His behavior is normal. Thought content normal.   Lab Results  Component Value Date   WBC 9.9 06/06/2012   HGB 15.7 06/06/2012   HCT 46.3 06/06/2012   PLT 245.0 06/06/2012   GLUCOSE 94 06/06/2012   CHOL 127 08/21/2011   TRIG 99.0 08/21/2011   HDL 36.70* 08/21/2011   LDLCALC 71 08/21/2011        ALT 31 06/06/2012   AST 22 06/06/2012        NA 137 06/06/2012   K 4.0 06/06/2012   CL 101 06/06/2012   CREATININE 0.9 06/06/2012   BUN 15 06/06/2012   CO2 29 06/06/2012   TSH 2.17 01/31/2007   PSA 0.73 04/29/2010   HGBA1C 6.6* 06/06/2012   MICROALBUR 4.0* 12/13/2011           Assessment & Plan:

## 2012-06-09 NOTE — Assessment & Plan Note (Signed)
Interval history - notable for weight management with a drop of 23 lbs moving from Obesity III to Obesity II. No major illness, surgery or injury. Physical exam is normal. He is current with colorectal cancer screening and has aged out of prostate cancer screening (ACU guidelines 4/13). Immunizations are up to date.   In summary - a nice man who is medically stable and doing well.  He is encouraged to continue "Weight Watcher." He may want to consider audiology evaluation if his hearing declines further. He will return in 1 Year, sooner as needed.

## 2012-06-09 NOTE — Assessment & Plan Note (Signed)
BP Readings from Last 3 Encounters:  06/06/12 112/72  04/24/12 114/82  12/13/11 118/78   Excellent control on present medication. No change in regimen indicated.

## 2012-06-09 NOTE — Assessment & Plan Note (Signed)
Last A1C better than goal of 7% or less w/o medication!! GOOD JOB  Plan Continue lifestyle management

## 2012-06-09 NOTE — Assessment & Plan Note (Signed)
Continued problem but not to the point where he is willing to accept amplification.  Plan - full audiology evaluation at his discretion.

## 2012-06-09 NOTE — Assessment & Plan Note (Signed)
History of gout w/o recent flares. His Uric Acid has been as high as 7.5 and is now 7.0 (nl up to 7.8).  Plan For recurrent flares, more than 2/90months, would start allopurinol.

## 2012-06-09 NOTE — Assessment & Plan Note (Signed)
Mr. Hubbert has been doing "Weight Watchers" with good success: 23 lbs weight loss and BMI down from 40.6 to 37.6.  Plan KEEP UP THE GOOD WORK!

## 2012-06-11 ENCOUNTER — Encounter (INDEPENDENT_AMBULATORY_CARE_PROVIDER_SITE_OTHER): Payer: Medicare Other

## 2012-06-11 DIAGNOSIS — G459 Transient cerebral ischemic attack, unspecified: Secondary | ICD-10-CM

## 2012-06-11 DIAGNOSIS — R42 Dizziness and giddiness: Secondary | ICD-10-CM

## 2012-06-11 DIAGNOSIS — Z8673 Personal history of transient ischemic attack (TIA), and cerebral infarction without residual deficits: Secondary | ICD-10-CM

## 2012-06-11 DIAGNOSIS — I6529 Occlusion and stenosis of unspecified carotid artery: Secondary | ICD-10-CM

## 2012-06-16 ENCOUNTER — Encounter: Payer: Self-pay | Admitting: Internal Medicine

## 2012-07-03 ENCOUNTER — Ambulatory Visit: Payer: Medicare Other | Admitting: Pulmonary Disease

## 2012-07-15 ENCOUNTER — Encounter: Payer: Medicare Other | Admitting: Internal Medicine

## 2012-07-15 ENCOUNTER — Ambulatory Visit: Payer: Medicare Other | Admitting: Pulmonary Disease

## 2012-08-06 ENCOUNTER — Ambulatory Visit (INDEPENDENT_AMBULATORY_CARE_PROVIDER_SITE_OTHER): Payer: Medicare Other | Admitting: Pulmonary Disease

## 2012-08-06 ENCOUNTER — Encounter: Payer: Self-pay | Admitting: Pulmonary Disease

## 2012-08-06 VITALS — BP 118/68 | HR 79 | Temp 98.4°F | Ht 70.0 in | Wt 268.8 lb

## 2012-08-06 DIAGNOSIS — G4733 Obstructive sleep apnea (adult) (pediatric): Secondary | ICD-10-CM

## 2012-08-06 NOTE — Patient Instructions (Signed)
Will arrange for home sleep study  Will call to schedule follow up after sleep study reviewed 

## 2012-08-06 NOTE — Assessment & Plan Note (Signed)
He has history of severe sleep apnea, but had difficulty tolerating CPAP/BiPAP.  He was tried on oral appliance, but this was unsuccessful.  He continues to have snoring, sleep disruption, and daytime sleepiness.  He also has history of hypertension and diabetes.  His BMI is > 35.  He likely still has sleep apnea.  To further assess will arrange for home sleep study.  Will then likely arrange for auto CPAP set up, and follow up after that.

## 2012-08-06 NOTE — Progress Notes (Signed)
Chief Complaint  Patient presents with  . Follow-up    Pt c/o waking up feeling exhausted, snoring at night. Pt stated the oral appliance did not help with his sleep apnea. Pt stoppped using it since it did not help and also he had a tooth pulled and it does not fit any longer.    History of Present Illness: Matthew Villanueva is a 77 y.o. male with severe OSA.  He was last seen 01/02/11.  He was intolerant of CPAP/BiPAP, and referred for oral appliance.  He is no longer able to use his oral appliance.  He had trouble with teeth pain.  He continues to have snoring, and wakes up feeling like he can't breath.  He sleeps for about 7 hours per night, but still feels sleepy during the day.  He uses the bathroom at least twice per night.  The patient denies sleep walking, sleep talking, bruxism, or nightmares.  There is no history of restless legs.  The patient denies sleep hallucinations, sleep paralysis, or cataplexy.  TESTS: PSG 06/28/10 >> AHI 47.4, SpO2 low 74%. Supine AHI 91.3, Non-supine AHI 30.2. BPAP 20/16 01/02/11 >> Refer for oral appliance   Matthew Villanueva  has a past medical history of Right bundle branch block; Other specified cardiac dysrhythmias; HTN (hypertension); Arthritis; Dyspnea; Diabetes mellitus; Localized osteoarthrosis not specified whether primary or secondary, hand; Gout; History of colonic polyps; and OSA (obstructive sleep apnea).  Matthew Villanueva  has past surgical history that includes Hemorrhoid surgery (1960); Pilonidal cyst drainage; and Cardiac electrophysiology study and ablation (2004).  Prior to Admission medications   Medication Sig Start Date End Date Taking? Authorizing Provider  aspirin 81 MG tablet Take 81 mg by mouth daily.     Yes Historical Provider, MD  clotrimazole-betamethasone (LOTRISONE) cream Apply 1 application topically 2 (two) times daily as needed. For dry skin   Yes Historical Provider, MD  colchicine (COLCRYS) 0.6 MG tablet  Take 2 tablets [1.2 mg total] by mouth daily. Repeat 1 tablet [2] two hours later if needed. 08/21/11  Yes Jacques Navy, MD  HYDROcodone-homatropine [facility]) 5-1.5 MG/5ML syrup Take 5 mLs by mouth every 6 (six) hours as needed for cough. 04/24/12  Yes Nicki Reaper, NP  losartan-hydrochlorothiazide (HYZAAR) 100-25 MG per tablet Take 1 tablet by mouth daily. 08/21/11  Yes Jacques Navy, MD  Multiple Vitamin (MULITIVITAMIN WITH MINERALS) TABS Take 1 tablet by mouth daily.   Yes Historical Provider, MD    No Known Allergies   Physical Exam:  General - No distress ENT - No sinus tenderness, MP 4, enlarged tongue, no oral exudate, no LAN Cardiac - s1s2 regular, no murmur Chest - No wheeze/rales/dullness Back - No focal tenderness Abd - Soft, non-tender Ext - No edema Neuro - Normal strength Skin - No rashes Psych - normal mood, and behavior   Assessment/Plan:  Matthew Helling, MD [place] [facility]/Critical Care/Sleep Pager:  [phone] 08/06/2012, 4:01 PM

## 2012-09-02 ENCOUNTER — Ambulatory Visit (INDEPENDENT_AMBULATORY_CARE_PROVIDER_SITE_OTHER): Payer: Medicare Other | Admitting: Pulmonary Disease

## 2012-09-02 DIAGNOSIS — G4733 Obstructive sleep apnea (adult) (pediatric): Secondary | ICD-10-CM

## 2012-09-06 ENCOUNTER — Other Ambulatory Visit: Payer: Self-pay

## 2012-09-06 MED ORDER — LOSARTAN POTASSIUM-HCTZ 100-25 MG PO TABS: 1.0000 | ORAL_TABLET | Freq: Every day | ORAL | Status: DC

## 2012-09-06 NOTE — Telephone Encounter (Signed)
Pt Losartan/HCT 100-25 mg accidentally routed to [employer], correctly routed it to [employer].

## 2012-09-09 ENCOUNTER — Telehealth: Payer: Self-pay | Admitting: Pulmonary Disease

## 2012-09-09 DIAGNOSIS — G4733 Obstructive sleep apnea (adult) (pediatric): Secondary | ICD-10-CM

## 2012-09-09 NOTE — Telephone Encounter (Signed)
HST 09/02/12 >> AHI 29.8, SaO2 low 70%.  This is consistent with moderate OSA.  Results d/w pt.  Will arrange for Auto CPAP set up.    Will have my nurse schedule ROV 2 months after CPAP set up.

## 2012-09-11 NOTE — Telephone Encounter (Signed)
I spoke with pt. He has not been set up yet. He will call once he does. I will also send myself a reminder. Nothing further needed

## 2012-09-19 ENCOUNTER — Telehealth: Payer: Self-pay | Admitting: Pulmonary Disease

## 2012-09-19 NOTE — Telephone Encounter (Signed)
Received 4 pages from [facility], sent to Dr. Craige Cotta. 09/19/12/ss

## 2012-10-17 ENCOUNTER — Telehealth: Payer: Self-pay | Admitting: Pulmonary Disease

## 2012-10-17 NOTE — Telephone Encounter (Signed)
Spoke to pt. Appointment has been scheduled for 11/20/2012 at 3:30pm. Nothing further was needed.

## 2012-11-11 ENCOUNTER — Ambulatory Visit (INDEPENDENT_AMBULATORY_CARE_PROVIDER_SITE_OTHER): Payer: Medicare Other | Admitting: Pulmonary Disease

## 2012-11-11 ENCOUNTER — Encounter: Payer: Self-pay | Admitting: Pulmonary Disease

## 2012-11-11 VITALS — BP 124/68 | HR 70 | Temp 98.0°F | Ht 70.0 in | Wt 271.4 lb

## 2012-11-11 DIAGNOSIS — G4733 Obstructive sleep apnea (adult) (pediatric): Secondary | ICD-10-CM

## 2012-11-11 MED ORDER — ZOLPIDEM TARTRATE 5 MG PO TABS: 5.0000 mg | ORAL_TABLET | Freq: Every evening | ORAL | Status: DC | PRN

## 2012-11-11 NOTE — Patient Instructions (Signed)
Zolpidem 5 mg pill as needed before bedtime Follow up in 2 months

## 2012-11-11 NOTE — Assessment & Plan Note (Signed)
He continues to have difficulty adjusting to CPAP.  Will get his download.  Will have him try using zolpidem as needed to help with sleep initiation.  If this doesn't work, then he will need in lab titration.

## 2012-11-11 NOTE — Progress Notes (Signed)
Chief Complaint  Patient presents with  . Sleep Apnea    Pt is wearing CPAP 5/7 nights of the week. He stated he has not been able to wear it all night. He states it takes him 1 hr to go to sleep once he puts machine on and then will wake up 3 hrs later.     History of Present Illness: Matthew Villanueva is a 77 y.o. male with severe OSA.  He still has trouble falling asleep.  He will sleep for a few hours, then wake up to use the bathroom.  He then can't go back to sleep.  When he uses CPAP this helps, but he has trouble falling/staying asleep.  He has nasal mask, and no problem with mask fit.  TESTS: PSG 06/28/10 >> AHI 47.4, SpO2 low 74%. Supine AHI 91.3, Non-supine AHI 30.2. BPAP 20/16 01/02/11 >> Refer for oral appliance HST 09/02/12 >> AHI 29.8, SaO2 low 70%.   Matthew Villanueva  has a past medical history of Right bundle branch block; Other specified cardiac dysrhythmias(427.89); HTN (hypertension); Arthritis; Dyspnea; Diabetes mellitus; Localized osteoarthrosis not specified whether primary or secondary, hand; Gout; History of colonic polyps; and OSA (obstructive sleep apnea).  Matthew Villanueva  has past surgical history that includes Hemorrhoid surgery (1960); Pilonidal cyst drainage; and Cardiac electrophysiology study and ablation (2004).  Prior to Admission medications   Medication Sig Start Date End Date Taking? Authorizing Provider  aspirin 81 MG tablet Take 81 mg by mouth daily.     Yes Historical Provider, MD  clotrimazole-betamethasone (LOTRISONE) cream Apply 1 application topically 2 (two) times daily as needed. For dry skin   Yes Historical Provider, MD  colchicine (COLCRYS) 0.6 MG tablet Take 2 tablets [1.2 mg total] by mouth daily. Repeat 1 tablet [2] two hours later if needed. 08/21/11  Yes Jacques Navy, MD  HYDROcodone-homatropine [facility]) 5-1.5 MG/5ML syrup Take 5 mLs by mouth every 6 (six) hours as needed for cough. 04/24/12  Yes Nicki Reaper, NP   losartan-hydrochlorothiazide (HYZAAR) 100-25 MG per tablet Take 1 tablet by mouth daily. 08/21/11  Yes Jacques Navy, MD  Multiple Vitamin (MULITIVITAMIN WITH MINERALS) TABS Take 1 tablet by mouth daily.   Yes Historical Provider, MD    No Known Allergies   Physical Exam:  General - No distress ENT - No sinus tenderness, MP 4, enlarged tongue, no oral exudate, no LAN Cardiac - s1s2 regular, no murmur Chest - No wheeze/rales/dullness Back - No focal tenderness Abd - Soft, non-tender Ext - No edema Neuro - Normal strength Skin - No rashes Psych - normal mood, and behavior   Assessment/Plan:  Coralyn Helling, MD [place] [facility]/Critical Care/Sleep Pager:  [phone] 11/11/2012, 3:43 PM

## 2012-11-13 ENCOUNTER — Encounter: Payer: Self-pay | Admitting: Pulmonary Disease

## 2012-11-20 ENCOUNTER — Ambulatory Visit: Payer: Medicare Other | Admitting: Pulmonary Disease

## 2013-01-02 ENCOUNTER — Encounter: Payer: Self-pay | Admitting: Internal Medicine

## 2013-01-10 ENCOUNTER — Ambulatory Visit: Payer: Medicare Other | Admitting: Pulmonary Disease

## 2013-01-20 ENCOUNTER — Other Ambulatory Visit (INDEPENDENT_AMBULATORY_CARE_PROVIDER_SITE_OTHER): Payer: Medicare Other

## 2013-01-20 ENCOUNTER — Encounter: Payer: Self-pay | Admitting: Internal Medicine

## 2013-01-20 ENCOUNTER — Ambulatory Visit (INDEPENDENT_AMBULATORY_CARE_PROVIDER_SITE_OTHER): Payer: Medicare Other | Admitting: Internal Medicine

## 2013-01-20 VITALS — BP 120/80 | HR 69 | Temp 98.0°F | Wt 271.2 lb

## 2013-01-20 DIAGNOSIS — R209 Unspecified disturbances of skin sensation: Secondary | ICD-10-CM

## 2013-01-20 DIAGNOSIS — Z23 Encounter for immunization: Secondary | ICD-10-CM

## 2013-01-20 DIAGNOSIS — I10 Essential (primary) hypertension: Secondary | ICD-10-CM

## 2013-01-20 DIAGNOSIS — R202 Paresthesia of skin: Secondary | ICD-10-CM

## 2013-01-20 DIAGNOSIS — E119 Type 2 diabetes mellitus without complications: Secondary | ICD-10-CM

## 2013-01-20 LAB — COMPREHENSIVE METABOLIC PANEL
Alkaline Phosphatase: 18 U/L — ABNORMAL LOW (ref 39–117)
BUN: 12 mg/dL (ref 6–23)
CO2: 28 mEq/L (ref 19–32)
Creatinine, Ser: 1 mg/dL (ref 0.4–1.5)
GFR: 82.17 mL/min (ref >60.00)
Glucose, Bld: 180 mg/dL — ABNORMAL HIGH (ref 70–99)
Total Bilirubin: 0.6 mg/dL (ref 0.3–1.2)
Total Protein: 7.3 g/dL (ref 6.0–8.3)

## 2013-01-20 LAB — HEMOGLOBIN A1C: Hgb A1c MFr Bld: 7.8 % — ABNORMAL HIGH (ref 4.6–6.5)

## 2013-01-20 NOTE — Patient Instructions (Addendum)
Paresthesia (numbness) left leg - on exam there was no area of decreased sensation along the lateral thigh or distal leg. It is possible this is a variant of meralgia paresthetic = compression of the lateral femoral cutaneous nerve which can result from binding clothes at the left groin. Reviewed x-ray reports: had a lumbar spine study in '09 - no significant abnormality. Foot sensation loss may be diabetic related peripheral neuropathy.  Plan Lab: B12, Thyroid stimulating hormone  Avoid tight or binding clothing.  No indication for imaging studies at this time. If the problem gets worse we can consider repeat lumbar spine xrays.  Last carotid doppler Jan '14 : 0-39% blockage - basically no blockage. Repeat in 1 year rcommended.  Last colonoscopy '06 - no pathology report,most likely a normal study with repeat due in 2016.  Diabetes - due for an A1C which we will do today.

## 2013-01-20 NOTE — Progress Notes (Signed)
Subjective:    Patient ID: Matthew Villanueva, male    DOB: [birth year], 77 y.o.   MRN: [id number]  HPI With sitting he will develop numbness in the left leg lateral thigh and lateral distal LE. This has been going on for 6-8. Loss of sensation is better with standing. No problem when supine.   Past Medical History  Diagnosis Date  . Right bundle branch block   . Other specified cardiac dysrhythmias(427.89)   . HTN (hypertension)   . Arthritis   . Dyspnea   . Diabetes mellitus   . Localized osteoarthrosis not specified whether primary or secondary, hand   . Gout   . History of colonic polyps   . OSA (obstructive sleep apnea)    Past Surgical History  Procedure Laterality Date  . Hemorrhoid surgery  1960  . Pilonidal cyst drainage    . Cardiac electrophysiology study and ablation  2004    ablation for SVT with a chronic right bundle branch block    Family History  Problem Relation Age of Onset  . Cancer Father     throat and tongue  . Diabetes Maternal Uncle   . Heart attack Maternal Grandmother   . Heart disease Maternal Grandmother     CAD/MI  . Heart disease Maternal Grandfather   . Heart disease Mother   . Heart disease Maternal Aunt     CAD   History   Social History  . Marital Status: Single    Spouse Name: N/A    Number of Children: N/A  . Years of Education: 16   Occupational History  . self employed, Nurse, children's   .     Social History Main Topics  . Smoking status: Former Smoker -- 2.00 packs/day for 30 years    Types: Cigarettes    Quit date: 05/15/1984  . Smokeless tobacco: Not on file  . Alcohol Use: Yes     Comment: rare use  . Drug Use: No  . Sexual Activity: Not on file   Other Topics Concern  . 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 Is willing to undergo  dialysis, prolonged tub feeding.   Pt states former smoker. 1986. 2ppd. Started age 31    Current Outpatient Prescriptions on File Prior to Visit  Medication Sig Dispense Refill  . aspirin 81 MG tablet Take 81 mg by mouth daily.        . clotrimazole-betamethasone (LOTRISONE) cream Apply 1 application topically 2 (two) times daily as needed. For dry skin      . colchicine (COLCRYS) 0.6 MG tablet Take 2 tablets [1.2 mg total] by mouth daily. Repeat 1 tablet [2] two hours later if needed.  10 tablet  2  . losartan-hydrochlorothiazide (HYZAAR) 100-25 MG per tablet Take 1 tablet by mouth daily.  90 tablet  3  . Multiple Vitamin (MULITIVITAMIN WITH MINERALS) TABS Take 1 tablet by mouth daily.      Marland Kitchen zolpidem (AMBIEN) 5 MG tablet Take 1 tablet (5 mg total) by mouth at bedtime as needed for sleep.  30 tablet  1   No current facility-administered medications on file prior to visit.      Review of Systems System review is negative for any constitutional, cardiac, pulmonary, GI or neuro symptoms or complaints other than as described in the HPI.  Objective:   Physical Exam Filed Vitals:   01/20/13 1105  BP: 120/80  Pulse: 69  Temp: 98 F (36.7 C)   Wt Readings from Last 3 Encounters:  01/20/13 271 lb 3.2 oz (123.016 kg)  11/11/12 271 lb 6.4 oz (123.106 kg)  08/06/12 268 lb 12.8 oz (121.927 kg)   Gen'l - overweight white man in no distress Cor 2+ radial, RRR Pulm - normal respirations Neuro - normal sensation left LE medial,dorsal, lateral thigh and calve to pin-prick, light touch. MSK - normal ROM left hip. Normal gait Derm - no skin lesions left LE       Assessment & Plan:  Paresthesia (numbness) left leg - on exam there was no area of decreased sensation along the lateral thigh or distal leg. It is possible this is a variant of meralgia paresthetic = compression of the lateral femoral cutaneous nerve which can result from binding clothes at the left groin. Reviewed x-ray  reports: had a lumbar spine study in '09 - no significant abnormality. Foot sensation loss may be diabetic related peripheral neuropathy.  Plan Lab: B12, Thyroid stimulating hormone  Avoid tight or binding clothing.  No indication for imaging studies at this time. If the problem gets worse we can consider repeat lumbar spine xrays.

## 2013-01-21 ENCOUNTER — Telehealth: Payer: Self-pay | Admitting: Pulmonary Disease

## 2013-01-21 DIAGNOSIS — G4733 Obstructive sleep apnea (adult) (pediatric): Secondary | ICD-10-CM

## 2013-01-21 NOTE — Assessment & Plan Note (Signed)
Stable in general. Due for A1C with recommendations to follow.

## 2013-01-21 NOTE — Telephone Encounter (Signed)
Spoke with pt  He states that he is not going to be able to tolerate CPAP, " just can not get used to it" He was advised by DME that due to non compliance, his ins would not cover They need order to pick up the CPAP  Please advise, thanks!

## 2013-01-21 NOTE — Assessment & Plan Note (Signed)
BP Readings from Last 3 Encounters:  01/20/13 120/80  11/11/12 124/68  08/06/12 118/68   Great control of BP on present regimen.  Plan No changes at this time

## 2013-01-22 NOTE — Telephone Encounter (Signed)
Okay to send order to discontinue CPAP set up.  Please advise pt to call back if he wishes to reconsider CPAP therapy.

## 2013-01-22 NOTE — Telephone Encounter (Signed)
Spoke with pt and notified of recs per VS He verbalized understanding  Nothing further needed Order sent to [facility]

## 2013-01-24 ENCOUNTER — Encounter: Payer: Self-pay | Admitting: Internal Medicine

## 2013-01-24 ENCOUNTER — Other Ambulatory Visit: Payer: Self-pay | Admitting: Internal Medicine

## 2013-01-24 DIAGNOSIS — E119 Type 2 diabetes mellitus without complications: Secondary | ICD-10-CM

## 2013-02-17 ENCOUNTER — Encounter: Payer: Self-pay | Admitting: Cardiology

## 2013-02-17 ENCOUNTER — Encounter: Payer: Self-pay | Admitting: Cardiovascular Disease

## 2013-02-26 ENCOUNTER — Ambulatory Visit: Payer: Medicare Other | Admitting: Pulmonary Disease

## 2013-03-04 ENCOUNTER — Encounter: Payer: Self-pay | Admitting: Gastroenterology

## 2013-03-04 ENCOUNTER — Encounter: Payer: Self-pay | Admitting: Internal Medicine

## 2013-03-20 ENCOUNTER — Other Ambulatory Visit: Payer: Self-pay

## 2013-04-09 ENCOUNTER — Encounter: Payer: Self-pay | Admitting: Cardiovascular Disease

## 2013-04-09 ENCOUNTER — Ambulatory Visit (INDEPENDENT_AMBULATORY_CARE_PROVIDER_SITE_OTHER): Payer: Medicare Other | Admitting: Cardiovascular Disease

## 2013-04-09 ENCOUNTER — Encounter (INDEPENDENT_AMBULATORY_CARE_PROVIDER_SITE_OTHER): Payer: Self-pay

## 2013-04-09 VITALS — BP 124/84 | HR 84 | Ht 70.0 in | Wt 270.8 lb

## 2013-04-09 DIAGNOSIS — I10 Essential (primary) hypertension: Secondary | ICD-10-CM

## 2013-04-09 DIAGNOSIS — I4510 Unspecified right bundle-branch block: Secondary | ICD-10-CM

## 2013-04-09 DIAGNOSIS — G4733 Obstructive sleep apnea (adult) (pediatric): Secondary | ICD-10-CM

## 2013-04-09 DIAGNOSIS — E119 Type 2 diabetes mellitus without complications: Secondary | ICD-10-CM

## 2013-04-09 DIAGNOSIS — R079 Chest pain, unspecified: Secondary | ICD-10-CM

## 2013-04-09 NOTE — Assessment & Plan Note (Signed)
Unable to tolerate CPAP and not using Discussed benefits of weight loss

## 2013-04-09 NOTE — Assessment & Plan Note (Signed)
Well controlled.  Continue current medications and low sodium Dash type diet.    

## 2013-04-09 NOTE — Assessment & Plan Note (Signed)
Atypical but sedentary with HTN and new onset DM  F/U ETT

## 2013-04-09 NOTE — Assessment & Plan Note (Signed)
Discussed low carb diet.  Target hemoglobin A1c is 6.5 or less.  Continue current medications.  

## 2013-04-09 NOTE — Assessment & Plan Note (Signed)
Stable over multiple years Yearly echo No signs of high grade AV block

## 2013-04-09 NOTE — Progress Notes (Signed)
Patient ID: Matthew Villanueva, male   DOB: Jan 09, 1936, 77 y.o.   MRN: [id number] 77 yo referred by Dr Arthur Holms Last seen in cardiology 3.5 years ago.  Hs r RBBB, palpitations with previous SVT ablation and hypertension. He takes his BP daily and has been in a good range. He has not had recurrent palptiatoins. His weight is down  but he still is overweight. He has been trying a low carb diet but needs more exercise. there has been no palpitaoitns, PND orthopnea. No SSCP.   Sees Dr Craige Cotta for sleep apnea Having issues with CPAP  Carotid ordered by primary 1/14 with no significant disease  Normal myovue 08/29/2006  Still struggling with diet and weight Will likely need to start oral hypoglycemics. Has exertional dyspnea and occasional SSCP intermitant less than a minute. Going on for a few months Thinks its GERD   ROS: Denies fever, malais, weight loss, blurry vision, decreased visual acuity, cough, sputum, SOB, hemoptysis, pleuritic pain, palpitaitons, heartburn, abdominal pain, melena, lower extremity edema, claudication, or rash.  All other systems reviewed and negative   General: Affect appropriate Obese white male HEENT: normal Neck supple with no adenopathy JVP normal no bruits no thyromegaly Lungs clear with no wheezing and good diaphragmatic motion Heart:  S1/S2 no murmur,rub, gallop or click PMI normal Abdomen: benighn, BS positve, no tenderness, no AAA no bruit.  No HSM or HJR Distal pulses intact with no bruits No edema Neuro non-focal Skin warm and dry No muscular weakness  Medications Current Outpatient Prescriptions  Medication Sig Dispense Refill  . aspirin 81 MG tablet Take 81 mg by mouth daily.        . clotrimazole-betamethasone (LOTRISONE) cream Apply 1 application topically 2 (two) times daily as needed. For dry skin      . colchicine (COLCRYS) 0.6 MG tablet Take 2 tablets [1.2 mg total] by mouth daily. Repeat 1 tablet [2] two hours later if needed.  10 tablet  2  .  losartan-hydrochlorothiazide (HYZAAR) 100-25 MG per tablet Take 1 tablet by mouth daily.  90 tablet  3   No current facility-administered medications for this visit.    Allergies Review of patient's allergies indicates no known allergies.  Family History: Family History  Problem Relation Age of Onset  . Cancer Father     throat and tongue  . Diabetes Maternal Uncle   . Heart attack Maternal Grandmother   . Heart disease Maternal Grandmother     CAD/MI  . Heart disease Maternal Grandfather   . Heart disease Mother   . Heart disease Maternal Aunt     CAD    Social History: History   Social History  . Marital Status: Single    Spouse Name: N/A    Number of Children: N/A  . Years of Education: 16   Occupational History  . self employed, Nurse, children's   .     Social History Main Topics  . Smoking status: Former Smoker -- 2.00 packs/day for 30 years    Types: Cigarettes    Quit date: 05/15/1984  . Smokeless tobacco: Not on file  . Alcohol Use: Yes     Comment: rare use  . Drug Use: No  . Sexual Activity: Not on file   Other Topics Concern  . Not on file   Social History Narrative   HSG, [place]   Single   Work: self employed...Nurse, children's   I-ADLs   End-of-Life: Yes-CPR, Yes-short-term Mechanical Ventilation, but no prolonged  ventilation. Is willing to undergo dialysis, prolonged tub feeding.   Pt states former smoker. 1986. 2ppd. Started age 47    Electrocardiogram:  06/06/12 SR rate 72 RBBB no change from 2013   Today SR rate 70 RBBB   Assessment and Plan

## 2013-04-09 NOTE — Patient Instructions (Signed)
The current medical regimen is effective;  continue present plan and medications.  Your physician has requested that you have an exercise tolerance test. For further information please visit [email or web address] Please also follow instruction sheet, as given.  Follow up in 1 year with Dr Eden Emms.  You will receive a letter in the mail 2 months before you are due.  Please call us when you receive this letter to schedule your follow up appointment.

## 2013-04-22 ENCOUNTER — Ambulatory Visit (HOSPITAL_COMMUNITY)
Admission: RE | Admit: 2013-04-22 | Discharge: 2013-04-22 | Disposition: A | Discharge status: Home / Self Care | Payer: Medicare Other | Source: Ambulatory Visit | Attending: Cardiovascular Disease | Admitting: Cardiovascular Disease

## 2013-04-22 DIAGNOSIS — I491 Atrial premature depolarization: Secondary | ICD-10-CM | POA: Insufficient documentation

## 2013-04-22 DIAGNOSIS — R079 Chest pain, unspecified: Secondary | ICD-10-CM

## 2013-05-26 ENCOUNTER — Telehealth: Payer: Self-pay | Admitting: Cardiovascular Disease

## 2013-05-26 NOTE — Telephone Encounter (Signed)
PT AWARE OF STRESS TEST RESULTS./CY 

## 2013-05-26 NOTE — Telephone Encounter (Signed)
New message    Want test results from December.  Pt says he never received his treadmill test results.

## 2013-05-29 ENCOUNTER — Ambulatory Visit: Payer: Medicare Other | Admitting: Internal Medicine

## 2013-06-03 ENCOUNTER — Other Ambulatory Visit (INDEPENDENT_AMBULATORY_CARE_PROVIDER_SITE_OTHER): Payer: Medicare Other

## 2013-06-03 ENCOUNTER — Ambulatory Visit (INDEPENDENT_AMBULATORY_CARE_PROVIDER_SITE_OTHER): Payer: Medicare Other | Admitting: Internal Medicine

## 2013-06-03 ENCOUNTER — Encounter: Payer: Self-pay | Admitting: Internal Medicine

## 2013-06-03 VITALS — BP 130/76 | HR 73 | Temp 97.5°F | Wt 274.0 lb

## 2013-06-03 DIAGNOSIS — E119 Type 2 diabetes mellitus without complications: Secondary | ICD-10-CM

## 2013-06-03 DIAGNOSIS — H9190 Unspecified hearing loss, unspecified ear: Secondary | ICD-10-CM

## 2013-06-03 DIAGNOSIS — M109 Gout, unspecified: Secondary | ICD-10-CM

## 2013-06-03 DIAGNOSIS — E669 Obesity, unspecified: Secondary | ICD-10-CM

## 2013-06-03 LAB — HEMOGLOBIN A1C: HEMOGLOBIN A1C: 8.2 % — AB (ref 4.6–6.5)

## 2013-06-03 LAB — URIC ACID: URIC ACID, SERUM: 5.6 mg/dL (ref 4.0–7.8)

## 2013-06-03 MED ORDER — CLOTRIMAZOLE-BETAMETHASONE 1-0.05 % EX CREA: 1.0000 "application " | TOPICAL_CREAM | Freq: Two times a day (BID) | CUTANEOUS | Status: DC | PRN

## 2013-06-03 NOTE — Assessment & Plan Note (Signed)
Diet and exercise - not so much.  Plan  A1C w/ recommendations to follow. Likely to be a candidate for metformin.

## 2013-06-03 NOTE — Patient Instructions (Signed)
1. Diabetes - will check an A1C with recommendations to follow. If the A1C is above 7% will need to start medical therapy.  2. Gout  Will check a uric acid level  3. Complete physical - looks like this is due. Will have schedulers set you up with Dr. Ronnald Ramp.

## 2013-06-03 NOTE — Assessment & Plan Note (Signed)
No meaningful weight loss.  Plan He has "re-upped" with Weight Watchers and is determined to loose weight. He has found a companion to go with him - reenforcement.

## 2013-06-03 NOTE — Progress Notes (Signed)
Pre visit review using our clinic review tool, if applicable. No additional management support is needed unless otherwise documented below in the visit note. 

## 2013-06-03 NOTE — Progress Notes (Signed)
Subjective:    Patient ID: Matthew Villanueva, male    DOB: 1936/03/06, 78 y.o.   MRN: [id number]  HPI Matthew Villanueva presents for follow up of diabetes diagnosed in Sept '14 with an A1C of 7.8%. He has been working on life-style management: NO SUGAR diet, reduced carbs. Unfortunately he has gained a little weight and has not developed an exercise program. He has not had thirst, polyuria or other overt signs of hyperglycemia.  Previous problem with paresthesia left leg has resolved.   He is due for annual wellness exam - to be deferred to his new physician.  Past Medical History  Diagnosis Date  . Right bundle branch block   . Other specified cardiac dysrhythmias(427.89)   . HTN (hypertension)   . Arthritis   . Dyspnea   . Diabetes mellitus   . Localized osteoarthrosis not specified whether primary or secondary, hand   . Gout   . History of colonic polyps   . OSA (obstructive sleep apnea)    Past Surgical History  Procedure Laterality Date  . Hemorrhoid surgery  1960  . Pilonidal cyst drainage    . Cardiac electrophysiology study and ablation  2004    ablation for SVT with a chronic right bundle branch block    Family History  Problem Relation Age of Onset  . Cancer Father     throat and tongue  . Diabetes Maternal Uncle   . Heart attack Maternal Grandmother   . Heart disease Maternal Grandmother     CAD/MI  . Heart disease Maternal Grandfather   . Heart disease Mother   . Heart disease Maternal Aunt     CAD   History   Social History  . Marital Status: Single    Spouse Name: N/A    Number of Children: N/A  . Years of Education: 16   Occupational History  . self employed, Recruitment consultant   .     Social History Main Topics  . Smoking status: Former Smoker -- 2.00 packs/day for 30 years    Types: Cigarettes    Quit date: 05/15/1984  . Smokeless tobacco: Not on file  . Alcohol Use: Yes     Comment: rare use  . Drug Use: No  . Sexual Activity: Not on file    Other Topics Concern  . Not on file   Social History Narrative   HSG, [place]   Single   Work: self employed...Recruitment consultant   I-ADLs   End-of-Life: Yes-CPR, Yes-short-term Mechanical Ventilation, but no prolonged ventilation. Is willing to undergo dialysis, prolonged tub feeding.   Pt states former smoker. 1986. 2ppd. Started age 17    Current Outpatient Prescriptions on File Prior to Visit  Medication Sig Dispense Refill  . aspirin 81 MG tablet Take 81 mg by mouth daily.        . colchicine (COLCRYS) 0.6 MG tablet Take 2 tablets [1.2 mg total] by mouth daily. Repeat 1 tablet [2] two hours later if needed.  10 tablet  2  . losartan-hydrochlorothiazide (HYZAAR) 100-25 MG per tablet Take 1 tablet by mouth daily.  90 tablet  3   No current facility-administered medications on file prior to visit.      Review of Systems System review is negative for any constitutional, cardiac, pulmonary, GI or neuro symptoms or complaints other than as described in the HPI.     Objective:   Physical Exam Filed Vitals:   06/03/13 1431  BP: 130/76  Pulse: 73  Temp: 97.5 F (36.4 C)   Wt Readings from Last 3 Encounters:  06/03/13 274 lb (124.286 kg)  04/09/13 270 lb 12.8 oz (122.834 kg)  01/20/13 271 lb 3.2 oz (123.016 kg)   BP Readings from Last 3 Encounters:  06/03/13 130/76  04/09/13 124/84  01/20/13 120/80   Gen'l - overweight man in no distress HEENT - C&S clear, PERRLA Cor - 2+ radial pulse, RRR PUlm - normal respirations.        Assessment & Plan:  Asked to schedule [facility]ness exam with Dr. Ronnald Ramp.

## 2013-06-23 ENCOUNTER — Encounter: Payer: Self-pay | Admitting: Internal Medicine

## 2013-06-23 ENCOUNTER — Ambulatory Visit (INDEPENDENT_AMBULATORY_CARE_PROVIDER_SITE_OTHER): Payer: Medicare Other | Admitting: Internal Medicine

## 2013-06-23 VITALS — BP 140/90 | HR 68 | Temp 98.2°F | Wt 277.2 lb

## 2013-06-23 DIAGNOSIS — E119 Type 2 diabetes mellitus without complications: Secondary | ICD-10-CM

## 2013-06-23 DIAGNOSIS — E669 Obesity, unspecified: Secondary | ICD-10-CM

## 2013-06-23 MED ORDER — METFORMIN HCL 500 MG PO TABS: 500.0000 mg | ORAL_TABLET | Freq: Two times a day (BID) | ORAL | Status: DC

## 2013-06-23 NOTE — Progress Notes (Signed)
Pre visit review using our clinic review tool, if applicable. No additional management support is needed unless otherwise documented below in the visit note. 

## 2013-06-23 NOTE — Patient Instructions (Signed)
Good to see you. We will schedule a follow up appointment in 3 months with Dr. Jenny Reichmann.  Diabetes management - please resume you weight watchers. If you loose 10 % of your body weight = 27 lbs, we can consider a drug holiday. For now resume the metformin 500 mg twice a day. Return after May 11th for lab and an appointment to see Dr. Jenny Reichmann.

## 2013-06-24 NOTE — Assessment & Plan Note (Signed)
Continuing problem with weight management. The importance of loosing weight was reemphasized.  Plan Return to weight watchers for management.

## 2013-06-24 NOTE — Progress Notes (Signed)
Subjective:    Patient ID: Matthew Villanueva, male    DOB: 1935-10-21, 78 y.o.   MRN: [id number]  HPI Matthew Villanueva presents for follow up of diabetes with his last A1C at 8.2%. He admits to dietary indiscretion and weight gain after having lost 50 lbs using weight watchers. He is asymptomatic.  Past Medical History  Diagnosis Date  . Right bundle branch block   . Other specified cardiac dysrhythmias(427.89)   . HTN (hypertension)   . Arthritis   . Dyspnea   . Diabetes mellitus   . Localized osteoarthrosis not specified whether primary or secondary, hand   . Gout   . History of colonic polyps   . OSA (obstructive sleep apnea)    Past Surgical History  Procedure Laterality Date  . Hemorrhoid surgery  1960  . Pilonidal cyst drainage    . Cardiac electrophysiology study and ablation  2004    ablation for SVT with a chronic right bundle branch block    Family History  Problem Relation Age of Onset  . Cancer Father     throat and tongue  . Diabetes Maternal Uncle   . Heart attack Maternal Grandmother   . Heart disease Maternal Grandmother     CAD/MI  . Heart disease Maternal Grandfather   . Heart disease Mother   . Heart disease Maternal Aunt     CAD   History   Social History  . Marital Status: Single    Spouse Name: N/A    Number of Children: N/A  . Years of Education: 16   Occupational History  . self employed, Recruitment consultant   .     Social History Main Topics  . Smoking status: Former Smoker -- 2.00 packs/day for 30 years    Types: Cigarettes    Quit date: 05/15/1984  . Smokeless tobacco: Not on file  . Alcohol Use: Yes     Comment: rare use  . Drug Use: No  . Sexual Activity: Not on file   Other Topics Concern  . Not on file   Social History Narrative   HSG, [place]   Single   Work: self employed...Recruitment consultant   I-ADLs   End-of-Life: Yes-CPR, Yes-short-term Mechanical Ventilation, but no prolonged ventilation. Is willing to undergo  dialysis, prolonged tub feeding.   Pt states former smoker. 1986. 2ppd. Started age 59    Current Outpatient Prescriptions on File Prior to Visit  Medication Sig Dispense Refill  . aspirin 81 MG tablet Take 81 mg by mouth daily.        . clotrimazole-betamethasone (LOTRISONE) cream Apply 1 application topically 2 (two) times daily as needed. For dry skin  30 g  2  . colchicine (COLCRYS) 0.6 MG tablet Take 2 tablets [1.2 mg total] by mouth daily. Repeat 1 tablet [2] two hours later if needed.  10 tablet  2  . losartan-hydrochlorothiazide (HYZAAR) 100-25 MG per tablet Take 1 tablet by mouth daily.  90 tablet  3   No current facility-administered medications on file prior to visit.      Review of Systems System review is negative for any constitutional, cardiac, pulmonary, GI or neuro symptoms or complaints other than as described in the HPI.     Objective:   Physical Exam Filed Vitals:   06/23/13 0929  BP: 140/90  Pulse: 68  Temp: 98.2 F (36.8 C)   Wt Readings from Last 3 Encounters:  06/23/13 277 lb 3.2 oz (125.737 kg)  06/03/13 274 lb (124.286 kg)  04/09/13 270 lb 12.8 oz (122.834 kg)   Gen'l - overweight man in no distress HEENT- C&S clear Cor - RRR Pulm - normal respirations Neuro - A&O x 3, normal gait and station.       Assessment & Plan:

## 2013-06-24 NOTE — Assessment & Plan Note (Signed)
Lab Results  Component Value Date   HGBA1C 8.2* 06/03/2013   Plan Resume metformin 500 mg bid  Return to weight watchers - for 10% reduction in weight may consider repeat drug holiday.

## 2013-06-26 ENCOUNTER — Telehealth: Payer: Self-pay

## 2013-06-26 NOTE — Telephone Encounter (Signed)
Relevant patient education assigned to patient using Emmi. ° °

## 2013-07-08 ENCOUNTER — Telehealth: Payer: Self-pay | Admitting: *Deleted

## 2013-07-08 ENCOUNTER — Other Ambulatory Visit: Payer: Self-pay | Admitting: Internal Medicine

## 2013-07-08 NOTE — Telephone Encounter (Signed)
Patient phoned requesting a medication for gout. Colchicine 0.6 mg on MAR (not ordered since 2013).  Reorder?  Please advise.  CB# [phone]

## 2013-07-08 NOTE — Telephone Encounter (Signed)
Last uric acid level was normal. Plan Rx for Colchicine 0.6 mg at onset of acute gout take 2 tablets, may take 1 additional tablet at 2 hrs (max 3 tablets/24 hrs). May repeat if needed the next day.  For unrelieved pain may take 2 aleve tablets every 12 hrs.

## 2013-07-08 NOTE — Telephone Encounter (Signed)
Phoned patient and left voicemail message relaying MD's response and prescription.

## 2013-07-29 ENCOUNTER — Ambulatory Visit (INDEPENDENT_AMBULATORY_CARE_PROVIDER_SITE_OTHER): Payer: Medicare Other | Admitting: Internal Medicine

## 2013-07-29 ENCOUNTER — Encounter: Payer: Self-pay | Admitting: Internal Medicine

## 2013-07-29 VITALS — BP 140/80 | HR 70 | Temp 98.0°F | Ht 70.0 in | Wt 277.0 lb

## 2013-07-29 DIAGNOSIS — E119 Type 2 diabetes mellitus without complications: Secondary | ICD-10-CM

## 2013-07-29 DIAGNOSIS — H918X9 Other specified hearing loss, unspecified ear: Secondary | ICD-10-CM

## 2013-07-29 DIAGNOSIS — M109 Gout, unspecified: Secondary | ICD-10-CM

## 2013-07-29 DIAGNOSIS — H6120 Impacted cerumen, unspecified ear: Secondary | ICD-10-CM

## 2013-07-29 NOTE — Assessment & Plan Note (Signed)
Complaint of left foot pain - gout? Painful foot at MTP joints w/o erythema and w/o exquisite pain. Able to palpate w/o discomfort. Uric acid level is down. 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

## 2013-07-29 NOTE — Patient Instructions (Signed)
1. Ear wax blockage right - cleared easily with irrigation. Left was already pretty clear. Residual decreased hearing with fullness may be mild eustachian tube dysfuntion.  Plan  Generic sudafed 30 mg twice a day for one or two days  2. Foot pain - does not look like gout but more likely arthritic at the joint between the foot bones and the toes (MTP joints). Lat uric acid level was normal.   Plan Good supportive shoes  Soothing foot bath  otc NSAID of choice

## 2013-07-29 NOTE — Assessment & Plan Note (Signed)
Tolerating Metformin. CBGs better.   Plan A1C when due. Recommendations to follow.

## 2013-07-29 NOTE — Progress Notes (Signed)
Pre visit review using our clinic review tool, if applicable. No additional management support is needed unless otherwise documented below in the visit note. 

## 2013-07-29 NOTE — Progress Notes (Signed)
   Subjective:    Patient ID: Matthew Villanueva, male    DOB: 04-30-1936, 78 y.o.   MRN: [id number]  HPI Matthew Villanueva presents for evaluation of cerumen impaction. He also has had a flare of left foot pain across the MTP joints.  PMH, FamHx and SocHx reviewed for any changes and relevance.  Current Outpatient Prescriptions on File Prior to Visit  Medication Sig Dispense Refill  . aspirin 81 MG tablet Take 81 mg by mouth daily.        . clotrimazole-betamethasone (LOTRISONE) cream Apply 1 application topically 2 (two) times daily as needed. For dry skin  30 g  2  . COLCRYS 0.6 MG tablet TAKE 2 TABLETS (1.2 MG TOTAL) BY MOUTH DAILY. REPEAT 1 TABLET 2 HRS LATER IF NEEDED  12 tablet  2  . losartan-hydrochlorothiazide (HYZAAR) 100-25 MG per tablet Take 1 tablet by mouth daily.  90 tablet  3  . metFORMIN (GLUCOPHAGE) 500 MG tablet Take 1 tablet (500 mg total) by mouth 2 (two) times daily with a meal.  180 tablet  3   No current facility-administered medications on file prior to visit.      Review of Systems System review is negative for any constitutional, cardiac, pulmonary, GI or neuro symptoms or complaints other than as described in the HPI.     Objective:   Physical Exam Filed Vitals:   07/29/13 0809  BP: 140/80  Pulse: 70  Temp: 98 F (36.7 C)   Gen'l- overweight man in no distress HEENT - Right EAC with cerumen impaction; left EAC with minimal wax Cor - RRR Pulm - normal MSK - left foot w/o erythema, heat and with palpation minimal pain Neuro - pretty good hearing to 256Hz  fork bilaterally, weber lateralizes to the right  Ear irrigation - using ear syringe easily irrigated right EAC - TMs was normal. Hearing recovered       Assessment & Plan:  Cerumen impaction - cleared

## 2013-08-12 ENCOUNTER — Other Ambulatory Visit (INDEPENDENT_AMBULATORY_CARE_PROVIDER_SITE_OTHER): Payer: Medicare Other

## 2013-08-12 ENCOUNTER — Ambulatory Visit (INDEPENDENT_AMBULATORY_CARE_PROVIDER_SITE_OTHER): Payer: Medicare Other | Admitting: Internal Medicine

## 2013-08-12 ENCOUNTER — Encounter: Payer: Self-pay | Admitting: Internal Medicine

## 2013-08-12 DIAGNOSIS — E66812 Obesity, class 2: Secondary | ICD-10-CM

## 2013-08-12 DIAGNOSIS — E1165 Type 2 diabetes mellitus with hyperglycemia: Principal | ICD-10-CM

## 2013-08-12 DIAGNOSIS — Z8673 Personal history of transient ischemic attack (TIA), and cerebral infarction without residual deficits: Secondary | ICD-10-CM

## 2013-08-12 DIAGNOSIS — E669 Obesity, unspecified: Secondary | ICD-10-CM

## 2013-08-12 DIAGNOSIS — Z23 Encounter for immunization: Secondary | ICD-10-CM

## 2013-08-12 DIAGNOSIS — I10 Essential (primary) hypertension: Secondary | ICD-10-CM

## 2013-08-12 DIAGNOSIS — N401 Enlarged prostate with lower urinary tract symptoms: Secondary | ICD-10-CM

## 2013-08-12 DIAGNOSIS — R351 Nocturia: Secondary | ICD-10-CM

## 2013-08-12 DIAGNOSIS — E1129 Type 2 diabetes mellitus with other diabetic kidney complication: Secondary | ICD-10-CM

## 2013-08-12 DIAGNOSIS — Z0000 Encounter for general adult medical examination without abnormal findings: Secondary | ICD-10-CM | POA: Insufficient documentation

## 2013-08-12 DIAGNOSIS — N138 Other obstructive and reflux uropathy: Secondary | ICD-10-CM

## 2013-08-12 DIAGNOSIS — E785 Hyperlipidemia, unspecified: Secondary | ICD-10-CM

## 2013-08-12 LAB — URINALYSIS, ROUTINE W REFLEX MICROSCOPIC
BILIRUBIN URINE: NEGATIVE
HGB URINE DIPSTICK: NEGATIVE
Ketones, ur: NEGATIVE
Leukocytes, UA: NEGATIVE
NITRITE: NEGATIVE
PH: 6 (ref 5.0–8.0)
RBC / HPF: NONE SEEN (ref 0–?)
Specific Gravity, Urine: 1.025 (ref 1.000–1.030)
URINE GLUCOSE: NEGATIVE
Urobilinogen, UA: 0.2 (ref 0.0–1.0)

## 2013-08-12 LAB — CBC WITH DIFFERENTIAL/PLATELET
BASOS PCT: 0.4 % (ref 0.0–3.0)
Basophils Absolute: 0 10*3/uL (ref 0.0–0.1)
EOS PCT: 1 % (ref 0.0–5.0)
Eosinophils Absolute: 0.1 10*3/uL (ref 0.0–0.7)
HCT: 47.1 % (ref 39.0–52.0)
Hemoglobin: 15.9 g/dL (ref 13.0–17.0)
Lymphocytes Relative: 17.5 % (ref 12.0–46.0)
Lymphs Abs: 1.8 10*3/uL (ref 0.7–4.0)
MCHC: 33.7 g/dL (ref 30.0–36.0)
MCV: 88 fl (ref 78.0–100.0)
Monocytes Absolute: 0.7 10*3/uL (ref 0.1–1.0)
Monocytes Relative: 7.3 % (ref 3.0–12.0)
Neutro Abs: 7.6 10*3/uL (ref 1.4–7.7)
Neutrophils Relative %: 73.8 % (ref 43.0–77.0)
Platelets: 240 10*3/uL (ref 150.0–400.0)
RBC: 5.35 Mil/uL (ref 4.22–5.81)
RDW: 14.8 % — ABNORMAL HIGH (ref 11.5–14.6)
WBC: 10.3 10*3/uL (ref 4.5–10.5)

## 2013-08-12 LAB — COMPREHENSIVE METABOLIC PANEL
ALK PHOS: 17 U/L — AB (ref 39–117)
ALT: 43 U/L (ref 0–53)
AST: 27 U/L (ref 0–37)
Albumin: 4.3 g/dL (ref 3.5–5.2)
BUN: 19 mg/dL (ref 6–23)
CO2: 29 mEq/L (ref 19–32)
CREATININE: 1.1 mg/dL (ref 0.4–1.5)
Calcium: 9.7 mg/dL (ref 8.4–10.5)
Chloride: 102 mEq/L (ref 96–112)
GFR: 73.09 mL/min (ref >60.00)
Glucose, Bld: 142 mg/dL — ABNORMAL HIGH (ref 70–99)
POTASSIUM: 4.2 meq/L (ref 3.5–5.1)
Sodium: 139 mEq/L (ref 135–145)
Total Bilirubin: 0.8 mg/dL (ref 0.3–1.2)
Total Protein: 7.1 g/dL (ref 6.0–8.3)

## 2013-08-12 LAB — LIPID PANEL
Cholesterol: 141 mg/dL (ref 0–200)
HDL: 37.6 mg/dL — AB (ref >39.00)
LDL Cholesterol: 72 mg/dL (ref 0–99)
Total CHOL/HDL Ratio: 4
Triglycerides: 155 mg/dL — ABNORMAL HIGH (ref 0.0–149.0)
VLDL: 31 mg/dL (ref 0.0–40.0)

## 2013-08-12 LAB — HEMOGLOBIN A1C: Hgb A1c MFr Bld: 7.5 % — ABNORMAL HIGH (ref 4.6–6.5)

## 2013-08-12 LAB — TSH: TSH: 1.75 u[IU]/mL (ref 0.35–5.50)

## 2013-08-12 LAB — HM DIABETES FOOT EXAM

## 2013-08-12 LAB — FECAL OCCULT BLOOD, GUAIAC: Fecal Occult Blood: NEGATIVE

## 2013-08-12 LAB — PSA: PSA: 1.88 ng/mL (ref 0.10–4.00)

## 2013-08-12 MED ORDER — ROSUVASTATIN CALCIUM 5 MG PO TABS: 5.0000 mg | ORAL_TABLET | Freq: Every day | ORAL | Status: DC

## 2013-08-12 NOTE — Progress Notes (Signed)
Subjective:    Patient ID: Matthew Villanueva, male    DOB: 1936/04/27, 78 y.o.   MRN: [id number]  Diabetes He presents for his follow-up diabetic visit. He has type 2 diabetes mellitus. His disease course has been improving. There are no hypoglycemic associated symptoms. Pertinent negatives for hypoglycemia include no dizziness, headaches or tremors. Pertinent negatives for diabetes include no blurred vision, no chest pain, no fatigue, no foot paresthesias, no foot ulcerations, no polydipsia, no polyphagia, no polyuria, no visual change, no weakness and no weight loss. There are no hypoglycemic complications. There are no diabetic complications. Current diabetic treatment includes oral agent (monotherapy). He is compliant with treatment all of the time. His weight is stable. He is following a generally healthy diet. Meal planning includes avoidance of concentrated sweets. He has not had a previous visit with a dietician. He never participates in exercise. There is no change in his home blood glucose trend. An ACE inhibitor/angiotensin II receptor blocker is being taken. He does not see a podiatrist.Eye exam is current.      Review of Systems  Constitutional: Negative.  Negative for fever, chills, weight loss, diaphoresis, appetite change, fatigue and unexpected weight change.  HENT: Negative.   Eyes: Negative.  Negative for blurred vision.  Respiratory: Positive for apnea. Negative for cough, choking, chest tightness, shortness of breath, wheezing and stridor.   Cardiovascular: Negative.  Negative for chest pain, palpitations and leg swelling.  Gastrointestinal: Negative.  Negative for nausea, vomiting, abdominal pain, diarrhea, constipation and blood in stool.  Endocrine: Negative.  Negative for polydipsia, polyphagia and polyuria.  Genitourinary: Negative.   Musculoskeletal: Negative.   Skin: Negative.   Allergic/Immunologic: Negative.   Neurological: Negative.  Negative for dizziness,  tremors, facial asymmetry, weakness, light-headedness and headaches.  Hematological: Negative.  Negative for adenopathy.  Psychiatric/Behavioral: Negative.        Objective:   Physical Exam  Vitals reviewed. Constitutional: He is oriented to person, place, and time. He appears well-developed and well-nourished. No distress.  HENT:  Head: Normocephalic and atraumatic.  Mouth/Throat: Oropharynx is clear and moist. No oropharyngeal exudate.  Eyes: Conjunctivae are normal. Right eye exhibits no discharge. Left eye exhibits no discharge. No scleral icterus.  Neck: Normal range of motion. Neck supple. No JVD present. No tracheal deviation present. No thyromegaly present.  Cardiovascular: Normal rate, regular rhythm, normal heart sounds and intact distal pulses.  Exam reveals no gallop and no friction rub.   No murmur heard. Pulmonary/Chest: Effort normal and breath sounds normal. No stridor. No respiratory distress. He has no wheezes. He has no rales. He exhibits no tenderness.  Abdominal: Soft. Bowel sounds are normal. He exhibits no distension and no mass. There is no tenderness. There is no rebound and no guarding. Hernia confirmed negative in the right inguinal area and confirmed negative in the left inguinal area.  Genitourinary: Rectum normal, testes normal and penis normal. Rectal exam shows no external hemorrhoid, no internal hemorrhoid, no fissure, no mass, no tenderness and anal tone normal. Guaiac negative stool. Prostate is enlarged (1+ smooth symm BPH). Prostate is not tender. Right testis shows no mass, no swelling and no tenderness. Right testis is descended. Left testis shows no mass, no swelling and no tenderness. Left testis is descended. Uncircumcised. No phimosis, paraphimosis, hypospadias, penile erythema or penile tenderness. No discharge found.  Musculoskeletal: Normal range of motion. He exhibits no edema and no tenderness.  Lymphadenopathy:    He has no cervical adenopathy.  Right: No inguinal adenopathy present.       Left: No inguinal adenopathy present.  Neurological: He is oriented to person, place, and time.  Skin: Skin is warm and dry. No rash noted. He is not diaphoretic. No erythema. No pallor.  Psychiatric: He has a normal mood and affect. His behavior is normal. Judgment and thought content normal.     Lab Results  Component Value Date   WBC 9.9 06/06/2012   HGB 15.7 06/06/2012   HCT 46.3 06/06/2012   PLT 245.0 06/06/2012   GLUCOSE 180* 01/20/2013   CHOL 127 08/21/2011   TRIG 99.0 08/21/2011   HDL 36.70* 08/21/2011   LDLCALC 71 08/21/2011   ALT 31 01/20/2013   AST 22 01/20/2013   NA 137 01/20/2013   K 4.1 01/20/2013   CL 102 01/20/2013   CREATININE 1.0 01/20/2013   BUN 12 01/20/2013   CO2 28 01/20/2013   TSH 1.73 01/20/2013   PSA 0.73 04/29/2010   HGBA1C 8.2* 06/03/2013   MICROALBUR 4.0* 12/13/2011       Assessment & Plan:

## 2013-08-12 NOTE — Patient Instructions (Signed)
Type 2 Diabetes Mellitus, Adult Type 2 diabetes mellitus, often simply referred to as type 2 diabetes, is a long-lasting (chronic) disease. In type 2 diabetes, the pancreas does not make enough insulin (a hormone), the cells are less responsive to the insulin that is made (insulin resistance), or both. Normally, insulin moves sugars from food into the tissue cells. The tissue cells use the sugars for energy. The lack of insulin or the lack of normal response to insulin causes excess sugars to build up in the blood instead of going into the tissue cells. As a result, high blood sugar (hyperglycemia) develops. The effect of high sugar (glucose) levels can cause many complications. Type 2 diabetes was also previously called adult-onset diabetes but it can occur at any age.  RISK FACTORS  A person is predisposed to developing type 2 diabetes if someone in the family has the disease and also has one or more of the following primary risk factors:  Overweight.  An inactive lifestyle.  A history of consistently eating high-calorie foods. Maintaining a normal weight and regular physical activity can reduce the chance of developing type 2 diabetes. SYMPTOMS  A person with type 2 diabetes may not show symptoms initially. The symptoms of type 2 diabetes appear slowly. The symptoms include:  Increased thirst (polydipsia).  Increased urination (polyuria).  Increased urination during the night (nocturia).  Weight loss. This weight loss may be rapid.  Frequent, recurring infections.  Tiredness (fatigue).  Weakness.  Vision changes, such as blurred vision.  Fruity smell to your breath.  Abdominal pain.  Nausea or vomiting.  Cuts or bruises which are slow to heal.  Tingling or numbness in the hands or feet. DIAGNOSIS Type 2 diabetes is frequently not diagnosed until complications of diabetes are present. Type 2 diabetes is diagnosed when symptoms or complications are present and when blood  glucose levels are increased. Your blood glucose level may be checked by one or more of the following blood tests:  A fasting blood glucose test. You will not be allowed to eat for at least 8 hours before a blood sample is taken.  A random blood glucose test. Your blood glucose is checked at any time of the day regardless of when you ate.  A hemoglobin A1c blood glucose test. A hemoglobin A1c test provides information about blood glucose control over the previous 3 months.  An oral glucose tolerance test (OGTT). Your blood glucose is measured after you have not eaten (fasted) for 2 hours and then after you drink a glucose-containing beverage. TREATMENT   You may need to take insulin or diabetes medicine daily to keep blood glucose levels in the desired range.  You will need to match insulin dosing with exercise and healthy food choices. The treatment goal is to maintain the before meal blood sugar (preprandial glucose) level at 70 130 mg/dL. HOME CARE INSTRUCTIONS   Have your hemoglobin A1c level checked twice a year.  Perform daily blood glucose monitoring as directed by your caregiver.  Monitor urine ketones when you are ill and as directed by your caregiver.  Take your diabetes medicine or insulin as directed by your caregiver to maintain your blood glucose levels in the desired range.  Never run out of diabetes medicine or insulin. It is needed every day.  Adjust insulin based on your intake of carbohydrates. Carbohydrates can raise blood glucose levels but need to be included in your diet. Carbohydrates provide vitamins, minerals, and fiber which are an essential part of   a healthy diet. Carbohydrates are found in fruits, vegetables, whole grains, dairy products, legumes, and foods containing added sugars.    Eat healthy foods. Alternate 3 meals with 3 snacks.  Lose weight if overweight.  Carry a medical alert card or wear your medical alert jewelry.  Carry a 15 gram  carbohydrate snack with you at all times to treat low blood glucose (hypoglycemia). Some examples of 15 gram carbohydrate snacks include:  Glucose tablets, 3 or 4   Glucose gel, 15 gram tube  Raisins, 2 tablespoons (24 grams)  Jelly beans, 6  Animal crackers, 8  Regular pop, 4 ounces (120 mL)  Gummy treats, 9  Recognize hypoglycemia. Hypoglycemia occurs with blood glucose levels of 70 mg/dL and below. The risk for hypoglycemia increases when fasting or skipping meals, during or after intense exercise, and during sleep. Hypoglycemia symptoms can include:  Tremors or shakes.  Decreased ability to concentrate.  Sweating.  Increased heart rate.  Headache.  Dry mouth.  Hunger.  Irritability.  Anxiety.  Restless sleep.  Altered speech or coordination.  Confusion.  Treat hypoglycemia promptly. If you are alert and able to safely swallow, follow the 15:15 rule:  Take 15 20 grams of rapid-acting glucose or carbohydrate. Rapid-acting options include glucose gel, glucose tablets, or 4 ounces (120 mL) of fruit juice, regular soda, or low fat milk.  Check your blood glucose level 15 minutes after taking the glucose.  Take 15 20 grams more of glucose if the repeat blood glucose level is still 70 mg/dL or below.  Eat a meal or snack within 1 hour once blood glucose levels return to normal.    Be alert to polyuria and polydipsia which are early signs of hyperglycemia. An early awareness of hyperglycemia allows for prompt treatment. Treat hyperglycemia as directed by your caregiver.  Engage in at least 150 minutes of moderate-intensity physical activity a week, spread over at least 3 days of the week or as directed by your caregiver. In addition, you should engage in resistance exercise at least 2 times a week or as directed by your caregiver.  Adjust your medicine and food intake as needed if you start a new exercise or sport.  Follow your sick day plan at any time you  are unable to eat or drink as usual.  Avoid tobacco use.  Limit alcohol intake to no more than 1 drink per day for nonpregnant women and 2 drinks per day for men. You should drink alcohol only when you are also eating food. Talk with your caregiver whether alcohol is safe for you. Tell your caregiver if you drink alcohol several times a week.  Follow up with your caregiver regularly.  Schedule an eye exam soon after the diagnosis of type 2 diabetes and then annually.  Perform daily skin and foot care. Examine your skin and feet daily for cuts, bruises, redness, nail problems, bleeding, blisters, or sores. A foot exam by a caregiver should be done annually.  Brush your teeth and gums at least twice a day and floss at least once a day. Follow up with your dentist regularly.  Share your diabetes management plan with your workplace or school.  Stay up-to-date with immunizations.  Learn to manage stress.  Obtain ongoing diabetes education and support as needed.  Participate in, or seek rehabilitation as needed to maintain or improve independence and quality of life. Request a physical or occupational therapy referral if you are having foot or hand numbness or difficulties with grooming,   dressing, eating, or physical activity. SEEK MEDICAL CARE IF:   You are unable to eat food or drink fluids for more than 6 hours.  You have nausea and vomiting for more than 6 hours.  Your blood glucose level is over 240 mg/dL.  There is a change in mental status.  You develop an additional serious illness.  You have diarrhea for more than 6 hours.  You have been sick or have had a fever for a couple of days and are not getting better.  You have pain during any physical activity.  SEEK IMMEDIATE MEDICAL CARE IF:  You have difficulty breathing.  You have moderate to large ketone levels. MAKE SURE YOU:  Understand these instructions.  Will watch your condition.  Will get help right away if  you are not doing well or get worse. Document Released: 05/01/2005 Document Revised: 01/24/2012 Document Reviewed: 11/28/2011 [facility] Patient Information 2014 [place]. Health Maintenance, Males A healthy lifestyle and preventative care can promote health and wellness.  Maintain regular health, dental, and eye exams.  Eat a healthy diet. Foods like vegetables, fruits, whole grains, low-fat dairy products, and lean protein foods contain the nutrients you need and are low in calories. Decrease your intake of foods high in solid fats, added sugars, and salt. Get information about a proper diet from your health care provider, if necessary.  Regular physical exercise is one of the most important things you can do for your health. Most adults should get at least 150 minutes of moderate-intensity exercise (any activity that increases your heart rate and causes you to sweat) each week. In addition, most adults need muscle-strengthening exercises on 2 or more days a week.   Maintain a healthy weight. The body mass index (BMI) is a screening tool to identify possible weight problems. It provides an estimate of body fat based on height and weight. Your health care provider can find your BMI and can help you achieve or maintain a healthy weight. For males 20 years and older:  A BMI below 18.5 is considered underweight.  A BMI of 18.5 to 24.9 is normal.  A BMI of 25 to 29.9 is considered overweight.  A BMI of 30 and above is considered obese.  Maintain normal blood lipids and cholesterol by exercising and minimizing your intake of saturated fat. Eat a balanced diet with plenty of fruits and vegetables. Blood tests for lipids and cholesterol should begin at age 16 and be repeated every 5 years. If your lipid or cholesterol levels are high, you are over 50, or you are at high risk for heart disease, you may need your cholesterol levels checked more frequently.Ongoing high lipid and cholesterol  levels should be treated with medicines, if diet and exercise are not working.  If you smoke, find out from your health care provider how to quit. If you do not use tobacco, do not start.  Lung cancer screening is recommended for adults aged 9 80 years who are at high risk for developing lung cancer because of a history of smoking. A yearly low-dose CT scan of the lungs is recommended for people who have at least a 30-pack-year history of smoking and are a current smoker or have quit within the past 15 years. A pack year of smoking is smoking an average of 1 pack of cigarettes a day for 1 year (for example, a 30-pack-year history of smoking could mean smoking 1 pack a day for 30 years or 2 packs a day  for 15 years). Yearly screening should continue until the smoker has stopped smoking for at least 15 years. Yearly screening should be stopped for people who develop a health problem that would prevent them from having lung cancer treatment.  If you choose to drink alcohol, do not have more than 2 drinks per day. One drink is considered to be 12 oz (360 mL) of beer, 5 oz (150 mL) of wine, or 1.5 oz (45 mL) of liquor.  Avoid use of street drugs. Do not share needles with anyone. Ask for help if you need support or instructions about stopping the use of drugs.  High blood pressure causes heart disease and increases the risk of stroke. Blood pressure should be checked at least every 1 2 years. Ongoing high blood pressure should be treated with medicines if weight loss and exercise are not effective.  If you are 73 76 years old, ask your health care provider if you should take aspirin to prevent heart disease.  Diabetes screening involves taking a blood sample to check your fasting blood sugar level. This should be done once every 3 years after age 80, if you are at a normal weight and without risk factors for diabetes. Testing should be considered at a younger age or be carried out more frequently if you  are overweight and have at least 1 risk factor for diabetes.  Colorectal cancer can be detected and often prevented. Most routine colorectal cancer screening begins at the age of 44 and continues through age 40. However, your health care provider may recommend screening at an earlier age if you have risk factors for colon cancer. On a yearly basis, your health care provider may provide home test kits to check for hidden blood in the stool. A small camera at the end of a tube may be used to directly examine the colon (sigmoidoscopy or colonoscopy) to detect the earliest forms of colorectal cancer. Talk to your health care provider about this at age 74, when routine screening begins. A direct exam of the colon should be repeated every 5 10 years through age 64, unless early forms of pre-cancerous polyps or small growths are found.  People who are at an increased risk for hepatitis B should be screened for this virus. You are considered at high risk for hepatitis B if:  You were born in a country where hepatitis B occurs often. Talk with your health care provider about which countries are considered high-risk.  Your parents were born in a high-risk country and you have not received a shot to protect against hepatitis B (hepatitis B vaccine).  You have HIV or AIDS.  You use needles to inject street drugs.  You live with, or have sex with, someone who has hepatitis B.  You are a man who has sex with other men (MSM).  You get hemodialysis treatment.  You take certain medicines for conditions like cancer, organ transplantation, and autoimmune conditions.  Hepatitis C blood testing is recommended for all people born from [birth year] through 1963 and any individual with known risk factors for hepatitis C.  Healthy men should no longer receive prostate-specific antigen (PSA) blood tests as part of routine cancer screening. Talk to your health care provider about prostate cancer screening.  Testicular cancer  screening is not recommended for adolescents or adult males who have no symptoms. Screening includes self-exam, a health care provider exam, and other screening tests. Consult with your health care provider about any symptoms you have or  any concerns you have about testicular cancer.  Practice safe sex. Use condoms and avoid high-risk sexual practices to reduce the spread of sexually transmitted infections (STIs).  Use sunscreen. Apply sunscreen liberally and repeatedly throughout the day. You should seek shade when your shadow is shorter than you. Protect yourself by wearing long sleeves, pants, a wide-brimmed hat, and sunglasses year round, whenever you are outdoors.  Tell your health care provider of new moles or changes in moles, especially if there is a change in shape or color. Also tell your provider if a mole is larger than the size of a pencil eraser.  A one-time screening for abdominal aortic aneurysm (AAA) and surgical repair of large AAAs by ultrasound is recommended for men aged 47 75 years who are current or former smokers.  Stay current with your vaccines (immunizations). Document Released: 10/28/2007 Document Revised: 02/19/2013 Document Reviewed: 09/26/2010 [facility] Patient Information 2014 [place], [place].

## 2013-08-13 NOTE — Assessment & Plan Note (Signed)
I have asked him to start crestor for risk reduction

## 2013-08-13 NOTE — Assessment & Plan Note (Signed)
Will improve his risk reduction by starting a statin He will cont to control his BP and BS

## 2013-08-13 NOTE — Assessment & Plan Note (Addendum)
The patient is here for annual Medicare wellness examination and management of other chronic and acute problems.   The risk factors are reflected in the social history.  The roster of all physicians providing medical care to patient - is listed in the Snapshot section of the chart.  Activities of daily living:  The patient is 100% inedpendent in all ADLs: dressing, toileting, feeding as well as independent mobility  Home safety : The patient has smoke detectors in the home. They wear seatbelts.No firearms at home ( firearms are present in the home, kept in a safe fashion). There is no violence in the home.   There is no risks for hepatitis, STDs or HIV. There is no   history of blood transfusion. They have no travel history to infectious disease endemic areas of the world.  The patient has (has not) seen their dentist in the last six month. They have (not) seen their eye doctor in the last year. They deny (admit to) any hearing difficulty and have not had audiologic testing in the last year.  They do not  have excessive sun exposure. Discussed the need for sun protection: hats, long sleeves and use of sunscreen if there is significant sun exposure.   Diet: the importance of a healthy diet is discussed. They do have a healthy (unhealthy-high fat/fast food) diet.  The patient does not have a regular exercise program.  The benefits of regular aerobic exercise were discussed.  Depression screen: there are no signs or vegative symptoms of depression- irritability, change in appetite, anhedonia, sadness/tearfullness.  Cognitive assessment: the patient manages all their financial and personal affairs and is actively engaged. They could relate day,date,year and events; recalled 3/3 objects at 3 minutes; performed clock-face test normally.  The following portions of the patient's history were reviewed and updated as appropriate: allergies, current medications, past family history, past medical history,   past surgical history, past social history  and problem list.  Vision, hearing, body mass index were assessed and reviewed.   During the course of the visit the patient was educated and counseled about appropriate screening and preventive services including : fall prevention , diabetes screening, nutrition counseling, colorectal cancer screening, and recommended immunizations.  

## 2013-08-13 NOTE — Assessment & Plan Note (Signed)
His A1C has improved Will cont metformin, he will work to improve his lifestyle modifications

## 2013-08-13 NOTE — Assessment & Plan Note (Signed)
His BP is well controlled 

## 2013-08-13 NOTE — Assessment & Plan Note (Signed)
His PSA is normal and he has no s/s that need to be treated

## 2013-09-22 ENCOUNTER — Ambulatory Visit (INDEPENDENT_AMBULATORY_CARE_PROVIDER_SITE_OTHER): Payer: Medicare Other | Admitting: Internal Medicine

## 2013-09-22 ENCOUNTER — Encounter: Payer: Self-pay | Admitting: Internal Medicine

## 2013-09-22 VITALS — BP 136/82 | HR 69 | Temp 97.5°F | Resp 16 | Ht 70.0 in | Wt 277.2 lb

## 2013-09-22 DIAGNOSIS — L259 Unspecified contact dermatitis, unspecified cause: Secondary | ICD-10-CM

## 2013-09-22 DIAGNOSIS — H9190 Unspecified hearing loss, unspecified ear: Secondary | ICD-10-CM

## 2013-09-22 DIAGNOSIS — L300 Nummular dermatitis: Secondary | ICD-10-CM | POA: Insufficient documentation

## 2013-09-22 DIAGNOSIS — M19019 Primary osteoarthritis, unspecified shoulder: Secondary | ICD-10-CM

## 2013-09-22 DIAGNOSIS — M109 Gout, unspecified: Secondary | ICD-10-CM

## 2013-09-22 MED ORDER — TRAMADOL HCL 50 MG PO TABS: 50.0000 mg | ORAL_TABLET | Freq: Three times a day (TID) | ORAL | Status: DC | PRN

## 2013-09-22 MED ORDER — CLOBETASOL PROPIONATE 0.05 % EX OINT: 1.0000 "application " | TOPICAL_OINTMENT | Freq: Two times a day (BID) | CUTANEOUS | Status: DC

## 2013-09-22 NOTE — Progress Notes (Signed)
Pre visit review using our clinic review tool, if applicable. No additional management support is needed unless otherwise documented below in the visit note. 

## 2013-09-22 NOTE — Patient Instructions (Signed)
Osteoarthritis Osteoarthritis is a disease that causes soreness and swelling (inflammation) of a joint. It occurs when the cartilage at the affected joint wears down. Cartilage acts as a cushion, covering the ends of bones where they meet to form a joint. Osteoarthritis is the most common form of arthritis. It often occurs in older people. The joints affected most often by this condition include those in the:  Ends of the fingers.  Thumbs.  Neck.  Lower back.  Knees.  Hips. CAUSES  Over time, the cartilage that covers the ends of bones begins to wear away. This causes bone to rub on bone, producing pain and stiffness in the affected joints.  RISK FACTORS Certain factors can increase your chances of having osteoarthritis, including:  Older age.  Excessive body weight.  Overuse of joints. SIGNS AND SYMPTOMS   Pain, swelling, and stiffness in the joint.  Over time, the joint may lose its normal shape.  Small deposits of bone (osteophytes) may grow on the edges of the joint.  Bits of bone or cartilage can break off and float inside the joint space. This may cause more pain and damage. DIAGNOSIS  Your health care provider will do a physical exam and ask about your symptoms. Various tests may be ordered, such as:  X-rays of the affected joint.  An MRI scan.  Blood tests to rule out other types of arthritis.  Joint fluid tests. This involves using a needle to draw fluid from the joint and examining the fluid under a microscope. TREATMENT  Goals of treatment are to control pain and improve joint function. Treatment plans may include:  A prescribed exercise program that allows for rest and joint relief.  A weight control plan.  Pain relief techniques, such as:  Properly applied heat and cold.  Electric pulses delivered to nerve endings under the skin (transcutaneous electrical nerve stimulation, TENS).  Massage.  Certain nutritional supplements.  Medicines to  control pain, such as:  Acetaminophen.  Nonsteroidal anti-inflammatory drugs (NSAIDs), such as naproxen.  Narcotic or central-acting agents, such as tramadol.  Corticosteroids. These can be given orally or as an injection.  Surgery to reposition the bones and relieve pain (osteotomy) or to remove loose pieces of bone and cartilage. Joint replacement may be needed in advanced states of osteoarthritis. HOME CARE INSTRUCTIONS   Only take over-the-counter or prescription medicines as directed by your health care provider. Take all medicines exactly as instructed.  Maintain a healthy weight. Follow your health care provider's instructions for weight control. This may include dietary instructions.  Exercise as directed. Your health care provider can recommend specific types of exercise. These may include:  Strengthening exercises These are done to strengthen the muscles that support joints affected by arthritis. They can be performed with weights or with exercise bands to add resistance.  Aerobic activities These are exercises, such as brisk walking or low-impact aerobics, that get your heart pumping.  Range-of-motion activities These keep your joints limber.  Balance and agility exercises These help you maintain daily living skills.  Rest your affected joints as directed by your health care provider.  Follow up with your health care provider as directed. SEEK MEDICAL CARE IF:   Your skin turns red.  You develop a rash in addition to your joint pain.  You have worsening joint pain. SEEK IMMEDIATE MEDICAL CARE IF:  You have a significant loss of weight or appetite.  You have a fever along with joint or muscle aches.  You have   night sweats. FOR MORE INFORMATION  National Institute of Arthritis and Musculoskeletal and Skin Diseases: www.niams.nih.gov National Institute on Aging: www.nia.nih.gov American College of Rheumatology: www.rheumatology.org Document Released: 05/01/2005  Document Revised: 02/19/2013 Document Reviewed: 01/06/2013 [employer] Patient Information 2014 [employer].  

## 2013-09-22 NOTE — Progress Notes (Signed)
Subjective:    Patient ID: Matthew Villanueva, male    DOB: [birth year], 78 y.o.   MRN: [id number]  Arthritis Presents for follow-up visit. The disease course has been fluctuating. He complains of pain. He reports no stiffness, joint swelling or joint warmth. The symptoms have been worsening. Affected locations include the left shoulder and right shoulder. His pain is at a severity of 2/10. Associated symptoms include pain at night, pain while resting and rash (itchy scaly spot on right upper arm). Pertinent negatives include no diarrhea, dry eyes, dry mouth, dysuria, fatigue, fever, Raynaud's syndrome, uveitis or weight loss. His past medical history is significant for osteoarthritis. His pertinent risk factors include overuse. Past treatments include NSAIDs. The treatment provided moderate relief. Compliance with prior treatments has been variable.      Review of Systems  Constitutional: Negative.  Negative for fever, chills, weight loss, diaphoresis, appetite change and fatigue.  HENT: Negative.   Eyes: Negative.   Respiratory: Negative.  Negative for cough, choking, chest tightness, shortness of breath and stridor.   Cardiovascular: Negative.  Negative for chest pain, palpitations and leg swelling.  Gastrointestinal: Negative.  Negative for nausea, vomiting, abdominal pain, diarrhea, constipation and blood in stool.  Endocrine: Negative.   Genitourinary: Negative.  Negative for dysuria.  Musculoskeletal: Positive for arthralgias and arthritis. Negative for back pain, gait problem, joint swelling, myalgias, neck pain, neck stiffness and stiffness.  Skin: Positive for rash (itchy scaly spot on right upper arm). Negative for color change, pallor and wound.  Allergic/Immunologic: Negative.   Neurological: Negative.  Negative for dizziness.  Hematological: Negative.  Negative for adenopathy. Does not bruise/bleed easily.  Psychiatric/Behavioral: Negative.        Objective:   Physical  Exam  Vitals reviewed. Constitutional: He is oriented to person, place, and time. He appears well-developed and well-nourished. No distress.  HENT:  Head: Normocephalic and atraumatic.  Mouth/Throat: Oropharynx is clear and moist.  Eyes: Conjunctivae are normal. Right eye exhibits no discharge. Left eye exhibits no discharge. No scleral icterus.  Neck: Normal range of motion. Neck supple. No JVD present. No tracheal deviation present. No thyromegaly present.  Cardiovascular: Normal rate, regular rhythm, normal heart sounds and intact distal pulses.  Exam reveals no gallop and no friction rub.   No murmur heard. Pulmonary/Chest: Effort normal and breath sounds normal. No stridor. No respiratory distress. He has no wheezes. He has no rales. He exhibits no tenderness.  Abdominal: Soft. Bowel sounds are normal. He exhibits no distension and no mass. There is no tenderness. There is no rebound and no guarding.  Musculoskeletal: Normal range of motion. He exhibits no edema and no tenderness.       Right shoulder: Normal. He exhibits normal range of motion, no tenderness, no bony tenderness, no swelling, no effusion, no crepitus, no deformity, no laceration, no pain, no spasm, normal pulse and normal strength.       Left shoulder: Normal. He exhibits normal range of motion, no tenderness, no bony tenderness, no swelling, no effusion, no crepitus, no deformity, no laceration, no pain, no spasm, normal pulse and normal strength.  Lymphadenopathy:    He has no cervical adenopathy.  Neurological: He is oriented to person, place, and time.  Skin: Skin is warm, dry and intact. Rash noted. No abrasion, no bruising, no burn, no ecchymosis, no laceration, no lesion, no petechiae and no purpura noted. Rash is macular. Rash is not papular, not maculopapular, not nodular, not pustular, not vesicular and  not urticarial. He is not diaphoretic. No erythema. No pallor.     Psychiatric: He has a normal mood and affect.  His behavior is normal. Judgment and thought content normal.     Lab Results  Component Value Date   WBC 10.3 08/12/2013   HGB 15.9 08/12/2013   HCT 47.1 08/12/2013   PLT 240.0 08/12/2013   GLUCOSE 142* 08/12/2013   CHOL 141 08/12/2013   TRIG 155.0* 08/12/2013   HDL 37.60* 08/12/2013   LDLCALC 72 08/12/2013   ALT 43 08/12/2013   AST 27 08/12/2013   NA 139 08/12/2013   K 4.2 08/12/2013   CL 102 08/12/2013   CREATININE 1.1 08/12/2013   BUN 19 08/12/2013   CO2 29 08/12/2013   TSH 1.75 08/12/2013   PSA 1.88 08/12/2013   HGBA1C 7.5* 08/12/2013   MICROALBUR 4.0* 12/13/2011       Assessment & Plan:

## 2013-09-24 ENCOUNTER — Encounter: Payer: Self-pay | Admitting: Internal Medicine

## 2013-09-24 NOTE — Assessment & Plan Note (Signed)
He will cont taking nsaids Will also control the pain with tramadol

## 2013-09-24 NOTE — Assessment & Plan Note (Signed)
Will treat with a topical steroid 

## 2013-09-29 ENCOUNTER — Other Ambulatory Visit: Payer: Self-pay

## 2013-09-29 DIAGNOSIS — E1165 Type 2 diabetes mellitus with hyperglycemia: Principal | ICD-10-CM

## 2013-09-29 DIAGNOSIS — E1129 Type 2 diabetes mellitus with other diabetic kidney complication: Secondary | ICD-10-CM

## 2013-09-29 DIAGNOSIS — E785 Hyperlipidemia, unspecified: Secondary | ICD-10-CM

## 2013-09-29 MED ORDER — LOSARTAN POTASSIUM-HCTZ 100-25 MG PO TABS: 1.0000 | ORAL_TABLET | Freq: Every day | ORAL | Status: DC

## 2013-09-29 MED ORDER — ROSUVASTATIN CALCIUM 5 MG PO TABS: 5.0000 mg | ORAL_TABLET | Freq: Every day | ORAL | Status: DC

## 2013-10-22 ENCOUNTER — Other Ambulatory Visit: Payer: Self-pay | Admitting: Internal Medicine

## 2013-12-15 ENCOUNTER — Encounter: Payer: Self-pay | Admitting: Internal Medicine

## 2013-12-22 ENCOUNTER — Ambulatory Visit: Payer: Medicare Other | Admitting: Internal Medicine

## 2013-12-24 ENCOUNTER — Other Ambulatory Visit (INDEPENDENT_AMBULATORY_CARE_PROVIDER_SITE_OTHER): Payer: Medicare Other

## 2013-12-24 ENCOUNTER — Ambulatory Visit (INDEPENDENT_AMBULATORY_CARE_PROVIDER_SITE_OTHER): Payer: Medicare Other | Admitting: Internal Medicine

## 2013-12-24 ENCOUNTER — Encounter: Payer: Self-pay | Admitting: Internal Medicine

## 2013-12-24 VITALS — BP 148/84 | HR 83 | Temp 98.3°F | Resp 16 | Ht 70.0 in | Wt 174.8 lb

## 2013-12-24 DIAGNOSIS — E785 Hyperlipidemia, unspecified: Secondary | ICD-10-CM

## 2013-12-24 DIAGNOSIS — R200 Anesthesia of skin: Secondary | ICD-10-CM

## 2013-12-24 DIAGNOSIS — I10 Essential (primary) hypertension: Secondary | ICD-10-CM

## 2013-12-24 DIAGNOSIS — E1165 Type 2 diabetes mellitus with hyperglycemia: Secondary | ICD-10-CM

## 2013-12-24 DIAGNOSIS — N529 Male erectile dysfunction, unspecified: Secondary | ICD-10-CM

## 2013-12-24 DIAGNOSIS — E1129 Type 2 diabetes mellitus with other diabetic kidney complication: Secondary | ICD-10-CM

## 2013-12-24 DIAGNOSIS — E1140 Type 2 diabetes mellitus with diabetic neuropathy, unspecified: Secondary | ICD-10-CM | POA: Insufficient documentation

## 2013-12-24 DIAGNOSIS — R209 Unspecified disturbances of skin sensation: Secondary | ICD-10-CM

## 2013-12-24 DIAGNOSIS — G4733 Obstructive sleep apnea (adult) (pediatric): Secondary | ICD-10-CM

## 2013-12-24 DIAGNOSIS — N528 Other male erectile dysfunction: Secondary | ICD-10-CM

## 2013-12-24 LAB — COMPREHENSIVE METABOLIC PANEL
ALBUMIN: 3.9 g/dL (ref 3.5–5.2)
ALT: 33 U/L (ref 0–53)
AST: 26 U/L (ref 0–37)
Alkaline Phosphatase: 21 U/L — ABNORMAL LOW (ref 39–117)
BUN: 17 mg/dL (ref 6–23)
CHLORIDE: 98 meq/L (ref 96–112)
CO2: 28 meq/L (ref 19–32)
Calcium: 10 mg/dL (ref 8.4–10.5)
Creatinine, Ser: 1.1 mg/dL (ref 0.4–1.5)
GFR: 69.21 mL/min (ref >60.00)
Glucose, Bld: 191 mg/dL — ABNORMAL HIGH (ref 70–99)
POTASSIUM: 4.3 meq/L (ref 3.5–5.1)
Sodium: 136 mEq/L (ref 135–145)
TOTAL PROTEIN: 7.3 g/dL (ref 6.0–8.3)
Total Bilirubin: 0.7 mg/dL (ref 0.2–1.2)

## 2013-12-24 LAB — CBC WITH DIFFERENTIAL/PLATELET
Basophils Absolute: 0.1 10*3/uL (ref 0.0–0.1)
Basophils Relative: 0.6 % (ref 0.0–3.0)
Eosinophils Absolute: 0.1 10*3/uL (ref 0.0–0.7)
Eosinophils Relative: 1.4 % (ref 0.0–5.0)
HCT: 43.9 % (ref 39.0–52.0)
HEMOGLOBIN: 15 g/dL (ref 13.0–17.0)
LYMPHS PCT: 21.6 % (ref 12.0–46.0)
Lymphs Abs: 2.2 10*3/uL (ref 0.7–4.0)
MCHC: 34.2 g/dL (ref 30.0–36.0)
MCV: 86.3 fl (ref 78.0–100.0)
Monocytes Absolute: 0.8 10*3/uL (ref 0.1–1.0)
Monocytes Relative: 7.9 % (ref 3.0–12.0)
NEUTROS ABS: 7.1 10*3/uL (ref 1.4–7.7)
NEUTROS PCT: 68.5 % (ref 43.0–77.0)
Platelets: 241 10*3/uL (ref 150.0–400.0)
RBC: 5.09 Mil/uL (ref 4.22–5.81)
RDW: 14.7 % (ref 11.5–15.5)
WBC: 10.3 10*3/uL (ref 4.0–10.5)

## 2013-12-24 LAB — LIPID PANEL
CHOL/HDL RATIO: 4
Cholesterol: 146 mg/dL (ref 0–200)
HDL: 39.8 mg/dL (ref >39.00)
LDL CALC: 72 mg/dL (ref 0–99)
NONHDL: 106.2
Triglycerides: 169 mg/dL — ABNORMAL HIGH (ref 0.0–149.0)
VLDL: 33.8 mg/dL (ref 0.0–40.0)

## 2013-12-24 LAB — C-REACTIVE PROTEIN: CRP: <0.5 mg/dL (ref 0.5–20.0)

## 2013-12-24 LAB — VITAMIN B12: Vitamin B-12: 404 pg/mL (ref 211–911)

## 2013-12-24 LAB — HEMOGLOBIN A1C: Hgb A1c MFr Bld: 8.7 % — ABNORMAL HIGH (ref 4.6–6.5)

## 2013-12-24 LAB — SEDIMENTATION RATE: Sed Rate: 10 mm/hr (ref 0–22)

## 2013-12-24 LAB — TSH: TSH: 1.93 u[IU]/mL (ref 0.35–4.50)

## 2013-12-24 MED ORDER — PITAVASTATIN CALCIUM 4 MG PO TABS: 1.0000 | ORAL_TABLET | Freq: Every day | ORAL | Status: DC

## 2013-12-24 MED ORDER — VARDENAFIL HCL 20 MG PO TABS: 20.0000 mg | ORAL_TABLET | Freq: Every day | ORAL | Status: DC | PRN

## 2013-12-24 NOTE — Progress Notes (Signed)
Subjective:    Patient ID: Matthew Villanueva, male    DOB: 1935-10-10, 78 y.o.   MRN: [id number]  Diabetes He presents for his follow-up diabetic visit. He has type 2 diabetes mellitus. His disease course has been fluctuating. There are no hypoglycemic associated symptoms. Pertinent negatives for hypoglycemia include no dizziness, headaches, seizures, speech difficulty or tremors. Associated symptoms include foot paresthesias (numbness in both feet). Pertinent negatives for diabetes include no blurred vision, no chest pain, no fatigue, no foot ulcerations, no polydipsia, no polyphagia, no polyuria, no visual change, no weakness and no weight loss. There are no hypoglycemic complications. Symptoms are worsening. Diabetic complications include peripheral neuropathy. Current diabetic treatment includes oral agent (monotherapy). He is compliant with treatment most of the time. His weight is stable. He is following a generally healthy diet. Meal planning includes avoidance of concentrated sweets. He has not had a previous visit with a dietician. He never participates in exercise. His home blood glucose trend is increasing steadily. His breakfast blood glucose range is generally >200 mg/dl. His lunch blood glucose range is generally >200 mg/dl. His dinner blood glucose range is generally >200 mg/dl. His highest blood glucose is >200 mg/dl. His overall blood glucose range is >200 mg/dl. An ACE inhibitor/angiotensin II receptor blocker is being taken. He does not see a podiatrist.Eye exam is current.      Review of Systems  Constitutional: Negative.  Negative for fever, chills, weight loss, diaphoresis, appetite change and fatigue.  HENT: Negative.   Eyes: Negative.  Negative for blurred vision.  Respiratory: Positive for apnea. Negative for cough, choking, chest tightness, shortness of breath and wheezing.   Cardiovascular: Negative.  Negative for chest pain, palpitations and leg swelling.    Gastrointestinal: Negative.  Negative for nausea, vomiting, abdominal pain, diarrhea, constipation and blood in stool.  Endocrine: Negative.  Negative for polydipsia, polyphagia and polyuria.  Genitourinary: Negative.   Musculoskeletal: Negative.  Negative for arthralgias, back pain, joint swelling and myalgias.  Skin: Negative.  Negative for rash.  Allergic/Immunologic: Negative.   Neurological: Positive for numbness (both feet). Negative for dizziness, tremors, seizures, syncope, facial asymmetry, speech difficulty, weakness, light-headedness and headaches.  Hematological: Negative.  Negative for adenopathy. Does not bruise/bleed easily.  Psychiatric/Behavioral: Negative.        Objective:   Physical Exam  Vitals reviewed. Constitutional: He is oriented to person, place, and time. He appears well-developed and well-nourished. No distress.  HENT:  Head: Normocephalic and atraumatic.  Mouth/Throat: Oropharynx is clear and moist. No oropharyngeal exudate.  Eyes: Conjunctivae are normal. Right eye exhibits no discharge. Left eye exhibits no discharge. No scleral icterus.  Neck: Normal range of motion. Neck supple. No JVD present. No tracheal deviation present. No thyromegaly present.  Cardiovascular: Normal rate, regular rhythm, normal heart sounds and intact distal pulses.  Exam reveals no gallop and no friction rub.   No murmur heard. Pulmonary/Chest: Effort normal and breath sounds normal. No stridor. No respiratory distress. He has no wheezes. He has no rales. He exhibits no tenderness.  Abdominal: Soft. Bowel sounds are normal. He exhibits no distension and no mass. There is no tenderness. There is no rebound and no guarding.  Musculoskeletal: Normal range of motion. He exhibits no edema and no tenderness.  Lymphadenopathy:    He has no cervical adenopathy.  Neurological: He is alert and oriented to person, place, and time. He has normal strength. He displays no atrophy and no  tremor. No cranial nerve deficit or sensory  deficit. He exhibits normal muscle tone. He displays a negative Romberg sign. He displays no seizure activity. Coordination and gait normal.  Reflex Scores:      Tricep reflexes are 0 on the right side and 0 on the left side.      Bicep reflexes are 0 on the right side and 0 on the left side.      Brachioradialis reflexes are 0 on the right side and 0 on the left side.      Patellar reflexes are 0 on the right side and 0 on the left side.      Achilles reflexes are 0 on the right side and 0 on the left side. Skin: Skin is warm and dry. No rash noted. He is not diaphoretic. No erythema. No pallor.     Lab Results  Component Value Date   WBC 10.3 08/12/2013   HGB 15.9 08/12/2013   HCT 47.1 08/12/2013   PLT 240.0 08/12/2013   GLUCOSE 142* 08/12/2013   CHOL 141 08/12/2013   TRIG 155.0* 08/12/2013   HDL 37.60* 08/12/2013   LDLCALC 72 08/12/2013   ALT 43 08/12/2013   AST 27 08/12/2013   NA 139 08/12/2013   K 4.2 08/12/2013   CL 102 08/12/2013   CREATININE 1.1 08/12/2013   BUN 19 08/12/2013   CO2 29 08/12/2013   TSH 1.75 08/12/2013   PSA 1.88 08/12/2013   HGBA1C 7.5* 08/12/2013   MICROALBUR 4.0* 12/13/2011       Assessment & Plan:

## 2013-12-24 NOTE — Progress Notes (Signed)
Pre visit review using our clinic review tool, if applicable. No additional management support is needed unless otherwise documented below in the visit note. 

## 2013-12-24 NOTE — Patient Instructions (Signed)

## 2013-12-25 ENCOUNTER — Encounter: Payer: Self-pay | Admitting: Internal Medicine

## 2013-12-25 LAB — FOLATE

## 2013-12-25 MED ORDER — CANAGLIFLOZIN-METFORMIN HCL 150-1000 MG PO TABS: 1.0000 | ORAL_TABLET | Freq: Two times a day (BID) | ORAL | Status: DC

## 2013-12-25 NOTE — Assessment & Plan Note (Signed)
His blood sugars are not well controlled I have asked him to change to invokamet for better blood sugar control

## 2013-12-25 NOTE — Assessment & Plan Note (Signed)
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

## 2013-12-25 NOTE — Assessment & Plan Note (Signed)
He will try levitra for this

## 2013-12-25 NOTE — Assessment & Plan Note (Signed)
He has achieved his LDL goal 

## 2013-12-26 LAB — SPEP & IFE WITH QIG
ALPHA-2-GLOBULIN: 12.4 % — AB (ref 7.1–11.8)
Albumin ELP: 54.8 % — ABNORMAL LOW (ref 55.8–66.1)
Alpha-1-Globulin: 4 % (ref 2.9–4.9)
Beta 2: 5.8 % (ref 3.2–6.5)
Beta Globulin: 6.4 % (ref 4.7–7.2)
GAMMA GLOBULIN: 16.6 % (ref 11.1–18.8)
IgA: 251 mg/dL (ref 68–379)
IgG (Immunoglobin G), Serum: 1130 mg/dL (ref 650–1600)
IgM, Serum: 86 mg/dL (ref 41–251)
Total Protein, Serum Electrophoresis: 7.2 g/dL (ref 6.0–8.3)

## 2014-01-08 ENCOUNTER — Other Ambulatory Visit: Payer: Self-pay | Admitting: Internal Medicine

## 2014-01-08 DIAGNOSIS — R200 Anesthesia of skin: Secondary | ICD-10-CM

## 2014-01-21 ENCOUNTER — Other Ambulatory Visit: Payer: Self-pay

## 2014-01-21 DIAGNOSIS — E1165 Type 2 diabetes mellitus with hyperglycemia: Principal | ICD-10-CM

## 2014-01-21 DIAGNOSIS — E785 Hyperlipidemia, unspecified: Secondary | ICD-10-CM

## 2014-01-21 DIAGNOSIS — E1129 Type 2 diabetes mellitus with other diabetic kidney complication: Secondary | ICD-10-CM

## 2014-01-21 MED ORDER — PITAVASTATIN CALCIUM 4 MG PO TABS: 1.0000 | ORAL_TABLET | Freq: Every day | ORAL | Status: DC

## 2014-01-21 MED ORDER — CANAGLIFLOZIN-METFORMIN HCL 150-1000 MG PO TABS: 1.0000 | ORAL_TABLET | Freq: Two times a day (BID) | ORAL | Status: DC

## 2014-01-28 ENCOUNTER — Institutional Professional Consult (permissible substitution): Payer: Medicare Other | Admitting: Pulmonary Disease

## 2014-02-25 ENCOUNTER — Ambulatory Visit (INDEPENDENT_AMBULATORY_CARE_PROVIDER_SITE_OTHER): Payer: Medicare Other | Admitting: Internal Medicine

## 2014-02-25 ENCOUNTER — Encounter: Payer: Self-pay | Admitting: Internal Medicine

## 2014-02-25 VITALS — BP 110/66 | HR 86 | Temp 97.9°F | Resp 20 | Ht 71.0 in | Wt 265.6 lb

## 2014-02-25 DIAGNOSIS — R059 Cough, unspecified: Secondary | ICD-10-CM | POA: Insufficient documentation

## 2014-02-25 DIAGNOSIS — R05 Cough: Secondary | ICD-10-CM

## 2014-02-25 MED ORDER — BENZONATATE 100 MG PO CAPS
100.0000 mg | ORAL_CAPSULE | Freq: Three times a day (TID) | ORAL | Status: DC | PRN
Start: 2014-02-25 — End: 2014-05-25

## 2014-02-25 NOTE — Patient Instructions (Signed)
We will give you some tessalon perles to help with your cough. They work in the brain to help prevent you from coughing. You do not need antibiotics at this time. You can also use the over the counter cough syrup or cough drops if you feel they are helpful.  Upper Respiratory Infection, Adult An upper respiratory infection (URI) is also known as the common cold. It is often caused by a type of germ (virus). Colds are easily spread (contagious). You can pass it to others by kissing, coughing, sneezing, or drinking out of the same glass. Usually, you get better in 1 or 2 weeks.  HOME CARE   Only take medicine as told by your doctor.  Use a warm mist humidifier or breathe in steam from a hot shower.  Drink enough water and fluids to keep your pee (urine) clear or pale yellow.  Get plenty of rest.  Return to work when your temperature is back to normal or as told by your doctor. You may use a face mask and wash your hands to stop your cold from spreading. GET HELP RIGHT AWAY IF:   After the first few days, you feel you are getting worse.  You have questions about your medicine.  You have chills, shortness of breath, or brown or red spit (mucus).  You have yellow or brown snot (nasal discharge) or pain in the face, especially when you bend forward.  You have a fever, puffy (swollen) neck, pain when you swallow, or white spots in the back of your throat.  You have a bad headache, ear pain, sinus pain, or chest pain.  You have a high-pitched whistling sound when you breathe in and out (wheezing).  You have a lasting cough or cough up blood.  You have sore muscles or a stiff neck. MAKE SURE YOU:   Understand these instructions.  Will watch your condition.  Will get help right away if you are not doing well or get worse. Document Released: 10/18/2007 Document Revised: 07/24/2011 Document Reviewed: 08/06/2013 [facility] Patient Information 2015 [place], [place]. This information is not  intended to replace advice given to you by your health care provider. Make sure you discuss any questions you have with your health care provider.

## 2014-02-25 NOTE — Assessment & Plan Note (Signed)
Likely mild URI and improved drainage, informed him that cough may linger several weeks. Tessalon perles prescribed for his comfort. No antibiotic indicated.

## 2014-02-25 NOTE — Progress Notes (Signed)
Pre visit review using our clinic review tool, if applicable. No additional management support is needed unless otherwise documented below in the visit note. 

## 2014-02-25 NOTE — Progress Notes (Signed)
   Subjective:    Patient ID: Matthew Villanueva, male    DOB: 1935-06-27, 78 y.o.   MRN: [id number]  HPI The patient is a 78 YO man who is coming in for an acute visit for cough for 3 days. It started off with nasal congestion which has since resolved. He denies fevers, chills, SOB, chest pain. He denies ear fullness/drainage/pain. He denies headache or sinus pressure. He is making occasional sputum yellow to clear.   Review of Systems  Constitutional: Negative for fever, chills, activity change, appetite change and fatigue.  HENT: Negative for congestion, ear discharge, ear pain, postnasal drip, rhinorrhea, sinus pressure, sneezing and sore throat.   Eyes: Negative.   Respiratory: Positive for cough. Negative for chest tightness, shortness of breath and wheezing.   Cardiovascular: Negative for chest pain, palpitations and leg swelling.  Gastrointestinal: Negative for abdominal pain, diarrhea, constipation and abdominal distention.  Neurological: Negative for weakness, light-headedness and headaches.      Objective:   Physical Exam  Constitutional: He appears well-developed and well-nourished. No distress.  HENT:  Head: Normocephalic and atraumatic.  Right Ear: External ear normal.  Left Ear: External ear normal.  Mouth/Throat: Oropharynx is clear and moist.  Nose with some clear crusting in the hairs, no erythema.   Eyes: EOM are normal.  Neck: Normal range of motion.  Cardiovascular: Normal rate and regular rhythm.   Pulmonary/Chest: Effort normal and breath sounds normal. No respiratory distress. He has no wheezes. He has no rales.  Abdominal: Soft.   Filed Vitals:   02/25/14 0938  BP: 110/66  Pulse: 86  Temp: 97.9 F (36.6 C)  TempSrc: Oral  Resp: 20  Height: 5\' 11"  (1.803 m)  Weight: 265 lb 9.6 oz (120.475 kg)  SpO2: 96%      Assessment & Plan:

## 2014-03-03 ENCOUNTER — Telehealth: Payer: Self-pay

## 2014-03-03 MED ORDER — SILDENAFIL CITRATE 20 MG PO TABS: 20.0000 mg | ORAL_TABLET | Freq: Every day | ORAL | Status: DC

## 2014-03-03 NOTE — Telephone Encounter (Signed)
Received fax stating that patient is unable to afford levitra ($40 each) and would like to switch to revatio/sildenafil which is more affordable. Thanks

## 2014-03-03 NOTE — Telephone Encounter (Signed)
done

## 2014-03-19 ENCOUNTER — Ambulatory Visit (INDEPENDENT_AMBULATORY_CARE_PROVIDER_SITE_OTHER): Payer: Medicare Other | Admitting: Neurology

## 2014-03-19 ENCOUNTER — Encounter: Payer: Self-pay | Admitting: Internal Medicine

## 2014-03-19 DIAGNOSIS — G629 Polyneuropathy, unspecified: Secondary | ICD-10-CM

## 2014-03-19 DIAGNOSIS — R200 Anesthesia of skin: Secondary | ICD-10-CM

## 2014-03-19 DIAGNOSIS — R208 Other disturbances of skin sensation: Secondary | ICD-10-CM

## 2014-03-19 NOTE — Procedures (Signed)
[facility]  [address]  [place], [place] [address] Tel: [phone] Fax:  [phone] Test Date:  03/19/2014  Patient: Matthew Villanueva DOB: Sep 08, 1935 Physician: Narda Amber  Sex: Male Height: 5\' 10"  Ref Phys: Scarlette Calico   ID#: [id number] Temp:  34.0C Technician:    Patient Complaints: Patient is a 78 year old male here for evaluation of bilateral feet paresthesias.   NCV & EMG Findings: Extensive electrodiagnostic testing of the right lower extremity and additional studies of the left reveals:  1. Bilateral sural sensory responses are reduced in amplitude. Bilateral superficial peroneal sensory responses are within normal limits. 2. Bilateral tibial motor responses are reduced with borderline slowing of conduction velocity. The left peroneal motor response recording at the extensor digitorum brevis is reduced in amplitude however the peroneal motor response recording at the tibialis anterior is within normal limits. The right peroneal motor responses within normal limits. 3. Chronic motor axon loss changes are seen affecting the tibialis anterior, medial gastrocnemius, and flexor digitorum longus muscles bilaterally. There is no evidence of active denervation.  Impression: 1. 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 Overall, these findings are mild in degree electrically. 2. There is no evidence of a superimposed lumbosacral radiculopathy affecting the lower extremities.    ___________________________ Narda Amber    Nerve Conduction Studies Anti Sensory Summary Table   Stim Site NR Peak (ms) Norm Peak (ms) P-T Amp (V) Norm P-T Amp  Left Sup Peroneal Anti Sensory (Ant Lat Mall)  12 cm    3.6 <4.6 4.0 >3  Right Sup Peroneal Anti Sensory (Ant Lat Mall)  12 cm    3.4 <4.6 4.2 >3  Left Sural Anti Sensory (Lat Mall)  Calf    4.2 <4.6 2.7 >3  Right Sural Anti Sensory  (Lat Mall)  Calf    4.5 <4.6 2.3 >3   Motor Summary Table   Stim Site NR Onset (ms) Norm Onset (ms) O-P Amp (mV) Norm O-P Amp Site1 Site2 Delta-0 (ms) Dist (cm) Vel (m/s) Norm Vel (m/s)  Left Peroneal Motor (Ext Dig Brev)  Ankle    5.0 <6.0 2.3 >2.5 B Fib Ankle 8.0 32.0 40 >40  B Fib    13.0  1.8  Poplt B Fib 1.9 10.0 53 >40  Poplt    14.9  1.6         Right Peroneal Motor (Ext Dig Brev)  Ankle    3.4 <6.0 3.4 >2.5 B Fib Ankle 8.8 35.0 40 >40  B Fib    12.2  2.3  Poplt B Fib 2.1 9.0 43 >40  Poplt    14.3  2.1         Left Peroneal TA Motor (Tib Ant)  Fib Head    2.2 <4.5 4.8 >3 Poplit Fib Head 2.1 10.0 48 >40  Poplit    4.3  4.7         Right Peroneal TA Motor (Tib Ant)  Fib Head    2.7 <4.5 5.7 >3 Poplit Fib Head 2.1 9.0 43 >40  Poplit    4.8  5.6         Left Tibial Motor (Abd Hall Brev)  Ankle    4.5 <6.0 3.3 >4 Knee Ankle 11.7 43.0 37 >40  Knee    16.2  2.0         Right Tibial Motor (Abd Hall Brev)  Ankle  3.8 <6.0 3.9 >4 Knee Ankle 11.7 42.0 36 >40  Knee    15.5  1.9          EMG   Side Muscle Ins Act Fibs Psw Fasc Number Recrt Dur Dur. Amp Amp. Poly Poly. Comment  Left AntTibialis Nml Nml Nml Nml 1- Mod-R Some 1+ Few 1+ Nml Nml N/A  Left Gastroc Nml Nml Nml Nml 1- Mod-V Few 1+ Nml Nml Nml Nml N/A  Left Flex Dig Long Nml Nml Nml Nml 1- Mod-R Few 1+ Few 1+ Nml Nml N/A  Left RectFemoris Nml Nml Nml Nml Nml Nml Nml Nml Nml Nml Nml Nml N/A  Left GluteusMed Nml Nml Nml Nml Nml Nml Nml Nml Nml Nml Nml Nml N/A  Left BicepsFemS Nml Nml Nml Nml Nml Nml Nml Nml Nml Nml Nml Nml N/A  Right BicepsFemS Nml Nml Nml Nml Nml Nml Nml Nml Nml Nml Nml Nml N/A  Right AntTibialis Nml Nml Nml Nml 1- Mod-R Few 1+ Few 1+ Nml Nml N/A  Right Gastroc Nml Nml Nml Nml 1- Mod-R Few 1+ Nml Nml Nml Nml N/A  Right Flex Dig Long Nml Nml Nml Nml 1- Mod-R Few 1+ Few 1+ Nml Nml N/A  Right RectFemoris Nml Nml Nml Nml Nml Nml Nml Nml Nml Nml Nml Nml N/A  Right GluteusMed Nml Nml Nml Nml Nml Nml Nml Nml Nml  Nml Nml Nml N/A      Waveforms:

## 2014-03-31 LAB — HM DIABETES EYE EXAM

## 2014-05-05 ENCOUNTER — Telehealth: Payer: Self-pay | Admitting: Internal Medicine

## 2014-05-05 NOTE — Telephone Encounter (Signed)
Pt inquiring if he should schedule a colonoscopy. He also wants to know if he should have his hemoglobin checked.

## 2014-05-05 NOTE — Telephone Encounter (Signed)
Left message informing patient that he can discuss those things during his next routine follow up visit.

## 2014-05-18 ENCOUNTER — Other Ambulatory Visit: Payer: Self-pay | Admitting: Internal Medicine

## 2014-05-25 ENCOUNTER — Encounter: Payer: Self-pay | Admitting: Internal Medicine

## 2014-05-25 ENCOUNTER — Ambulatory Visit (INDEPENDENT_AMBULATORY_CARE_PROVIDER_SITE_OTHER): Payer: Medicare Other | Admitting: Internal Medicine

## 2014-05-25 ENCOUNTER — Ambulatory Visit (INDEPENDENT_AMBULATORY_CARE_PROVIDER_SITE_OTHER)
Admission: RE | Admit: 2014-05-25 | Discharge: 2014-05-25 | Disposition: A | Discharge status: Home / Self Care | Payer: Medicare Other | Source: Ambulatory Visit | Attending: Internal Medicine | Admitting: Internal Medicine

## 2014-05-25 VITALS — BP 122/68 | HR 78 | Temp 97.8°F | Resp 16 | Wt 256.0 lb

## 2014-05-25 DIAGNOSIS — R05 Cough: Secondary | ICD-10-CM

## 2014-05-25 DIAGNOSIS — R059 Cough, unspecified: Secondary | ICD-10-CM

## 2014-05-25 DIAGNOSIS — E785 Hyperlipidemia, unspecified: Secondary | ICD-10-CM | POA: Diagnosis not present

## 2014-05-25 DIAGNOSIS — J202 Acute bronchitis due to streptococcus: Secondary | ICD-10-CM | POA: Diagnosis not present

## 2014-05-25 MED ORDER — AZITHROMYCIN 500 MG PO TABS: 500.0000 mg | ORAL_TABLET | Freq: Every day | ORAL | Status: DC

## 2014-05-25 MED ORDER — HYDROCODONE-HOMATROPINE 5-1.5 MG/5ML PO SYRP: 5.0000 mL | ORAL_SOLUTION | Freq: Three times a day (TID) | ORAL | Status: DC | PRN

## 2014-05-25 MED ORDER — PRAVASTATIN SODIUM 20 MG PO TABS: 20.0000 mg | ORAL_TABLET | Freq: Every day | ORAL | Status: DC

## 2014-05-25 NOTE — Assessment & Plan Note (Signed)
He has completed augmentin and is not much better Will try zithromax, will also start hycodan for the cough

## 2014-05-25 NOTE — Assessment & Plan Note (Signed)
He has not tolerated crestor or livalo Will try pravachol

## 2014-05-25 NOTE — Progress Notes (Signed)
Pre visit review using our clinic review tool, if applicable. No additional management support is needed unless otherwise documented below in the visit note. 

## 2014-05-25 NOTE — Patient Instructions (Signed)

## 2014-05-25 NOTE — Progress Notes (Signed)
   Subjective:    Patient ID: Matthew Villanueva, male    DOB: 1935/11/29, 79 y.o.   MRN: [id number]  Cough This is a recurrent problem. The current episode started 1 to 4 weeks ago. The problem has been unchanged. The problem occurs every few hours. The cough is productive of purulent sputum. Associated symptoms include chills and a sore throat. Pertinent negatives include no chest pain, ear congestion, ear pain, fever, headaches, heartburn, hemoptysis, myalgias, nasal congestion, postnasal drip, rash, rhinorrhea, shortness of breath, sweats, weight loss or wheezing. Treatments tried: augmentin. The treatment provided no relief. There is no history of asthma, bronchiectasis, bronchitis, COPD, emphysema, environmental allergies or pneumonia.      Review of Systems  Constitutional: Positive for chills. Negative for fever, weight loss, diaphoresis, activity change, appetite change and fatigue.  HENT: Positive for sore throat. Negative for congestion, ear pain, postnasal drip, rhinorrhea, tinnitus and trouble swallowing.   Eyes: Negative.   Respiratory: Positive for cough. Negative for apnea, hemoptysis, choking, chest tightness, shortness of breath, wheezing and stridor.   Cardiovascular: Negative.  Negative for chest pain, palpitations and leg swelling.  Gastrointestinal: Negative.  Negative for heartburn, nausea, vomiting, abdominal pain, diarrhea, constipation and blood in stool.  Endocrine: Negative.   Genitourinary: Negative.   Musculoskeletal: Negative.  Negative for myalgias.  Skin: Negative.  Negative for rash.  Allergic/Immunologic: Negative.  Negative for environmental allergies.  Neurological: Negative.  Negative for dizziness and headaches.  Hematological: Negative.  Negative for adenopathy. Does not bruise/bleed easily.  Psychiatric/Behavioral: Negative.        Objective:   Physical Exam  Constitutional: He is oriented to person, place, and time. He appears well-developed  and well-nourished.  Non-toxic appearance. He does not have a sickly appearance. He does not appear ill. No distress.  HENT:  Head: Normocephalic and atraumatic.  Mouth/Throat: Oropharynx is clear and moist. No oropharyngeal exudate.  Eyes: Conjunctivae are normal. Right eye exhibits no discharge. Left eye exhibits no discharge. No scleral icterus.  Neck: Normal range of motion. Neck supple. No JVD present. No tracheal deviation present. No thyromegaly present.  Cardiovascular: Normal rate, regular rhythm, normal heart sounds and intact distal pulses.  Exam reveals no gallop and no friction rub.   No murmur heard. Pulmonary/Chest: Effort normal and breath sounds normal. No stridor. No respiratory distress. He has no wheezes. He has no rales. He exhibits no tenderness.  Abdominal: Soft. Bowel sounds are normal. He exhibits no distension and no mass. There is no tenderness. There is no rebound and no guarding.  Musculoskeletal: Normal range of motion. He exhibits no edema or tenderness.  Lymphadenopathy:    He has no cervical adenopathy.  Neurological: He is oriented to person, place, and time.  Skin: Skin is warm and dry. No rash noted. He is not diaphoretic. No erythema. No pallor.  Vitals reviewed.         Assessment & Plan:

## 2014-05-25 NOTE — Assessment & Plan Note (Signed)
I will check his CXR to see if there is PNA, mass , edema

## 2014-05-26 ENCOUNTER — Encounter: Payer: Self-pay | Admitting: Gastroenterology

## 2014-05-29 ENCOUNTER — Encounter: Payer: Self-pay | Admitting: Internal Medicine

## 2014-06-10 ENCOUNTER — Ambulatory Visit (INDEPENDENT_AMBULATORY_CARE_PROVIDER_SITE_OTHER): Payer: Medicare Other | Admitting: Internal Medicine

## 2014-06-10 ENCOUNTER — Other Ambulatory Visit (INDEPENDENT_AMBULATORY_CARE_PROVIDER_SITE_OTHER): Payer: Medicare Other

## 2014-06-10 ENCOUNTER — Encounter: Payer: Self-pay | Admitting: Internal Medicine

## 2014-06-10 VITALS — BP 116/70 | HR 67 | Temp 97.8°F | Resp 16 | Ht 71.0 in | Wt 257.0 lb

## 2014-06-10 DIAGNOSIS — Z789 Other specified health status: Secondary | ICD-10-CM

## 2014-06-10 DIAGNOSIS — M1000 Idiopathic gout, unspecified site: Secondary | ICD-10-CM

## 2014-06-10 DIAGNOSIS — Z889 Allergy status to unspecified drugs, medicaments and biological substances status: Secondary | ICD-10-CM

## 2014-06-10 DIAGNOSIS — I10 Essential (primary) hypertension: Secondary | ICD-10-CM | POA: Diagnosis not present

## 2014-06-10 DIAGNOSIS — E785 Hyperlipidemia, unspecified: Secondary | ICD-10-CM | POA: Diagnosis not present

## 2014-06-10 DIAGNOSIS — E1121 Type 2 diabetes mellitus with diabetic nephropathy: Secondary | ICD-10-CM | POA: Diagnosis not present

## 2014-06-10 LAB — BASIC METABOLIC PANEL
BUN: 20 mg/dL (ref 6–23)
CO2: 27 mEq/L (ref 19–32)
CREATININE: 1.03 mg/dL (ref 0.40–1.50)
Calcium: 9.8 mg/dL (ref 8.4–10.5)
Chloride: 100 mEq/L (ref 96–112)
GFR: 74.57 mL/min (ref >60.00)
GLUCOSE: 155 mg/dL — AB (ref 70–99)
Potassium: 3.8 mEq/L (ref 3.5–5.1)
SODIUM: 135 meq/L (ref 135–145)

## 2014-06-10 LAB — CBC WITH DIFFERENTIAL/PLATELET
Basophils Absolute: 0.1 10*3/uL (ref 0.0–0.1)
Basophils Relative: 0.5 % (ref 0.0–3.0)
EOS ABS: 0.3 10*3/uL (ref 0.0–0.7)
EOS PCT: 2.3 % (ref 0.0–5.0)
HCT: 46.5 % (ref 39.0–52.0)
HEMOGLOBIN: 16.1 g/dL (ref 13.0–17.0)
LYMPHS PCT: 17.7 % (ref 12.0–46.0)
Lymphs Abs: 2.1 10*3/uL (ref 0.7–4.0)
MCHC: 34.5 g/dL (ref 30.0–36.0)
MCV: 83.6 fl (ref 78.0–100.0)
MONOS PCT: 8.7 % (ref 3.0–12.0)
Monocytes Absolute: 1 10*3/uL (ref 0.1–1.0)
Neutro Abs: 8.3 10*3/uL — ABNORMAL HIGH (ref 1.4–7.7)
Neutrophils Relative %: 70.8 % (ref 43.0–77.0)
Platelets: 258 10*3/uL (ref 150.0–400.0)
RBC: 5.56 Mil/uL (ref 4.22–5.81)
RDW: 15.4 % (ref 11.5–15.5)
WBC: 11.8 10*3/uL — AB (ref 4.0–10.5)

## 2014-06-10 LAB — URINALYSIS, ROUTINE W REFLEX MICROSCOPIC
Bilirubin Urine: NEGATIVE
HGB URINE DIPSTICK: NEGATIVE
Ketones, ur: NEGATIVE
LEUKOCYTES UA: NEGATIVE
Nitrite: NEGATIVE
RBC / HPF: NONE SEEN (ref 0–?)
SPECIFIC GRAVITY, URINE: 1.015 (ref 1.000–1.030)
Total Protein, Urine: NEGATIVE
UROBILINOGEN UA: 0.2 (ref 0.0–1.0)
Urine Glucose: >=1000 — AB
WBC, UA: NONE SEEN (ref 0–?)
pH: 5.5 (ref 5.0–8.0)

## 2014-06-10 LAB — MICROALBUMIN / CREATININE URINE RATIO
Creatinine,U: 68 mg/dL
Microalb Creat Ratio: 5 mg/g (ref 0.0–30.0)
Microalb, Ur: 3.4 mg/dL — ABNORMAL HIGH (ref 0.0–1.9)

## 2014-06-10 LAB — HEMOGLOBIN A1C: Hgb A1c MFr Bld: 7.2 % — ABNORMAL HIGH (ref 4.6–6.5)

## 2014-06-10 NOTE — Progress Notes (Signed)
Subjective:    Patient ID: Matthew Villanueva, male    DOB: 06-Jun-1935, 79 y.o.   MRN: [id number]  Diabetes He presents for his follow-up diabetic visit. He has type 2 diabetes mellitus. His disease course has been improving. There are no hypoglycemic associated symptoms. Pertinent negatives for diabetes include no blurred vision, no chest pain, no fatigue, no foot paresthesias, no foot ulcerations, no polydipsia, no polyphagia, no polyuria, no visual change, no weakness and no weight loss. There are no hypoglycemic complications. Symptoms are improving. Diabetic complications include peripheral neuropathy. Current diabetic treatment includes oral agent (dual therapy). He is compliant with treatment all of the time. He is following a generally healthy diet. Meal planning includes avoidance of concentrated sweets. He has not had a previous visit with a dietitian. He participates in exercise intermittently. His home blood glucose trend is decreasing steadily. An ACE inhibitor/angiotensin II receptor blocker is being taken. He does not see a podiatrist.Eye exam is current.      Review of Systems  Constitutional: Negative.  Negative for fever, chills, weight loss, diaphoresis, appetite change and fatigue.  HENT: Negative.   Eyes: Negative.  Negative for blurred vision.  Respiratory: Negative.  Negative for cough, choking, chest tightness, shortness of breath and stridor.   Cardiovascular: Negative.  Negative for chest pain, palpitations and leg swelling.  Gastrointestinal: Negative.  Negative for nausea, vomiting, abdominal pain, diarrhea, constipation and blood in stool.  Endocrine: Negative.  Negative for polydipsia, polyphagia and polyuria.  Genitourinary: Negative.   Musculoskeletal: Positive for myalgias. Negative for back pain, arthralgias and neck pain.       He was experiencing some myalgias so he stopped taking pravachol and the muscle aches resolved  Skin: Negative.     Allergic/Immunologic: Negative.   Neurological: Negative.  Negative for weakness.  Hematological: Negative.  Negative for adenopathy. Does not bruise/bleed easily.  Psychiatric/Behavioral: Negative.        Objective:   Physical Exam  Constitutional: He is oriented to person, place, and time. He appears well-developed and well-nourished. No distress.  HENT:  Head: Normocephalic and atraumatic.  Mouth/Throat: Oropharynx is clear and moist. No oropharyngeal exudate.  Eyes: Conjunctivae are normal. Right eye exhibits no discharge. Left eye exhibits no discharge. No scleral icterus.  Neck: Normal range of motion. Neck supple. No JVD present. No tracheal deviation present. No thyromegaly present.  Cardiovascular: Normal rate, regular rhythm, normal heart sounds and intact distal pulses.  Exam reveals no gallop and no friction rub.   No murmur heard. Pulmonary/Chest: Effort normal and breath sounds normal. No stridor. No respiratory distress. He has no wheezes. He has no rales. He exhibits no tenderness.  Abdominal: Soft. Bowel sounds are normal. He exhibits no distension and no mass. There is no tenderness. There is no rebound and no guarding.  Musculoskeletal: Normal range of motion. He exhibits no edema or tenderness.  Lymphadenopathy:    He has no cervical adenopathy.  Neurological: He is oriented to person, place, and time.  Skin: Skin is warm and dry. No rash noted. He is not diaphoretic. No erythema. No pallor.  Vitals reviewed.    Lab Results  Component Value Date   WBC 10.3 12/24/2013   HGB 15.0 12/24/2013   HCT 43.9 12/24/2013   PLT 241.0 12/24/2013   GLUCOSE 191* 12/24/2013   CHOL 146 12/24/2013   TRIG 169.0* 12/24/2013   HDL 39.80 12/24/2013   LDLCALC 72 12/24/2013   ALT 33 12/24/2013   AST 26  12/24/2013   NA 136 12/24/2013   K 4.3 12/24/2013   CL 98 12/24/2013   CREATININE 1.1 12/24/2013   BUN 17 12/24/2013   CO2 28 12/24/2013   TSH 1.93 12/24/2013   PSA 1.88  08/12/2013   HGBA1C 8.7* 12/24/2013   MICROALBUR 4.0* 12/13/2011       Assessment & Plan:

## 2014-06-10 NOTE — Progress Notes (Signed)
Pre visit review using our clinic review tool, if applicable. No additional management support is needed unless otherwise documented below in the visit note. 

## 2014-06-10 NOTE — Patient Instructions (Signed)

## 2014-06-11 ENCOUNTER — Telehealth: Payer: Self-pay | Admitting: Internal Medicine

## 2014-06-11 ENCOUNTER — Encounter: Payer: Self-pay | Admitting: Internal Medicine

## 2014-06-11 MED ORDER — EZETIMIBE 10 MG PO TABS: 10.0000 mg | ORAL_TABLET | Freq: Every day | ORAL | Status: DC

## 2014-06-11 NOTE — Assessment & Plan Note (Signed)
He has tired at least 3 statins and they all caused muscle aches Will stop statins for now and will not retry at this request Will start zetia instead

## 2014-06-11 NOTE — Telephone Encounter (Signed)
emmi emailed °

## 2014-06-11 NOTE — Assessment & Plan Note (Signed)
He is doing well on his current meds, his A1C has come down quite a bit He will cont to work on his lifestyle modifications

## 2014-06-11 NOTE — Assessment & Plan Note (Signed)
His BP is well controlled Lytes and renal function are stable 

## 2014-06-18 ENCOUNTER — Other Ambulatory Visit: Payer: Self-pay | Admitting: *Deleted

## 2014-06-18 MED ORDER — LOSARTAN POTASSIUM-HCTZ 100-25 MG PO TABS: 1.0000 | ORAL_TABLET | Freq: Every day | ORAL | Status: DC

## 2014-06-18 NOTE — Telephone Encounter (Signed)
Left msg on triage needing refill on his losartan. Called pt inform refill sent to [facility]...Matthew Villanueva

## 2014-07-09 ENCOUNTER — Other Ambulatory Visit: Payer: Self-pay | Admitting: Internal Medicine

## 2014-12-31 ENCOUNTER — Encounter: Payer: Self-pay | Admitting: Gastroenterology

## 2015-01-02 ENCOUNTER — Other Ambulatory Visit: Payer: Self-pay | Admitting: Internal Medicine

## 2015-01-05 ENCOUNTER — Other Ambulatory Visit: Payer: Self-pay

## 2015-01-05 MED ORDER — CANAGLIFLOZIN-METFORMIN HCL 150-1000 MG PO TABS: ORAL_TABLET | ORAL | Status: DC

## 2015-01-05 NOTE — Telephone Encounter (Signed)
Last OV 06/10/2014. Ok to rf? Last fill stated authorization was needed for next fill.

## 2015-01-11 ENCOUNTER — Encounter: Payer: Self-pay | Admitting: Gastroenterology

## 2015-03-10 ENCOUNTER — Other Ambulatory Visit: Payer: Self-pay | Admitting: Internal Medicine

## 2015-03-15 ENCOUNTER — Other Ambulatory Visit: Payer: Self-pay

## 2015-03-16 ENCOUNTER — Other Ambulatory Visit: Payer: Self-pay | Admitting: Internal Medicine

## 2015-03-17 ENCOUNTER — Other Ambulatory Visit: Payer: Self-pay

## 2015-03-17 NOTE — Telephone Encounter (Signed)
Authorization required for next fill. Please advise

## 2015-03-23 ENCOUNTER — Other Ambulatory Visit: Payer: Self-pay | Admitting: Internal Medicine

## 2015-03-23 ENCOUNTER — Ambulatory Visit (AMBULATORY_SURGERY_CENTER): Payer: Self-pay

## 2015-03-23 VITALS — Ht 70.0 in | Wt 258.8 lb

## 2015-03-23 DIAGNOSIS — Z86010 Personal history of colonic polyps: Secondary | ICD-10-CM

## 2015-03-23 MED ORDER — NA SULFATE-K SULFATE-MG SULF 17.5-3.13-1.6 GM/177ML PO SOLN: ORAL | Status: DC

## 2015-03-23 NOTE — Progress Notes (Signed)
Per pt, no allergies to soy or egg products.Pt not taking any weight loss meds or using  O2 at home. 

## 2015-03-31 ENCOUNTER — Ambulatory Visit (AMBULATORY_SURGERY_CENTER): Payer: Medicare Other | Admitting: Gastroenterology

## 2015-03-31 ENCOUNTER — Encounter: Payer: Self-pay | Admitting: Gastroenterology

## 2015-03-31 VITALS — BP 134/76 | HR 62 | Temp 96.7°F | Resp 29

## 2015-03-31 DIAGNOSIS — D123 Benign neoplasm of transverse colon: Secondary | ICD-10-CM

## 2015-03-31 DIAGNOSIS — D120 Benign neoplasm of cecum: Secondary | ICD-10-CM | POA: Diagnosis not present

## 2015-03-31 DIAGNOSIS — Z1211 Encounter for screening for malignant neoplasm of colon: Secondary | ICD-10-CM

## 2015-03-31 MED ORDER — SODIUM CHLORIDE 0.9 % IV SOLN: 500.0000 mL | INTRAVENOUS | Status: DC

## 2015-03-31 NOTE — Op Note (Signed)
[place]  [address]. [place], [id number]   COLONOSCOPY PROCEDURE REPORT  PATIENT: Matthew Villanueva, Matthew Villanueva  MR#: [id number] BIRTHDATE: Mar 24, 1936 , 79  yrs. old GENDER: male ENDOSCOPIST: Ladene Artist, MD, [facility] [place] PROCEDURE DATE:  03/31/2015 PROCEDURE:   Colonoscopy, screening and Colonoscopy with biopsy First Screening Colonoscopy - Avg.  risk and is 50 yrs.  old or older - No.  Prior Negative Screening - Now for repeat screening. 10 or more years since last screening  History of Adenoma - Now for follow-up colonoscopy & has been > or = to 3 yrs.  N/A  Polyps removed today? Yes ASA CLASS:   Class III INDICATIONS:Screening for colonic neoplasia and Colorectal Neoplasm Risk Assessment for this procedure is average risk. MEDICATIONS: Monitored anesthesia care and Propofol 150 mg IV DESCRIPTION OF PROCEDURE:   After the risks benefits and alternatives of the procedure were thoroughly explained, informed consent was obtained.  The digital rectal exam revealed no abnormalities of the rectum.   The LB [id number]  endoscope was introduced through the anus and advanced to the cecum, which was identified by both the appendix and ileocecal valve. No adverse events experienced.   The quality of the prep was excellent. (Suprep was used)  The instrument was then slowly withdrawn as the colon was fully examined. Estimated blood loss is zero unless otherwise noted in this procedure report.    COLON FINDINGS: Two sessile polyps measuring 4 mm in size were found in the transverse colon and at the cecum.  Polypectomies were performed with cold forceps.  The resection was complete, the polyp tissue was completely retrieved and sent to histology.   The colonic mucosa appeared normal in the descending colon, sigmoid colon, rectum, at the splenic flexure, ileocecal valve, in the ascending colon, and at the hepatic flexure.  Retroflexed views revealed internal Grade I hemorrhoids.  The time to cecum = 3.1 Withdrawal time = 10.1   The scope was withdrawn and the procedure completed. COMPLICATIONS: There were no immediate complications.  ENDOSCOPIC IMPRESSION: 1.   Two sessile polyps in the transverse colon and at the cecum; polypectomies performed with cold forceps 2.   Grade I internal hemorrhoids  RECOMMENDATIONS: 1.  Await pathology results 2.  Given your age, you will not need another colonoscopy for colon cancer screening or polyp surveillance.  These types of tests usually stop around the age 49.  eSigned:  Ladene Artist, MD, [facility] 03/31/2015 9:56 AM

## 2015-03-31 NOTE — Progress Notes (Signed)
Called to room to assist during endoscopic procedure.  Patient ID and intended procedure confirmed with present staff. Received instructions for my participation in the procedure from the performing physician.  

## 2015-03-31 NOTE — Patient Instructions (Signed)
Discharge instructions given. Handouts on polyps and hemorrhoids. Resume previous medications. YOU HAD AN ENDOSCOPIC PROCEDURE TODAY AT THE  ENDOSCOPY CENTER:   Refer to the procedure report that was given to you for any specific questions about what was found during the examination.  If the procedure report does not answer your questions, please call your gastroenterologist to clarify.  If you requested that your care partner not be given the details of your procedure findings, then the procedure report has been included in a sealed envelope for you to review at your convenience later.  YOU SHOULD EXPECT: Some feelings of bloating in the abdomen. Passage of more gas than usual.  Walking can help get rid of the air that was put into your GI tract during the procedure and reduce the bloating. If you had a lower endoscopy (such as a colonoscopy or flexible sigmoidoscopy) you may notice spotting of blood in your stool or on the toilet paper. If you underwent a bowel prep for your procedure, you may not have a normal bowel movement for a few days.  Please Note:  You might notice some irritation and congestion in your nose or some drainage.  This is from the oxygen used during your procedure.  There is no need for concern and it should clear up in a day or so.  SYMPTOMS TO REPORT IMMEDIATELY:   Following lower endoscopy (colonoscopy or flexible sigmoidoscopy):  Excessive amounts of blood in the stool  Significant tenderness or worsening of abdominal pains  Swelling of the abdomen that is new, acute  Fever of 100F or higher   For urgent or emergent issues, a gastroenterologist can be reached at any hour by calling [phone].   DIET: Your first meal following the procedure should be a small meal and then it is ok to progress to your normal diet. Heavy or fried foods are harder to digest and may make you feel nauseous or bloated.  Likewise, meals heavy in dairy and vegetables can increase  bloating.  Drink plenty of fluids but you should avoid alcoholic beverages for 24 hours.  ACTIVITY:  You should plan to take it easy for the rest of today and you should NOT DRIVE or use heavy machinery until tomorrow (because of the sedation medicines used during the test).    FOLLOW UP: Our staff will call the number listed on your records the next business day following your procedure to check on you and address any questions or concerns that you may have regarding the information given to you following your procedure. If we do not reach you, we will leave a message.  However, if you are feeling well and you are not experiencing any problems, there is no need to return our call.  We will assume that you have returned to your regular daily activities without incident.  If any biopsies were taken you will be contacted by phone or by letter within the next 1-3 weeks.  Please call us at [phone] if you have not heard about the biopsies in 3 weeks.    SIGNATURES/CONFIDENTIALITY: You and/or your care partner have signed paperwork which will be entered into your electronic medical record.  These signatures attest to the fact that that the information above on your After Visit Summary has been reviewed and is understood.  Full responsibility of the confidentiality of this discharge information lies with you and/or your care-partner. 

## 2015-03-31 NOTE — Progress Notes (Signed)
A/ox3 pleased with MAC, report to Celia RN 

## 2015-04-01 ENCOUNTER — Telehealth: Payer: Self-pay | Admitting: Emergency Medicine

## 2015-04-01 NOTE — Telephone Encounter (Signed)
Left message, no identifier present

## 2015-04-13 ENCOUNTER — Encounter: Payer: Self-pay | Admitting: Gastroenterology

## 2015-04-14 LAB — HM COLONOSCOPY

## 2015-05-04 ENCOUNTER — Other Ambulatory Visit (INDEPENDENT_AMBULATORY_CARE_PROVIDER_SITE_OTHER): Payer: Medicare Other

## 2015-05-04 ENCOUNTER — Encounter: Payer: Self-pay | Admitting: Internal Medicine

## 2015-05-04 ENCOUNTER — Ambulatory Visit (INDEPENDENT_AMBULATORY_CARE_PROVIDER_SITE_OTHER): Payer: Medicare Other | Admitting: Internal Medicine

## 2015-05-04 VITALS — BP 118/70 | HR 82 | Temp 97.9°F | Resp 16 | Ht 70.0 in | Wt 262.0 lb

## 2015-05-04 DIAGNOSIS — E785 Hyperlipidemia, unspecified: Secondary | ICD-10-CM

## 2015-05-04 DIAGNOSIS — Z23 Encounter for immunization: Secondary | ICD-10-CM | POA: Diagnosis not present

## 2015-05-04 DIAGNOSIS — M1000 Idiopathic gout, unspecified site: Secondary | ICD-10-CM | POA: Diagnosis not present

## 2015-05-04 DIAGNOSIS — I10 Essential (primary) hypertension: Secondary | ICD-10-CM

## 2015-05-04 DIAGNOSIS — E1121 Type 2 diabetes mellitus with diabetic nephropathy: Secondary | ICD-10-CM

## 2015-05-04 DIAGNOSIS — J209 Acute bronchitis, unspecified: Secondary | ICD-10-CM

## 2015-05-04 LAB — LIPID PANEL
CHOL/HDL RATIO: 4
Cholesterol: 135 mg/dL (ref 0–200)
HDL: 37.7 mg/dL — AB (ref >39.00)
NONHDL: 97.66
TRIGLYCERIDES: 259 mg/dL — AB (ref 0.0–149.0)
VLDL: 51.8 mg/dL — AB (ref 0.0–40.0)

## 2015-05-04 LAB — URINALYSIS, ROUTINE W REFLEX MICROSCOPIC
Bilirubin Urine: NEGATIVE
Hgb urine dipstick: NEGATIVE
KETONES UR: NEGATIVE
LEUKOCYTES UA: NEGATIVE
Nitrite: NEGATIVE
SPECIFIC GRAVITY, URINE: 1.02 (ref 1.000–1.030)
Urine Glucose: NEGATIVE
Urobilinogen, UA: 0.2 (ref 0.0–1.0)
pH: 6 (ref 5.0–8.0)

## 2015-05-04 LAB — COMPREHENSIVE METABOLIC PANEL
ALT: 21 U/L (ref 0–53)
AST: 18 U/L (ref 0–37)
Albumin: 3.9 g/dL (ref 3.5–5.2)
Alkaline Phosphatase: 22 U/L — ABNORMAL LOW (ref 39–117)
BUN: 15 mg/dL (ref 6–23)
CALCIUM: 9.4 mg/dL (ref 8.4–10.5)
CHLORIDE: 101 meq/L (ref 96–112)
CO2: 29 meq/L (ref 19–32)
CREATININE: 0.88 mg/dL (ref 0.40–1.50)
GFR: 89.21 mL/min (ref >60.00)
Glucose, Bld: 150 mg/dL — ABNORMAL HIGH (ref 70–99)
Potassium: 4 mEq/L (ref 3.5–5.1)
Sodium: 138 mEq/L (ref 135–145)
Total Bilirubin: 0.5 mg/dL (ref 0.2–1.2)
Total Protein: 7.1 g/dL (ref 6.0–8.3)

## 2015-05-04 LAB — CBC WITH DIFFERENTIAL/PLATELET
BASOS PCT: 0.3 % (ref 0.0–3.0)
Basophils Absolute: 0 10*3/uL (ref 0.0–0.1)
Eosinophils Absolute: 0.4 10*3/uL (ref 0.0–0.7)
Eosinophils Relative: 3.5 % (ref 0.0–5.0)
HEMATOCRIT: 46.2 % (ref 39.0–52.0)
Hemoglobin: 15.5 g/dL (ref 13.0–17.0)
LYMPHS PCT: 16.4 % (ref 12.0–46.0)
Lymphs Abs: 2.1 10*3/uL (ref 0.7–4.0)
MCHC: 33.6 g/dL (ref 30.0–36.0)
MCV: 87.1 fl (ref 78.0–100.0)
MONOS PCT: 9.2 % (ref 3.0–12.0)
Monocytes Absolute: 1.2 10*3/uL — ABNORMAL HIGH (ref 0.1–1.0)
NEUTROS ABS: 9 10*3/uL — AB (ref 1.4–7.7)
Neutrophils Relative %: 70.6 % (ref 43.0–77.0)
PLATELETS: 257 10*3/uL (ref 150.0–400.0)
RBC: 5.3 Mil/uL (ref 4.22–5.81)
RDW: 14.5 % (ref 11.5–15.5)
WBC: 12.7 10*3/uL — ABNORMAL HIGH (ref 4.0–10.5)

## 2015-05-04 LAB — HEMOGLOBIN A1C: Hgb A1c MFr Bld: 6.9 % — ABNORMAL HIGH (ref 4.6–6.5)

## 2015-05-04 LAB — MICROALBUMIN / CREATININE URINE RATIO
CREATININE, U: 113 mg/dL
MICROALB UR: 11 mg/dL — AB (ref 0.0–1.9)
Microalb Creat Ratio: 9.7 mg/g (ref 0.0–30.0)

## 2015-05-04 LAB — TSH: TSH: 1.44 u[IU]/mL (ref 0.35–4.50)

## 2015-05-04 LAB — LDL CHOLESTEROL, DIRECT: Direct LDL: 77 mg/dL

## 2015-05-04 MED ORDER — AZITHROMYCIN 500 MG PO TABS: 500.0000 mg | ORAL_TABLET | Freq: Every day | ORAL | Status: DC

## 2015-05-04 MED ORDER — COLCHICINE 0.6 MG PO TABS: ORAL_TABLET | ORAL | Status: DC

## 2015-05-04 MED ORDER — PROMETHAZINE-DM 6.25-15 MG/5ML PO SYRP: 5.0000 mL | ORAL_SOLUTION | Freq: Four times a day (QID) | ORAL | Status: DC | PRN

## 2015-05-04 MED ORDER — CANAGLIFLOZIN-METFORMIN HCL 150-1000 MG PO TABS: ORAL_TABLET | ORAL | Status: DC

## 2015-05-04 NOTE — Patient Instructions (Signed)

## 2015-05-04 NOTE — Progress Notes (Signed)
Pre visit review using our clinic review tool, if applicable. No additional management support is needed unless otherwise documented below in the visit note. 

## 2015-05-04 NOTE — Progress Notes (Signed)
Subjective:  Patient ID: Matthew Villanueva, male    DOB: 10/30/1935  Age: 79 y.o. MRN: [id number]  CC: Cough and Diabetes   HPI Jerrad Piontek presents for a 5 day history of cough productive of yellow phlegm with sore throat. He complains that the cough is severe at night and has been keeping him awake.  Outpatient Prescriptions Prior to Visit  Medication Sig Dispense Refill  . aspirin 81 MG tablet Take 81 mg by mouth daily.      Marland Kitchen losartan-hydrochlorothiazide (HYZAAR) 100-25 MG per tablet Take 1 tablet by mouth daily. 90 tablet 3  . Multiple Vitamin (MULTIVITAMIN) tablet Take 1 tablet by mouth daily. Reported on 05/04/2015    . sildenafil (REVATIO) 20 MG tablet Take 1 tablet (20 mg total) by mouth daily. One qd as needed 10 tablet 11  . cyanocobalamin 100 MCG tablet Take 100 mcg by mouth daily. Reported on 05/04/2015    . ezetimibe (ZETIA) 10 MG tablet Take 1 tablet (10 mg total) by mouth daily. (Patient not taking: Reported on 05/04/2015) 90 tablet 3  . Canagliflozin-Metformin HCl (INVOKAMET) [phone] MG TABS TAKE 1 TABLET BY MOUTH 2 (TWO) TIMES DAILY. (Patient not taking: Reported on 05/04/2015) 60 tablet 0  . COLCRYS 0.6 MG tablet TAKE 2 TABLETS (1.2 MG TOTAL) BY MOUTH DAILY. REPEAT 1 TABLET 2 HRS LATER IF NEEDED (Patient not taking: Reported on 05/04/2015) 12 tablet 2   No facility-administered medications prior to visit.    ROS Review of Systems  Constitutional: Negative.  Negative for fever, chills, diaphoresis, appetite change and fatigue.  HENT: Positive for sore throat. Negative for congestion, sinus pressure and trouble swallowing.   Eyes: Negative.   Respiratory: Positive for cough. Negative for choking, chest tightness, shortness of breath, wheezing and stridor.   Cardiovascular: Negative.  Negative for chest pain, palpitations and leg swelling.  Gastrointestinal: Negative.  Negative for nausea, vomiting, abdominal pain, diarrhea, constipation and blood in  stool.  Endocrine: Negative.  Negative for polydipsia, polyphagia and polyuria.  Genitourinary: Negative.  Negative for difficulty urinating.  Musculoskeletal: Negative.  Negative for myalgias, back pain, arthralgias and neck pain.  Skin: Negative.  Negative for color change and rash.  Allergic/Immunologic: Negative.   Neurological: Negative.  Negative for dizziness, weakness and light-headedness.  Hematological: Negative.  Negative for adenopathy. Does not bruise/bleed easily.  Psychiatric/Behavioral: Negative.     Objective:  BP 118/70 mmHg  Pulse 82  Temp(Src) 97.9 F (36.6 C) (Oral)  Resp 16  Ht 5\' 10"  (1.778 m)  Wt 262 lb (118.842 kg)  BMI 37.59 kg/m2  SpO2 94%  BP Readings from Last 3 Encounters:  05/04/15 118/70  03/31/15 134/76  06/10/14 116/70    Wt Readings from Last 3 Encounters:  05/04/15 262 lb (118.842 kg)  03/23/15 258 lb 12.8 oz (117.391 kg)  06/10/14 257 lb (116.574 kg)    Physical Exam  Constitutional: He is oriented to person, place, and time. He appears well-developed and well-nourished.  Non-toxic appearance. He does not have a sickly appearance. He does not appear ill. No distress.  HENT:  Mouth/Throat: Oropharynx is clear and moist and mucous membranes are normal. Mucous membranes are not pale, not dry and not cyanotic. No oral lesions. No trismus in the jaw. No uvula swelling. No oropharyngeal exudate, posterior oropharyngeal edema, posterior oropharyngeal erythema or tonsillar abscesses.  Eyes: Conjunctivae are normal. Right eye exhibits no discharge. Left eye exhibits no discharge. No scleral icterus.  Neck: Normal range of  motion. Neck supple. No JVD present. No tracheal deviation present. No thyromegaly present.  Cardiovascular: Normal rate, regular rhythm, normal heart sounds and intact distal pulses.  Exam reveals no gallop and no friction rub.   No murmur heard. Pulmonary/Chest: Effort normal and breath sounds normal. No stridor. No respiratory  distress. He has no decreased breath sounds. He has no wheezes. He has no rhonchi. He has no rales. He exhibits no tenderness.  Abdominal: Soft. Bowel sounds are normal. He exhibits no distension and no mass. There is no tenderness. There is no rebound and no guarding.  Musculoskeletal: Normal range of motion. He exhibits no edema or tenderness.  Lymphadenopathy:    He has no cervical adenopathy.  Neurological: He is oriented to person, place, and time.  Skin: Skin is warm and dry. No rash noted. He is not diaphoretic. No erythema. No pallor.  Psychiatric: He has a normal mood and affect. His behavior is normal. Judgment and thought content normal.  Vitals reviewed.   Lab Results  Component Value Date   WBC 12.7* 05/04/2015   HGB 15.5 05/04/2015   HCT 46.2 05/04/2015   PLT 257.0 05/04/2015   GLUCOSE 150* 05/04/2015   CHOL 135 05/04/2015   TRIG 259.0* 05/04/2015   HDL 37.70* 05/04/2015   LDLDIRECT 77.0 05/04/2015   LDLCALC 72 12/24/2013   ALT 21 05/04/2015   AST 18 05/04/2015   NA 138 05/04/2015   K 4.0 05/04/2015   CL 101 05/04/2015   CREATININE 0.88 05/04/2015   BUN 15 05/04/2015   CO2 29 05/04/2015   TSH 1.44 05/04/2015   PSA 1.88 08/12/2013   HGBA1C 6.9* 05/04/2015   MICROALBUR 11.0* 05/04/2015    Dg Chest 2 View  05/25/2014  CLINICAL DATA:  Dry cough for 3 weeks, low-grade fever, nasal drainage EXAM: CHEST  2 VIEW COMPARISON:  05/31/2011 FINDINGS: Cardiomediastinal silhouette is unremarkable. There is chronic elevation of the right hemidiaphragm. Degenerative changes mid thoracic spine. No acute infiltrate or pulmonary edema. IMPRESSION: No active cardiopulmonary disease. Chronic elevation of the right hemidiaphragm. Degenerative changes mid thoracic spine. Electronically Signed   By: Lahoma Crocker M.D.   On: 05/25/2014 14:00    Assessment & Plan:   Fayne was seen today for cough and diabetes.  Diagnoses and all orders for this visit:  Encounter for  immunization  Type 2 diabetes mellitus with diabetic nephropathy, without long-term current use of insulin ([place])- his A1c is down to 6.9%, his blood sugars are well-controlled, will continue Invokamet, his renal function remains normal -     Canagliflozin-Metformin HCl (INVOKAMET) [phone] MG TABS; TAKE 1 TABLET BY MOUTH 2 (TWO) TIMES DAILY. -     Microalbumin / creatinine urine ratio; Future -     Comprehensive metabolic panel; Future -     CBC with Differential/Platelet; Future -     Hemoglobin A1c; Future  Hyperlipidemia with target LDL less than 100- he has achieved his LDL goal and is not willing to take a statin -     Lipid panel; Future -     TSH; Future -     Comprehensive metabolic panel; Future -     CBC with Differential/Platelet; Future  Essential hypertension- his blood pressure is well-controlled, electrolytes and renal function are stable. -     Urinalysis, Routine w reflex microscopic (not at [facility]); Future -     Comprehensive metabolic panel; Future -     CBC with Differential/Platelet; Future  Idiopathic gout, unspecified chronicity, unspecified  site -     colchicine (COLCRYS) 0.6 MG tablet; TAKE 2 TABLETS (1.2 MG TOTAL) BY MOUTH DAILY. REPEAT 1 TABLET 2 HRS LATER IF NEEDED  Acute bronchitis, unspecified organism- will treat the infection with Zithromax and control the cough with Phenergan DM. -     promethazine-dextromethorphan (PROMETHAZINE-DM) 6.25-15 MG/5ML syrup; Take 5 mLs by mouth 4 (four) times daily as needed for cough. -     azithromycin (ZITHROMAX) 500 MG tablet; Take 1 tablet (500 mg total) by mouth daily.  Need for Tdap vaccination -     Tdap vaccine greater than or equal to 7yo IM  Other orders -     Flu Vaccine QUAD 36+ mos IM -     Cancel: Tdap vaccine greater than or equal to 7yo IM   I have changed Mr. Leventhal COLCRYS to colchicine. I am also having him start on promethazine-dextromethorphan and azithromycin. Additionally, I am having him  maintain his aspirin, sildenafil, ezetimibe, losartan-hydrochlorothiazide, multivitamin, cyanocobalamin, and Canagliflozin-Metformin HCl.  Meds ordered this encounter  Medications  . Canagliflozin-Metformin HCl (INVOKAMET) [phone] MG TABS    Sig: TAKE 1 TABLET BY MOUTH 2 (TWO) TIMES DAILY.    Dispense:  60 tablet    Refill:  5    CYCLE FILL MEDICATION. Authorization is required for next refill.  . colchicine (COLCRYS) 0.6 MG tablet    Sig: TAKE 2 TABLETS (1.2 MG TOTAL) BY MOUTH DAILY. REPEAT 1 TABLET 2 HRS LATER IF NEEDED    Dispense:  12 tablet    Refill:  2    CYCLE FILL MEDICATION. Authorization is required for next refill.  . promethazine-dextromethorphan (PROMETHAZINE-DM) 6.25-15 MG/5ML syrup    Sig: Take 5 mLs by mouth 4 (four) times daily as needed for cough.    Dispense:  118 mL    Refill:  0  . azithromycin (ZITHROMAX) 500 MG tablet    Sig: Take 1 tablet (500 mg total) by mouth daily.    Dispense:  3 tablet    Refill:  0     Follow-up: Return in about 3 weeks (around 05/25/2015).  Scarlette Calico, MD

## 2015-06-07 ENCOUNTER — Telehealth: Payer: Self-pay | Admitting: Internal Medicine

## 2015-06-07 NOTE — Telephone Encounter (Signed)
Can you please call pt at [phone]. He has some questions regarding his metformin and INVOKAMET

## 2015-06-09 NOTE — Telephone Encounter (Signed)
Clarification given. Pt is to continue invokamet

## 2015-06-17 ENCOUNTER — Other Ambulatory Visit: Payer: Self-pay | Admitting: Internal Medicine

## 2015-08-17 DIAGNOSIS — H25013 Cortical age-related cataract, bilateral: Secondary | ICD-10-CM | POA: Diagnosis not present

## 2015-08-17 DIAGNOSIS — E119 Type 2 diabetes mellitus without complications: Secondary | ICD-10-CM | POA: Diagnosis not present

## 2015-08-17 DIAGNOSIS — H35031 Hypertensive retinopathy, right eye: Secondary | ICD-10-CM | POA: Diagnosis not present

## 2015-08-17 DIAGNOSIS — H2513 Age-related nuclear cataract, bilateral: Secondary | ICD-10-CM | POA: Diagnosis not present

## 2015-08-17 DIAGNOSIS — H35032 Hypertensive retinopathy, left eye: Secondary | ICD-10-CM | POA: Diagnosis not present

## 2015-08-17 LAB — HM DIABETES EYE EXAM

## 2015-08-24 ENCOUNTER — Encounter: Payer: Self-pay | Admitting: Internal Medicine

## 2015-10-19 ENCOUNTER — Encounter: Payer: Self-pay | Admitting: Internal Medicine

## 2015-10-19 ENCOUNTER — Ambulatory Visit (INDEPENDENT_AMBULATORY_CARE_PROVIDER_SITE_OTHER): Payer: Medicare Other | Admitting: Internal Medicine

## 2015-10-19 ENCOUNTER — Other Ambulatory Visit (INDEPENDENT_AMBULATORY_CARE_PROVIDER_SITE_OTHER): Payer: Medicare Other

## 2015-10-19 VITALS — BP 128/74 | HR 89 | Temp 98.3°F | Resp 20 | Wt 262.0 lb

## 2015-10-19 DIAGNOSIS — N401 Enlarged prostate with lower urinary tract symptoms: Secondary | ICD-10-CM

## 2015-10-19 DIAGNOSIS — R351 Nocturia: Secondary | ICD-10-CM

## 2015-10-19 DIAGNOSIS — I10 Essential (primary) hypertension: Secondary | ICD-10-CM | POA: Diagnosis not present

## 2015-10-19 DIAGNOSIS — E785 Hyperlipidemia, unspecified: Secondary | ICD-10-CM | POA: Diagnosis not present

## 2015-10-19 DIAGNOSIS — L918 Other hypertrophic disorders of the skin: Secondary | ICD-10-CM | POA: Diagnosis not present

## 2015-10-19 DIAGNOSIS — E1121 Type 2 diabetes mellitus with diabetic nephropathy: Secondary | ICD-10-CM

## 2015-10-19 DIAGNOSIS — M1000 Idiopathic gout, unspecified site: Secondary | ICD-10-CM

## 2015-10-19 DIAGNOSIS — N528 Other male erectile dysfunction: Secondary | ICD-10-CM

## 2015-10-19 DIAGNOSIS — R7989 Other specified abnormal findings of blood chemistry: Secondary | ICD-10-CM | POA: Diagnosis not present

## 2015-10-19 DIAGNOSIS — K130 Diseases of lips: Secondary | ICD-10-CM

## 2015-10-19 DIAGNOSIS — L989 Disorder of the skin and subcutaneous tissue, unspecified: Secondary | ICD-10-CM

## 2015-10-19 LAB — CBC WITH DIFFERENTIAL/PLATELET
BASOS ABS: 0 10*3/uL (ref 0.0–0.1)
Basophils Relative: 0.3 % (ref 0.0–3.0)
EOS ABS: 0.1 10*3/uL (ref 0.0–0.7)
Eosinophils Relative: 0.6 % (ref 0.0–5.0)
HCT: 49.9 % (ref 39.0–52.0)
Hemoglobin: 16.8 g/dL (ref 13.0–17.0)
LYMPHS ABS: 1.5 10*3/uL (ref 0.7–4.0)
LYMPHS PCT: 12 % (ref 12.0–46.0)
MCHC: 33.6 g/dL (ref 30.0–36.0)
MCV: 86.5 fl (ref 78.0–100.0)
Monocytes Absolute: 0.9 10*3/uL (ref 0.1–1.0)
Monocytes Relative: 7.4 % (ref 3.0–12.0)
NEUTROS ABS: 9.9 10*3/uL — AB (ref 1.4–7.7)
NEUTROS PCT: 79.7 % — AB (ref 43.0–77.0)
PLATELETS: 250 10*3/uL (ref 150.0–400.0)
RBC: 5.77 Mil/uL (ref 4.22–5.81)
RDW: 14.5 % (ref 11.5–15.5)
WBC: 12.4 10*3/uL — ABNORMAL HIGH (ref 4.0–10.5)

## 2015-10-19 LAB — URIC ACID: URIC ACID, SERUM: 6.7 mg/dL (ref 4.0–7.8)

## 2015-10-19 LAB — COMPREHENSIVE METABOLIC PANEL
ALK PHOS: 21 U/L — AB (ref 39–117)
ALT: 33 U/L (ref 0–53)
AST: 24 U/L (ref 0–37)
Albumin: 4.2 g/dL (ref 3.5–5.2)
BILIRUBIN TOTAL: 0.4 mg/dL (ref 0.2–1.2)
BUN: 26 mg/dL — ABNORMAL HIGH (ref 6–23)
CALCIUM: 9.5 mg/dL (ref 8.4–10.5)
CO2: 29 meq/L (ref 19–32)
CREATININE: 1.23 mg/dL (ref 0.40–1.50)
Chloride: 99 mEq/L (ref 96–112)
GFR: 60.54 mL/min (ref >60.00)
GLUCOSE: 155 mg/dL — AB (ref 70–99)
Potassium: 4.2 mEq/L (ref 3.5–5.1)
Sodium: 137 mEq/L (ref 135–145)
TOTAL PROTEIN: 7 g/dL (ref 6.0–8.3)

## 2015-10-19 LAB — LIPID PANEL
Cholesterol: 152 mg/dL (ref 0–200)
HDL: 41.4 mg/dL (ref >39.00)
NONHDL: 110.97
Total CHOL/HDL Ratio: 4
Triglycerides: 361 mg/dL — ABNORMAL HIGH (ref 0.0–149.0)
VLDL: 72.2 mg/dL — ABNORMAL HIGH (ref 0.0–40.0)

## 2015-10-19 LAB — HEMOGLOBIN A1C: HEMOGLOBIN A1C: 7 % — AB (ref 4.6–6.5)

## 2015-10-19 LAB — PSA: PSA: 2.87 ng/mL (ref 0.10–4.00)

## 2015-10-19 LAB — LDL CHOLESTEROL, DIRECT: Direct LDL: 85 mg/dL

## 2015-10-19 MED ORDER — SILDENAFIL CITRATE 20 MG PO TABS: 20.0000 mg | ORAL_TABLET | Freq: Every day | ORAL | Status: DC

## 2015-10-19 NOTE — Progress Notes (Addendum)
Subjective:  Patient ID: Matthew Villanueva, male    DOB: 04-22-1936  Age: 80 y.o. MRN: [id number]  CC: Hyperlipidemia and Diabetes   HPI Matthew Villanueva presents for follow-up on diabetes and hyperlipidemia.  He complains that over the last few months that he has developed some skin tags. The two that concern him the most are on the left lower lip and the right upper scalp. He also has some in his groin or axillary region but those do not bother him.  He is due for follow-up on blood sugar is well, he is tolerating his current medical regimen with no side effects. He denies episodes of polyuria/polydipsia/polyphagia.  Outpatient Prescriptions Prior to Visit  Medication Sig Dispense Refill  . aspirin 81 MG tablet Take 81 mg by mouth daily.      . Canagliflozin-Metformin HCl (INVOKAMET) [phone] MG TABS TAKE 1 TABLET BY MOUTH 2 (TWO) TIMES DAILY. 60 tablet 5  . colchicine (COLCRYS) 0.6 MG tablet TAKE 2 TABLETS (1.2 MG TOTAL) BY MOUTH DAILY. REPEAT 1 TABLET 2 HRS LATER IF NEEDED 12 tablet 2  . losartan-hydrochlorothiazide (HYZAAR) 100-25 MG tablet TAKE 1 TABLET BY MOUTH DAILY. 90 tablet 2  . Multiple Vitamin (MULTIVITAMIN) tablet Take 1 tablet by mouth daily. Reported on 05/04/2015    . sildenafil (REVATIO) 20 MG tablet Take 1 tablet (20 mg total) by mouth daily. One qd as needed 10 tablet 11  . azithromycin (ZITHROMAX) 500 MG tablet Take 1 tablet (500 mg total) by mouth daily. 3 tablet 0  . cyanocobalamin 100 MCG tablet Take 100 mcg by mouth daily. Reported on 05/04/2015    . ezetimibe (ZETIA) 10 MG tablet Take 1 tablet (10 mg total) by mouth daily. (Patient not taking: Reported on 05/04/2015) 90 tablet 3  . promethazine-dextromethorphan (PROMETHAZINE-DM) 6.25-15 MG/5ML syrup Take 5 mLs by mouth 4 (four) times daily as needed for cough. 118 mL 0   No facility-administered medications prior to visit.    ROS Review of Systems  Constitutional: Negative.  Negative for fever,  chills and fatigue.  HENT: Negative.   Eyes: Negative.   Respiratory: Negative.  Negative for cough, choking, chest tightness, shortness of breath and stridor.   Cardiovascular: Negative.  Negative for chest pain and leg swelling.  Gastrointestinal: Negative.  Negative for nausea, vomiting, abdominal pain, diarrhea and constipation.  Endocrine: Negative.  Negative for polydipsia, polyphagia and polyuria.  Genitourinary: Negative.  Negative for dysuria and difficulty urinating.  Musculoskeletal: Negative.  Negative for myalgias, back pain, arthralgias and neck pain.  Skin: Negative.  Negative for color change and rash.  Allergic/Immunologic: Negative.   Neurological: Negative.  Negative for dizziness, tremors, weakness, light-headedness, numbness and headaches.  Hematological: Negative.  Negative for adenopathy. Does not bruise/bleed easily.  Psychiatric/Behavioral: Negative.     Objective:  BP 128/74 mmHg  Pulse 89  Temp(Src) 98.3 F (36.8 C) (Oral)  Resp 20  Wt 262 lb (118.842 kg)  SpO2 90%  BP Readings from Last 3 Encounters:  10/19/15 128/74  05/04/15 118/70  03/31/15 134/76    Wt Readings from Last 3 Encounters:  10/19/15 262 lb (118.842 kg)  05/04/15 262 lb (118.842 kg)  03/23/15 258 lb 12.8 oz (117.391 kg)    Physical Exam  Constitutional: He is oriented to person, place, and time. No distress.  HENT:  Mouth/Throat: Oropharynx is clear and moist. No oropharyngeal exudate.    Eyes: Conjunctivae are normal. Right eye exhibits no discharge. Left eye exhibits no discharge. No  scleral icterus.  Neck: Normal range of motion. Neck supple. No JVD present. No tracheal deviation present. No thyromegaly present.  Cardiovascular: Normal rate, regular rhythm, normal heart sounds and intact distal pulses.  Exam reveals no gallop and no friction rub.   No murmur heard. Pulmonary/Chest: Effort normal and breath sounds normal. No stridor. No respiratory distress. He has no  wheezes. He has no rales. He exhibits no tenderness.  Abdominal: Soft. Bowel sounds are normal. He exhibits no distension and no mass. There is no tenderness. There is no rebound and no guarding.  Musculoskeletal: Normal range of motion. He exhibits no edema or tenderness.  Lymphadenopathy:    He has no cervical adenopathy.  Neurological: He is oriented to person, place, and time.  Skin: Skin is warm and dry. No rash noted. He is not diaphoretic. No erythema. No pallor.     Vitals reviewed.   Lab Results  Component Value Date   WBC 12.4* 10/19/2015   HGB 16.8 10/19/2015   HCT 49.9 10/19/2015   PLT 250.0 10/19/2015   GLUCOSE 155* 10/19/2015   CHOL 152 10/19/2015   TRIG 361.0* 10/19/2015   HDL 41.40 10/19/2015   LDLDIRECT 85.0 10/19/2015   LDLCALC 72 12/24/2013   ALT 33 10/19/2015   AST 24 10/19/2015   NA 137 10/19/2015   K 4.2 10/19/2015   CL 99 10/19/2015   CREATININE 1.23 10/19/2015   BUN 26* 10/19/2015   CO2 29 10/19/2015   TSH 1.44 05/04/2015   PSA 2.87 10/19/2015   HGBA1C 7.0* 10/19/2015   MICROALBUR 11.0* 05/04/2015    Dg Chest 2 View  05/25/2014  CLINICAL DATA:  Dry cough for 3 weeks, low-grade fever, nasal drainage EXAM: CHEST  2 VIEW COMPARISON:  05/31/2011 FINDINGS: Cardiomediastinal silhouette is unremarkable. There is chronic elevation of the right hemidiaphragm. Degenerative changes mid thoracic spine. No acute infiltrate or pulmonary edema. IMPRESSION: No active cardiopulmonary disease. Chronic elevation of the right hemidiaphragm. Degenerative changes mid thoracic spine. Electronically Signed   By: Lahoma Crocker M.D.   On: 05/25/2014 14:00    Assessment & Plan:   Matthew Villanueva was seen today for hyperlipidemia and diabetes.  Diagnoses and all orders for this visit:  Skin lesion of scalp- snipped and removed, appears to be a skin tag, will send to pathology for confirmation -     Dermatology pathology  Type 2 diabetes mellitus with diabetic nephropathy, without  long-term current use of insulin ([place])- his blood sugars are adequately well-controlled, is a very high C-peptide, will continue the current combination of metformin plus an SGL T2 inhibitor -     Comprehensive metabolic panel; Future -     Hemoglobin A1c; Future -     C-peptide; Future  Essential hypertension- blood pressure is well-controlled, electrolytes and renal function are stable. -     Comprehensive metabolic panel; Future -     CBC with Differential/Platelet; Future  Idiopathic gout, unspecified chronicity, unspecified site- he has achieved his uric acid level goal, he has had no recent flares of gout -     Uric acid; Future  Other male erectile dysfunction -     sildenafil (REVATIO) 20 MG tablet; Take 1 tablet (20 mg total) by mouth daily. One qd as needed  Hyperlipidemia with target LDL less than 100- he is achieved his LDL goal would Zetia, he refuses to take a statin due to prior side effects -     Lipid panel; Future  Lip lesion- this was easily  snipped and removed and sent for pathology, it appears to be a skin tag but will check the pathology results and evaluate further if needed. -     Dermatology pathology  BPH associated with nocturia- his PSA has gone up modestly over the last year or 2, I have asked him to return for complete physical with a prostate exam and will continue to monitor his PSA over the next 4-6 months. -     PSA; Future  I have discontinued Mr. Monegro ezetimibe, cyanocobalamin, promethazine-dextromethorphan, and azithromycin. I am also having him maintain his aspirin, multivitamin, Canagliflozin-Metformin HCl, colchicine, losartan-hydrochlorothiazide, and sildenafil.  Meds ordered this encounter  Medications  . sildenafil (REVATIO) 20 MG tablet    Sig: Take 1 tablet (20 mg total) by mouth daily. One qd as needed    Dispense:  10 tablet    Refill:  11     Follow-up: Return in about 4 months (around 02/18/2016).  Scarlette Calico, MD

## 2015-10-19 NOTE — Patient Instructions (Signed)

## 2015-10-20 ENCOUNTER — Encounter: Payer: Self-pay | Admitting: Internal Medicine

## 2015-10-20 LAB — C-PEPTIDE: C-Peptide: 13.09 ng/mL — ABNORMAL HIGH (ref 0.80–3.85)

## 2015-10-20 NOTE — Addendum Note (Signed)
Addended by: [name] on: 10/20/2015 12:15 PM   Modules accepted: Miquel Dunn

## 2015-11-09 ENCOUNTER — Ambulatory Visit (INDEPENDENT_AMBULATORY_CARE_PROVIDER_SITE_OTHER): Payer: Medicare Other | Admitting: Internal Medicine

## 2015-11-09 ENCOUNTER — Other Ambulatory Visit (INDEPENDENT_AMBULATORY_CARE_PROVIDER_SITE_OTHER): Payer: Medicare Other

## 2015-11-09 ENCOUNTER — Encounter: Payer: Self-pay | Admitting: Internal Medicine

## 2015-11-09 VITALS — BP 120/70 | HR 75 | Temp 98.3°F | Resp 16 | Ht 70.0 in | Wt 261.0 lb

## 2015-11-09 DIAGNOSIS — Z0000 Encounter for general adult medical examination without abnormal findings: Secondary | ICD-10-CM | POA: Diagnosis not present

## 2015-11-09 DIAGNOSIS — I10 Essential (primary) hypertension: Secondary | ICD-10-CM

## 2015-11-09 DIAGNOSIS — D72829 Elevated white blood cell count, unspecified: Secondary | ICD-10-CM | POA: Insufficient documentation

## 2015-11-09 DIAGNOSIS — B3742 Candidal balanitis: Secondary | ICD-10-CM | POA: Insufficient documentation

## 2015-11-09 DIAGNOSIS — E669 Obesity, unspecified: Secondary | ICD-10-CM

## 2015-11-09 DIAGNOSIS — E781 Pure hyperglyceridemia: Secondary | ICD-10-CM

## 2015-11-09 DIAGNOSIS — E1121 Type 2 diabetes mellitus with diabetic nephropathy: Secondary | ICD-10-CM | POA: Diagnosis not present

## 2015-11-09 DIAGNOSIS — G4733 Obstructive sleep apnea (adult) (pediatric): Secondary | ICD-10-CM

## 2015-11-09 LAB — CBC WITH DIFFERENTIAL/PLATELET
Basophils Absolute: 0 10*3/uL (ref 0.0–0.1)
Basophils Relative: 0.3 % (ref 0.0–3.0)
Eosinophils Absolute: 0.1 10*3/uL (ref 0.0–0.7)
Eosinophils Relative: 1.1 % (ref 0.0–5.0)
HEMATOCRIT: 50.9 % (ref 39.0–52.0)
HEMOGLOBIN: 17.1 g/dL — AB (ref 13.0–17.0)
LYMPHS PCT: 14.9 % (ref 12.0–46.0)
Lymphs Abs: 1.8 10*3/uL (ref 0.7–4.0)
MCHC: 33.6 g/dL (ref 30.0–36.0)
MCV: 86 fl (ref 78.0–100.0)
MONOS PCT: 8.9 % (ref 3.0–12.0)
Monocytes Absolute: 1.1 10*3/uL — ABNORMAL HIGH (ref 0.1–1.0)
NEUTROS ABS: 9 10*3/uL — AB (ref 1.4–7.7)
Neutrophils Relative %: 74.8 % (ref 43.0–77.0)
Platelets: 256 10*3/uL (ref 150.0–400.0)
RBC: 5.92 Mil/uL — ABNORMAL HIGH (ref 4.22–5.81)
RDW: 15 % (ref 11.5–15.5)
WBC: 12.1 10*3/uL — AB (ref 4.0–10.5)

## 2015-11-09 LAB — URINALYSIS, ROUTINE W REFLEX MICROSCOPIC
BILIRUBIN URINE: NEGATIVE
Hgb urine dipstick: NEGATIVE
Ketones, ur: NEGATIVE
Leukocytes, UA: NEGATIVE
Nitrite: NEGATIVE
PH: 5.5 (ref 5.0–8.0)
RBC / HPF: NONE SEEN (ref 0–?)
SPECIFIC GRAVITY, URINE: 1.01 (ref 1.000–1.030)
Total Protein, Urine: NEGATIVE
UROBILINOGEN UA: 0.2 (ref 0.0–1.0)
Urine Glucose: >=1000 — AB
WBC UA: NONE SEEN (ref 0–?)

## 2015-11-09 LAB — FECAL OCCULT BLOOD, GUAIAC: Fecal Occult Blood: NEGATIVE

## 2015-11-09 MED ORDER — ICOSAPENT ETHYL 1 G PO CAPS: 2.0000 | ORAL_CAPSULE | Freq: Two times a day (BID) | ORAL | Status: DC

## 2015-11-09 MED ORDER — KETOCONAZOLE 2 % EX CREA: 1.0000 "application " | TOPICAL_CREAM | Freq: Every day | CUTANEOUS | Status: DC

## 2015-11-09 MED ORDER — CANAGLIFLOZIN-METFORMIN HCL 150-1000 MG PO TABS: ORAL_TABLET | ORAL | Status: DC

## 2015-11-09 NOTE — Patient Instructions (Signed)

## 2015-11-09 NOTE — Assessment & Plan Note (Signed)

## 2015-11-09 NOTE — Progress Notes (Signed)
Pre visit review using our clinic review tool, if applicable. No additional management support is needed unless otherwise documented below in the visit note. 

## 2015-11-09 NOTE — Progress Notes (Signed)
Subjective:  Patient ID: Matthew Villanueva, male    DOB: 1936-03-27  Age: 80 y.o. MRN: [id number]  CC: Hyperlipidemia; Hypertension; Diabetes; and Annual Exam   HPI Matthew Villanueva presents for a CPX.  He complains of redness under his foreskin but denies dysuria or hematuria. He tells me his blood pressure has been well controlled with a combination of losartan and hydrochlorothiazide. He denies any recent episodes of headache, blurred vision, chest pain, shortness of breath, palpitations, edema, or fatigue. He tells me his blood sugar has been well controlled. He complains of persistent sleep apnea. Years ago he tried to use a CPAP machine but it was too cumbersome, he wants to be seen by sleep medicine again to see if some of the new devices are more palatable to him.  Outpatient Prescriptions Prior to Visit  Medication Sig Dispense Refill  . aspirin 81 MG tablet Take 81 mg by mouth daily.      . colchicine (COLCRYS) 0.6 MG tablet TAKE 2 TABLETS (1.2 MG TOTAL) BY MOUTH DAILY. REPEAT 1 TABLET 2 HRS LATER IF NEEDED 12 tablet 2  . losartan-hydrochlorothiazide (HYZAAR) 100-25 MG tablet TAKE 1 TABLET BY MOUTH DAILY. 90 tablet 2  . Multiple Vitamin (MULTIVITAMIN) tablet Take 1 tablet by mouth daily. Reported on 05/04/2015    . sildenafil (REVATIO) 20 MG tablet Take 1 tablet (20 mg total) by mouth daily. One qd as needed 10 tablet 11  . Canagliflozin-Metformin HCl (INVOKAMET) [phone] MG TABS TAKE 1 TABLET BY MOUTH 2 (TWO) TIMES DAILY. 60 tablet 5   No facility-administered medications prior to visit.    ROS Review of Systems  Constitutional: Negative.  Negative for fever, chills, diaphoresis, appetite change and fatigue.  HENT: Negative.  Negative for facial swelling, sinus pressure, sore throat and trouble swallowing.   Eyes: Negative.  Negative for visual disturbance.  Respiratory: Positive for apnea. Negative for cough, choking, chest tightness, shortness of breath, wheezing  and stridor.   Cardiovascular: Negative.  Negative for chest pain, palpitations and leg swelling.  Gastrointestinal: Negative.  Negative for nausea, vomiting, abdominal pain, diarrhea and constipation.  Endocrine: Negative.  Negative for polydipsia, polyphagia and polyuria.  Genitourinary: Negative.  Negative for dysuria, urgency, hematuria, flank pain, discharge, penile swelling, scrotal swelling, difficulty urinating, genital sores, penile pain and testicular pain.  Musculoskeletal: Negative.  Negative for myalgias, back pain, joint swelling, arthralgias and neck pain.  Skin: Negative.  Negative for color change and rash.  Allergic/Immunologic: Negative.   Neurological: Negative.  Negative for dizziness, tremors, weakness, light-headedness, numbness and headaches.  Hematological: Negative.  Negative for adenopathy. Does not bruise/bleed easily.  Psychiatric/Behavioral: Negative.     Objective:  BP 120/70 mmHg  Pulse 75  Temp(Src) 98.3 F (36.8 C) (Oral)  Resp 16  Ht 5\' 10"  (1.778 m)  Wt 261 lb (118.389 kg)  BMI 37.45 kg/m2  SpO2 94%  BP Readings from Last 3 Encounters:  11/09/15 120/70  10/19/15 128/74  05/04/15 118/70    Wt Readings from Last 3 Encounters:  11/09/15 261 lb (118.389 kg)  10/19/15 262 lb (118.842 kg)  05/04/15 262 lb (118.842 kg)    Physical Exam  Constitutional: He is oriented to person, place, and time. He appears well-developed and well-nourished. No distress.  HENT:  Head: Normocephalic and atraumatic.  Mouth/Throat: Oropharynx is clear and moist. No oropharyngeal exudate.  Eyes: Right eye exhibits no discharge. Left eye exhibits no discharge. No scleral icterus.  Neck: Normal range of motion. Neck  supple. No JVD present. No tracheal deviation present. No thyromegaly present.  Cardiovascular: Normal rate, regular rhythm, normal heart sounds and intact distal pulses.  Exam reveals no gallop and no friction rub.   No murmur heard. Pulmonary/Chest:  Effort normal and breath sounds normal. No stridor. No respiratory distress. He has no wheezes. He has no rales. He exhibits no tenderness.  Abdominal: Soft. Bowel sounds are normal. He exhibits no distension and no mass. There is no tenderness. There is no rebound and no guarding. Hernia confirmed negative in the right inguinal area and confirmed negative in the left inguinal area.  Genitourinary: Rectum normal and testes normal. Rectal exam shows no external hemorrhoid, no internal hemorrhoid, no fissure, no mass, no tenderness and anal tone normal. Guaiac negative stool. Prostate is enlarged (2+ smooth symm BPH with bogginess) and tender. Right testis shows no mass, no swelling and no tenderness. Right testis is descended. Left testis shows no mass, no swelling and no tenderness. Left testis is descended. Uncircumcised. Penile erythema present. No phimosis, paraphimosis, hypospadias or penile tenderness. No discharge found.  There is diffuse erythema and mild exudate over the glans  Musculoskeletal: Normal range of motion. He exhibits no edema or tenderness.  Lymphadenopathy:    He has no cervical adenopathy.       Right: No inguinal adenopathy present.       Left: No inguinal adenopathy present.  Neurological: He is oriented to person, place, and time.  Skin: Skin is warm and dry. No rash noted. He is not diaphoretic. No erythema. No pallor.  Psychiatric: He has a normal mood and affect. His behavior is normal. Judgment and thought content normal.  Vitals reviewed.   Lab Results  Component Value Date   WBC 12.1* 11/09/2015   HGB 17.1* 11/09/2015   HCT 50.9 11/09/2015   PLT 256.0 11/09/2015   GLUCOSE 155* 10/19/2015   CHOL 152 10/19/2015   TRIG 361.0* 10/19/2015   HDL 41.40 10/19/2015   LDLDIRECT 85.0 10/19/2015   LDLCALC 72 12/24/2013   ALT 33 10/19/2015   AST 24 10/19/2015   NA 137 10/19/2015   K 4.2 10/19/2015   CL 99 10/19/2015   CREATININE 1.23 10/19/2015   BUN 26* 10/19/2015    CO2 29 10/19/2015   TSH 1.44 05/04/2015   PSA 2.87 10/19/2015   HGBA1C 7.0* 10/19/2015   MICROALBUR 11.0* 05/04/2015    Dg Chest 2 View  05/25/2014  CLINICAL DATA:  Dry cough for 3 weeks, low-grade fever, nasal drainage EXAM: CHEST  2 VIEW COMPARISON:  05/31/2011 FINDINGS: Cardiomediastinal silhouette is unremarkable. There is chronic elevation of the right hemidiaphragm. Degenerative changes mid thoracic spine. No acute infiltrate or pulmonary edema. IMPRESSION: No active cardiopulmonary disease. Chronic elevation of the right hemidiaphragm. Degenerative changes mid thoracic spine. Electronically Signed   By: Lahoma Crocker M.D.   On: 05/25/2014 14:00    Assessment & Plan:   Matthew Villanueva was seen today for hyperlipidemia, hypertension, diabetes and annual exam.  Diagnoses and all orders for this visit:  Type 2 diabetes mellitus with diabetic nephropathy, without long-term current use of insulin ([place])- his blood sugars are adequately well controlled on the combination of the metformin plus an SGLT2 inhibitor. -     Canagliflozin-Metformin HCl (INVOKAMET) [phone] MG TABS; TAKE 1 TABLET BY MOUTH 2 (TWO) TIMES DAILY.  Essential hypertension- His blood pressure is well-controlled, electrolytes and renal function are stable. -     Urinalysis, Routine w reflex microscopic (not at [facility]); Future  Routine health maintenance  Pure hyperglyceridemia- I will start an omega-3 fish oil to lower his triglyceride level to prevent complications like pancreatitis. -     Icosapent Ethyl (VASCEPA) 1 g CAPS; Take 2 capsules by mouth 2 (two) times daily.  Leukocytosis- his white cell count remains slightly elevated but all of his other cell lines are normal, I think this is probably reactive and will continue to monitor. At this time I do not see any evidence of lymphoproliferative disease. -     CBC with Differential/Platelet; Future  Candidal balanitis- I will treat this with topical ketoconazole, if this does  not resolve soon and he will have to stop taking the SGL T2 inhibitor -     ketoconazole (NIZORAL) 2 % cream; Apply 1 application topically daily.  Obesity, Class II, BMI 35-39.9- he agrees to decrease his caloric intake in an effort to help him lose weight, will also get his sleep apnea reevaluated.  OSA (obstructive sleep apnea) -     Ambulatory referral to Pulmonology   I am having Matthew Villanueva start on Icosapent Ethyl and ketoconazole. I am also having him maintain his aspirin, multivitamin, colchicine, losartan-hydrochlorothiazide, sildenafil, and Canagliflozin-Metformin HCl.  Meds ordered this encounter  Medications  . Canagliflozin-Metformin HCl (INVOKAMET) [phone] MG TABS    Sig: TAKE 1 TABLET BY MOUTH 2 (TWO) TIMES DAILY.    Dispense:  60 tablet    Refill:  5    CYCLE FILL MEDICATION. Authorization is required for next refill.  Vanessa Kick Ethyl (VASCEPA) 1 g CAPS    Sig: Take 2 capsules by mouth 2 (two) times daily.    Dispense:  120 capsule    Refill:  11  . ketoconazole (NIZORAL) 2 % cream    Sig: Apply 1 application topically daily.    Dispense:  60 g    Refill:  3   See AVS for instructions about healthy living and anticipatory guidance.  Follow-up: Return in about 4 months (around 03/10/2016).  Scarlette Calico, MD

## 2015-11-22 ENCOUNTER — Encounter (INDEPENDENT_AMBULATORY_CARE_PROVIDER_SITE_OTHER): Payer: Self-pay

## 2015-11-22 ENCOUNTER — Ambulatory Visit (INDEPENDENT_AMBULATORY_CARE_PROVIDER_SITE_OTHER): Payer: Medicare Other | Admitting: Pulmonary Disease

## 2015-11-22 VITALS — BP 124/68 | HR 84 | Ht 70.0 in | Wt 263.0 lb

## 2015-11-22 DIAGNOSIS — E669 Obesity, unspecified: Secondary | ICD-10-CM | POA: Diagnosis not present

## 2015-11-22 DIAGNOSIS — G4733 Obstructive sleep apnea (adult) (pediatric): Secondary | ICD-10-CM

## 2015-11-22 NOTE — Patient Instructions (Signed)
It was a pleasure taking care of you today!  We will schedule you to have a sleep study to determine if you have sleep apnea.   We will get a home sleep test.  You will be instructed to come back to the office to get an apparatus to sleep with overnight.  Once we have the apparatus, it will usually take [place] 1-2 weeks to read the study and get back at you with results of the test.  Please give [place] a call in 2 weeks after your study if you do not hear back from [place].    If the sleep study is positive, we will order you a CPAP  machine.  Please call the office if you do NOT receive your machine in the next 1-2 weeks.   Please make sure you use your CPAP device everytime you sleep.  We will monitor the usage of your machine per your insurance requirement.  Your insurance company may take the machine from you if you are not using it regularly.   Please clean the mask, tubings, filter, water reservoir with soapy water every week.  Please use distilled water for the water reservoir.   Please call the office or your machine provider (DME company) if you are having issues with the device.    Return to clinic in 6-8 weeks with Dr. Murlean Iba or NP or APP.

## 2015-11-22 NOTE — Assessment & Plan Note (Signed)
patient was diagnosed with severe OSA based on PSG done in 2002. AHI was 47. He had a rpt PAP study in 08/2010 which showed he was good on BiPaP 19/15.  He used cpap x 3 mos in 2002.  He was non compliant so DME got CPAP.  He could not tolerate cpap. He was claustrophobic.  He was non compliant again with 2014 cpap/bipap.  He tried an oral device but once his back teeth were pulled, oral device was deemed not working.  No follow up done.   Has hypersomnia.  Has witnessed apneas, gasping choking. Episodic.   For the most part, he feels unrefreshed when he wakes up in am.  Some nights are better than other nights.   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 He will be gone next Aug 9th-11th.    We extensively discussed the diagnosis, pathophysiology, and treatment options for Obstructive Sleep Apnea (OSA).  We discussed treatment options for OSA including CPAP, BiPaP, as well as surgical options and oral devices.   We will obtain a HST soonest.  Plan to start low (some Rx better than none) with cpap 5-10. Will need nasal mask or pillows or [place]. Gets very claustrophobic.  Needs frequent DL. Plan to uptitrate pressure later on.  Mouth device likely will not work.   Patient was instructed to call the office if he/she has not received the PAP device in 1-2 weeks.  Patient was instructed to have mask, tubings, filter, reservoir cleaned at least once a week with soapy water.  Patient was instructed to call the office if he/she is having issues with the PAP device.    I advised patient to obtain sufficient amount of sleep --  7 to 8 hours at least in a 24 hr period.  Patient was advised to follow good sleep hygiene.  Patient was advised NOT to engage in activities requiring concentration and/or vigilance if he/she is and  sleepy.  Patient is NOT to drive if he/she is sleepy.

## 2015-11-22 NOTE — Assessment & Plan Note (Signed)
Weight reduction 

## 2015-11-22 NOTE — Progress Notes (Signed)
Subjective:    Patient ID: Matthew Villanueva, male    DOB: October 17, 1935, 80 y.o.   MRN: [id number]  HPI   This is the case of Matthew Villanueva, 80 y.o. Male, who was referred by Dr. Scarlette Calico n consultation regarding OSA.   As you very well know, patient was diagnosed with severe OSA based on PSG done in 2002. AHI was 47. He had a rpt PAP study in 08/2010 which showed he was good on BiPaP 19/15.  He used cpap x 3 mos in 2002.  He was non compliant so DME got CPAP.  He could not tolerate cpap. He was claustrophobic.  He tried an oral device but once his back teeth were pulled, oral device was deemed not working.  No follow up done.   Has hypersomnia.  Has witnessed apneas, gasping choking. Episodic.   For the most part, he feels unrefreshed when he wakes up in am.  Some nights are better than other nights.   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 He will be gone next Aug 9th-11th.   40PY smoking, quit in 1986. Has not been dxed with asthma or copd.        Review of Systems  Constitutional: Negative for fever and unexpected weight change.  HENT: Negative for congestion, dental problem, ear pain, nosebleeds, postnasal drip, rhinorrhea, sinus pressure, sneezing, sore throat and trouble swallowing.   Eyes: Negative for redness and itching.  Respiratory: Negative for cough, chest tightness, shortness of breath and wheezing.   Cardiovascular: Negative for palpitations and leg swelling.  Gastrointestinal: Negative for nausea and vomiting.  Genitourinary: Negative for dysuria.  Musculoskeletal: Negative for joint swelling.  Skin: Negative for rash.  Neurological: Negative for headaches.  Hematological: Bruises/bleeds easily.  Psychiatric/Behavioral: Negative for dysphoric mood. The patient is not nervous/anxious.    Past Medical History  Diagnosis Date  . Right bundle branch block   . Other specified cardiac dysrhythmias(427.89) 2004   ABLATION DUE TO TACHYCARDIA  . HTN (hypertension)   . Arthritis   . Dyspnea   . Diabetes mellitus     NO INSULIN  . Localized osteoarthrosis not specified whether primary or secondary, hand   . Gout   . History of colonic polyps   . OSA (obstructive sleep apnea)   (-) CA, DVT   Family History  Problem Relation Age of Onset  . Cancer Father     throat and tongue  . Diabetes Maternal Uncle   . Heart attack Maternal Grandmother   . Heart disease Maternal Grandmother     CAD/MI  . Heart disease Maternal Grandfather   . Heart disease Mother   . Heart disease Maternal Aunt     CAD  . Kidney cancer Sister      Past Surgical History  Procedure Laterality Date  . Hemorrhoid surgery  1960  . Pilonidal cyst drainage    . Cardiac electrophysiology study and ablation  2004    ablation for SVT with a chronic right bundle branch block     Social History   Social History  . Marital Status: Single    Spouse Name: N/A  . Number of Children: N/A  . Years of Education: 16   Occupational History  . self employed, Recruitment consultant   .     Social History Main Topics  . Smoking status: Former Smoker -- 2.00 packs/day for 30 years    Types: Cigarettes  Quit date: 05/15/1984  . Smokeless tobacco: Never Used  . Alcohol Use: No     Comment: rare use  . Drug Use: No  . Sexual Activity: Not Currently   Other Topics Concern  . Not on file   Social History Narrative   HS[employer], [place]   Single   Work: self employed...Recruitment consultant   I-ADLs   End-of-Life: Yes-CPR, Yes-short-term Mechanical Ventilation, but no prolonged ventilation. Is willing to undergo dialysis, prolonged tub feeding.   Pt states former smoker. 1986. 2ppd. Started age 30     Allergies  Allergen Reactions  . Livalo [Pitavastatin] Other (See Comments)    Muscle aches  . Crestor [Rosuvastatin]     Muscle aches  . Pravastatin      Outpatient Prescriptions Prior to Visit  Medication Sig Dispense Refill    . aspirin 81 MG tablet Take 81 mg by mouth daily.      . Canagliflozin-Metformin HCl (INVOKAMET) [id number] MG TABS TAKE 1 TABLET BY MOUTH 2 (TWO) TIMES DAILY. 60 tablet 5  . colchicine (COLCRYS) 0.6 MG tablet TAKE 2 TABLETS (1.2 MG TOTAL) BY MOUTH DAILY. REPEAT 1 TABLET 2 HRS LATER IF NEEDED 12 tablet 2  . Icosapent Ethyl (VASCEPA) 1 g CAPS Take 2 capsules by mouth 2 (two) times daily. 120 capsule 11  . ketoconazole (NIZORAL) 2 % cream Apply 1 application topically daily. 60 g 3  . losartan-hydrochlorothiazide (HYZAAR) 100-25 MG tablet TAKE 1 TABLET BY MOUTH DAILY. 90 tablet 2  . Multiple Vitamin (MULTIVITAMIN) tablet Take 1 tablet by mouth daily. Reported on 05/04/2015    . sildenafil (REVATIO) 20 MG tablet Take 1 tablet (20 mg total) by mouth daily. One qd as needed 10 tablet 11   No facility-administered medications prior to visit.   No orders of the defined types were placed in this encounter.           Objective:   Physical Exam   Vitals:  Filed Vitals:   11/22/15 1329  BP: 124/68  Pulse: 84  Height: 5\' 10"  (1.778 m)  Weight: 263 lb (119.296 kg)  SpO2: 91%    Constitutional/General:  Pleasant, well-nourished, well-developed, not in any distress,  Comfortably seating.  Well kempt  Body mass index is 37.74 kg/(m^2). Wt Readings from Last 3 Encounters:  11/22/15 263 lb (119.296 kg)  11/09/15 261 lb (118.389 kg)  10/19/15 262 lb (118.842 kg)    Neck circumference:   HEENT: Pupils equal and reactive to light and accommodation. Anicteric sclerae. Normal nasal mucosa.   No oral  lesions,  mouth clear,  oropharynx clear, no postnasal drip. (-) Oral thrush. No dental caries.  Airway - Mallampati class III  Neck: No masses. Midline trachea. No JVD, (-) LAD. (-) bruits appreciated.  Respiratory/Chest: Grossly normal chest. (-) deformity. (-) Accessory muscle use.  Symmetric expansion. (-) Tenderness on palpation.  Resonant on percussion.  Diminished BS on both lower  lung zones. (-) wheezing, crackles, rhonchi (-) egophony  Cardiovascular: Regular rate and  rhythm, heart sounds normal, no murmur or gallops, no peripheral edema  Gastrointestinal:  Normal bowel sounds. Soft, non-tender. No hepatosplenomegaly.  (-) masses.   Musculoskeletal:  Normal muscle tone. Normal gait.   Extremities: Grossly normal. (-) clubbing, cyanosis.  (-) edema  Skin: (-) rash,lesions seen.   Neurological/Psychiatric : alert, oriented to time, place, person. Normal mood and affect           Assessment & Plan:  OSA (obstructive sleep apnea) patient  was diagnosed with severe OSA based on PSG done in 2002. AHI was 47. He had a rpt PAP study in 08/2010 which showed he was good on BiPaP 19/15.  He used cpap x 3 mos in 2002.  He was non compliant so DME got CPAP.  He could not tolerate cpap. He was claustrophobic.  He was non compliant again with 2014 cpap/bipap.  He tried an oral device but once his back teeth were pulled, oral device was deemed not working.  No follow up done.   Has hypersomnia.  Has witnessed apneas, gasping choking. Episodic.   For the most part, he feels unrefreshed when he wakes up in am.  Some nights are better than other nights.   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 He will be gone next Aug 9th-11th.    We extensively discussed the diagnosis, pathophysiology, and treatment options for Obstructive Sleep Apnea (OSA).  We discussed treatment options for OSA including CPAP, BiPaP, as well as surgical options and oral devices.   We will obtain a HST soonest.  Plan to start low (some Rx better than none) with cpap 5-10. Will need nasal mask or pillows or Wisp. Gets very claustrophobic.  Needs frequent DL. Plan to uptitrate pressure later on.  Mouth device likely will not work.   Patient was instructed to call the office if he/she has not received the PAP device in 1-2 weeks.  Patient was instructed to have mask,  tubings, filter, reservoir cleaned at least once a week with soapy water.  Patient was instructed to call the office if he/she is having issues with the PAP device.    I advised patient to obtain sufficient amount of sleep --  7 to 8 hours at least in a 24 hr period.  Patient was advised to follow good sleep hygiene.  Patient was advised NOT to engage in activities requiring concentration and/or vigilance if he/she is and  sleepy.  Patient is NOT to drive if he/she is sleepy.      Obesity Weight reduction.      Thank you very much for letting me participate in this patient's care. Please do not hesitate to give me a call if you have any questions or concerns regarding the treatment plan.   Patient will follow up with me in in 6-8 weeks.     Monica Becton, MD 11/22/2015   2:17 PM Pulmonary and [place] Pager: [phone] Office: [phone], Fax: [phone]

## 2015-11-23 DIAGNOSIS — G4733 Obstructive sleep apnea (adult) (pediatric): Secondary | ICD-10-CM | POA: Diagnosis not present

## 2015-11-25 ENCOUNTER — Telehealth: Payer: Self-pay | Admitting: Pulmonary Disease

## 2015-11-25 DIAGNOSIS — G4733 Obstructive sleep apnea (adult) (pediatric): Secondary | ICD-10-CM

## 2015-11-25 NOTE — Telephone Encounter (Signed)
    Please call the pt and tell the pt the [place]  showed OSA.   Pt stops breathing  26  times an hour.   Home sleep study was done on : November 23, 2015  Please order autoCPAP 5-10 cm H2O. Patient will need a mask fitting session. He prefers nasal mask or pillows or the Wisp if possible. Patient will need a 1 month download.   Patient needs to be seen by me or any of the NPs/APPs  4-6 weeks after obtaining the cpap machine. Let me know if you receive this.   Thanks!   J. Shirl Harris, MD 11/25/2015, 11:00 AM

## 2015-11-25 NOTE — Telephone Encounter (Signed)
LMTCB x 1 

## 2015-11-29 DIAGNOSIS — G4733 Obstructive sleep apnea (adult) (pediatric): Secondary | ICD-10-CM | POA: Diagnosis not present

## 2015-11-30 NOTE — Addendum Note (Signed)
Addended by: Beckie Busing on: 11/30/2015 02:57 PM   Modules accepted: Orders

## 2015-11-30 NOTE — Telephone Encounter (Signed)
Spoke with pt and gave results and recommendations. Pt agrees to start CPAP therapy. Order placed. Pt aware to call office and schedule f/u appt once starts CPAP. Nothing further needed.  

## 2015-12-01 ENCOUNTER — Other Ambulatory Visit: Payer: Self-pay | Admitting: *Deleted

## 2015-12-01 DIAGNOSIS — G4733 Obstructive sleep apnea (adult) (pediatric): Secondary | ICD-10-CM

## 2015-12-28 DIAGNOSIS — G4733 Obstructive sleep apnea (adult) (pediatric): Secondary | ICD-10-CM | POA: Diagnosis not present

## 2016-01-18 ENCOUNTER — Ambulatory Visit: Payer: Medicare Other | Admitting: Pulmonary Disease

## 2016-01-28 DIAGNOSIS — G4733 Obstructive sleep apnea (adult) (pediatric): Secondary | ICD-10-CM | POA: Diagnosis not present

## 2016-01-29 ENCOUNTER — Other Ambulatory Visit: Payer: Self-pay | Admitting: Internal Medicine

## 2016-01-29 DIAGNOSIS — E1121 Type 2 diabetes mellitus with diabetic nephropathy: Secondary | ICD-10-CM

## 2016-02-15 ENCOUNTER — Encounter: Payer: Self-pay | Admitting: Pulmonary Disease

## 2016-02-25 ENCOUNTER — Telehealth: Payer: Self-pay | Admitting: Cardiovascular Disease

## 2016-02-25 NOTE — Telephone Encounter (Signed)
Pt c/o Shortness Of Breath: STAT if SOB developed within the last 24 hours or pt is noticeably SOB on the phone  1. Are you currently SOB (can you hear that pt is SOB on the phone)? no  2. How long have you been experiencing SOB?caouple weeks   3. Are you SOB when sitting or when up moving around? Moving around   4. Are you currently experiencing any other symptoms? no

## 2016-02-25 NOTE — Telephone Encounter (Signed)
LMTCB

## 2016-02-27 DIAGNOSIS — G4733 Obstructive sleep apnea (adult) (pediatric): Secondary | ICD-10-CM | POA: Diagnosis not present

## 2016-02-28 NOTE — Telephone Encounter (Signed)
Spoke to patient.  c/o shortness of breath with activity. No chest discomfort at present time or with the shortness of breath No swelling noted  No congestion. Recommended patient contact primary first to rule out other options , if primary thinks cardiac will make an appointment. Last visit was in 2015. Patient verbalized understanding.

## 2016-03-06 ENCOUNTER — Encounter: Payer: Self-pay | Admitting: Internal Medicine

## 2016-03-06 ENCOUNTER — Emergency Department (HOSPITAL_COMMUNITY)
Admission: EM | Admit: 2016-03-06 | Discharge: 2016-03-06 | Disposition: A | Discharge status: Home / Self Care | Payer: Medicare Other | Attending: Emergency Medicine | Admitting: Emergency Medicine

## 2016-03-06 ENCOUNTER — Emergency Department (HOSPITAL_COMMUNITY): Payer: Medicare Other

## 2016-03-06 ENCOUNTER — Encounter (HOSPITAL_COMMUNITY): Payer: Self-pay | Admitting: Emergency Medicine

## 2016-03-06 ENCOUNTER — Ambulatory Visit (INDEPENDENT_AMBULATORY_CARE_PROVIDER_SITE_OTHER): Payer: Medicare Other | Admitting: Internal Medicine

## 2016-03-06 ENCOUNTER — Encounter: Payer: Self-pay | Admitting: Pulmonary Disease

## 2016-03-06 VITALS — BP 128/80 | HR 67 | Temp 98.3°F | Resp 16 | Ht 70.0 in | Wt 263.0 lb

## 2016-03-06 DIAGNOSIS — R200 Anesthesia of skin: Secondary | ICD-10-CM | POA: Diagnosis not present

## 2016-03-06 DIAGNOSIS — R0609 Other forms of dyspnea: Secondary | ICD-10-CM

## 2016-03-06 DIAGNOSIS — Z23 Encounter for immunization: Secondary | ICD-10-CM | POA: Diagnosis not present

## 2016-03-06 DIAGNOSIS — I10 Essential (primary) hypertension: Secondary | ICD-10-CM

## 2016-03-06 DIAGNOSIS — Z87891 Personal history of nicotine dependence: Secondary | ICD-10-CM | POA: Insufficient documentation

## 2016-03-06 DIAGNOSIS — E1121 Type 2 diabetes mellitus with diabetic nephropathy: Secondary | ICD-10-CM

## 2016-03-06 DIAGNOSIS — E669 Obesity, unspecified: Secondary | ICD-10-CM | POA: Diagnosis not present

## 2016-03-06 DIAGNOSIS — D72829 Elevated white blood cell count, unspecified: Secondary | ICD-10-CM

## 2016-03-06 DIAGNOSIS — Z79899 Other long term (current) drug therapy: Secondary | ICD-10-CM | POA: Insufficient documentation

## 2016-03-06 DIAGNOSIS — Z7982 Long term (current) use of aspirin: Secondary | ICD-10-CM | POA: Diagnosis not present

## 2016-03-06 DIAGNOSIS — E119 Type 2 diabetes mellitus without complications: Secondary | ICD-10-CM | POA: Insufficient documentation

## 2016-03-06 DIAGNOSIS — R0600 Dyspnea, unspecified: Secondary | ICD-10-CM

## 2016-03-06 DIAGNOSIS — R0602 Shortness of breath: Secondary | ICD-10-CM | POA: Diagnosis not present

## 2016-03-06 LAB — BRAIN NATRIURETIC PEPTIDE: B NATRIURETIC PEPTIDE 5: 52.3 pg/mL (ref 0.0–100.0)

## 2016-03-06 LAB — CBC
HEMATOCRIT: 50.9 % (ref 39.0–52.0)
Hemoglobin: 17.4 g/dL — ABNORMAL HIGH (ref 13.0–17.0)
MCH: 29.7 pg (ref 26.0–34.0)
MCHC: 34.2 g/dL (ref 30.0–36.0)
MCV: 87 fL (ref 78.0–100.0)
Platelets: 212 10*3/uL (ref 150–400)
RBC: 5.85 MIL/uL — ABNORMAL HIGH (ref 4.22–5.81)
RDW: 14.6 % (ref 11.5–15.5)
WBC: 10.4 10*3/uL (ref 4.0–10.5)

## 2016-03-06 LAB — BASIC METABOLIC PANEL
Anion gap: 10 (ref 5–15)
BUN: 15 mg/dL (ref 6–20)
CHLORIDE: 104 mmol/L (ref 101–111)
CO2: 24 mmol/L (ref 22–32)
Calcium: 9.9 mg/dL (ref 8.9–10.3)
Creatinine, Ser: 1.07 mg/dL (ref 0.61–1.24)
GFR calc Af Amer: >60 mL/min (ref >60)
GFR calc non Af Amer: >60 mL/min (ref >60)
GLUCOSE: 119 mg/dL — AB (ref 65–99)
POTASSIUM: 4.5 mmol/L (ref 3.5–5.1)
Sodium: 138 mmol/L (ref 135–145)

## 2016-03-06 LAB — I-STAT TROPONIN, ED: Troponin i, poc: 0.01 ng/mL (ref 0.00–0.08)

## 2016-03-06 NOTE — ED Provider Notes (Signed)
The patient is a 80 year old male, he has a history of having several different stress test in the past, he states they were normal including 2008 and 2014 however over the last month parallel in the time that he has been working for [employer] and feeling stressed out he has been having dyspnea on exertion, an example was trying to walk up the driveway last night with his garbage can where he states that he became short of breath. He states this is less exertion than usual to get short of breath. He denies chest pain. He denies swelling of the legs. He is not having any symptoms at this time. He was sent to the ER because his doctor did not have the capability to do an EKG. The patient states he was not to be admitted unless we found something abnormal. On my exam the patient has clear lung sounds, clear heart sounds, frequent ectopy, no edema, well-appearing, no symptoms. EKG reviewed showing right bundle branch block with frequent ectopy, labs reviewed showing negative troponin. We'll discuss with cardiology and arrange for appropriate disposition to have further outpatient evaluation. The patient's primary cardiologist is Dr. Johnsie Cancel   EKG Interpretation  Date/Time:  Monday March 06 2016 12:38:06 EDT Ventricular Rate:  76 PR Interval:  174 QRS Duration: 140 QT Interval:  390 QTC Calculation: 438 R Axis:   4 Text Interpretation:  Sinus rhythm with frequent Premature ventricular complexes Right bundle branch block Abnormal ECG since 2004, now has RBBB appearance with frequent Ectopy Confirmed by Burgess Sheriff  MD, Kaamil Morefield ([id number]) on 03/06/2016 1:30:04 PM      Medical screening examination/treatment/procedure(s) were conducted as a shared visit with non-physician practitioner(s) and myself.  I personally evaluated the patient during the encounter.  Clinical Impression:   Final diagnoses:  Dyspnea on exertion         [name], MD 03/07/16 2044

## 2016-03-06 NOTE — Progress Notes (Signed)
Subjective:  Patient ID: Matthew Villanueva, male    DOB: [birth year]  Age: 80 y.o. MRN: [id number]  CC: Shortness of Breath   HPI Matthew Villanueva presents for concerns about a 2 week history of dyspnea on exertion. He states he gets short of breath with light activity. He is status post cardioversion in [place] several years ago for atrial fibrillation. He has had no cardiac follow-up for about a year now. He denies any recent episodes of chest pain, palpitations, diaphoresis, syncope or near syncope, or nausea. He does have a mild nonproductive cough and some peripheral edema.  He also complains of chronic numbness in both feet that has worsened over the last 1.5 years. He does not complain of pain, stinging, or burning in his feet or toes.  Outpatient Medications Prior to Visit  Medication Sig Dispense Refill  . aspirin 81 MG tablet Take 81 mg by mouth daily.      . colchicine (COLCRYS) 0.6 MG tablet TAKE 2 TABLETS (1.2 MG TOTAL) BY MOUTH DAILY. REPEAT 1 TABLET 2 HRS LATER IF NEEDED 12 tablet 2  . INVOKAMET [phone] MG TABS TAKE 1 TABLET BY MOUTH 2 (TWO) TIMES DAILY. 60 tablet 5  . ketoconazole (NIZORAL) 2 % cream Apply 1 application topically daily. 60 g 3  . losartan-hydrochlorothiazide (HYZAAR) 100-25 MG tablet TAKE 1 TABLET BY MOUTH DAILY. 90 tablet 2  . Multiple Vitamin (MULTIVITAMIN) tablet Take 1 tablet by mouth daily. Reported on 05/04/2015    . sildenafil (REVATIO) 20 MG tablet Take 1 tablet (20 mg total) by mouth daily. One qd as needed 10 tablet 11  . Icosapent Ethyl (VASCEPA) 1 g CAPS Take 2 capsules by mouth 2 (two) times daily. (Patient not taking: Reported on 03/06/2016) 120 capsule 11   No facility-administered medications prior to visit.     ROS Review of Systems  Constitutional: Positive for activity change. Negative for appetite change, diaphoresis, fatigue, fever and unexpected weight change.  HENT: Negative.   Eyes: Negative.  Negative for visual  disturbance.  Respiratory: Positive for cough and shortness of breath. Negative for apnea, choking, chest tightness, wheezing and stridor.   Cardiovascular: Positive for leg swelling. Negative for chest pain and palpitations.  Gastrointestinal: Negative.  Negative for abdominal pain, constipation, diarrhea, nausea and vomiting.  Endocrine: Negative.  Negative for polydipsia, polyphagia and polyuria.  Genitourinary: Negative.  Negative for difficulty urinating.  Musculoskeletal: Negative.  Negative for arthralgias, back pain and myalgias.  Skin: Negative.  Negative for color change and rash.  Allergic/Immunologic: Negative.   Neurological: Negative.  Negative for dizziness, weakness and light-headedness.  Hematological: Negative.  Negative for adenopathy. Does not bruise/bleed easily.  Psychiatric/Behavioral: Negative.     Objective:  BP 128/80 (BP Location: Left Arm, Patient Position: Sitting, Cuff Size: Large)   Pulse 67   Temp 98.3 F (36.8 C) (Oral)   Resp 16   Ht 5\' 10"  (1.778 m)   Wt 263 lb (119.3 kg)   SpO2 93%   BMI 37.74 kg/m   BP Readings from Last 3 Encounters:  03/06/16 128/80  11/22/15 124/68  11/09/15 120/70    Wt Readings from Last 3 Encounters:  03/06/16 263 lb (119.3 kg)  11/22/15 263 lb (119.3 kg)  11/09/15 261 lb (118.4 kg)    Physical Exam  Constitutional: He is oriented to person, place, and time. No distress.  HENT:  Mouth/Throat: Oropharynx is clear and moist. No oropharyngeal exudate.  Eyes: Conjunctivae are normal. Right eye exhibits  no discharge. Left eye exhibits no discharge. No scleral icterus.  Neck: Normal range of motion. Neck supple. No JVD present. No tracheal deviation present. No thyromegaly present.  Cardiovascular: Normal rate, regular rhythm, normal heart sounds and intact distal pulses.  Exam reveals no gallop and no friction rub.   No murmur heard. Pulmonary/Chest: Effort normal and breath sounds normal. No stridor. No respiratory  distress. He has no wheezes. He has no rales. He exhibits no tenderness.  Abdominal: Bowel sounds are normal. He exhibits no distension and no mass. There is no tenderness. There is no rebound and no guarding.  Musculoskeletal: He exhibits edema. He exhibits no tenderness or deformity.  Trace pitting edema in both lower extremities  Lymphadenopathy:    He has no cervical adenopathy.  Neurological: He is oriented to person, place, and time.  Skin: Skin is warm and dry. No rash noted. He is not diaphoretic. No erythema. No pallor.  Vitals reviewed.   Lab Results  Component Value Date   WBC 12.1 (H) 11/09/2015   HGB 17.1 (H) 11/09/2015   HCT 50.9 11/09/2015   PLT 256.0 11/09/2015   GLUCOSE 155 (H) 10/19/2015   CHOL 152 10/19/2015   TRIG 361.0 (H) 10/19/2015   HDL 41.40 10/19/2015   LDLDIRECT 85.0 10/19/2015   LDLCALC 72 12/24/2013   ALT 33 10/19/2015   AST 24 10/19/2015   NA 137 10/19/2015   K 4.2 10/19/2015   CL 99 10/19/2015   CREATININE 1.23 10/19/2015   BUN 26 (H) 10/19/2015   CO2 29 10/19/2015   TSH 1.44 05/04/2015   PSA 2.87 10/19/2015   HGBA1C 7.0 (H) 10/19/2015   MICROALBUR 11.0 (H) 05/04/2015    Dg Chest 2 View  Result Date: 05/25/2014 CLINICAL DATA:  Dry cough for 3 weeks, low-grade fever, nasal drainage EXAM: CHEST  2 VIEW COMPARISON:  05/31/2011 FINDINGS: Cardiomediastinal silhouette is unremarkable. There is chronic elevation of the right hemidiaphragm. Degenerative changes mid thoracic spine. No acute infiltrate or pulmonary edema. IMPRESSION: No active cardiopulmonary disease. Chronic elevation of the right hemidiaphragm. Degenerative changes mid thoracic spine. Electronically Signed   By: Lahoma Crocker M.D.   On: 05/25/2014 14:00    Assessment & Plan:   Matthew Villanueva was seen today for shortness of breath.  Diagnoses and all orders for this visit:  Numbness of feet- this may be related to diabetes, I will check a B12 level to screen for B12 deficiency and since he  has a history of elevated white cell count will also look at his CBC again see if there is some concern for development of lymphoproliferative disease to be causing a peripheral neuropathy. I will also check his electrolytes and renal function today. -     CBC with Differential/Platelet; Future -     Comprehensive metabolic panel; Future -     Folate; Future -     Vitamin B12; Future  Leukocytosis, unspecified type- I will recheck his CBC today and if his white cell count remains elevated and there is concern for lymphoproliferative disease then I may refer to hematology for further evaluation.  Essential hypertension- his blood pressure is adequately well controlled. -     Urinalysis, Routine w reflex microscopic (not at [facility]); Future  Obesity, Class II, BMI 35-39.9- he agrees to work on his lifestyle modifications to lose weight.  Type 2 diabetes mellitus with diabetic nephropathy, without long-term current use of insulin ([place])- I will monitor his blood sugar control today with an A1c and  will also monitor his renal function and screen for diabetic nephropathy -     Hemoglobin A1c; Future -     Microalbumin / creatinine urine ratio; Future  DOE (dyspnea on exertion)- this is a high-risk patient with a history of diabetes as well as atrial fibrillation who has new onset and worsening DOE, her EKG machine is not working in the office today so [facility] E[facility] performed in to be screened for heart failure and cardiac ischemia.   I am having Matthew Villanueva maintain his aspirin, multivitamin, colchicine, losartan-hydrochlorothiazide, sildenafil, Icosapent Ethyl, ketoconazole, and INVOKAMET.  No orders of the defined types were placed in this encounter.    Follow-up: No Follow-up on file.  Scarlette Calico, MD

## 2016-03-06 NOTE — Progress Notes (Signed)
Pre visit review using our clinic review tool, if applicable. No additional management support is needed unless otherwise documented below in the visit note. 

## 2016-03-06 NOTE — ED Notes (Signed)
Patient going to xray prior to being roomed.

## 2016-03-06 NOTE — ED Triage Notes (Signed)
Sob  On exertion x a couple of weeks and was sent here for ekg by his pcp , no cp

## 2016-03-06 NOTE — Patient Instructions (Signed)

## 2016-03-06 NOTE — ED Notes (Signed)
Patient is 96% on RA. Denies chest pain. SOB with exertion. Comfortable on RA at rest. Cardiac hx in family. Prior smoker. Sister passed from lung CA.

## 2016-03-06 NOTE — Addendum Note (Signed)
Addended by: Aviva Signs M on: 03/06/2016 12:59 PM   Modules accepted: Orders

## 2016-03-06 NOTE — Discharge Instructions (Signed)
You have an appointment at the [facility] on Wednesday at 8:30 AM with the cardiology Physician Assistant Ms. Grandville Silos. Please keep this scheduled appointment. Return to ER for new or worsening symptoms, any additional concerns.

## 2016-03-06 NOTE — Progress Notes (Signed)
Cardiology Office Note    Date:  03/08/2016   ID:  Kaled, Ai 03-30-1936, MRN [id number]  PCP:  Scarlette Calico, MD  Cardiologist:  Dr. Johnsie Cancel   CC: SOB  History of Present Illness:  Matthew Villanueva is a 80 y.o. male with a history of SVT s/p ablation (in [place]), HTN, RBBB, obesity, OSA on CPAP, DMT2 who presents to clinic for evaluation of shortness of breath.   He has no history of CAD but normal myoview in 2008 and ETT/Myoview in 2014 that was low risk .   Last seen by Dr. Johnsie Cancel in 03/2013 and doing okay. He was seen at his PCP's office on 03/06/16 for DOE. The ECG machine was not working so he was sent to the ER. ECG showed a new RBBB and PVCs. inpatient work up was recommended but he refused admission. BNP and troponin were normal.    Per PCP noted he had a DCCV for atrial fibrillation in [place] a couple years ago. Patient denies a history of DCCV or cardioversion but he doesn't really remember.  Today he presents to clinic for follow up. Over the last couple weeks he has noticed that he has been feeling SOB. He has been busy rushing around with the furniture market. He notices it most when rushing to go somewhere. No chest pain. No LE edema, orthopnea, or PND. No subjective palpitations. He is concerned because his friend recently passed away and his only symptom was SOB. No recent fevers or chills. Occasional night sweats that have been going on for years with no change. No unexplained weight loss. No formal exercise. The most exertion he gets is walking around at work. 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    Past Medical History:  Diagnosis Date  . Arthritis   . Diabetes mellitus    NO INSULIN  . Dyspnea   . Gout   . History of colonic polyps   . HTN (hypertension)   . Localized osteoarthrosis not specified whether primary or secondary, hand   . OSA (obstructive sleep apnea)   . Other specified cardiac dysrhythmias(427.89) 2004   ABLATION DUE TO TACHYCARDIA  . Right bundle branch block     Past Surgical History:  Procedure Laterality Date  . CARDIAC ELECTROPHYSIOLOGY STUDY AND ABLATION  2004   ablation for SVT with a chronic right bundle branch block   . [place]  . PILONIDAL CYST DRAINAGE      Current Medications: Outpatient Medications Prior to Visit  Medication Sig Dispense Refill  . aspirin 81 MG tablet Take 81 mg by mouth daily.      . INVOKAMET [id number] MG TABS TAKE 1 TABLET BY MOUTH 2 (TWO) TIMES DAILY. 60 tablet 5  . losartan-hydrochlorothiazide (HYZAAR) 100-25 MG tablet TAKE 1 TABLET BY MOUTH DAILY. 90 tablet 2  . Multiple Vitamin (MULTIVITAMIN) tablet Take 1 tablet by mouth daily. Reported on 05/04/2015    . Omega-3 Fatty Acids (FISH OIL) 1000 MG CPDR Take 1,000 mg by mouth daily.    . sildenafil (REVATIO) 20 MG tablet Take 1 tablet (20 mg total) by mouth daily. One qd as needed 10 tablet 11  . colchicine 0.6 MG tablet as directed. TAKE 2 TABLETS (1.2 MG TOTAL) BY MOUTH DAILY. REPEAT 1 TABLET 2 HRS LATER IF NEEDED    . Icosapent Ethyl (VASCEPA) 1 g CAPS Take 2 capsules by mouth 2 (two) times daily. (Patient not taking: Reported on 03/08/2016)  120 capsule 11  . ketoconazole (NIZORAL) 2 % cream Apply 1 application topically daily. (Patient not taking: Reported on 03/08/2016) 60 g 3   No facility-administered medications prior to visit.      Allergies:   Livalo [pitavastatin]; Crestor [rosuvastatin]; and Pravastatin   Social History   Social History  . Marital status: Single    Spouse name: N/A  . Number of children: N/A  . Years of education: 80   Occupational History  . self employed, Recruitment consultant   .  Self-Employed   Social History Main Topics  . Smoking status: Former Smoker    Packs/day: 2.00    Years: 30.00    Types: Cigarettes    Quit date: 05/15/1984  . Smokeless tobacco: Never Used  . Alcohol use No     Comment: rare use  . Drug use: No  . Sexual activity: Not  Currently   Other Topics Concern  . None   Social History Narrative   HSG, [place]   Single   Work: self employed...Recruitment consultant   I-ADLs   End-of-Life: Yes-CPR, Yes-short-term Mechanical Ventilation, but no prolonged ventilation. Is willing to undergo dialysis, prolonged tub feeding.   Pt states former smoker. 1986. 2ppd. Started age 27     Family History:  The patient's family history includes Cancer in his father; Diabetes in his maternal uncle; Heart attack in his maternal grandmother; Heart disease in his maternal aunt, maternal grandfather, maternal grandmother, and mother; Kidney cancer in his sister.     ROS:   Please see the history of present illness.    ROS All other systems reviewed and are negative.   PHYSICAL EXAM:   VS:  BP 122/76   Pulse (!) 47   Ht 5\' 10"  (1.778 m)   Wt 260 lb 12.8 oz (118.3 kg)   BMI 37.42 kg/m    GEN: Well nourished, well developed, in no acute distress  HEENT: normal  Neck: no JVD, carotid bruits, or masses Cardiac: RRR; no murmurs, rubs, or gallops,no edema  Respiratory:  clear to auscultation bilaterally, normal work of breathing GI: soft, nontender, nondistended, + BS MS: no deformity or atrophy  Skin: warm and dry, no rash Neuro:  Alert and Oriented x 3, Strength and sensation are intact Psych: euthymic mood, full affect  Wt Readings from Last 3 Encounters:  03/08/16 260 lb 12.8 oz (118.3 kg)  03/06/16 263 lb (119.3 kg)  11/22/15 263 lb (119.3 kg)      Studies/Labs Reviewed:   EKG:  EKG is NOT ordered today. ECG done in ER Monday showed RBBB and PVCs  Recent Labs: 05/04/2015: TSH 1.44 10/19/2015: ALT 33 03/06/2016: B Natriuretic Peptide 52.3; BUN 15; Creatinine, Ser 1.07; Hemoglobin 17.4; Platelets 212; Potassium 4.5; Sodium 138   Lipid Panel    Component Value Date/Time   CHOL 152 10/19/2015 1547   TRIG 361.0 (H) 10/19/2015 1547   HDL 41.40 10/19/2015 1547   CHOLHDL 4 10/19/2015 1547   VLDL 72.2 (H)  10/19/2015 1547   LDLCALC 72 12/24/2013 1141   LDLDIRECT 85.0 10/19/2015 1547    Additional studies/ records that were reviewed today include:  ETT/Myoview 04/2013 Conclusions PACs develop during recovery only. No evidence of exercise induced ischemia. Hypertensive response to exercise , otherwise normal stress test.   ASSESSMENT & PLAN:   SOB: will update 2D ECHO and myovew.   PVCs: will do echo to assess cardiac structure and function.   PAF s/p DCCV in [place]: I  do not see any diagnosis of afib in his chart. All ECGs show sinus. I wonder if this could have been an episode of SVT. If he does have afib, CHADSVASC is at least 4 (HTN, age, DM) and he should really bee on [place]. He denies a history of this. We will continue to monitor at this point  HTN: BP well controlled  RBBB: this has been stable over time.   SVT s/p ablation: no recent palpitations .  DMT2: most recent HgA1c 7.0. Goal <6.5  OSA: continue CPAP  Medication Adjustments/Labs and Tests Ordered: Current medicines are reviewed at length with the patient today.  Concerns regarding medicines are outlined above.  Medication changes, Labs and Tests ordered today are listed in the Patient Instructions below. Patient Instructions  Medication Instructions:  Your physician recommends that you continue on your current medications as directed. Please refer to the Current Medication list given to you today.   Labwork: None ordered  Testing/Procedures: Your physician has requested that you have an echocardiogram. Echocardiography is a painless test that uses sound waves to create images of your heart. It provides your doctor with information about the size and shape of your heart and how well your heart's chambers and valves are working. This procedure takes approximately one hour. There are no restrictions for this procedure.  Your physician has requested that you have a lexiscan myoview. For further information please  visit [email or web address]. Please follow instruction sheet, as given.    Follow-Up: Your physician wants you to follow-up in: [place] DR. Gaspar Bidding will receive a reminder letter in the mail two months in advance. If you don't receive a letter, please call our office to schedule the follow-up appointment.    Any Other Special Instructions Will Be Listed Below (If Applicable). Echocardiogram An echocardiogram, or echocardiography, uses sound waves (ultrasound) to produce an image of your heart. The echocardiogram is simple, painless, obtained within a short period of time, and offers valuable information to your health care provider. The images from an echocardiogram can provide information such as:  Evidence of coronary artery disease (CAD).  Heart size.  Heart muscle function.  Heart valve function.  Aneurysm detection.  Evidence of a past heart attack.  Fluid buildup around the heart.  Heart muscle thickening.  Assess heart valve function. LET [facility] CARE PROVIDER KNOW ABOUT:  Any allergies you have.  All medicines you are taking, including vitamins, herbs, eye drops, creams, and over-the-counter medicines.  Previous problems you or members of your family have had with the use of anesthetics.  Any blood disorders you have.  Previous surgeries you have had.  Medical conditions you have.  Possibility of pregnancy, if this applies. BEFORE THE PROCEDURE  No special preparation is needed. Eat and drink normally.  PROCEDURE   In order to produce an image of your heart, gel will be applied to your chest and a wand-like tool (transducer) will be moved over your chest. The gel will help transmit the sound waves from the transducer. The sound waves will harmlessly bounce off your heart to allow the heart images to be captured in real-time motion. These images will then be recorded.  You may need an IV to receive a medicine that improves the quality of the  pictures. AFTER THE PROCEDURE You may return to your normal schedule including diet, activities, and medicines, unless your health care provider tells you otherwise.   This information is not intended to replace advice given  to you by your health care provider. Make sure you discuss any questions you have with your health care provider.   Document Released: 04/28/2000 Document Revised: 05/22/2014 Document Reviewed: 01/06/2013 Elsevier Interactive Patient Education 2016 [place].   Pharmacologic Stress Electrocardiogram A pharmacologic stress electrocardiogram is a heart (cardiac) test that uses nuclear imaging to evaluate the blood supply to your heart. This test may also be called a pharmacologic stress electrocardiography. Pharmacologic means that a medicine is used to increase your heart rate and blood pressure.  This stress test is done to find areas of poor blood flow to the heart by determining the extent of coronary artery disease (CAD). Some people exercise on a treadmill, which naturally increases the blood flow to the heart. For those people unable to exercise on a treadmill, a medicine is used. This medicine stimulates your heart and will cause your heart to beat harder and more quickly, as if you were exercising.  Pharmacologic stress tests can help determine:  The adequacy of blood flow to your heart during increased levels of activity in order to clear you for discharge home.  The extent of coronary artery blockage caused by CAD.  Your prognosis if you have suffered a heart attack.  The effectiveness of cardiac procedures done, such as an angioplasty, which can increase the circulation in your coronary arteries.  Causes of chest pain or pressure. LET [facility] CARE PROVIDER KNOW ABOUT:  Any allergies you have.  All medicines you are taking, including vitamins, herbs, eye drops, creams, and over-the-counter medicines.  Previous problems you or members of your family  have had with the use of anesthetics.  Any blood disorders you have.  Previous surgeries you have had.  Medical conditions you have.  Possibility of pregnancy, if this applies.  If you are currently breastfeeding. RISKS AND COMPLICATIONS Generally, this is a safe procedure. However, as with any procedure, complications can occur. Possible complications include:  You develop pain or pressure in the following areas:  Chest.  Jaw or neck.  Between your shoulder blades.  Radiating down your left arm.  Headache.  Dizziness or light-headedness.  Shortness of breath.  Increased or irregular heartbeat.  Low blood pressure.  Nausea or vomiting.  Flushing.  Redness going up the arm and slight pain during injection of medicine.  Heart attack (rare). BEFORE THE PROCEDURE   Avoid all forms of caffeine for 24 hours before your test or as directed by your health care provider. This includes coffee, tea (even decaffeinated tea), caffeinated sodas, chocolate, cocoa, and certain pain medicines.  Follow your health care provider's instructions regarding eating and drinking before the test.  Take your medicines as directed at regular times with water unless instructed otherwise. Exceptions may include:  If you have diabetes, ask how you are to take your insulin or pills. It is common to adjust insulin dosing the morning of the test.  If you are taking beta-blocker medicines, it is important to talk to your health care provider about these medicines well before the date of your test. Taking beta-blocker medicines may interfere with the test. In some cases, these medicines need to be changed or stopped 24 hours or more before the test.  If you wear a nitroglycerin patch, it may need to be removed prior to the test. Ask your health care provider if the patch should be removed before the test.  If you use an inhaler for any breathing condition, bring it with you to the test.  If you  are an outpatient, bring a snack so you can eat right after the stress phase of the test.  Do not smoke for 4 hours prior to the test or as directed by your health care provider.  Do not apply lotions, powders, creams, or oils on your chest prior to the test.  Wear comfortable shoes and clothing. Let your health care provider know if you were unable to complete or follow the preparations for your test. PROCEDURE   Multiple patches (electrodes) will be put on your chest. If needed, small areas of your chest may be shaved to get better contact with the electrodes. Once the electrodes are attached to your body, multiple wires will be attached to the electrodes, and your heart rate will be monitored.  An IV access will be started. A nuclear trace (isotope) is given. The isotope may be given intravenously, or it may be swallowed. Nuclear refers to several types of radioactive isotopes, and the nuclear isotope lights up the arteries so that the nuclear images are clear. The isotope is absorbed by your body. This results in low radiation exposure.  A resting nuclear image is taken to show how your heart functions at rest.  A medicine is given through the IV access.  A second scan is done about 1 hour after the medicine injection and determines how your heart functions under stress.  During this stress phase, you will be connected to an electrocardiogram machine. Your blood pressure and oxygen levels will be monitored. AFTER THE PROCEDURE   Your heart rate and blood pressure will be monitored after the test.  You may return to your normal schedule, including diet,activities, and medicines, unless your health care provider tells you otherwise.   This information is not intended to replace advice given to you by your health care provider. Make sure you discuss any questions you have with your health care provider.   Document Released: 09/17/2008 Document Revised: 05/06/2013 Document Reviewed:  01/06/2013 Elsevier Interactive Patient Education [employer].   If you need a refill on your cardiac medications before your next appointment, please call your pharmacy.      Signed, Angelena Form, PA-C  03/08/2016 9:08 AM    [facility] [place], [place], [place]  [address] Phone: [phone]; Fax: [phone]

## 2016-03-06 NOTE — ED Provider Notes (Signed)
[facility] DEPT Provider Note   CSN: [id number] Arrival date & time: 03/06/16  1226     History   Chief Complaint Chief Complaint  Patient presents with  . Shortness of Breath    HPI Matthew Villanueva is a 80 y.o. male.  The history is provided by the patient and medical records. No language interpreter was used.   Matthew Villanueva is a 80 y.o. male  with a PMH of HTN, DM, sleep apnea who presents to the Emergency Department complaining of progressively worsening dyspnea on exertion x 2 weeks. Patient states he initially was having shortness of breath with significant exertion, now he is having shortness of breath with light activity. He denies chest pain, palpitations, diaphoresis, nausea, vomiting, syncopal episode, fevers or congestion. He endorses a chronic nonproductive cough, but no change recently. Patient states that he had an ablation for "tachycardia" several years ago. No other known heart disease. He went to his primary care physician this morning. Per chart review, the EKG machine at his PCP was not working properly. His PCP sent him to the ER today to have an EKG performed and to be screened for heart failure and cardiac ischemia.   Past Medical History:  Diagnosis Date  . Arthritis   . Diabetes mellitus    NO INSULIN  . Dyspnea   . Gout   . History of colonic polyps   . HTN (hypertension)   . Localized osteoarthrosis not specified whether primary or secondary, hand   . OSA (obstructive sleep apnea)   . Other specified cardiac dysrhythmias(427.89) 2004   ABLATION DUE TO TACHYCARDIA  . Right bundle branch block     Patient Active Problem List   Diagnosis Date Noted  . Pure hyperglyceridemia 11/09/2015  . Leukocytosis 11/09/2015  . OSA (obstructive sleep apnea) 11/09/2015  . Candidal balanitis 11/09/2015  . Statin intolerance 06/10/2014  . Numbness of feet 12/24/2013  . Other male erectile dysfunction 12/24/2013  . 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  .  Hyperlipidemia with target LDL less than 100 08/12/2013  . BPH associated with nocturia 08/12/2013  . History of TIAs 06/06/2012  . Obesity, Class II, BMI 35-39.9 05/24/2011  . Routine health maintenance 05/24/2011  . HEARING LOSS 04/28/2009  . DOE (dyspnea on exertion) 04/28/2009  . DM (diabetes mellitus), type 2 with renal complications ([place]) [id number]  . DJD of shoulder 07/23/2007  . Gout 06/25/2007  . Essential hypertension 06/25/2007    Past Surgical History:  Procedure Laterality Date  . CARDIAC ELECTROPHYSIOLOGY STUDY AND ABLATION  2004   ablation for SVT with a chronic right bundle branch block   . [place]  . PILONIDAL CYST DRAINAGE         Home Medications    Prior to Admission medications   Medication Sig Start Date End Date Taking? Authorizing Provider  aspirin 81 MG tablet Take 81 mg by mouth daily.     Yes Historical Provider, MD  colchicine (COLCRYS) 0.6 MG tablet TAKE 2 TABLETS (1.2 MG TOTAL) BY MOUTH DAILY. REPEAT 1 TABLET 2 HRS LATER IF NEEDED 05/04/15  Yes [name], MD  INVOKAMET [phone] MG TABS TAKE 1 TABLET BY MOUTH 2 (TWO) TIMES DAILY. 01/31/16  Yes [name], MD  ketoconazole (NIZORAL) 2 % cream Apply 1 application topically daily. 11/09/15  Yes [name], MD  losartan-hydrochlorothiazide (HYZAAR) 100-25 MG tablet TAKE 1 TABLET BY MOUTH DAILY. 06/17/15  Yes [name], MD  Multiple Vitamin (MULTIVITAMIN)  tablet Take 1 tablet by mouth daily. Reported on 05/04/2015   Yes Historical Provider, MD  Omega-3 Fatty Acids (FISH OIL) 1000 MG CPDR Take 1,000 mg by mouth daily.   Yes Historical Provider, MD  sildenafil (REVATIO) 20 MG tablet Take 1 tablet (20 mg total) by mouth daily. One qd as needed 10/19/15  Yes [name], MD  Icosapent Ethyl (VASCEPA) 1 g CAPS Take 2 capsules by mouth 2 (two) times daily. Patient not taking: Reported on 03/06/2016 11/09/15   [name], MD    Family History Family History  Problem  Relation Age of Onset  . Cancer Father     throat and tongue  . Heart disease Mother   . Kidney cancer Sister   . Diabetes Maternal Uncle   . Heart attack Maternal Grandmother   . Heart disease Maternal Grandmother     CAD/MI  . Heart disease Maternal Grandfather   . Heart disease Maternal Aunt     CAD    Social History Social History  Substance Use Topics  . Smoking status: Former Smoker    Packs/day: 2.00    Years: 30.00    Types: Cigarettes    Quit date: 05/15/1984  . Smokeless tobacco: Never Used  . Alcohol use No     Comment: rare use     Allergies   Livalo [pitavastatin]; Crestor [rosuvastatin]; and Pravastatin   Review of Systems Review of Systems  Constitutional: Negative for fever.  HENT: Negative for congestion.   Eyes: Negative for visual disturbance.  Respiratory: Positive for shortness of breath. Negative for cough, chest tightness and wheezing.   Cardiovascular: Negative.   Gastrointestinal: Negative for abdominal pain, nausea and vomiting.  Genitourinary: Negative for dysuria.  Musculoskeletal: Negative for back pain and neck pain.  Skin: Negative for rash.  Neurological: Negative for headaches.     Physical Exam Updated Vital Signs BP (!) 131/49   Pulse 68   Temp 98.3 F (36.8 C) (Oral)   Resp 18   SpO2 95%   Physical Exam  Constitutional: He is oriented to person, place, and time. He appears well-developed and well-nourished. No distress.  HENT:  Head: Normocephalic and atraumatic.  Cardiovascular: Normal rate, regular rhythm and normal heart sounds.   Pulmonary/Chest: Effort normal and breath sounds normal. No respiratory distress. He has no wheezes. He has no rales. He exhibits no tenderness.  Abdominal: Soft. He exhibits no distension. There is no tenderness.  Musculoskeletal: He exhibits no edema.  Neurological: He is alert and oriented to person, place, and time.  Skin: Skin is warm and dry.  Nursing note and vitals  reviewed.    ED Treatments / Results  Labs (all labs ordered are listed, but only abnormal results are displayed) Labs Reviewed  BASIC METABOLIC PANEL - Abnormal; Notable for the following:       Result Value   Glucose, Bld 119 (*)    All other components within normal limits  CBC - Abnormal; Notable for the following:    RBC 5.85 (*)    Hemoglobin 17.4 (*)    All other components within normal limits  BRAIN NATRIURETIC PEPTIDE  I-STAT TROPOININ, ED    EKG  EKG Interpretation  Date/Time:  Monday March 06 2016 12:38:06 EDT Ventricular Rate:  76 PR Interval:  174 QRS Duration: 140 QT Interval:  390 QTC Calculation: 438 R Axis:   4 Text Interpretation:  Sinus rhythm with frequent Premature ventricular complexes Right bundle branch block Abnormal  ECG since 2004, now has RBBB appearance with frequent Ectopy Confirmed by Sabra Heck  MD, [place] ([id number]) on 03/06/2016 1:30:04 PM       Radiology Dg Chest 2 View  Result Date: 03/06/2016 CLINICAL DATA:  Shortness of breath. EXAM: CHEST  2 VIEW COMPARISON:  05/25/2014.  05/31/2011. FINDINGS: Mediastinum hilar structures normal. Mild left base pleural parenchymal thickening consistent scarring again noted. No acute infiltrate. No pleural effusion or pneumothorax . IMPRESSION: Mild left base pleural parenchymal thickening consistent with mild scarring again noted. No acute cardiopulmonary disease. Electronically Signed   By: Marcello Moores  Register   On: 03/06/2016 13:52    Procedures Procedures (including critical care time)  Medications Ordered in ED Medications - No data to display   Initial Impression / Assessment and Plan / ED Course  I have reviewed the triage vital signs and the nursing notes.  Pertinent labs & imaging results that were available during my care of the patient were reviewed by me and considered in my medical decision making (see chart for details).  Clinical Course  Value Comment By Time  RBC: (!) 5.85 (Reviewed)  [name], MD 10/23 Leeper is a 80 y.o. male who presents to ED from PCP for dyspnea on exertion. No chest pain. No LE swelling. CXR reassuring. Troponin negative. BNP wdl. CBC and BMP reviewed and reassuring as well. He saw his PCP this morning and unfortunately EKG machine was not working, therefore sent over for EKG which was performed showing RBBB with frequent ectopy. On exam, patient is very well appearing with normal lung sounds and no edema. Heart with frequent ectopy. Reassuring exam. Offered admission for further cardiac workup however patient declines. Discussed case with cardiology who has scheduled patient with an appointment in two days (Wednesday 10/25 at 8:30am). I believe this is appropriate disposition given reassuring findings today. Reasons to return to ED were discussed and all questions answered.   Patient seen by and discussed with Dr. Sabra Heck who agrees with treatment plan.    Final Clinical Impressions(s) / ED Diagnoses   Final diagnoses:  Dyspnea on exertion    New Prescriptions New Prescriptions   No medications on file     [facility] Kendricks Reap, PA-C 03/06/16 1608    [name], MD 03/07/16 2044

## 2016-03-07 ENCOUNTER — Encounter: Payer: Self-pay | Admitting: Physician Assistant

## 2016-03-08 ENCOUNTER — Ambulatory Visit (INDEPENDENT_AMBULATORY_CARE_PROVIDER_SITE_OTHER): Payer: Medicare Other | Admitting: Pulmonary Disease

## 2016-03-08 ENCOUNTER — Ambulatory Visit (INDEPENDENT_AMBULATORY_CARE_PROVIDER_SITE_OTHER): Payer: Medicare Other | Admitting: Physician Assistant

## 2016-03-08 ENCOUNTER — Encounter: Payer: Self-pay | Admitting: Pulmonary Disease

## 2016-03-08 ENCOUNTER — Encounter: Payer: Self-pay | Admitting: Physician Assistant

## 2016-03-08 ENCOUNTER — Telehealth: Payer: Self-pay | Admitting: Pulmonary Disease

## 2016-03-08 ENCOUNTER — Other Ambulatory Visit (INDEPENDENT_AMBULATORY_CARE_PROVIDER_SITE_OTHER): Payer: Medicare Other

## 2016-03-08 VITALS — BP 122/76 | HR 47 | Ht 70.0 in | Wt 260.8 lb

## 2016-03-08 VITALS — BP 110/70 | HR 72 | Ht 70.0 in | Wt 263.8 lb

## 2016-03-08 DIAGNOSIS — R0602 Shortness of breath: Secondary | ICD-10-CM

## 2016-03-08 DIAGNOSIS — I493 Ventricular premature depolarization: Secondary | ICD-10-CM

## 2016-03-08 DIAGNOSIS — I10 Essential (primary) hypertension: Secondary | ICD-10-CM

## 2016-03-08 DIAGNOSIS — I471 Supraventricular tachycardia, unspecified: Secondary | ICD-10-CM

## 2016-03-08 DIAGNOSIS — E1121 Type 2 diabetes mellitus with diabetic nephropathy: Secondary | ICD-10-CM | POA: Diagnosis not present

## 2016-03-08 DIAGNOSIS — G4733 Obstructive sleep apnea (adult) (pediatric): Secondary | ICD-10-CM | POA: Diagnosis not present

## 2016-03-08 DIAGNOSIS — E785 Hyperlipidemia, unspecified: Secondary | ICD-10-CM | POA: Diagnosis not present

## 2016-03-08 DIAGNOSIS — R0609 Other forms of dyspnea: Secondary | ICD-10-CM | POA: Diagnosis not present

## 2016-03-08 DIAGNOSIS — Z9989 Dependence on other enabling machines and devices: Secondary | ICD-10-CM

## 2016-03-08 DIAGNOSIS — R200 Anesthesia of skin: Secondary | ICD-10-CM | POA: Diagnosis not present

## 2016-03-08 DIAGNOSIS — R0600 Dyspnea, unspecified: Secondary | ICD-10-CM | POA: Insufficient documentation

## 2016-03-08 DIAGNOSIS — E118 Type 2 diabetes mellitus with unspecified complications: Secondary | ICD-10-CM

## 2016-03-08 DIAGNOSIS — I4510 Unspecified right bundle-branch block: Secondary | ICD-10-CM

## 2016-03-08 LAB — COMPREHENSIVE METABOLIC PANEL
ALBUMIN: 4.2 g/dL (ref 3.5–5.2)
ALK PHOS: 15 U/L — AB (ref 39–117)
ALT: 31 U/L (ref 0–53)
AST: 22 U/L (ref 0–37)
BUN: 19 mg/dL (ref 6–23)
CALCIUM: 9.7 mg/dL (ref 8.4–10.5)
CHLORIDE: 101 meq/L (ref 96–112)
CO2: 25 mEq/L (ref 19–32)
Creatinine, Ser: 1.14 mg/dL (ref 0.40–1.50)
GFR: 66.03 mL/min (ref >60.00)
Glucose, Bld: 139 mg/dL — ABNORMAL HIGH (ref 70–99)
POTASSIUM: 4.1 meq/L (ref 3.5–5.1)
Sodium: 138 mEq/L (ref 135–145)
Total Bilirubin: 0.7 mg/dL (ref 0.2–1.2)
Total Protein: 7.3 g/dL (ref 6.0–8.3)

## 2016-03-08 LAB — CBC WITH DIFFERENTIAL/PLATELET
BASOS PCT: 0.4 % (ref 0.0–3.0)
Basophils Absolute: 0 10*3/uL (ref 0.0–0.1)
EOS PCT: 1.5 % (ref 0.0–5.0)
Eosinophils Absolute: 0.2 10*3/uL (ref 0.0–0.7)
HEMATOCRIT: 50.2 % (ref 39.0–52.0)
HEMOGLOBIN: 17.1 g/dL — AB (ref 13.0–17.0)
LYMPHS PCT: 17 % (ref 12.0–46.0)
Lymphs Abs: 1.7 10*3/uL (ref 0.7–4.0)
MCHC: 34.1 g/dL (ref 30.0–36.0)
MCV: 86.3 fl (ref 78.0–100.0)
MONO ABS: 0.9 10*3/uL (ref 0.1–1.0)
MONOS PCT: 8.6 % (ref 3.0–12.0)
Neutro Abs: 7.2 10*3/uL (ref 1.4–7.7)
Neutrophils Relative %: 72.5 % (ref 43.0–77.0)
Platelets: 227 10*3/uL (ref 150.0–400.0)
RBC: 5.82 Mil/uL — ABNORMAL HIGH (ref 4.22–5.81)
RDW: 15.1 % (ref 11.5–15.5)
WBC: 9.9 10*3/uL (ref 4.0–10.5)

## 2016-03-08 LAB — URINALYSIS, ROUTINE W REFLEX MICROSCOPIC
BILIRUBIN URINE: NEGATIVE
Hgb urine dipstick: NEGATIVE
Ketones, ur: NEGATIVE
LEUKOCYTES UA: NEGATIVE
Nitrite: NEGATIVE
PH: 5.5 (ref 5.0–8.0)
SPECIFIC GRAVITY, URINE: 1.015 (ref 1.000–1.030)
Urine Glucose: >=1000 — AB
Urobilinogen, UA: 0.2 (ref 0.0–1.0)

## 2016-03-08 LAB — MICROALBUMIN / CREATININE URINE RATIO
CREATININE, U: 118.8 mg/dL
MICROALB UR: 10.5 mg/dL — AB (ref 0.0–1.9)
Microalb Creat Ratio: 8.8 mg/g (ref 0.0–30.0)

## 2016-03-08 LAB — HEMOGLOBIN A1C: Hgb A1c MFr Bld: 7 % — ABNORMAL HIGH (ref 4.6–6.5)

## 2016-03-08 LAB — VITAMIN B12: Vitamin B-12: 307 pg/mL (ref 211–911)

## 2016-03-08 LAB — FOLATE: FOLATE: 17.2 ng/mL (ref >5.9)

## 2016-03-08 NOTE — Telephone Encounter (Signed)
Pt was seen in ED on 10/23 and had several labs drawn.  Pt is requesting results of these labs.    AD please advise on labs.  Thanks!

## 2016-03-08 NOTE — Patient Instructions (Signed)
  It was a pleasure taking care of you today!  Continue using your CPAP machine.   Please make sure you use your CPAP device everytime you sleep.  We will monitor the usage of your machine per your insurance requirement.  Your insurance company may take the machine from you if you are not using it regularly.   Please clean the mask, tubings, filter, water reservoir with soapy water every week.  Please use distilled water for the water reservoir.   Please call the office or your machine provider (DME company) if you are having issues with the device.   We will schedule you for a chest CT scan as well as a breathing test.  Return to clinic in 6 months.

## 2016-03-08 NOTE — Telephone Encounter (Signed)
You can send results off EPIC. Thanks.  Monica Becton, MD 03/08/2016, 5:14 PM [facility] Pager [phone] After 3 pm or if no answer, call [phone]

## 2016-03-08 NOTE — Assessment & Plan Note (Signed)
Recent exertional dyspnea.  Patient known to have tachycardia several years ago for which she had an ablation. He is scheduled to see cardiology and scheduled to have a stress test next week. Nonsmoker. With some allergic rhinitis and sinusitis. Patient also drives to [facility] as part of this chart. CXR (02/2016) : No acute changes. Elevated right hemidiaphragm. Chest x-ray similar to a chest x-ray done in 2013.  Plan : 1. Follow-up with cardiology. He scheduled to have a stress test next week. 2. Certainly, with his frequent long trips to [place], he is at risk for pulmonary embolism. We extensively discussed this. His creatinine was normal 2 days ago. He wants to pursue a chest CTA to determine whether he has a blood clot. He understands the risks and possible complications of contrast given the fact that he has diabetes. I discussed with him regarding hydration and avoiding nephrotoxins the next 24 hours. 3. Plan for PFT. May have underlying asthma. 4. We'll hold off on meds pending test results.

## 2016-03-08 NOTE — Progress Notes (Signed)
Subjective:    Patient ID: Matthew Villanueva, male    DOB: [birth year], 80 y.o.   MRN: [id number]  HPI   This is the case of Matthew Villanueva, 80 y.o. Male, who was referred by Dr. Scarlette Calico n consultation regarding OSA.   As you very well know, patient was diagnosed with severe OSA based on PSG done in 2002. AHI was 47. He had a rpt PAP study in 08/2010 which showed he was good on BiPaP 19/15.  He used cpap x 3 mos in 2002.  He was non compliant so DME got CPAP.  He could not tolerate cpap. He was claustrophobic.  He tried an oral device but once his back teeth were pulled, oral device was deemed not working.  No follow up done.   Has hypersomnia.  Has witnessed apneas, gasping choking. Episodic.   For the most part, he feels unrefreshed when he wakes up in am.  Some nights are better than other nights.   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 He will be gone next Aug 9th-11th.   40PY smoking, quit in 1986. Has not been dxed with asthma or copd.   ROV 03/08/16 Patient returns to the office as follow-up on his sleep apnea. Since last seen, he had a home sleep test which showed AHI 26 in July 2017. He was started on auto CPAP. He feels better using it. More energy. Less sleepiness. Download the last month: 97%, AHI 3. He is an auto 5-10 centimeters water. No issues with cpap.  Pt with recent exertional dyspnea. Was at the ER and work up was (-) so far. He is scheduled to have a stress test next week.  He does a lot of driving to [place], at least monthly.      Review of Systems  Constitutional: Negative for fever and unexpected weight change.  HENT: Negative for congestion, dental problem, ear pain, nosebleeds, postnasal drip, rhinorrhea, sinus pressure, sneezing, sore throat and trouble swallowing.   Eyes: Negative for redness and itching.  Respiratory: Negative for cough, chest tightness, shortness of breath and wheezing.   Cardiovascular:  Negative for palpitations and leg swelling.  Gastrointestinal: Negative for nausea and vomiting.  Genitourinary: Negative for dysuria.  Musculoskeletal: Negative for joint swelling.  Skin: Negative for rash.  Neurological: Negative for headaches.  Hematological: Bruises/bleeds easily.  Psychiatric/Behavioral: Negative for dysphoric mood. The patient is not nervous/anxious.           Objective:   Physical Exam   Vitals:  Vitals:   03/08/16 1215  BP: 110/70  Pulse: 72  SpO2: 96%  Weight: 263 lb 12.8 oz (119.7 kg)  Height: 5\' 10"  (1.778 m)    Constitutional/General:  Pleasant, well-nourished, well-developed, not in any distress,  Comfortably seating.  Well kempt  Body mass index is 37.85 kg/m. Wt Readings from Last 3 Encounters:  03/08/16 263 lb 12.8 oz (119.7 kg)  03/08/16 260 lb 12.8 oz (118.3 kg)  03/06/16 263 lb (119.3 kg)     HEENT: Pupils equal and reactive to light and accommodation. Anicteric sclerae. Normal nasal mucosa.   No oral  lesions,  mouth clear,  oropharynx clear, no postnasal drip. (-) Oral thrush. No dental caries.  Airway - Mallampati class IV  Neck: No masses. Midline trachea. No JVD, (-) LAD. (-) bruits appreciated.  Respiratory/Chest: Grossly normal chest. (-) deformity. (-) Accessory muscle use.  Symmetric expansion. (-) Tenderness on palpation.  Resonant on  percussion.  Diminished BS on both lower lung zones. (-) wheezing, crackles, rhonchi (-) egophony  Cardiovascular: Regular rate and  rhythm, heart sounds normal, no murmur or gallops, no peripheral edema  Gastrointestinal:  Normal bowel sounds. Soft, non-tender. No hepatosplenomegaly.  (-) masses.   Musculoskeletal:  Normal muscle tone. Normal gait.   Extremities: Grossly normal. (-) clubbing, cyanosis.  (-) edema  Skin: (-) rash,lesions seen.   Neurological/Psychiatric : alert, oriented to time, place, person. Normal mood and affect           Assessment & Plan:    OSA (obstructive sleep apnea) Patient was diagnosed with severe OSA based on PSG done in 2002. AHI was 47. He had a rpt PAP study in 08/2010 which showed he was good on BiPaP 19/15.  He used cpap x 3 mos in 2002.  He was non compliant so DME got CPAP.  He could not tolerate cpap. He was claustrophobic.  He was non compliant again with 2014 cpap/bipap.  He tried an oral device but once his back teeth were pulled, oral device was deemed not working.  No follow up done.   Pt with recent worsening of hypersomnia.  Home sleep test (July 2017): AHI 26. On auto CPAP 5-10 centimeters water.  Feels better using CPAP. More energy. Less sleepiness. Download the last month: 97%, AHI 3.  Plan : We extensively discussed the importance of treating OSA and the need to use PAP therapy.   Continue with autocpap 5-10 cm water. Has nasal pillows. Feels better using cpap. More energy. Less sleepiness.    Patient was instructed to have mask, tubings, filter, reservoir cleaned at least once a week with soapy water.  Patient was instructed to call the office if he/she is having issues with the PAP device.    I advised patient to obtain sufficient amount of sleep --  7 to 8 hours at least in a 24 hr period.  Patient was advised to follow good sleep hygiene.  Patient was advised NOT to engage in activities requiring concentration and/or vigilance if he/she is and  sleepy.  Patient is NOT to drive if he/she is sleepy.    Exertional dyspnea Recent exertional dyspnea.  Patient known to have tachycardia several years ago for which she had an ablation. He is scheduled to see cardiology and scheduled to have a stress test next week. Nonsmoker. With some allergic rhinitis and sinusitis. Patient also drives to [facility] as part of this chart. CXR (02/2016) : No acute changes. Elevated right hemidiaphragm. Chest x-ray similar to a chest x-ray done in 2013.  Plan : 1. Follow-up with cardiology. He scheduled to  have a stress test next week. 2. Certainly, with his frequent long trips to [place], he is at risk for pulmonary embolism. We extensively discussed this. His creatinine was normal 2 days ago. He wants to pursue a chest CTA to determine whether he has a blood clot. He understands the risks and possible complications of contrast given the fact that he has diabetes. I discussed with him regarding hydration and avoiding nephrotoxins the next 24 hours. 3. Plan for PFT. May have underlying asthma. 4. We'll hold off on meds pending test results.   Morbid obesity ([place]) Weight reduction      Patient will follow up with me in in 77-months unless test results are (+).     Monica Becton, MD 03/08/2016   12:48 PM [facility] Pager: [phone] Office: [phone], Fax: [phone]

## 2016-03-08 NOTE — Patient Instructions (Addendum)
Medication Instructions:  Your physician recommends that you continue on your current medications as directed. Please refer to the Current Medication list given to you today.   Labwork: None ordered  Testing/Procedures: Your physician has requested that you have an echocardiogram. Echocardiography is a painless test that uses sound waves to create images of your heart. It provides your doctor with information about the size and shape of your heart and how well your heart's chambers and valves are working. This procedure takes approximately one hour. There are no restrictions for this procedure.  Your physician has requested that you have a lexiscan myoview. For further information please visit [email or web address]. Please follow instruction sheet, as given.    Follow-Up: Your physician wants you to follow-up in: [place] DR. Gaspar Bidding will receive a reminder letter in the mail two months in advance. If you don't receive a letter, please call our office to schedule the follow-up appointment.    Any Other Special Instructions Will Be Listed Below (If Applicable). Echocardiogram An echocardiogram, or echocardiography, uses sound waves (ultrasound) to produce an image of your heart. The echocardiogram is simple, painless, obtained within a short period of time, and offers valuable information to your health care provider. The images from an echocardiogram can provide information such as:  Evidence of coronary artery disease (CAD).  Heart size.  Heart muscle function.  Heart valve function.  Aneurysm detection.  Evidence of a past heart attack.  Fluid buildup around the heart.  Heart muscle thickening.  Assess heart valve function. LET [facility] CARE PROVIDER KNOW ABOUT:  Any allergies you have.  All medicines you are taking, including vitamins, herbs, eye drops, creams, and over-the-counter medicines.  Previous problems you or members of your family have had with the  use of anesthetics.  Any blood disorders you have.  Previous surgeries you have had.  Medical conditions you have.  Possibility of pregnancy, if this applies. BEFORE THE PROCEDURE  No special preparation is needed. Eat and drink normally.  PROCEDURE   In order to produce an image of your heart, gel will be applied to your chest and a wand-like tool (transducer) will be moved over your chest. The gel will help transmit the sound waves from the transducer. The sound waves will harmlessly bounce off your heart to allow the heart images to be captured in real-time motion. These images will then be recorded.  You may need an IV to receive a medicine that improves the quality of the pictures. AFTER THE PROCEDURE You may return to your normal schedule including diet, activities, and medicines, unless your health care provider tells you otherwise.   This information is not intended to replace advice given to you by your health care provider. Make sure you discuss any questions you have with your health care provider.   Document Released: 04/28/2000 Document Revised: 05/22/2014 Document Reviewed: 01/06/2013 Elsevier Interactive Patient Education 2016 [place].   Pharmacologic Stress Electrocardiogram A pharmacologic stress electrocardiogram is a heart (cardiac) test that uses nuclear imaging to evaluate the blood supply to your heart. This test may also be called a pharmacologic stress electrocardiography. Pharmacologic means that a medicine is used to increase your heart rate and blood pressure.  This stress test is done to find areas of poor blood flow to the heart by determining the extent of coronary artery disease (CAD). Some people exercise on a treadmill, which naturally increases the blood flow to the heart. For those people unable to exercise  on a treadmill, a medicine is used. This medicine stimulates your heart and will cause your heart to beat harder and more quickly, as if you  were exercising.  Pharmacologic stress tests can help determine:  The adequacy of blood flow to your heart during increased levels of activity in order to clear you for discharge home.  The extent of coronary artery blockage caused by CAD.  Your prognosis if you have suffered a heart attack.  The effectiveness of cardiac procedures done, such as an angioplasty, which can increase the circulation in your coronary arteries.  Causes of chest pain or pressure. LET [facility] CARE PROVIDER KNOW ABOUT:  Any allergies you have.  All medicines you are taking, including vitamins, herbs, eye drops, creams, and over-the-counter medicines.  Previous problems you or members of your family have had with the use of anesthetics.  Any blood disorders you have.  Previous surgeries you have had.  Medical conditions you have.  Possibility of pregnancy, if this applies.  If you are currently breastfeeding. RISKS AND COMPLICATIONS Generally, this is a safe procedure. However, as with any procedure, complications can occur. Possible complications include:  You develop pain or pressure in the following areas:  Chest.  Jaw or neck.  Between your shoulder blades.  Radiating down your left arm.  Headache.  Dizziness or light-headedness.  Shortness of breath.  Increased or irregular heartbeat.  Low blood pressure.  Nausea or vomiting.  Flushing.  Redness going up the arm and slight pain during injection of medicine.  Heart attack (rare). BEFORE THE PROCEDURE   Avoid all forms of caffeine for 24 hours before your test or as directed by your health care provider. This includes coffee, tea (even decaffeinated tea), caffeinated sodas, chocolate, cocoa, and certain pain medicines.  Follow your health care provider's instructions regarding eating and drinking before the test.  Take your medicines as directed at regular times with water unless instructed otherwise. Exceptions may  include:  If you have diabetes, ask how you are to take your insulin or pills. It is common to adjust insulin dosing the morning of the test.  If you are taking beta-blocker medicines, it is important to talk to your health care provider about these medicines well before the date of your test. Taking beta-blocker medicines may interfere with the test. In some cases, these medicines need to be changed or stopped 24 hours or more before the test.  If you wear a nitroglycerin patch, it may need to be removed prior to the test. Ask your health care provider if the patch should be removed before the test.  If you use an inhaler for any breathing condition, bring it with you to the test.  If you are an outpatient, bring a snack so you can eat right after the stress phase of the test.  Do not smoke for 4 hours prior to the test or as directed by your health care provider.  Do not apply lotions, powders, creams, or oils on your chest prior to the test.  Wear comfortable shoes and clothing. Let your health care provider know if you were unable to complete or follow the preparations for your test. PROCEDURE   Multiple patches (electrodes) will be put on your chest. If needed, small areas of your chest may be shaved to get better contact with the electrodes. Once the electrodes are attached to your body, multiple wires will be attached to the electrodes, and your heart rate will be monitored.  An IV access will be started. A nuclear trace (isotope) is given. The isotope may be given intravenously, or it may be swallowed. Nuclear refers to several types of radioactive isotopes, and the nuclear isotope lights up the arteries so that the nuclear images are clear. The isotope is absorbed by your body. This results in low radiation exposure.  A resting nuclear image is taken to show how your heart functions at rest.  A medicine is given through the IV access.  A second scan is done about 1 hour after the  medicine injection and determines how your heart functions under stress.  During this stress phase, you will be connected to an electrocardiogram machine. Your blood pressure and oxygen levels will be monitored. AFTER THE PROCEDURE   Your heart rate and blood pressure will be monitored after the test.  You may return to your normal schedule, including diet,activities, and medicines, unless your health care provider tells you otherwise.   This information is not intended to replace advice given to you by your health care provider. Make sure you discuss any questions you have with your health care provider.   Document Released: 09/17/2008 Document Revised: 05/06/2013 Document Reviewed: 01/06/2013 Elsevier Interactive Patient Education [employer].   If you need a refill on your cardiac medications before your next appointment, please call your pharmacy.

## 2016-03-08 NOTE — Telephone Encounter (Signed)
Pt is requesting the interpretation of the results, not the actual results.  Thanks!

## 2016-03-08 NOTE — Assessment & Plan Note (Addendum)
Patient was diagnosed with severe OSA based on PSG done in 2002. AHI was 47. He had a rpt PAP study in 08/2010 which showed he was good on BiPaP 19/15.  He used cpap x 3 mos in 2002.  He was non compliant so DME got CPAP.  He could not tolerate cpap. He was claustrophobic.  He was non compliant again with 2014 cpap/bipap.  He tried an oral device but once his back teeth were pulled, oral device was deemed not working.  No follow up done.   Pt with recent worsening of hypersomnia.  Home sleep test (July 2017): AHI 26. On auto CPAP 5-10 centimeters water.  Feels better using CPAP. More energy. Less sleepiness. Download the last month: 97%, AHI 3.  Plan : We extensively discussed the importance of treating OSA and the need to use PAP therapy.   Continue with autocpap 5-10 cm water. Has nasal pillows. Feels better using cpap. More energy. Less sleepiness.    Patient was instructed to have mask, tubings, filter, reservoir cleaned at least once a week with soapy water.  Patient was instructed to call the office if he/she is having issues with the PAP device.    I advised patient to obtain sufficient amount of sleep --  7 to 8 hours at least in a 24 hr period.  Patient was advised to follow good sleep hygiene.  Patient was advised NOT to engage in activities requiring concentration and/or vigilance if he/she is and  sleepy.  Patient is NOT to drive if he/she is sleepy.

## 2016-03-08 NOTE — Assessment & Plan Note (Signed)
Weight reduction 

## 2016-03-09 ENCOUNTER — Ambulatory Visit (INDEPENDENT_AMBULATORY_CARE_PROVIDER_SITE_OTHER)
Admission: RE | Admit: 2016-03-09 | Discharge: 2016-03-09 | Disposition: A | Discharge status: Home / Self Care | Payer: Medicare Other | Source: Ambulatory Visit | Attending: Pulmonary Disease | Admitting: Pulmonary Disease

## 2016-03-09 ENCOUNTER — Telehealth: Payer: Self-pay | Admitting: Pulmonary Disease

## 2016-03-09 ENCOUNTER — Inpatient Hospital Stay: Admission: RE | Admit: 2016-03-09 | Payer: Medicare Other | Source: Ambulatory Visit

## 2016-03-09 DIAGNOSIS — R0602 Shortness of breath: Secondary | ICD-10-CM | POA: Diagnosis not present

## 2016-03-09 DIAGNOSIS — R918 Other nonspecific abnormal finding of lung field: Secondary | ICD-10-CM

## 2016-03-09 MED ORDER — IOPAMIDOL (ISOVUE-370) INJECTION 76%
80.0000 mL | Freq: Once | INTRAVENOUS | Status: AC | PRN
  Administered 2016-03-09: 80 mL via INTRAVENOUS

## 2016-03-09 NOTE — Telephone Encounter (Signed)
LB CT called to let us know that pt's CT angio is complete, and has asked [place] to call pt with results.  AD please advise on CT angio.  Thanks!

## 2016-03-09 NOTE — Telephone Encounter (Signed)
  Roann, MD  Kavonte Bearse Madelin Headings, CMA Cc: Marin Roberts, RMA; P [facility] Triage Pool        pls tell pt the chest CTA was (-) for blood clot. It dod show 2-3 small spots in both upper lungs whaich are most likely scars from previous infection. Nothing to worry about. To be certain, we can rpt a chest ct scan in 3-6 mos to make sure the spots dont get bigger. Spots may be too small to biopsy. If he is OK with it, pls schedule a chest ct scan (no contrast) in 3-6 mos (depending on what pt wants), No contrast. Dx is lung nodules. Thanks.    Results have been explained to patient, pt expressed understanding.  Order placed for follow up CT in 3-6 months Nothing further needed.

## 2016-03-09 NOTE — Telephone Encounter (Signed)
lmtcb X1 for pt to make aware of recs.   

## 2016-03-09 NOTE — Telephone Encounter (Signed)
Blood tests were normal. pls ask pt to call his PCP re: results of tests done at ED. Thanks.   Monica Becton, MD 03/09/2016, 9:35 AM [facility] Pager [phone] After 3 pm or if no answer, call [phone]

## 2016-03-10 ENCOUNTER — Encounter (INDEPENDENT_AMBULATORY_CARE_PROVIDER_SITE_OTHER): Payer: Medicare Other | Admitting: Pulmonary Disease

## 2016-03-10 DIAGNOSIS — G4733 Obstructive sleep apnea (adult) (pediatric): Secondary | ICD-10-CM | POA: Diagnosis not present

## 2016-03-10 DIAGNOSIS — R0602 Shortness of breath: Secondary | ICD-10-CM

## 2016-03-10 LAB — PULMONARY FUNCTION TEST
DL/VA % pred: 123 %
DL/VA: 5.67 ml/min/mmHg/L
DLCO COR % PRED: 82 %
DLCO COR: 26.46 ml/min/mmHg
DLCO UNC % PRED: 85 %
DLCO UNC: 27.25 ml/min/mmHg
FEF 25-75 POST: 1.59 L/s
FEF 25-75 PRE: 1.45 L/s
FEF2575-%Change-Post: 9 %
FEF2575-%PRED-PRE: 70 %
FEF2575-%Pred-Post: 77 %
FEV1-%Change-Post: 2 %
FEV1-%PRED-POST: 69 %
FEV1-%PRED-PRE: 67 %
FEV1-POST: 2.01 L
FEV1-Pre: 1.96 L
FEV1FVC-%Change-Post: 10 %
FEV1FVC-%PRED-PRE: 104 %
FEV6-%CHANGE-POST: -7 %
FEV6-%Pred-Post: 63 %
FEV6-%Pred-Pre: 68 %
FEV6-POST: 2.41 L
FEV6-Pre: 2.61 L
FEV6FVC-%Change-Post: 0 %
FEV6FVC-%PRED-POST: 107 %
FEV6FVC-%Pred-Pre: 107 %
FVC-%Change-Post: -6 %
FVC-%PRED-POST: 59 %
FVC-%PRED-PRE: 64 %
FVC-POST: 2.44 L
FVC-PRE: 2.62 L
PRE FEV1/FVC RATIO: 75 %
Post FEV1/FVC ratio: 83 %
Post FEV6/FVC ratio: 100 %
Pre FEV6/FVC Ratio: 100 %
RV % pred: 88 %
RV: 2.29 L
TLC % PRED: 78 %
TLC: 5.49 L

## 2016-03-13 ENCOUNTER — Telehealth (HOSPITAL_COMMUNITY): Payer: Self-pay | Admitting: *Deleted

## 2016-03-13 NOTE — Telephone Encounter (Signed)
Patient given detailed instructions per Myocardial Perfusion Study Information Sheet for the test on 03/16/16 at 0715. Patient notified to arrive 15 minutes early and that it is imperative to arrive on time for appointment to keep from having the test rescheduled.  If you need to cancel or reschedule your appointment, please call the office within 24 hours of your appointment. Failure to do so may result in a cancellation of your appointment, and a $50 no show fee. Patient verbalized understanding.Lilianna Case, Ranae Palms

## 2016-03-13 NOTE — Telephone Encounter (Signed)
Spoke with pt,aware of recs.  Nothing further needed.  

## 2016-03-16 ENCOUNTER — Ambulatory Visit (HOSPITAL_COMMUNITY): Payer: Medicare Other | Attending: Cardiology

## 2016-03-16 DIAGNOSIS — R0600 Dyspnea, unspecified: Secondary | ICD-10-CM | POA: Diagnosis not present

## 2016-03-16 DIAGNOSIS — I4510 Unspecified right bundle-branch block: Secondary | ICD-10-CM | POA: Diagnosis not present

## 2016-03-16 DIAGNOSIS — R0602 Shortness of breath: Secondary | ICD-10-CM | POA: Diagnosis not present

## 2016-03-16 LAB — MYOCARDIAL PERFUSION IMAGING
CHL CUP NUCLEAR SDS: 3
CHL CUP NUCLEAR SRS: 1
CHL CUP NUCLEAR SSS: 4
LV dias vol: 116 mL (ref 62–150)
LV sys vol: 53 mL
NUC STRESS TID: 1.07
Peak HR: 81 {beats}/min
RATE: 0.32
Rest HR: 64 {beats}/min

## 2016-03-16 MED ORDER — TECHNETIUM TC 99M TETROFOSMIN IV KIT
31.5000 | PACK | Freq: Once | INTRAVENOUS | Status: AC | PRN
Start: 2016-03-16 — End: 2016-03-16
  Administered 2016-03-16: 31.5 via INTRAVENOUS
  Filled 2016-03-16: qty 32

## 2016-03-16 MED ORDER — REGADENOSON 0.4 MG/5ML IV SOLN
0.4000 mg | Freq: Once | INTRAVENOUS | Status: AC
  Administered 2016-03-16: 0.4 mg via INTRAVENOUS

## 2016-03-16 MED ORDER — TECHNETIUM TC 99M TETROFOSMIN IV KIT
10.1000 | PACK | Freq: Once | INTRAVENOUS | Status: AC | PRN
  Administered 2016-03-16: 10.1 via INTRAVENOUS
  Filled 2016-03-16: qty 11

## 2016-03-17 ENCOUNTER — Telehealth: Payer: Self-pay | Admitting: Pulmonary Disease

## 2016-03-17 NOTE — Telephone Encounter (Signed)
Notes Recorded by Rush Landmark, MD on 03/13/2016 at 12:23 PM EDT pls tell pt pft did NOT show copd. If he remains SOB and the LHC will be (-), pls let him call back. Thanks. May try inhalers then.  Called and spoke with pt and he is aware of results. Nothing further is needed.

## 2016-03-21 ENCOUNTER — Other Ambulatory Visit: Payer: Self-pay | Admitting: Internal Medicine

## 2016-03-27 ENCOUNTER — Ambulatory Visit (HOSPITAL_COMMUNITY): Payer: Medicare Other | Attending: Cardiology

## 2016-03-27 ENCOUNTER — Other Ambulatory Visit: Payer: Self-pay

## 2016-03-27 DIAGNOSIS — Z8249 Family history of ischemic heart disease and other diseases of the circulatory system: Secondary | ICD-10-CM | POA: Diagnosis not present

## 2016-03-27 DIAGNOSIS — R0602 Shortness of breath: Secondary | ICD-10-CM | POA: Diagnosis not present

## 2016-03-27 DIAGNOSIS — I10 Essential (primary) hypertension: Secondary | ICD-10-CM | POA: Insufficient documentation

## 2016-03-27 DIAGNOSIS — G4733 Obstructive sleep apnea (adult) (pediatric): Secondary | ICD-10-CM | POA: Diagnosis not present

## 2016-03-27 DIAGNOSIS — I4510 Unspecified right bundle-branch block: Secondary | ICD-10-CM | POA: Diagnosis not present

## 2016-03-27 DIAGNOSIS — Z87891 Personal history of nicotine dependence: Secondary | ICD-10-CM | POA: Insufficient documentation

## 2016-03-27 DIAGNOSIS — E119 Type 2 diabetes mellitus without complications: Secondary | ICD-10-CM | POA: Insufficient documentation

## 2016-03-29 DIAGNOSIS — G4733 Obstructive sleep apnea (adult) (pediatric): Secondary | ICD-10-CM | POA: Diagnosis not present

## 2016-04-10 ENCOUNTER — Telehealth: Payer: Self-pay | Admitting: Internal Medicine

## 2016-04-10 NOTE — Telephone Encounter (Signed)
Patient is calling about lab results he had done in Oct. I do not see any notes to give him the results. Maybe I am over looking it. Could you please follow up with him. Thank you.

## 2016-04-10 NOTE — Telephone Encounter (Signed)
Lab Results  Component Value Date   WBC 9.9 03/08/2016   HGB 17.1 (H) 03/08/2016   HCT 50.2 03/08/2016   PLT 227.0 03/08/2016   GLUCOSE 139 (H) 03/08/2016   CHOL 152 10/19/2015   TRIG 361.0 (H) 10/19/2015   HDL 41.40 10/19/2015   LDLDIRECT 85.0 10/19/2015   LDLCALC 72 12/24/2013   ALT 31 03/08/2016   AST 22 03/08/2016   NA 138 03/08/2016   K 4.1 03/08/2016   CL 101 03/08/2016   CREATININE 1.14 03/08/2016   BUN 19 03/08/2016   CO2 25 03/08/2016   TSH 1.44 05/04/2015   PSA 2.87 10/19/2015   HGBA1C 7.0 (H) 03/08/2016   MICROALBUR 10.5 (H) 03/08/2016    His triglycerides were too high His A1c is 7.0%, average blood sugars are adequately well controlled He has early signs of diabetic kidney damage Other labs are all okay

## 2016-04-11 NOTE — Telephone Encounter (Signed)
Pt given results and Sent copy in mail

## 2016-04-28 DIAGNOSIS — G4733 Obstructive sleep apnea (adult) (pediatric): Secondary | ICD-10-CM | POA: Diagnosis not present

## 2016-05-02 ENCOUNTER — Encounter: Payer: Self-pay | Admitting: Family Medicine

## 2016-05-02 ENCOUNTER — Ambulatory Visit (INDEPENDENT_AMBULATORY_CARE_PROVIDER_SITE_OTHER): Payer: Medicare Other | Admitting: Family Medicine

## 2016-05-02 VITALS — BP 130/78 | HR 79 | Temp 98.2°F | Resp 12 | Ht 70.0 in | Wt 261.1 lb

## 2016-05-02 DIAGNOSIS — J069 Acute upper respiratory infection, unspecified: Secondary | ICD-10-CM

## 2016-05-02 LAB — POCT INFLUENZA A/B
Influenza A, POC: NEGATIVE
Influenza B, POC: NEGATIVE

## 2016-05-02 MED ORDER — FLUTICASONE PROPIONATE 50 MCG/ACT NA SUSP: 2.0000 | Freq: Every day | NASAL | 0 refills | Status: DC

## 2016-05-02 MED ORDER — BENZONATATE 100 MG PO CAPS: 200.0000 mg | ORAL_CAPSULE | Freq: Two times a day (BID) | ORAL | 0 refills | Status: DC | PRN

## 2016-05-02 NOTE — Patient Instructions (Addendum)
    [name] I have seen you today for an acute visit.  1. URI, acute  - benzonatate (TESSALON) 100 MG capsule; Take 2 capsules (200 mg total) by mouth 2 (two) times daily as needed for cough.  Dispense: 45 capsule; Refill: 0    In general please monitor for signs of worsening symptoms and seek immediate medical attention if any concerning/warning symptom as we discussed.     Please be sure you have an appointment already scheduled with your PCP before you leave today.  It is contagious.  viral infections are self-limited and we treat each symptom depending of severity.  Over the counter medications as decongestants and cold medications usually help, they need to be taken with caution if there is a history of high blood pressure or palpitations; so not recommended.  Tylenol and/or Ibuprofen also helps with most symptoms (headache, muscle aching, fever,etc) Plenty of fluids. Honey helps with cough. Steam inhalations helps with runny nose, nasal congestion, and may prevent sinus infections. Cough and nasal congestion could last a few days and sometimes weeks. Please follow in not any better in 1-2 weeks or if symptoms get worse.

## 2016-05-02 NOTE — Progress Notes (Signed)
Pre visit review using our clinic review tool, if applicable. No additional management support is needed unless otherwise documented below in the visit note. 

## 2016-05-02 NOTE — Progress Notes (Signed)
HPI:  ACUTE VISIT:  Chief Complaint  Patient presents with  . Cough    [name] is a 80 y.o. male, who is here today complaining of 2-3 days of respiratory symptoms.  Started with sore throat. Yesterday he noted non productive cough,rhinorrhea, sneezing, and nasal congestion.  "Little" fever, no chill or myalgias.  Denies chest pain, dyspnea, or wheezing.   No Hx of recent travel. Sick contact: "Somebody' was coughing around him last week. No Hx of allergies.  OTC medications for this problem: Cough syrup he had left from old Rx and took Ibuprofen yesterday. Symptoms are better today.   He is supposed to be traveling in the next couple days to gatherwith family and friends, elderly family members with chronic diseases, so he is going to need a letter so he can cancel airplane ticket.   Review of Systems  Constitutional: Positive for fatigue and fever. Negative for appetite change and chills.  HENT: Positive for congestion, postnasal drip, rhinorrhea, sneezing and sore throat. Negative for ear pain, mouth sores, nosebleeds, trouble swallowing and voice change.   Eyes: Negative for discharge, redness and itching.  Respiratory: Positive for cough. Negative for chest tightness, shortness of breath and wheezing.   Cardiovascular: Negative for chest pain.  Gastrointestinal: Negative for abdominal pain, diarrhea, nausea and vomiting.  Musculoskeletal: Negative for myalgias and neck pain.  Skin: Negative for rash.  Neurological: Negative for syncope, weakness and headaches.      Current Outpatient Prescriptions on File Prior to Visit  Medication Sig Dispense Refill  . aspirin 81 MG tablet Take 81 mg by mouth daily.      . colchicine 0.6 MG tablet Take 1.2 mg by mouth daily as needed (gout). Take an additional 0.6 mg by mouth two (2) hours later if needed again for gout.    . INVOKAMET [id number] MG TABS TAKE 1 TABLET BY MOUTH 2 (TWO) TIMES DAILY. 60 tablet  5  . losartan-hydrochlorothiazide (HYZAAR) 100-25 MG tablet TAKE 1 TABLET BY MOUTH DAILY. 90 tablet 1  . Multiple Vitamin (MULTIVITAMIN) tablet Take 1 tablet by mouth daily. Reported on 05/04/2015    . Omega-3 Fatty Acids (FISH OIL) 1000 MG CPDR Take 1,000 mg by mouth daily.    . sildenafil (REVATIO) 20 MG tablet Take 1 tablet (20 mg total) by mouth daily. One qd as needed 10 tablet 11   No current facility-administered medications on file prior to visit.      Past Medical History:  Diagnosis Date  . Arthritis   . Diabetes mellitus    NO INSULIN  . Dyspnea   . Gout   . History of colonic polyps   . HTN (hypertension)   . Localized osteoarthrosis not specified whether primary or secondary, hand   . OSA (obstructive sleep apnea)   . Other specified cardiac dysrhythmias(427.89) 2004   ABLATION DUE TO TACHYCARDIA  . Right bundle branch block    Allergies  Allergen Reactions  . Livalo [Pitavastatin] Other (See Comments)    Muscle aches  . Crestor [Rosuvastatin]     Muscle aches  . Pravastatin     Muscle aches    Social History   Social History  . Marital status: Single    Spouse name: N/A  . Number of children: N/A  . Years of education: 2   Occupational History  . self employed, Recruitment consultant   .  Self-Employed   Social History Main Topics  . Smoking status: Former  Smoker    Packs/day: 2.00    Years: 30.00    Types: Cigarettes    Quit date: 05/15/1984  . Smokeless tobacco: Never Used  . Alcohol use No     Comment: rare use  . Drug use: No  . Sexual activity: Not Currently   Other Topics Concern  . None   Social History Narrative   HS[employer], [place]   Single   Work: self employed...Recruitment consultant   I-ADLs   End-of-Life: Yes-CPR, Yes-short-term Mechanical Ventilation, but no prolonged ventilation. Is willing to undergo dialysis, prolonged tub feeding.   Pt states former smoker. 1986. 2ppd. Started age 63    Vitals:   05/02/16 1310  BP: 130/78    Pulse: 79  Resp: 12  Temp: 98.2 F (36.8 C)    O2 sat 95% at RA.  Body mass index is 37.47 kg/m.    Physical Exam  Nursing note and vitals reviewed. Constitutional: He is oriented to person, place, and time. He appears well-developed. He does not appear ill. No distress.  HENT:  Head: Atraumatic.  Right Ear: Tympanic membrane, external ear and ear canal normal.  Left Ear: Tympanic membrane, external ear and ear canal normal.  Nose: Rhinorrhea present. Right sinus exhibits no maxillary sinus tenderness and no frontal sinus tenderness. Left sinus exhibits no maxillary sinus tenderness and no frontal sinus tenderness.  Mouth/Throat: Oropharynx is clear and moist and mucous membranes are normal.  Eyes: Conjunctivae and EOM are normal.  Cardiovascular: Normal rate.  An irregular rhythm present.  No murmur heard. Respiratory: Effort normal and breath sounds normal. No stridor. No respiratory distress.  Lymphadenopathy:       Head (right side): No submandibular adenopathy present.       Head (left side): No submandibular adenopathy present.    He has cervical adenopathy.       Right cervical: Posterior cervical adenopathy present.       Left cervical: Posterior cervical adenopathy present.  Neurological: He is alert and oriented to person, place, and time. He has normal strength.  Skin: Skin is warm. No rash noted. No erythema.  Psychiatric: He has a normal mood and affect. His speech is normal.  Well groomed, good eye contact.      ASSESSMENT AND PLAN:     Matthew Villanueva was seen today for cough.  Diagnoses and all orders for this visit:  URI, acute -     benzonatate (TESSALON) 100 MG capsule; Take 2 capsules (200 mg total) by mouth 2 (two) times daily as needed for cough. -     POCT Influenza A/B -     fluticasone (FLONASE) 50 MCG/ACT nasal spray; Place 2 sprays into both nostrils daily.   Symptoms suggests a viral etiology, I explained patient that symptomatic treatment is  usually recommended in this case, so I do not think abx is needed at this time. Instructed to monitor for signs of complications, including new onset of fever among some, clearly instructed about warning signs. I also explained that cough and nasal congestion can last a few days and sometimes weeks. F/U as needed.  Letter given so he can cancel flight.     Return if symptoms worsen or fail to improve.     -[name] was advised to return or notify a doctor immediately if symptoms worsen or persist or new concerns arise.       Evin Chirco G. Martin[place], MD  [facility]. [place] office.

## 2016-05-10 ENCOUNTER — Encounter: Payer: Self-pay | Admitting: Internal Medicine

## 2016-05-10 ENCOUNTER — Ambulatory Visit (INDEPENDENT_AMBULATORY_CARE_PROVIDER_SITE_OTHER): Payer: Medicare Other | Admitting: Internal Medicine

## 2016-05-10 VITALS — BP 128/80 | HR 63 | Temp 98.0°F | Resp 20 | Wt 260.0 lb

## 2016-05-10 DIAGNOSIS — E1121 Type 2 diabetes mellitus with diabetic nephropathy: Secondary | ICD-10-CM

## 2016-05-10 DIAGNOSIS — I10 Essential (primary) hypertension: Secondary | ICD-10-CM | POA: Diagnosis not present

## 2016-05-10 DIAGNOSIS — R05 Cough: Secondary | ICD-10-CM

## 2016-05-10 DIAGNOSIS — R059 Cough, unspecified: Secondary | ICD-10-CM

## 2016-05-10 MED ORDER — HYDROCODONE-HOMATROPINE 5-1.5 MG/5ML PO SYRP: 5.0000 mL | ORAL_SOLUTION | Freq: Four times a day (QID) | ORAL | 0 refills | Status: AC | PRN

## 2016-05-10 MED ORDER — AZITHROMYCIN 250 MG PO TABS: ORAL_TABLET | ORAL | 1 refills | Status: DC

## 2016-05-10 NOTE — Progress Notes (Signed)
Pre visit review using our clinic review tool, if applicable. No additional management support is needed unless otherwise documented below in the visit note. 

## 2016-05-10 NOTE — Progress Notes (Signed)
Subjective:    Patient ID: Matthew Villanueva, male    DOB: 1935-05-23, 80 y.o.   MRN: [id number]  HPI  Here with acute onset mild to mod 7 days ST, HA, general weakness and malaise, with now prod cough greenish sputum, but Pt denies chest pain, increased sob or doe, wheezing, orthopnea, PND, increased LE swelling, palpitations, dizziness or syncope. Was seen at [facility], but Tess perle just not working, miserable, cant sleep at night Past Medical History:  Diagnosis Date  . Arthritis   . Diabetes mellitus    NO INSULIN  . Dyspnea   . Gout   . History of colonic polyps   . HTN (hypertension)   . Localized osteoarthrosis not specified whether primary or secondary, hand   . OSA (obstructive sleep apnea)   . Other specified cardiac dysrhythmias(427.89) 2004   ABLATION DUE TO TACHYCARDIA  . Right bundle branch block    Past Surgical History:  Procedure Laterality Date  . CARDIAC ELECTROPHYSIOLOGY STUDY AND ABLATION  2004   ablation for SVT with a chronic right bundle branch block   . [place]  . PILONIDAL CYST DRAINAGE      reports that he quit smoking about 32 years ago. His smoking use included Cigarettes. He has a 60.00 pack-year smoking history. He has never used smokeless tobacco. He reports that he does not drink alcohol or use drugs. family history includes Cancer in his father; Diabetes in his maternal uncle; Heart attack in his maternal grandmother; Heart disease in his maternal aunt, maternal grandfather, maternal grandmother, and mother; Kidney cancer in his sister. Allergies  Allergen Reactions  . Livalo [Pitavastatin] Other (See Comments)    Muscle aches  . Crestor [Rosuvastatin]     Muscle aches  . Pravastatin     Muscle aches   Current Outpatient Prescriptions on File Prior to Visit  Medication Sig Dispense Refill  . aspirin 81 MG tablet Take 81 mg by mouth daily.      . colchicine 0.6 MG tablet Take 1.2 mg by mouth daily as needed (gout). Take an  additional 0.6 mg by mouth two (2) hours later if needed again for gout.    . fluticasone (FLONASE) 50 MCG/ACT nasal spray Place 2 sprays into both nostrils daily. 16 g 0  . INVOKAMET [phone] MG TABS TAKE 1 TABLET BY MOUTH 2 (TWO) TIMES DAILY. 60 tablet 5  . losartan-hydrochlorothiazide (HYZAAR) 100-25 MG tablet TAKE 1 TABLET BY MOUTH DAILY. 90 tablet 1  . Multiple Vitamin (MULTIVITAMIN) tablet Take 1 tablet by mouth daily. Reported on 05/04/2015    . Omega-3 Fatty Acids (FISH OIL) 1000 MG CPDR Take 1,000 mg by mouth daily.    . sildenafil (REVATIO) 20 MG tablet Take 1 tablet (20 mg total) by mouth daily. One qd as needed 10 tablet 11   No current facility-administered medications on file prior to visit.    Review of Systems  Constitutional: Negative for unusual diaphoresis or night sweats HENT: Negative for ear swelling or discharge Eyes: Negative for worsening visual haziness  Respiratory: Negative for choking and stridor.   Gastrointestinal: Negative for distension or worsening eructation Genitourinary: Negative for retention or change in urine volume.  Musculoskeletal: Negative for other MSK pain or swelling Skin: Negative for color change and worsening wound Neurological: Negative for tremors and numbness other than noted  Psychiatric/Behavioral: Negative for decreased concentration or agitation other than above   All other system neg per pt  Objective:   Physical Exam BP 128/80   Pulse 63   Temp 98 F (36.7 C) (Oral)   Resp 20   Wt 260 lb (117.9 kg)   SpO2 96%   BMI 37.31 kg/m  VS noted, mild ill Constitutional: Pt appears in no apparent distress HENT: Head: NCAT.  Right Ear: External ear normal.  Left Ear: External ear normal.  Eyes: . Pupils are equal, round, and reactive to light. Conjunctivae and EOM are normal Bilat tm's with mild erythema.  Max sinus areas non tender.  Pharynx with mild erythema, no exudate Neck: Normal range of motion. Neck supple.    Cardiovascular: Normal rate and regular rhythm.   Pulmonary/Chest: Effort normal and breath sounds decreased without rales or wheezing.  Neurological: Pt is alert. Not confused , motor grossly intact Skin: Skin is warm. No rash, no LE edema Psychiatric: Pt behavior is normal. No agitation.     Assessment & Plan:

## 2016-05-10 NOTE — Assessment & Plan Note (Signed)
Mild to mod, c/w bronchitis vs pna, for antibx course, cough med prn,  to f/u any worsening symptoms or concerns 

## 2016-05-10 NOTE — Patient Instructions (Signed)
Ok to stop the tessalon perle  Please take all new medication as prescribed - the antibiotic, and cough medicine if needed Please continue all other medications as before, and refills have been done if requested.  Please have the pharmacy call with any other refills you may need.  Please keep your appointments with your specialists as you may have planned

## 2016-05-10 NOTE — Assessment & Plan Note (Signed)
Lab Results  Component Value Date   HGBA1C 7.0 (H) 03/08/2016   stable overall by history and exam, recent data reviewed with pt, and pt to continue medical treatment as before,  to f/u any worsening symptoms or concerns, pt to call for onset polys or cbg > 200

## 2016-05-10 NOTE — Assessment & Plan Note (Signed)
stable overall by history and exam, recent data reviewed with pt, and pt to continue medical treatment as before,  to f/u any worsening symptoms or concerns BP Readings from Last 3 Encounters:  05/10/16 128/80  05/02/16 130/78  03/08/16 110/70

## 2016-05-29 DIAGNOSIS — G4733 Obstructive sleep apnea (adult) (pediatric): Secondary | ICD-10-CM | POA: Diagnosis not present

## 2016-06-19 ENCOUNTER — Inpatient Hospital Stay: Admission: RE | Admit: 2016-06-19 | Payer: Medicare Other | Source: Ambulatory Visit

## 2016-06-29 DIAGNOSIS — G4733 Obstructive sleep apnea (adult) (pediatric): Secondary | ICD-10-CM | POA: Diagnosis not present

## 2016-07-27 DIAGNOSIS — G4733 Obstructive sleep apnea (adult) (pediatric): Secondary | ICD-10-CM | POA: Diagnosis not present

## 2016-08-15 ENCOUNTER — Ambulatory Visit: Payer: Medicare Other | Admitting: Pulmonary Disease

## 2016-08-17 ENCOUNTER — Ambulatory Visit (INDEPENDENT_AMBULATORY_CARE_PROVIDER_SITE_OTHER): Payer: Medicare Other | Admitting: Internal Medicine

## 2016-08-17 ENCOUNTER — Encounter: Payer: Self-pay | Admitting: Internal Medicine

## 2016-08-17 VITALS — BP 128/78 | HR 73 | Temp 98.3°F | Wt 269.0 lb

## 2016-08-17 DIAGNOSIS — R69 Illness, unspecified: Secondary | ICD-10-CM | POA: Diagnosis not present

## 2016-08-17 DIAGNOSIS — I10 Essential (primary) hypertension: Secondary | ICD-10-CM

## 2016-08-17 DIAGNOSIS — J111 Influenza due to unidentified influenza virus with other respiratory manifestations: Secondary | ICD-10-CM

## 2016-08-17 DIAGNOSIS — E1121 Type 2 diabetes mellitus with diabetic nephropathy: Secondary | ICD-10-CM | POA: Diagnosis not present

## 2016-08-17 MED ORDER — HYDROCODONE-HOMATROPINE 5-1.5 MG/5ML PO SYRP: 5.0000 mL | ORAL_SOLUTION | Freq: Four times a day (QID) | ORAL | 0 refills | Status: AC | PRN

## 2016-08-17 MED ORDER — OSELTAMIVIR PHOSPHATE 75 MG PO CAPS: 75.0000 mg | ORAL_CAPSULE | Freq: Two times a day (BID) | ORAL | 0 refills | Status: DC

## 2016-08-17 NOTE — Progress Notes (Signed)
Pre visit review using our clinic review tool, if applicable. No additional management support is needed unless otherwise documented below in the visit note. 

## 2016-08-17 NOTE — Patient Instructions (Signed)
Please take all new medication as prescribed  - the tamiflu, and the cough medicine if needed  OK to HOLD on taking the Hyzaar (losartan HCT) during the diarrhea, but then re-start soon after  Please check your blood pressure on a regular basis, with the goal being to be < 140/90 for now  Please continue to check your sugars, and call for any over 200, as this may be due to your illness  Please continue all other medications as before, and refills have been done if requested.  Please have the pharmacy call with any other refills you may need.  Please keep your appointments with your specialists as you may have planned

## 2016-08-17 NOTE — Progress Notes (Signed)
Subjective:    Patient ID: Matthew Villanueva, male    DOB: 1935/07/11, 81 y.o.   MRN: [id number]  HPI  Here with 2-3 days onset runny eyes, dry cough, sneezing, general aching and loose and frequent stools without high fever, chills or blood.  No sick contact  Pt denies chest pain, increased sob or doe, wheezing, orthopnea, PND, increased LE swelling, palpitations, dizziness or syncope.  Pt denies new neurological symptoms such as new headache, or facial or extremity weakness or numbness   Pt denies polydipsia, polyuria,  Past Medical History:  Diagnosis Date  . Arthritis   . Diabetes mellitus    NO INSULIN  . Dyspnea   . Gout   . History of colonic polyps   . HTN (hypertension)   . Localized osteoarthrosis not specified whether primary or secondary, hand   . OSA (obstructive sleep apnea)   . Other specified cardiac dysrhythmias(427.89) 2004   ABLATION DUE TO TACHYCARDIA  . Right bundle branch block    Past Surgical History:  Procedure Laterality Date  . CARDIAC ELECTROPHYSIOLOGY STUDY AND ABLATION  2004   ablation for SVT with a chronic right bundle branch block   . [place]  . PILONIDAL CYST DRAINAGE      reports that he quit smoking about 32 years ago. His smoking use included Cigarettes. He has a 60.00 pack-year smoking history. He has never used smokeless tobacco. He reports that he does not drink alcohol or use drugs. family history includes Cancer in his father; Diabetes in his maternal uncle; Heart attack in his maternal grandmother; Heart disease in his maternal aunt, maternal grandfather, maternal grandmother, and mother; Kidney cancer in his sister. Allergies  Allergen Reactions  . Livalo [Pitavastatin] Other (See Comments)    Muscle aches  . Crestor [Rosuvastatin]     Muscle aches  . Pravastatin     Muscle aches   Current Outpatient Prescriptions on File Prior to Visit  Medication Sig Dispense Refill  . aspirin 81 MG tablet Take 81 mg by mouth  daily.      . colchicine 0.6 MG tablet Take 1.2 mg by mouth daily as needed (gout). Take an additional 0.6 mg by mouth two (2) hours later if needed again for gout.    . INVOKAMET [phone] MG TABS TAKE 1 TABLET BY MOUTH 2 (TWO) TIMES DAILY. 60 tablet 5  . losartan-hydrochlorothiazide (HYZAAR) 100-25 MG tablet TAKE 1 TABLET BY MOUTH DAILY. 90 tablet 1  . Multiple Vitamin (MULTIVITAMIN) tablet Take 1 tablet by mouth daily. Reported on 05/04/2015    . Omega-3 Fatty Acids (FISH OIL) 1000 MG CPDR Take 1,000 mg by mouth daily.    . sildenafil (REVATIO) 20 MG tablet Take 1 tablet (20 mg total) by mouth daily. One qd as needed 10 tablet 11  . fluticasone (FLONASE) 50 MCG/ACT nasal spray Place 2 sprays into both nostrils daily. 16 g 0   No current facility-administered medications on file prior to visit.     Review of Systems   Constitutional: Negative for other unusual diaphoresis or sweats HENT: Negative for ear discharge or swelling Eyes: Negative for other worsening visual disturbances Respiratory: Negative for stridor or other swelling  Gastrointestinal: Negative for worsening distension or other blood Genitourinary: Negative for retention or other urinary change Musculoskeletal: Negative for other MSK pain or swelling Skin: Negative for color change or other new lesions Neurological: Negative for worsening tremors and other numbness  Psychiatric/Behavioral: Negative for worsening agitation  or other fatigue All otherwise neg per pt     Objective:   Physical Exam BP 128/78   Pulse 73   Temp 98.3 F (36.8 C) (Oral)   Wt 269 lb (122 kg)   SpO2 96%   BMI 38.60 kg/m   VS noted,  Constitutional: Pt appears in NAD HENT: Head: NCAT.  Right Ear: External ear normal.  Left Ear: External ear normal.  Eyes: . Pupils are equal, round, and reactive to light. Conjunctivae and EOM are normal Nose: without d/c or deformity Bilat tm's with mild erythema.  Max sinus areasnon tender.  Pharynx with  mild erythema, no exudate Neck: Neck supple. Gross normal ROM Cardiovascular: Normal rate and regular rhythm.   Pulmonary/Chest: Effort normal and breath sounds without rales or wheezing.  Abd:  Soft, NT, ND, + BS, no organomegaly Neurological: Pt is alert. At baseline orientation, motor grossly intact Skin: Skin is warm. No rashes, other new lesions, no LE edema Psychiatric: Pt behavior is normal without agitation  No other exam findings    Assessment & Plan:

## 2016-08-20 NOTE — Assessment & Plan Note (Addendum)
stable overall by history and exam, recent data reviewed with pt, and pt to continue medical treatment as before except to hold taking the hyzaar during diarrhea,  to f/u any worsening symptoms or concerns BP Readings from Last 3 Encounters:  08/17/16 128/78  05/10/16 128/80  05/02/16 130/78

## 2016-08-20 NOTE — Assessment & Plan Note (Signed)
stable overall by history and exam, recent data reviewed with pt, and pt to continue medical treatment as before,  to f/u any worsening symptoms or concerns Lab Results  Component Value Date   HGBA1C 7.0 (H) 03/08/2016

## 2016-08-20 NOTE — Assessment & Plan Note (Signed)
c/w likely viral illness, for tamiflu asd, cough med prn, and immoidium otc prn

## 2016-08-27 DIAGNOSIS — G4733 Obstructive sleep apnea (adult) (pediatric): Secondary | ICD-10-CM | POA: Diagnosis not present

## 2016-09-05 NOTE — Progress Notes (Signed)
Cardiology Office Note    Date:  09/08/2016   ID:  Matthew, Villanueva [birth year], MRN [id number]  PCP:  Scarlette Calico, MD  Cardiologist:  Dr. Johnsie Cancel   CC:  Nausea   History of Present Illness:  Matthew Villanueva is a 81 y.o. male with a history of SVT s/p ablation (in [place]), HTN, RBBB, obesity, OSA on CPAP, DMT2 Seen by PA 02/2016 for dyspnea with benign w/u.  He has no history of CAD  Reviewed myovue from 03/16/16 normal no ischemia EF 54%  Reviewed echo from 03/27/16: No valve disease EF 55-60%   Semi retired Programmer, systems during Pharmacist, hospital [employer]  Has ambient PAC;s which are asymptomatic   Having nausea and diarrhea with his DM meds needs to adjust with primary     Past Medical History:  Diagnosis Date  . Arthritis   . Diabetes mellitus    NO INSULIN  . Dyspnea   . Gout   . History of colonic polyps   . HTN (hypertension)   . Localized osteoarthrosis not specified whether primary or secondary, hand   . OSA (obstructive sleep apnea)   . Other specified cardiac dysrhythmias(427.89) 2004   ABLATION DUE TO TACHYCARDIA  . Right bundle branch block     Past Surgical History:  Procedure Laterality Date  . CARDIAC ELECTROPHYSIOLOGY STUDY AND ABLATION  2004   ablation for SVT with a chronic right bundle branch block   . [place]  . PILONIDAL CYST DRAINAGE      Current Medications: Outpatient Medications Prior to Visit  Medication Sig Dispense Refill  . aspirin 81 MG tablet Take 81 mg by mouth daily.      . colchicine 0.6 MG tablet Take 1.2 mg by mouth daily as needed (gout). Take an additional 0.6 mg by mouth two (2) hours later if needed again for gout.    . INVOKAMET [phone] MG TABS TAKE 1 TABLET BY MOUTH 2 (TWO) TIMES DAILY. 60 tablet 5  . losartan-hydrochlorothiazide (HYZAAR) 100-25 MG tablet TAKE 1 TABLET BY MOUTH DAILY. 90 tablet 1  . Multiple Vitamin (MULTIVITAMIN) tablet Take 1 tablet by mouth daily. Reported on  05/04/2015    . Omega-3 Fatty Acids (FISH OIL) 1000 MG CPDR Take 1,000 mg by mouth daily.    . fluticasone (FLONASE) 50 MCG/ACT nasal spray Place 2 sprays into both nostrils daily. 16 g 0  . oseltamivir (TAMIFLU) 75 MG capsule Take 1 capsule (75 mg total) by mouth 2 (two) times daily. 10 capsule 0  . sildenafil (REVATIO) 20 MG tablet Take 1 tablet (20 mg total) by mouth daily. One qd as needed 10 tablet 11   No facility-administered medications prior to visit.      Allergies:   Livalo [pitavastatin]; Crestor [rosuvastatin]; and Pravastatin   Social History   Social History  . Marital status: Single    Spouse name: N/A  . Number of children: N/A  . Years of education: 78   Occupational History  . self employed, Recruitment consultant   .  Self-Employed   Social History Main Topics  . Smoking status: Former Smoker    Packs/day: 2.00    Years: 30.00    Types: Cigarettes    Quit date: 05/15/1984  . Smokeless tobacco: Never Used  . Alcohol use No     Comment: rare use  . Drug use: No  . Sexual activity: Not Currently   Other Topics Concern  . None  Social History Narrative   [employer], [place]   Single   Work: self employed...Recruitment consultant   I-ADLs   End-of-Life: Yes-CPR, Yes-short-term Mechanical Ventilation, but no prolonged ventilation. Is willing to undergo dialysis, prolonged tub feeding.   Pt states former smoker. 1986. 2ppd. Started age 14     Family History:  The patient's family history includes Cancer in his father; Diabetes in his maternal uncle; Heart attack in his maternal grandmother; Heart disease in his maternal aunt, maternal grandfather, maternal grandmother, and mother; Kidney cancer in his sister.     ROS:   Please see the history of present illness.    ROS All other systems reviewed and are negative.   PHYSICAL EXAM:   VS:  BP 120/70   Pulse 73   Ht 5\' 10"  (1.778 m)   Wt 255 lb (115.7 kg)   SpO2 96%   BMI 36.59 kg/m    Affect  appropriate Overweight white male  HEENT: normal Neck supple with no adenopathy JVP normal no bruits no thyromegaly Lungs clear with no wheezing and good diaphragmatic motion Heart:  S1/S2 no murmur, no rub, gallop or click PMI normal Abdomen: benighn, BS positve, no tenderness, no AAA no bruit.  No HSM or HJR Distal pulses intact with no bruits No edema Neuro non-focal Skin warm and dry No muscular weakness  Wt Readings from Last 3 Encounters:  09/08/16 255 lb (115.7 kg)  08/17/16 269 lb (122 kg)  05/10/16 260 lb (117.9 kg)      Studies/Labs Reviewed:   EKG:   03/07/16  SR RBBB PVC rate 79  Recent Labs: 03/06/2016: B Natriuretic Peptide 52.3 03/08/2016: ALT 31; BUN 19; Creatinine, Ser 1.14; Hemoglobin 17.1; Platelets 227.0; Potassium 4.1; Sodium 138   Lipid Panel    Component Value Date/Time   CHOL 152 10/19/2015 1547   TRIG 361.0 (H) 10/19/2015 1547   HDL 41.40 10/19/2015 1547   CHOLHDL 4 10/19/2015 1547   VLDL 72.2 (H) 10/19/2015 1547   LDLCALC 72 12/24/2013 1141   LDLDIRECT 85.0 10/19/2015 1547    Additional studies/ records that were reviewed today include:  ETT/Myoview 04/2013 Conclusions PACs develop during recovery only. No evidence of exercise induced ischemia. Hypertensive response to exercise , otherwise normal stress test.   ASSESSMENT & PLAN:   SOB: Improved normal echo and exam doubt cardiac etiology    PAC;s : Benign in setting of non ischemic myovue and normal EF   HTN: Well controlled.  Continue current medications and low sodium Dash type diet.    RBBB: this has been stable over time. Yearly ECG   SVT s/p ablation: no recent palpitations .  DMT2: most recent HgA1c 7.0. Goal <6.5 needs to change oral agent current causing nausea and diarrhea   OSA: continue CPAP   Jenkins Rouge, MD

## 2016-09-08 ENCOUNTER — Encounter: Payer: Self-pay | Admitting: Cardiovascular Disease

## 2016-09-08 ENCOUNTER — Ambulatory Visit (INDEPENDENT_AMBULATORY_CARE_PROVIDER_SITE_OTHER): Payer: Medicare Other | Admitting: Cardiovascular Disease

## 2016-09-08 VITALS — BP 120/70 | HR 73 | Ht 70.0 in | Wt 255.0 lb

## 2016-09-08 DIAGNOSIS — I471 Supraventricular tachycardia: Secondary | ICD-10-CM

## 2016-09-08 DIAGNOSIS — I493 Ventricular premature depolarization: Secondary | ICD-10-CM | POA: Diagnosis not present

## 2016-09-08 DIAGNOSIS — I4510 Unspecified right bundle-branch block: Secondary | ICD-10-CM

## 2016-09-08 NOTE — Patient Instructions (Addendum)

## 2016-09-12 ENCOUNTER — Encounter: Payer: Self-pay | Admitting: Internal Medicine

## 2016-09-12 ENCOUNTER — Other Ambulatory Visit (INDEPENDENT_AMBULATORY_CARE_PROVIDER_SITE_OTHER): Payer: Medicare Other

## 2016-09-12 ENCOUNTER — Ambulatory Visit (INDEPENDENT_AMBULATORY_CARE_PROVIDER_SITE_OTHER): Payer: Medicare Other | Admitting: Internal Medicine

## 2016-09-12 VITALS — BP 128/72 | HR 51 | Temp 98.1°F | Resp 16 | Ht 70.0 in | Wt 256.0 lb

## 2016-09-12 DIAGNOSIS — E785 Hyperlipidemia, unspecified: Secondary | ICD-10-CM | POA: Diagnosis not present

## 2016-09-12 DIAGNOSIS — I10 Essential (primary) hypertension: Secondary | ICD-10-CM

## 2016-09-12 DIAGNOSIS — E781 Pure hyperglyceridemia: Secondary | ICD-10-CM

## 2016-09-12 DIAGNOSIS — E1121 Type 2 diabetes mellitus with diabetic nephropathy: Secondary | ICD-10-CM | POA: Diagnosis not present

## 2016-09-12 LAB — COMPREHENSIVE METABOLIC PANEL
ALT: 26 U/L (ref 0–53)
AST: 21 U/L (ref 0–37)
Albumin: 3.8 g/dL (ref 3.5–5.2)
Alkaline Phosphatase: 18 U/L — ABNORMAL LOW (ref 39–117)
BUN: 16 mg/dL (ref 6–23)
CALCIUM: 9.1 mg/dL (ref 8.4–10.5)
CHLORIDE: 102 meq/L (ref 96–112)
CO2: 30 meq/L (ref 19–32)
Creatinine, Ser: 1.03 mg/dL (ref 0.40–1.50)
GFR: 74.13 mL/min (ref >60.00)
Glucose, Bld: 168 mg/dL — ABNORMAL HIGH (ref 70–99)
POTASSIUM: 3.6 meq/L (ref 3.5–5.1)
Sodium: 139 mEq/L (ref 135–145)
Total Bilirubin: 0.5 mg/dL (ref 0.2–1.2)
Total Protein: 7 g/dL (ref 6.0–8.3)

## 2016-09-12 LAB — CBC WITH DIFFERENTIAL/PLATELET
BASOS PCT: 0.8 % (ref 0.0–3.0)
Basophils Absolute: 0.1 10*3/uL (ref 0.0–0.1)
Eosinophils Absolute: 0.2 10*3/uL (ref 0.0–0.7)
Eosinophils Relative: 2 % (ref 0.0–5.0)
HEMATOCRIT: 48.6 % (ref 39.0–52.0)
HEMOGLOBIN: 16.4 g/dL (ref 13.0–17.0)
LYMPHS PCT: 22.3 % (ref 12.0–46.0)
Lymphs Abs: 2.1 10*3/uL (ref 0.7–4.0)
MCHC: 33.7 g/dL (ref 30.0–36.0)
MCV: 87.3 fl (ref 78.0–100.0)
MONOS PCT: 10 % (ref 3.0–12.0)
Monocytes Absolute: 0.9 10*3/uL (ref 0.1–1.0)
NEUTROS ABS: 6 10*3/uL (ref 1.4–7.7)
Neutrophils Relative %: 64.9 % (ref 43.0–77.0)
PLATELETS: 208 10*3/uL (ref 150.0–400.0)
RBC: 5.57 Mil/uL (ref 4.22–5.81)
RDW: 15.3 % (ref 11.5–15.5)
WBC: 9.3 10*3/uL (ref 4.0–10.5)

## 2016-09-12 LAB — LIPID PANEL
CHOL/HDL RATIO: 4
Cholesterol: 155 mg/dL (ref 0–200)
HDL: 40.4 mg/dL (ref >39.00)
NONHDL: 114.67
Triglycerides: 347 mg/dL — ABNORMAL HIGH (ref 0.0–149.0)
VLDL: 69.4 mg/dL — ABNORMAL HIGH (ref 0.0–40.0)

## 2016-09-12 LAB — LDL CHOLESTEROL, DIRECT: Direct LDL: 88 mg/dL

## 2016-09-12 LAB — POCT GLYCOSYLATED HEMOGLOBIN (HGB A1C): Hemoglobin A1C: 6.8

## 2016-09-12 MED ORDER — EMPAGLIFLOZIN 25 MG PO TABS: 25.0000 mg | ORAL_TABLET | Freq: Every day | ORAL | 1 refills | Status: DC

## 2016-09-12 NOTE — Progress Notes (Signed)
Pre visit review using our clinic review tool, if applicable. No additional management support is needed unless otherwise documented below in the visit note. 

## 2016-09-12 NOTE — Progress Notes (Signed)
Subjective:  Patient ID: Matthew Villanueva, male    DOB: June 30, 1935  Age: 81 y.o. MRN: [id number]  CC: Hypertension; Hyperlipidemia; and Diabetes   HPI Matthew Villanueva presents for f/up - he thinks metformin is causing diarrhea, for 5 weeks now he has had up to 5 loose BM's per day. His blood sugars have been well controlled. He offers no other complaints today.  Outpatient Medications Prior to Visit  Medication Sig Dispense Refill  . aspirin 81 MG tablet Take 81 mg by mouth daily.      . colchicine 0.6 MG tablet Take 1.2 mg by mouth daily as needed (gout). Take an additional 0.6 mg by mouth two (2) hours later if needed again for gout.    Marland Kitchen losartan-hydrochlorothiazide (HYZAAR) 100-25 MG tablet TAKE 1 TABLET BY MOUTH DAILY. 90 tablet 1  . Omega-3 Fatty Acids (FISH OIL) 1000 MG CPDR Take 1,000 mg by mouth daily.    . INVOKAMET [phone] MG TABS TAKE 1 TABLET BY MOUTH 2 (TWO) TIMES DAILY. 60 tablet 5  . Multiple Vitamin (MULTIVITAMIN) tablet Take 1 tablet by mouth daily. Reported on 05/04/2015     No facility-administered medications prior to visit.     ROS Review of Systems  Constitutional: Negative for activity change, appetite change, diaphoresis, fatigue and unexpected weight change.  HENT: Negative.  Negative for trouble swallowing.   Eyes: Negative for visual disturbance.  Respiratory: Negative for cough, chest tightness, shortness of breath and wheezing.   Cardiovascular: Negative for chest pain, palpitations and leg swelling.  Gastrointestinal: Positive for diarrhea. Negative for abdominal pain, anal bleeding, constipation, nausea and vomiting.  Endocrine: Negative.  Negative for cold intolerance, heat intolerance, polydipsia, polyphagia and polyuria.  Genitourinary: Negative.  Negative for difficulty urinating.  Musculoskeletal: Negative.  Negative for back pain and myalgias.  Skin: Negative.  Negative for color change and rash.  Allergic/Immunologic: Negative.     Neurological: Negative.  Negative for dizziness, weakness and light-headedness.  Hematological: Negative for adenopathy. Does not bruise/bleed easily.  Psychiatric/Behavioral: Negative.     Objective:  BP 128/72 (BP Location: Left Arm, Patient Position: Sitting, Cuff Size: Normal)   Pulse (!) 51   Temp 98.1 F (36.7 C) (Oral)   Resp 16   Ht 5\' 10"  (1.778 m)   Wt 256 lb (116.1 kg)   SpO2 96%   BMI 36.73 kg/m   BP Readings from Last 3 Encounters:  09/12/16 128/72  09/08/16 120/70  08/17/16 128/78    Wt Readings from Last 3 Encounters:  09/12/16 256 lb (116.1 kg)  09/08/16 255 lb (115.7 kg)  08/17/16 269 lb (122 kg)    Physical Exam  Constitutional: He is oriented to person, place, and time. No distress.  HENT:  Mouth/Throat: Oropharynx is clear and moist. No oropharyngeal exudate.  Eyes: Conjunctivae are normal. Right eye exhibits no discharge. Left eye exhibits no discharge. No scleral icterus.  Neck: Normal range of motion. Neck supple. No JVD present. No tracheal deviation present. No thyromegaly present.  Cardiovascular: Normal rate, regular rhythm, normal heart sounds and intact distal pulses.  Exam reveals no gallop and no friction rub.   No murmur heard. Pulmonary/Chest: Effort normal and breath sounds normal. No stridor. No respiratory distress. He has no wheezes. He has no rales. He exhibits no tenderness.  Abdominal: Soft. Bowel sounds are normal. He exhibits no distension and no mass. There is no tenderness. There is no rebound and no guarding.  Musculoskeletal: Normal range of motion.  He exhibits no edema, tenderness or deformity.  Lymphadenopathy:    He has no cervical adenopathy.  Neurological: He is oriented to person, place, and time.  Skin: Skin is warm and dry. No rash noted. He is not diaphoretic. No erythema. No pallor.  Vitals reviewed.   Lab Results  Component Value Date   WBC 9.3 09/12/2016   HGB 16.4 09/12/2016   HCT 48.6 09/12/2016   PLT  208.0 09/12/2016   GLUCOSE 168 (H) 09/12/2016   CHOL 155 09/12/2016   TRIG 347.0 (H) 09/12/2016   HDL 40.40 09/12/2016   LDLDIRECT 88.0 09/12/2016   LDLCALC 72 12/24/2013   ALT 26 09/12/2016   AST 21 09/12/2016   NA 139 09/12/2016   K 3.6 09/12/2016   CL 102 09/12/2016   CREATININE 1.03 09/12/2016   BUN 16 09/12/2016   CO2 30 09/12/2016   TSH 1.43 09/12/2016   PSA 2.87 10/19/2015   HGBA1C 6.8 09/12/2016   MICROALBUR 10.5 (H) 03/08/2016    Ct Angio Chest W/cm &/or Wo Cm  Result Date: 03/09/2016 CLINICAL DATA:  Shortness of breath with exertion EXAM: CT ANGIOGRAPHY CHEST WITH CONTRAST TECHNIQUE: Multidetector CT imaging of the chest was performed using the standard protocol during bolus administration of intravenous contrast. Multiplanar CT image reconstructions and MIPs were obtained to evaluate the vascular anatomy. CONTRAST:  80 mL Isovue 370. COMPARISON:  None. FINDINGS: Cardiovascular: Thoracic aorta demonstrates some calcific changes without definitive aneurysmal dilatation. Opacification is somewhat limited although no definitive dissection is seen. The pulmonary artery is well visualized and demonstrates a normal branching pattern. No filling defect to suggest pulmonary embolism is identified. Moderate coronary calcifications are seen. The cardiac structures appear within normal limits. Mediastinum/Nodes: Multiple calcified hilar and mediastinal lymph nodes are noted consistent with prior granulomatous disease. No significant lymphadenopathy is noted. The thoracic inlet shows no acute abnormality. No axillary adenopathy is seen. Lungs/Pleura: The lungs are well aerated bilaterally. Scattered subpleural nodular densities are noted within the right lung as well as the left upper lobe. The left upper lobe nodule measures approximately 7 mm. The largest of the right lung nodules measures approximately 7 mm lying with right middle lobe. No pneumothorax or sizable effusion is noted. Upper  Abdomen: No acute abnormality. Musculoskeletal: Degenerative changes of the thoracic spine are noted. Review of the MIP images confirms the above findings. IMPRESSION: Bilateral pulmonary nodules which are noncalcified in nature. Given the calcified lymph nodes in the hila and mediastinum these statistically represent noncalcified granulomas although Non-contrast chest CT at 3-6 months is recommended. If the nodules are stable at time of repeat CT, then future CT at 18-24 months (from today's scan) is considered optional for low-risk patients, but is recommended for high-risk patients. This recommendation follows the consensus statement: Guidelines for Management of Incidental Pulmonary Nodules Detected on CT Images: From the Fleischner Society 2017; Radiology 2017; 284:228-243. No evidence of pulmonary emboli. Changes of prior granulomatous disease. Electronically Signed   By: Inez Catalina M.D.   On: 03/09/2016 09:07    Assessment & Plan:   Samiel was seen today for hypertension, hyperlipidemia and diabetes.  Diagnoses and all orders for this visit:  Type 2 diabetes mellitus with diabetic nephropathy, without long-term current use of insulin ([place])- will discontinue metformin due to the diarrhea, his A1C is 6.8% - will continue the SGLT2-inhibitor -     Comprehensive metabolic panel; Future -     Cancel: Hemoglobin A1c; Future -     Ambulatory referral  to Ophthalmology -     empagliflozin (JARDIANCE) 25 MG TABS tablet; Take 25 mg by mouth daily. -     POCT glycosylated hemoglobin (Hb A1C)  Pure hyperglyceridemia- his trigs are still mildly elevated, he agrees to work on his lifestyle modifications -     Lipid panel; Future  Essential hypertension- his BP is well controlled, lytes and renal fxn are normal -     CBC with Differential/Platelet; Future -     Thyroid Panel With TSH; Future  Hyperlipidemia with target LDL less than 100- he has achieved his LDL goal and is doing well on the statin -      Lipid panel; Future -     Thyroid Panel With TSH; Future   I have discontinued Mr. Foushee multivitamin and INVOKAMET. I am also having him start on empagliflozin. Additionally, I am having him maintain his aspirin, Fish Oil, colchicine, and losartan-hydrochlorothiazide.  Meds ordered this encounter  Medications  . empagliflozin (JARDIANCE) 25 MG TABS tablet    Sig: Take 25 mg by mouth daily.    Dispense:  90 tablet    Refill:  1     Follow-up: Return in about 4 months (around 01/13/2017).  Scarlette Calico, MD

## 2016-09-12 NOTE — Patient Instructions (Signed)

## 2016-09-13 LAB — THYROID PANEL WITH TSH
Free Thyroxine Index: 2.1 (ref 1.4–3.8)
T3 Uptake: 31 % (ref 22–35)
T4 TOTAL: 6.8 ug/dL (ref 4.5–12.0)
TSH: 1.43 m[IU]/L (ref 0.40–4.50)

## 2016-09-18 ENCOUNTER — Other Ambulatory Visit: Payer: Self-pay | Admitting: Internal Medicine

## 2016-09-22 ENCOUNTER — Telehealth: Payer: Self-pay

## 2016-09-22 NOTE — Telephone Encounter (Signed)
Patient's MyChart is not active and I have no release information.

## 2016-09-22 NOTE — Telephone Encounter (Signed)
Can you release the lab results from 09/12/2016 please.

## 2016-09-25 NOTE — Telephone Encounter (Signed)
Kidney function, liver function, electrolytes, thyroid function and white/red blood cells are normal. His triglycerides remain significantly elevated about goal of 150 at 347. Would recommend a possible fenofibrate, however continue fish oil until Dr. Ronnald Ramp returns.

## 2016-09-25 NOTE — Telephone Encounter (Signed)
Can you tell me what the results mean?

## 2016-09-25 NOTE — Telephone Encounter (Signed)
Pt informed of results.   Defer hypertriglyceridemia treatment if any to PCP

## 2016-09-26 DIAGNOSIS — G4733 Obstructive sleep apnea (adult) (pediatric): Secondary | ICD-10-CM | POA: Diagnosis not present

## 2016-10-31 ENCOUNTER — Other Ambulatory Visit: Payer: Self-pay | Admitting: Internal Medicine

## 2016-10-31 DIAGNOSIS — Z794 Long term (current) use of insulin: Secondary | ICD-10-CM

## 2016-10-31 DIAGNOSIS — E1121 Type 2 diabetes mellitus with diabetic nephropathy: Secondary | ICD-10-CM

## 2016-10-31 MED ORDER — EMPAGLIFLOZIN 25 MG PO TABS: 25.0000 mg | ORAL_TABLET | Freq: Every day | ORAL | 1 refills | Status: DC

## 2016-11-07 DIAGNOSIS — E119 Type 2 diabetes mellitus without complications: Secondary | ICD-10-CM | POA: Diagnosis not present

## 2016-11-07 DIAGNOSIS — H2513 Age-related nuclear cataract, bilateral: Secondary | ICD-10-CM | POA: Diagnosis not present

## 2016-11-07 DIAGNOSIS — H25013 Cortical age-related cataract, bilateral: Secondary | ICD-10-CM | POA: Diagnosis not present

## 2016-11-07 DIAGNOSIS — H35033 Hypertensive retinopathy, bilateral: Secondary | ICD-10-CM | POA: Diagnosis not present

## 2016-11-07 LAB — HM DIABETES EYE EXAM

## 2016-11-09 ENCOUNTER — Encounter: Payer: Self-pay | Admitting: Internal Medicine

## 2016-11-28 NOTE — Progress Notes (Signed)
Pre visit review using our clinic review tool, if applicable. No additional management support is needed unless otherwise documented below in the visit note. 

## 2016-11-28 NOTE — Progress Notes (Addendum)
Subjective:   Matthew Villanueva is a 81 y.o. male who presents for Medicare Annual/Subsequent preventive examination.  Review of Systems:  No ROS.  Medicare Wellness Visit. Additional risk factors are reflected in the social history.  Cardiac Risk Factors include: advanced age (>44men, >54 women);diabetes mellitus;dyslipidemia;hypertension;male gender;sedentary lifestyle Sleep patterns: does not get up to void, gets up 2-3 times nightly to void and sleeps 6-7 hours nightly.    Home Safety/Smoke Alarms: Feels safe in home. Smoke alarms in place.  Living environment; residence and Firearm Safety: 2-story house, no firearms. Lives alone, no needs for DME, good support system  Seat Belt Safety/Bike Helmet: Wears seat belt.   Counseling:   Eye Exam- appointment yearly, Dr.[facility]- appointment every 6 months, Dr. Elige Radon   Male:   CCS- Last 04/14/15, polyp, no recall information, Advised to see Dr. Fuller Plan for GI issues or concerns. PSA-  Lab Results  Component Value Date   PSA 2.87 10/19/2015   PSA 1.88 08/12/2013   PSA 0.73 04/29/2010       Objective:    Vitals: BP 124/82   Pulse 77   Temp 97.9 F (36.6 C)   Resp 20   Ht 5\' 10"  (1.778 m)   Wt 258 lb (117 kg)   SpO2 99%   BMI 37.02 kg/m   Body mass index is 37.02 kg/m.  Tobacco History  Smoking Status  . Former Smoker  . Packs/day: 2.00  . Years: 30.00  . Types: Cigarettes  . Quit date: 05/15/1984  Smokeless Tobacco  . Never Used     Counseling given: Not Answered   Past Medical History:  Diagnosis Date  . Arthritis   . Diabetes mellitus    NO INSULIN  . Dyspnea   . Gout   . History of colonic polyps   . HTN (hypertension)   . Localized osteoarthrosis not specified whether primary or secondary, hand   . OSA (obstructive sleep apnea)   . Other specified cardiac dysrhythmias(427.89) 2004   ABLATION DUE TO TACHYCARDIA  . Right bundle branch block    Past Surgical History:  Procedure  Laterality Date  . CARDIAC ELECTROPHYSIOLOGY STUDY AND ABLATION  2004   ablation for SVT with a chronic right bundle branch block   . [place]  . PILONIDAL CYST DRAINAGE     Family History  Problem Relation Age of Onset  . Cancer Father        throat and tongue  . Heart disease Mother   . Kidney cancer Sister   . Diabetes Maternal Uncle   . Heart attack Maternal Grandmother   . Heart disease Maternal Grandmother        CAD/MI  . Heart disease Maternal Grandfather   . Heart disease Maternal Aunt        CAD   History  Sexual Activity  . Sexual activity: Not Currently    Outpatient Encounter Prescriptions as of 11/29/2016  Medication Sig  . aspirin 81 MG tablet Take 81 mg by mouth daily.    . colchicine 0.6 MG tablet Take 1.2 mg by mouth daily as needed (gout). Take an additional 0.6 mg by mouth two (2) hours later if needed again for gout.  . empagliflozin (JARDIANCE) 25 MG TABS tablet Take 25 mg by mouth daily.  Marland Kitchen losartan-hydrochlorothiazide (HYZAAR) 100-25 MG tablet TAKE ONE TABLET BY MOUTH DAILY  . Omega-3 Fatty Acids (FISH OIL) 1000 MG CPDR Take 1,000 mg by mouth daily.  Marland Kitchen  pregabalin (LYRICA) 50 MG capsule Take 1 capsule (50 mg total) by mouth 3 (three) times daily.   No facility-administered encounter medications on file as of 11/29/2016.     Activities of Daily Living In your present state of health, do you have any difficulty performing the following activities: 11/29/2016  Hearing? Y  Vision? N  Difficulty concentrating or making decisions? N  Walking or climbing stairs? N  Dressing or bathing? N  Doing errands, shopping? N  Preparing Food and eating ? N  Using the Toilet? N  In the past six months, have you accidently leaked urine? N  Do you have problems with loss of bowel control? N  Managing your Medications? N  Managing your Finances? N  Housekeeping or managing your Housekeeping? N  Some recent data might be hidden    Patient Care  Team: [name], MD as PCP - General (Internal Medicine) Chesley Mires, MD (Pulmonary Disease) Monna Fam, MD (Ophthalmology)   Assessment:    Physical assessment deferred to PCP.  Exercise Activities and Dietary recommendations Current Exercise Habits: The patient does not participate in regular exercise at present (chair exercise pamphlets provided), Exercise limited by: None identified  Diet (meal preparation, eat out, water intake, caffeinated beverages, dairy products, fruits and vegetables): in general, a "healthy" diet  , well balanced, eats a variety of fruits and vegetables daily, limits salt, fat/cholesterol, sugar, caffeine, drinks 6-8 glasses of water daily.  Reviewed heart healthy and diabetic diet and weight loss strategy tips.  Diet education was attached to patient's AVS.  Goals    . lose weight          Watch what I eat, start to exercise, walk in my neighborhood 15-20 minutes a day      Fall Risk Fall Risk  11/29/2016 08/17/2016 11/11/2015 11/11/2015 11/09/2015  Falls in the past year? No No No No No   Depression Screen PHQ 2/9 Scores 11/29/2016 08/17/2016 11/11/2015 11/11/2015  PHQ - 2 Score 0 0 0 0  PHQ- 9 Score 2 - - -    Cognitive Function       Ad8 score reviewed for issues:  Issues making decisions: no  Less interest in hobbies / activities: no  Repeats questions, stories (family complaining): no  Trouble using ordinary gadgets (microwave, computer, phone):no  Forgets the month or year: no  Mismanaging finances: no  Remembering appts: no  Daily problems with thinking and/or memory: no Ad8 score is= 0   Immunization History  Administered Date(s) Administered  . H1N1 04/21/2008  . Influenza Whole 04/01/2007, 02/21/2010, 02/13/2011, 03/18/2012  . Influenza, High Dose Seasonal PF 03/06/2016  . Influenza,inj,Quad PF,36+ Mos 01/20/2013, 05/04/2015  . Pneumococcal Conjugate-13 08/12/2013  . Pneumococcal Polysaccharide-23 03/07/2004  . Td  07/04/2004  . Tdap 05/04/2015  . Zoster 04/29/2010   Screening Tests Health Maintenance  Topic Date Due  . INFLUENZA VACCINE  12/13/2016  . HEMOGLOBIN A1C  03/15/2017  . FOOT EXAM  09/12/2017  . OPHTHALMOLOGY EXAM  11/07/2017  . TETANUS/TDAP  05/03/2025  . PNA vac Low Risk Adult  Completed      Plan:    Continue doing brain stimulating activities (puzzles, reading, adult coloring books, staying active) to keep memory sharp.   Continue to eat heart healthy diet (full of fruits, vegetables, whole grains, lean protein, water--limit salt, fat, and sugar intake) and increase physical activity as tolerated.  I have personally reviewed and noted the following in the patient's chart:   .  Medical and social history . Use of alcohol, tobacco or illicit drugs  . Current medications and supplements . Functional ability and status . Nutritional status . Physical activity . Advanced directives . List of other physicians . Vitals . Screenings to include cognitive, depression, and falls . Referrals and appointments  In addition, I have reviewed and discussed with patient certain preventive protocols, quality metrics, and best practice recommendations. A written personalized care plan for preventive services as well as general preventive health recommendations were provided to patient.     Michiel Cowboy, RN  11/29/2016    Medical screening examination/treatment/procedure(s) were performed by non-physician practitioner and as supervising physician I was immediately available for consultation/collaboration. I agree with above. Scarlette Calico, MD

## 2016-11-29 ENCOUNTER — Encounter: Payer: Self-pay | Admitting: Internal Medicine

## 2016-11-29 ENCOUNTER — Other Ambulatory Visit: Payer: Self-pay | Admitting: Internal Medicine

## 2016-11-29 ENCOUNTER — Ambulatory Visit (INDEPENDENT_AMBULATORY_CARE_PROVIDER_SITE_OTHER): Payer: Medicare Other | Admitting: Internal Medicine

## 2016-11-29 ENCOUNTER — Other Ambulatory Visit (INDEPENDENT_AMBULATORY_CARE_PROVIDER_SITE_OTHER): Payer: Medicare Other

## 2016-11-29 VITALS — BP 124/82 | HR 77 | Temp 97.9°F | Resp 16 | Ht 70.0 in | Wt 258.0 lb

## 2016-11-29 DIAGNOSIS — N401 Enlarged prostate with lower urinary tract symptoms: Secondary | ICD-10-CM

## 2016-11-29 DIAGNOSIS — N528 Other male erectile dysfunction: Secondary | ICD-10-CM

## 2016-11-29 DIAGNOSIS — Z0001 Encounter for general adult medical examination with abnormal findings: Secondary | ICD-10-CM | POA: Diagnosis not present

## 2016-11-29 DIAGNOSIS — I10 Essential (primary) hypertension: Secondary | ICD-10-CM

## 2016-11-29 DIAGNOSIS — E669 Obesity, unspecified: Secondary | ICD-10-CM | POA: Diagnosis not present

## 2016-11-29 DIAGNOSIS — E1140 Type 2 diabetes mellitus with diabetic neuropathy, unspecified: Secondary | ICD-10-CM

## 2016-11-29 DIAGNOSIS — R351 Nocturia: Secondary | ICD-10-CM

## 2016-11-29 DIAGNOSIS — Z0000 Encounter for general adult medical examination without abnormal findings: Secondary | ICD-10-CM

## 2016-11-29 DIAGNOSIS — E1121 Type 2 diabetes mellitus with diabetic nephropathy: Secondary | ICD-10-CM | POA: Diagnosis not present

## 2016-11-29 LAB — BASIC METABOLIC PANEL
BUN: 20 mg/dL (ref 6–23)
CALCIUM: 9.8 mg/dL (ref 8.4–10.5)
CHLORIDE: 100 meq/L (ref 96–112)
CO2: 27 meq/L (ref 19–32)
CREATININE: 1.07 mg/dL (ref 0.40–1.50)
GFR: 70.9 mL/min (ref >60.00)
GLUCOSE: 144 mg/dL — AB (ref 70–99)
Potassium: 4.2 mEq/L (ref 3.5–5.1)
Sodium: 135 mEq/L (ref 135–145)

## 2016-11-29 MED ORDER — PREGABALIN 50 MG PO CAPS: 50.0000 mg | ORAL_CAPSULE | Freq: Three times a day (TID) | ORAL | 0 refills | Status: DC

## 2016-11-29 NOTE — Progress Notes (Signed)
Subjective:  Patient ID: Matthew Villanueva, male    DOB: 04-02-1936  Age: 81 y.o. MRN: [id number]  CC: Medicare Wellness; Diabetes; and Hypertension   HPI Matthew Villanueva presents for f/up. He complains of worsening discomfort in both feet over the last year. He complains of intermittent tingling, and burning sensations around the bottoms and sides of his feet. He notices the symptoms only when he takes off his shoes and the symptoms do interfere with his sleep. He offers no other complaints today.  Outpatient Medications Prior to Visit  Medication Sig Dispense Refill  . aspirin 81 MG tablet Take 81 mg by mouth daily.      . colchicine 0.6 MG tablet Take 1.2 mg by mouth daily as needed (gout). Take an additional 0.6 mg by mouth two (2) hours later if needed again for gout.    . empagliflozin (JARDIANCE) 25 MG TABS tablet Take 25 mg by mouth daily. 90 tablet 1  . losartan-hydrochlorothiazide (HYZAAR) 100-25 MG tablet TAKE ONE TABLET BY MOUTH DAILY 90 tablet 1  . Omega-3 Fatty Acids (FISH OIL) 1000 MG CPDR Take 1,000 mg by mouth daily.     No facility-administered medications prior to visit.     ROS Review of Systems  Constitutional: Negative.  Negative for diaphoresis, fatigue and unexpected weight change.  HENT: Negative.   Eyes: Negative.  Negative for visual disturbance.  Respiratory: Negative for cough, chest tightness, shortness of breath and wheezing.   Cardiovascular: Negative for chest pain, palpitations and leg swelling.  Gastrointestinal: Negative for abdominal pain, constipation, diarrhea, nausea and vomiting.  Endocrine: Negative.  Negative for polydipsia, polyphagia and polyuria.  Genitourinary: Negative.  Negative for difficulty urinating, frequency and urgency.  Musculoskeletal: Negative.  Negative for arthralgias, back pain, myalgias and neck pain.  Skin: Negative.  Negative for color change and rash.  Allergic/Immunologic: Negative.   Neurological: Positive  for numbness. Negative for dizziness and weakness.  Hematological: Negative for adenopathy. Does not bruise/bleed easily.  Psychiatric/Behavioral: Negative.     Objective:  BP 124/82   Pulse 77   Temp 97.9 F (36.6 C)   Resp 20   Ht 5\' 10"  (1.778 m)   Wt 258 lb (117 kg)   SpO2 99%   BMI 37.02 kg/m   BP Readings from Last 3 Encounters:  11/29/16 124/82  09/12/16 128/72  09/08/16 120/70    Wt Readings from Last 3 Encounters:  11/29/16 258 lb (117 kg)  09/12/16 256 lb (116.1 kg)  09/08/16 255 lb (115.7 kg)    Physical Exam  Constitutional: He is oriented to person, place, and time. No distress.  HENT:  Mouth/Throat: Oropharynx is clear and moist. No oropharyngeal exudate.  Eyes: Conjunctivae are normal. Right eye exhibits no discharge. Left eye exhibits no discharge. No scleral icterus.  Neck: Normal range of motion. Neck supple. No JVD present. No thyromegaly present.  Cardiovascular: Normal rate, regular rhythm and intact distal pulses.  Exam reveals no gallop and no friction rub.   No murmur heard. Pulmonary/Chest: Effort normal and breath sounds normal. No respiratory distress. He has no wheezes. He has no rales. He exhibits no tenderness.  Abdominal: Soft. Bowel sounds are normal. He exhibits no distension and no mass. There is no tenderness. There is no rebound and no guarding.  Musculoskeletal: Normal range of motion. He exhibits no edema, tenderness or deformity.  Lymphadenopathy:    He has no cervical adenopathy.  Neurological: He is alert and oriented to person, place,  and time. He displays abnormal reflex. He displays no atrophy and no tremor. No cranial nerve deficit or sensory deficit. He exhibits normal muscle tone. He displays a negative Romberg sign. He displays no seizure activity. Coordination and gait normal.  Reflex Scores:      Tricep reflexes are 1+ on the right side and 1+ on the left side.      Bicep reflexes are 1+ on the right side and 1+ on the  left side.      Brachioradialis reflexes are 1+ on the right side and 1+ on the left side.      Patellar reflexes are 0 on the right side and 0 on the left side.      Achilles reflexes are 0 on the right side and 0 on the left side. Skin: Skin is warm and dry. No rash noted. He is not diaphoretic. No erythema. No pallor.  Vitals reviewed.   Lab Results  Component Value Date   WBC 9.3 09/12/2016   HGB 16.4 09/12/2016   HCT 48.6 09/12/2016   PLT 208.0 09/12/2016   GLUCOSE 144 (H) 11/29/2016   CHOL 155 09/12/2016   TRIG 347.0 (H) 09/12/2016   HDL 40.40 09/12/2016   LDLDIRECT 88.0 09/12/2016   LDLCALC 72 12/24/2013   ALT 26 09/12/2016   AST 21 09/12/2016   NA 135 11/29/2016   K 4.2 11/29/2016   CL 100 11/29/2016   CREATININE 1.07 11/29/2016   BUN 20 11/29/2016   CO2 27 11/29/2016   TSH 1.43 09/12/2016   PSA 2.87 10/19/2015   HGBA1C 6.8 09/12/2016   MICROALBUR 10.5 (H) 03/08/2016    Ct Angio Chest W/cm &/or Wo Cm  Result Date: 03/09/2016 CLINICAL DATA:  Shortness of breath with exertion EXAM: CT ANGIOGRAPHY CHEST WITH CONTRAST TECHNIQUE: Multidetector CT imaging of the chest was performed using the standard protocol during bolus administration of intravenous contrast. Multiplanar CT image reconstructions and MIPs were obtained to evaluate the vascular anatomy. CONTRAST:  80 mL Isovue 370. COMPARISON:  None. FINDINGS: Cardiovascular: Thoracic aorta demonstrates some calcific changes without definitive aneurysmal dilatation. Opacification is somewhat limited although no definitive dissection is seen. The pulmonary artery is well visualized and demonstrates a normal branching pattern. No filling defect to suggest pulmonary embolism is identified. Moderate coronary calcifications are seen. The cardiac structures appear within normal limits. Mediastinum/Nodes: Multiple calcified hilar and mediastinal lymph nodes are noted consistent with prior granulomatous disease. No significant  lymphadenopathy is noted. The thoracic inlet shows no acute abnormality. No axillary adenopathy is seen. Lungs/Pleura: The lungs are well aerated bilaterally. Scattered subpleural nodular densities are noted within the right lung as well as the left upper lobe. The left upper lobe nodule measures approximately 7 mm. The largest of the right lung nodules measures approximately 7 mm lying with right middle lobe. No pneumothorax or sizable effusion is noted. Upper Abdomen: No acute abnormality. Musculoskeletal: Degenerative changes of the thoracic spine are noted. Review of the MIP images confirms the above findings. IMPRESSION: Bilateral pulmonary nodules which are noncalcified in nature. Given the calcified lymph nodes in the hila and mediastinum these statistically represent noncalcified granulomas although Non-contrast chest CT at 3-6 months is recommended. If the nodules are stable at time of repeat CT, then future CT at 18-24 months (from today's scan) is considered optional for low-risk patients, but is recommended for high-risk patients. This recommendation follows the consensus statement: Guidelines for Management of Incidental Pulmonary Nodules Detected on CT Images: From the Fleischner  Society 2017; Radiology 2017; [phone]. No evidence of pulmonary emboli. Changes of prior granulomatous disease. Electronically Signed   By: Inez Catalina M.D.   On: 03/09/2016 09:07    Assessment & Plan:   Reyli was seen today for medicare wellness, diabetes and hypertension.  Diagnoses and all orders for this visit:  Obesity, Class II, BMI 35-39.9-  he agrees to work on his lifestyle modifications to lose weight  BPH associated with nocturia- he has no symptoms related to this and since he is above the age of 89 I don't think it's wrothwhile to recheck his PSA -     Cancel: PSA; Future  Essential hypertension- his blood pressure is well controlled, electrolytes and renal function are normal -     Basic  metabolic panel; Future  Type 2 diabetes mellitus with diabetic nephropathy, without long-term current use of insulin ([place])- about 2 months ago his A1c showed that his blood sugars are well-controlled. Today, it's too early to recheck his A1c. -     Basic metabolic panel; Future  Chronic painful diabetic neuropathy ([place])- will start treating this with Lyrica. Will start a low dose and gradually increase the dose over time if needed. -     pregabalin (LYRICA) 50 MG capsule; Take 1 capsule (50 mg total) by mouth 3 (three) times daily.  Encounter for Medicare annual wellness exam   I am having Mr. Hippe start on pregabalin. I am also having him maintain his aspirin, Fish Oil, colchicine, losartan-hydrochlorothiazide, and empagliflozin.  Meds ordered this encounter  Medications  . pregabalin (LYRICA) 50 MG capsule    Sig: Take 1 capsule (50 mg total) by mouth 3 (three) times daily.    Dispense:  84 capsule    Refill:  0     Follow-up: Return in about 3 months (around 03/01/2017).  Scarlette Calico, MD

## 2016-11-29 NOTE — Patient Instructions (Signed)
Continue doing brain stimulating activities (puzzles, reading, adult coloring books, staying active) to keep memory sharp.   Continue to eat heart healthy diet (full of fruits, vegetables, whole grains, lean protein, water--limit salt, fat, and sugar intake) and increase physical activity as tolerated.   Matthew Villanueva , Thank you for taking time to come for your Medicare Wellness Visit. I appreciate your ongoing commitment to your health goals. Please review the following plan we discussed and let me know if I can assist you in the future.   These are the goals we discussed: Goals    . lose weight          Watch what I eat, start to exercise, walk in my neighborhood 15-20 minutes a day       This is a list of the screening recommended for you and due dates:  Health Maintenance  Topic Date Due  . Flu Shot  12/13/2016  . Hemoglobin A1C  03/15/2017  . Complete foot exam   09/12/2017  . Eye exam for diabetics  11/07/2017  . Tetanus Vaccine  05/03/2025  . Pneumonia vaccines  Completed    Carbohydrate Counting for Diabetes Mellitus, Adult Carbohydrate counting is a method for keeping track of how many carbohydrates you eat. Eating carbohydrates naturally increases the amount of sugar (glucose) in the blood. Counting how many carbohydrates you eat helps keep your blood glucose within normal limits, which helps you manage your diabetes (diabetes mellitus). It is important to know how many carbohydrates you can safely have in each meal. This is different for every person. A diet and nutrition specialist (registered dietitian) can help you make a meal plan and calculate how many carbohydrates you should have at each meal and snack. Carbohydrates are found in the following foods:  Grains, such as breads and cereals.  Dried beans and soy products.  Starchy vegetables, such as potatoes, peas, and corn.  Fruit and fruit juices.  Milk and yogurt.  Sweets and snack foods, such as cake,  cookies, candy, chips, and soft drinks.  How do I count carbohydrates? There are two ways to count carbohydrates in food. You can use either of the methods or a combination of both. Reading "Nutrition Facts" on packaged food The "Nutrition Facts" list is included on the labels of almost all packaged foods and beverages in the [place] It includes:  The serving size.  Information about nutrients in each serving, including the grams (g) of carbohydrate per serving.  To use the "Nutrition Facts":  Decide how many servings you will have.  Multiply the number of servings by the number of carbohydrates per serving.  The resulting number is the total amount of carbohydrates that you will be having.  Learning standard serving sizes of other foods When you eat foods containing carbohydrates that are not packaged or do not include "Nutrition Facts" on the label, you need to measure the servings in order to count the amount of carbohydrates:  Measure the foods that you will eat with a food scale or measuring cup, if needed.  Decide how many standard-size servings you will eat.  Multiply the number of servings by 15. Most carbohydrate-rich foods have about 15 g of carbohydrates per serving. ? For example, if you eat 8 oz (170 g) of strawberries, you will have eaten 2 servings and 30 g of carbohydrates (2 servings x 15 g = 30 g).  For foods that have more than one food mixed, such as soups and casseroles, you  must count the carbohydrates in each food that is included.  The following list contains standard serving sizes of common carbohydrate-rich foods. Each of these servings has about 15 g of carbohydrates:   hamburger bun or  English muffin.   oz (15 mL) syrup.   oz (14 g) jelly.  1 slice of bread.  1 six-inch tortilla.  3 oz (85 g) cooked rice or pasta.  4 oz (113 g) cooked dried beans.  4 oz (113 g) starchy vegetable, such as peas, corn, or potatoes.  4 oz (113 g) hot  cereal.  4 oz (113 g) mashed potatoes or  of a large baked potato.  4 oz (113 g) canned or frozen fruit.  4 oz (120 mL) fruit juice.  4-6 crackers.  6 chicken nuggets.  6 oz (170 g) unsweetened dry cereal.  6 oz (170 g) plain fat-free yogurt or yogurt sweetened with artificial sweeteners.  8 oz (240 mL) milk.  8 oz (170 g) fresh fruit or one small piece of fruit.  24 oz (680 g) popped popcorn.  Example of carbohydrate counting Sample meal  3 oz (85 g) chicken breast.  6 oz (170 g) brown rice.  4 oz (113 g) corn.  8 oz (240 mL) milk.  8 oz (170 g) strawberries with sugar-free whipped topping. Carbohydrate calculation 1. Identify the foods that contain carbohydrates: ? Rice. ? Corn. ? Milk. ? Strawberries. 2. Calculate how many servings you have of each food: ? 2 servings rice. ? 1 serving corn. ? 1 serving milk. ? 1 serving strawberries. 3. Multiply each number of servings by 15 g: ? 2 servings rice x 15 g = 30 g. ? 1 serving corn x 15 g = 15 g. ? 1 serving milk x 15 g = 15 g. ? 1 serving strawberries x 15 g = 15 g. 4. Add together all of the amounts to find the total grams of carbohydrates eaten: ? 30 g + 15 g + 15 g + 15 g = 75 g of carbohydrates total. This information is not intended to replace advice given to you by your health care provider. Make sure you discuss any questions you have with your health care provider. Document Released: 05/01/2005 Document Revised: 11/19/2015 Document Reviewed: 10/13/2015 Elsevier Interactive Patient Education  2018 [place] Foods that are packaged or in containers have a Nutrition Facts panel on the side or back. This is commonly called the food label. The food label helps you make healthy food choices by providing information about serving size and the amount of calories and various nutrients in the food. You can check the food label to find out if the food contains high or low amounts of  nutrients that you want to limit in your diet. You can also use the food label to see if the food is a good source of the nutrients that you want to make sure are included in your diet. How do I read the food label?  Begin by checking the serving size and number of servings in the container. All of the nutrition information listed on the food label is based on one serving. If you eat more than one serving, you must multiply the amounts (such as calories, grams of saturated fat, or milligrams of sodium) by the number of servings.  Check the calories. Choosing foods that are low in calories can help you manage your weight.  Look at the numbers in the % Daily Value  column for each listed nutrient. This gives you an idea of how much of the daily recommended amount for that nutrient is provided in one serving of the food. A daily value of 5% or less is considered low. A daily value of 20% or more is considered high.  Check the amounts for the items you should limit in your diet. These include: ? Total fat. ? Saturated fat. ? Trans fat. ? Cholesterol. ? Sodium.  Check the amounts for the items you should make sure you get enough of. These include: ? Dietary fiber. ? Vitamins A and C. ? Calcium. ? Iron. What information is provided on the food label? Serving information  Serving size. ? The serving size is listed in cups or pieces. The nutrient amounts listed on the food label apply to this amount of the food.  Servings per container or package. ? This shows the number of servings you can expect to get from the container or package if you follow the suggested serving size. Amount per serving  Calories. ? The number of calories in one serving of the food. This information is helpful in managing weight. Low-calorie foods contain 40 calories or less. High-calorie foods contain 400 or more calories.  Calories from fat. ? The number of calories that come from fat in one serving. Percent daily  value Percent daily value (shown on the label as % Daily Value) tells you what percent of the daily value for each nutrient one serving provides. The daily value is the recommended amount of the nutrient that you should get each day. For example, if 15% is listed next to dietary fiber, it means that one serving of the food will give you 15% of the recommended amount of fiber that you should get in a day. The daily values are based on a 2,000-calorie-per-day diet. You may get more or less than 2,000 calories in your diet each day, but the % Daily Value gives you an idea of whether the food contains a high or low amount of the listed nutrient. A daily value of 5% or less is low. A daily value of 20% or more is high. Total fat Total fat shows you the total amount of fat in one serving (listed in grams). Foods with high amounts of fat usually have higher calories and may lead to weight gain. Two of the fats that make up a portion of the total fat are included on the label:  Saturated fat. ? This number is the amount of saturated fat in one serving (listed in grams). Saturated fat increases the amount of blood cholesterol and should be limited to less than 7% of total calories each day. This means that if you eat 2,000 calories each day, you should eat less than 140 calories from saturated fat.    Cholesterol The amount of cholesterol in one serving is listed in milligrams. Cholesterol should be limited to no more than 300 mg each day. Sodium The amount of sodium in one serving is listed in milligrams. Most people should limit their sodium intake to 2,300 mg a day. Total carbohydrate This number shows the amount of total carbohydrate in one serving (listed in grams). This information can help people with diabetes manage the amount of carbohydrate they eat. Two of the carbohydrates that make up a portion of the total carbohydrate are included on the label:  Dietary fiber. ? The amount of dietary fiber in  one serving is listed in grams. Most people should  eat at least 25 g of dietary fiber each day.  Sugars. ? The amount of sugar in one serving is also listed in grams. This value includes both naturally occurring sugars from fruit and milk and added sugars such as honey or table sugar.  Protein The amount of protein in one serving is listed in grams. What other important labeling is on the food package? Ingredients Food labels will list each ingredient in the food. The first ingredient listed is the ingredient that the food has the most of. The ingredients are listed in the order of their amount by weight from highest to lowest. Food allergen labeling Food labels may also include a food allergen warning. Listed here are ingredients that can cause allergic reactions in some people. The potential allergens are listed behind the word "Contains" or "May contain." Examples of ingredients that may be listed are wheat, dairy, eggs, soy, and nuts. If a person knows that he or she is allergic to one of these ingredients, he or she will know to avoid that food. Where to find more information:  [place] Food and Drug Administration: [place] This information is not intended to replace advice given to you by your health care provider. Make sure you discuss any questions you have with your health care provider. Document Released: 05/01/2005 Document Revised: 12/29/2015 Document Reviewed: 03/24/2013 Elsevier Interactive Patient Education  2017 [employer] Diabetes Mellitus and Food It is important for you to manage your blood sugar (glucose) level. Your blood glucose level can be greatly affected by what you eat. Eating healthier foods in the appropriate amounts throughout the day at about the same time each day will help you control your blood glucose level. It can also help slow or prevent worsening of your diabetes mellitus. Healthy eating may even help you improve the level of your blood pressure and reach  or maintain a healthy weight. General recommendations for healthful eating and cooking habits include:  Eating meals and snacks regularly. Avoid going long periods of time without eating to lose weight.  Eating a diet that consists mainly of plant-based foods, such as fruits, vegetables, nuts, legumes, and whole grains.  Using low-heat cooking methods, such as baking, instead of high-heat cooking methods, such as deep frying.  Work with your dietitian to make sure you understand how to use the Nutrition Facts information on food labels. How can food affect me? Carbohydrates Carbohydrates affect your blood glucose level more than any other type of food. Your dietitian will help you determine how many carbohydrates to eat at each meal and teach you how to count carbohydrates. Counting carbohydrates is important to keep your blood glucose at a healthy level, especially if you are using insulin or taking certain medicines for diabetes mellitus. Alcohol Alcohol can cause sudden decreases in blood glucose (hypoglycemia), especially if you use insulin or take certain medicines for diabetes mellitus. Hypoglycemia can be a life-threatening condition. Symptoms of hypoglycemia (sleepiness, dizziness, and disorientation) are similar to symptoms of having too much alcohol. If your health care provider has given you approval to drink alcohol, do so in moderation and use the following guidelines:  Women should not have more than one drink per day, and men should not have more than two drinks per day. One drink is equal to: ? 12 oz of beer. ? 5 oz of Phylliss Strege. ? 1 oz of hard liquor.  Do not drink on an empty stomach.  Keep yourself hydrated. Have water, diet soda, or unsweetened iced  tea.  Regular soda, juice, and other mixers might contain a lot of carbohydrates and should be counted.  What foods are not recommended? As you make food choices, it is important to remember that all foods are not the same.  Some foods have fewer nutrients per serving than other foods, even though they might have the same number of calories or carbohydrates. It is difficult to get your body what it needs when you eat foods with fewer nutrients. Examples of foods that you should avoid that are high in calories and carbohydrates but low in nutrients include:  Trans fats (most processed foods list trans fats on the Nutrition Facts label).  Regular soda.  Juice.  Candy.  Sweets, such as cake, pie, doughnuts, and cookies.  Fried foods.  What foods can I eat? Eat nutrient-rich foods, which will nourish your body and keep you healthy. The food you should eat also will depend on several factors, including:  The calories you need.  The medicines you take.  Your weight.  Your blood glucose level.  Your blood pressure level.  Your cholesterol level.  You should eat a variety of foods, including:  Protein. ? Lean cuts of meat. ? Proteins low in saturated fats, such as fish, egg whites, and beans. Avoid processed meats.  Fruits and vegetables. ? Fruits and vegetables that may help control blood glucose levels, such as apples, mangoes, and yams.  Dairy products. ? Choose fat-free or low-fat dairy products, such as milk, yogurt, and cheese.  Grains, bread, pasta, and rice. ? Choose whole grain products, such as multigrain bread, whole oats, and brown rice. These foods may help control blood pressure.  Fats. ? Foods containing healthful fats, such as nuts, avocado, olive oil, canola oil, and fish.  Does everyone with diabetes mellitus have the same meal plan? Because every person with diabetes mellitus is different, there is not one meal plan that works for everyone. It is very important that you meet with a dietitian who will help you create a meal plan that is just right for you. This information is not intended to replace advice given to you by your health care provider. Make sure you discuss any  questions you have with your health care provider. Document Released: 01/26/2005 Document Revised: 10/07/2015 Document Reviewed: 03/28/2013 Elsevier Interactive Patient Education  2017 [employer].

## 2016-11-30 ENCOUNTER — Encounter: Payer: Self-pay | Admitting: Internal Medicine

## 2016-12-02 ENCOUNTER — Encounter: Payer: Self-pay | Admitting: Internal Medicine

## 2016-12-05 ENCOUNTER — Encounter: Payer: Self-pay | Admitting: Internal Medicine

## 2017-01-10 ENCOUNTER — Ambulatory Visit (INDEPENDENT_AMBULATORY_CARE_PROVIDER_SITE_OTHER): Payer: Medicare Other | Admitting: Internal Medicine

## 2017-01-10 ENCOUNTER — Encounter: Payer: Self-pay | Admitting: Internal Medicine

## 2017-01-10 VITALS — BP 138/70 | HR 70 | Temp 98.3°F | Resp 16 | Ht 70.0 in | Wt 256.0 lb

## 2017-01-10 DIAGNOSIS — E1140 Type 2 diabetes mellitus with diabetic neuropathy, unspecified: Secondary | ICD-10-CM

## 2017-01-10 DIAGNOSIS — H8113 Benign paroxysmal vertigo, bilateral: Secondary | ICD-10-CM

## 2017-01-10 DIAGNOSIS — Z23 Encounter for immunization: Secondary | ICD-10-CM

## 2017-01-10 MED ORDER — PREGABALIN 100 MG PO CAPS: 100.0000 mg | ORAL_CAPSULE | Freq: Three times a day (TID) | ORAL | 5 refills | Status: DC

## 2017-01-10 MED ORDER — MECLIZINE HCL 12.5 MG PO TABS: 12.5000 mg | ORAL_TABLET | Freq: Three times a day (TID) | ORAL | 0 refills | Status: DC | PRN

## 2017-01-10 NOTE — Patient Instructions (Signed)
Vertigo Vertigo is the feeling that you or your surroundings are moving when they are not. Vertigo can be dangerous if it occurs while you are doing something that could endanger you or others, such as driving. What are the causes? This condition is caused by a disturbance in the signals that are sent by your body's sensory systems to your brain. Different causes of a disturbance can lead to vertigo, including:  Infections, especially in the inner ear.  A bad reaction to a drug, or misuse of alcohol and medicines.  Withdrawal from drugs or alcohol.  Quickly changing positions, as when lying down or rolling over in bed.  Migraine headaches.  Decreased blood flow to the brain.  Decreased blood pressure.  Increased pressure in the brain from a head or neck injury, stroke, infection, tumor, or bleeding.  Central nervous system disorders.  What are the signs or symptoms? Symptoms of this condition usually occur when you move your head or your eyes in different directions. Symptoms may start suddenly, and they usually last for less than a minute. Symptoms may include:  Loss of balance and falling.  Feeling like you are spinning or moving.  Feeling like your surroundings are spinning or moving.  Nausea and vomiting.  Blurred vision or double vision.  Difficulty hearing.  Slurred speech.  Dizziness.  Involuntary eye movement (nystagmus).  Symptoms can be mild and cause only slight annoyance, or they can be severe and interfere with daily life. Episodes of vertigo may return (recur) over time, and they are often triggered by certain movements. Symptoms may improve over time. How is this diagnosed? This condition may be diagnosed based on medical history and the quality of your nystagmus. Your health care provider may test your eye movements by asking you to quickly change positions to trigger the nystagmus. This may be called the Dix-Hallpike test, head thrust test, or roll test.  You may be referred to a health care provider who specializes in ear, nose, and throat (ENT) problems (otolaryngologist) or a provider who specializes in disorders of the central nervous system (neurologist). You may have additional testing, including:  A physical exam.  Blood tests.  MRI.  A CT scan.  An electrocardiogram (ECG). This records electrical activity in your heart.  An electroencephalogram (EEG). This records electrical activity in your brain.  Hearing tests.  How is this treated? Treatment for this condition depends on the cause and the severity of the symptoms. Treatment options include:  Medicines to treat nausea or vertigo. These are usually used for severe cases. Some medicines that are used to treat other conditions may also reduce or eliminate vertigo symptoms. These include: ? Medicines that control allergies (antihistamines). ? Medicines that control seizures (anticonvulsants). ? Medicines that relieve depression (antidepressants). ? Medicines that relieve anxiety (sedatives).  Head movements to adjust your inner ear back to normal. If your vertigo is caused by an ear problem, your health care provider may recommend certain movements to correct the problem.  Surgery. This is rare.  Follow these instructions at home: Safety  Move slowly.Avoid sudden body or head movements.  Avoid driving.  Avoid operating heavy machinery.  Avoid doing any tasks that would cause danger to you or others if you would have a vertigo episode during the task.  If you have trouble walking or keeping your balance, try using a cane for stability. If you feel dizzy or unstable, sit down right away.  Return to your normal activities as told by your   health care provider. Ask your health care provider what activities are safe for you. General instructions  Take over-the-counter and prescription medicines only as told by your health care provider.  Avoid certain positions or  movements as told by your health care provider.  Drink enough fluid to keep your urine clear or pale yellow.  Keep all follow-up visits as told by your health care provider. This is important. Contact a health care provider if:  Your medicines do not relieve your vertigo or they make it worse.  You have a fever.  Your condition gets worse or you develop new symptoms.  Your family or friends notice any behavioral changes.  Your nausea or vomiting gets worse.  You have numbness or a "pins and needles" sensation in part of your body. Get help right away if:  You have difficulty moving or speaking.  You are always dizzy.  You faint.  You develop severe headaches.  You have weakness in your hands, arms, or legs.  You have changes in your hearing or vision.  You develop a stiff neck.  You develop sensitivity to light. This information is not intended to replace advice given to you by your health care provider. Make sure you discuss any questions you have with your health care provider. Document Released: 02/08/2005 Document Revised: 10/13/2015 Document Reviewed: 08/24/2014 Elsevier Interactive Patient Education  2017 [employer]  

## 2017-01-12 NOTE — Progress Notes (Signed)
Subjective:  Patient ID: Matthew Villanueva, male    DOB: 1936-01-26  Age: 81 y.o. MRN: [id number]  CC: Dizziness   HPI Matthew Villanueva presents for a 3 week history of intermittent dizziness, vertigo, and feeling off balance. He says the symptoms started when he rolled over in the bed. He is had a mild diffuse headache but he denies visual disturbance, changes in his hearing, nausea, vomiting, numbness, weakness, or tingling. He also complains of persistent discomfort in the bottoms of his feet. He tried the 50 mg dose of Lyrica but says it wasn't very beneficial. He describes the discomfort as a crusty sensation on the bottom of his feet when he takes his shoes off.  Outpatient Medications Prior to Visit  Medication Sig Dispense Refill  . aspirin 81 MG tablet Take 81 mg by mouth daily.      . colchicine 0.6 MG tablet Take 1.2 mg by mouth daily as needed (gout). Take an additional 0.6 mg by mouth two (2) hours later if needed again for gout.    . empagliflozin (JARDIANCE) 25 MG TABS tablet Take 25 mg by mouth daily. 90 tablet 1  . losartan-hydrochlorothiazide (HYZAAR) 100-25 MG tablet TAKE ONE TABLET BY MOUTH DAILY 90 tablet 1  . Omega-3 Fatty Acids (FISH OIL) 1000 MG CPDR Take 1,000 mg by mouth daily.    . sildenafil (REVATIO) 20 MG tablet TAKE ONE TABLET BY MOUTH DAILY AS NEEDED 10 tablet 10  . pregabalin (LYRICA) 50 MG capsule Take 1 capsule (50 mg total) by mouth 3 (three) times daily. (Patient not taking: Reported on 01/10/2017) 84 capsule 0   No facility-administered medications prior to visit.     ROS Review of Systems  Constitutional: Negative.  Negative for chills, diaphoresis, fatigue and fever.  HENT: Negative.   Eyes: Negative for photophobia and visual disturbance.  Respiratory: Negative.  Negative for cough, chest tightness, shortness of breath and wheezing.   Cardiovascular: Negative for chest pain, palpitations and leg swelling.  Gastrointestinal: Negative for  abdominal pain, diarrhea, nausea and vomiting.  Endocrine: Negative.   Genitourinary: Negative.  Negative for difficulty urinating.  Musculoskeletal: Negative for back pain, gait problem, myalgias, neck pain and neck stiffness.  Skin: Negative.  Negative for color change and rash.  Neurological: Positive for dizziness and headaches. Negative for tremors, seizures, syncope, facial asymmetry, speech difficulty, weakness, light-headedness and numbness.  Hematological: Negative for adenopathy. Does not bruise/bleed easily.  Psychiatric/Behavioral: Negative.     Objective:  BP 138/70 (BP Location: Left Arm, Patient Position: Sitting, Cuff Size: Normal)   Pulse 70   Temp 98.3 F (36.8 C) (Oral)   Resp 16   Ht 5\' 10"  (1.778 m)   Wt 256 lb (116.1 kg)   SpO2 96%   BMI 36.73 kg/m   BP Readings from Last 3 Encounters:  01/10/17 138/70  11/29/16 124/82  09/12/16 128/72    Wt Readings from Last 3 Encounters:  01/10/17 256 lb (116.1 kg)  11/29/16 258 lb (117 kg)  09/12/16 256 lb (116.1 kg)    Physical Exam  Constitutional: He is oriented to person, place, and time. No distress.  HENT:  Head: Normocephalic and atraumatic.  Eyes: Pupils are equal, round, and reactive to light. Conjunctivae and EOM are normal. Right eye exhibits no discharge. Left eye exhibits no discharge. No scleral icterus.  Neck: Normal range of motion. Neck supple. No JVD present. No thyromegaly present.  Cardiovascular: Normal rate, regular rhythm and intact distal pulses.  Exam reveals no gallop and no friction rub.   No murmur heard. Pulmonary/Chest: Effort normal and breath sounds normal. No respiratory distress. He has no wheezes. He has no rales. He exhibits no tenderness.  Abdominal: Soft. Bowel sounds are normal. He exhibits no distension and no mass. There is no tenderness. There is no rebound and no guarding.  Musculoskeletal: Normal range of motion. He exhibits no edema, tenderness or deformity.    Lymphadenopathy:    He has no cervical adenopathy.  Neurological: He is alert and oriented to person, place, and time. He displays no atrophy, no tremor and normal reflexes. No cranial nerve deficit or sensory deficit. He exhibits normal muscle tone. He displays no seizure activity. Coordination and gait normal. He displays no Babinski's sign on the right side. He displays no Babinski's sign on the left side.  Reflex Scores:      Tricep reflexes are 0 on the right side and 0 on the left side.      Bicep reflexes are 0 on the right side and 0 on the left side.      Brachioradialis reflexes are 0 on the right side and 0 on the left side.      Patellar reflexes are 0 on the right side and 0 on the left side.      Achilles reflexes are 0 on the right side and 0 on the left side. Skin: Skin is warm and dry. No rash noted. He is not diaphoretic. No erythema. No pallor.  Psychiatric: He has a normal mood and affect. His behavior is normal. Judgment and thought content normal.  Vitals reviewed.   Lab Results  Component Value Date   WBC 9.3 09/12/2016   HGB 16.4 09/12/2016   HCT 48.6 09/12/2016   PLT 208.0 09/12/2016   GLUCOSE 144 (H) 11/29/2016   CHOL 155 09/12/2016   TRIG 347.0 (H) 09/12/2016   HDL 40.40 09/12/2016   LDLDIRECT 88.0 09/12/2016   LDLCALC 72 12/24/2013   ALT 26 09/12/2016   AST 21 09/12/2016   NA 135 11/29/2016   K 4.2 11/29/2016   CL 100 11/29/2016   CREATININE 1.07 11/29/2016   BUN 20 11/29/2016   CO2 27 11/29/2016   TSH 1.43 09/12/2016   PSA 2.87 10/19/2015   HGBA1C 6.8 09/12/2016   MICROALBUR 10.5 (H) 03/08/2016    Ct Angio Chest W/cm &/or Wo Cm  Result Date: 03/09/2016 CLINICAL DATA:  Shortness of breath with exertion EXAM: CT ANGIOGRAPHY CHEST WITH CONTRAST TECHNIQUE: Multidetector CT imaging of the chest was performed using the standard protocol during bolus administration of intravenous contrast. Multiplanar CT image reconstructions and MIPs were obtained  to evaluate the vascular anatomy. CONTRAST:  80 mL Isovue 370. COMPARISON:  None. FINDINGS: Cardiovascular: Thoracic aorta demonstrates some calcific changes without definitive aneurysmal dilatation. Opacification is somewhat limited although no definitive dissection is seen. The pulmonary artery is well visualized and demonstrates a normal branching pattern. No filling defect to suggest pulmonary embolism is identified. Moderate coronary calcifications are seen. The cardiac structures appear within normal limits. Mediastinum/Nodes: Multiple calcified hilar and mediastinal lymph nodes are noted consistent with prior granulomatous disease. No significant lymphadenopathy is noted. The thoracic inlet shows no acute abnormality. No axillary adenopathy is seen. Lungs/Pleura: The lungs are well aerated bilaterally. Scattered subpleural nodular densities are noted within the right lung as well as the left upper lobe. The left upper lobe nodule measures approximately 7 mm. The largest of the right lung nodules measures  approximately 7 mm lying with right middle lobe. No pneumothorax or sizable effusion is noted. Upper Abdomen: No acute abnormality. Musculoskeletal: Degenerative changes of the thoracic spine are noted. Review of the MIP images confirms the above findings. IMPRESSION: Bilateral pulmonary nodules which are noncalcified in nature. Given the calcified lymph nodes in the hila and mediastinum these statistically represent noncalcified granulomas although Non-contrast chest CT at 3-6 months is recommended. If the nodules are stable at time of repeat CT, then future CT at 18-24 months (from today's scan) is considered optional for low-risk patients, but is recommended for high-risk patients. This recommendation follows the consensus statement: Guidelines for Management of Incidental Pulmonary Nodules Detected on CT Images: From the Fleischner Society 2017; Radiology 2017; 284:228-243. No evidence of pulmonary  emboli. Changes of prior granulomatous disease. Electronically Signed   By: Inez Catalina M.D.   On: 03/09/2016 09:07    Assessment & Plan:   Matthew Villanueva was seen today for dizziness.  Diagnoses and all orders for this visit:  Need for influenza vaccination -     Flu vaccine HIGH DOSE PF (Fluzone High dose)  BPPV (benign paroxysmal positional vertigo), bilateral- his symptoms are consistent with BPPV. He has no alarming neurological symptoms and his neurologic exam is normal. Will treat with a course of meclizine but I also encouraged him to be patient as this will resolve over time. He will let me know if he develops any new or worsening symptoms. -     meclizine (ANTIVERT) 12.5 MG tablet; Take 1 tablet (12.5 mg total) by mouth 3 (three) times daily as needed for dizziness.  Chronic painful diabetic neuropathy ([place])- will try higher dose of Lyrica. -     pregabalin (LYRICA) 100 MG capsule; Take 1 capsule (100 mg total) by mouth 3 (three) times daily.   I have discontinued Matthew Villanueva pregabalin. I am also having him start on pregabalin and meclizine. Additionally, I am having him maintain his aspirin, Fish Oil, colchicine, losartan-hydrochlorothiazide, empagliflozin, and sildenafil.  Meds ordered this encounter  Medications  . pregabalin (LYRICA) 100 MG capsule    Sig: Take 1 capsule (100 mg total) by mouth 3 (three) times daily.    Dispense:  90 capsule    Refill:  5  . meclizine (ANTIVERT) 12.5 MG tablet    Sig: Take 1 tablet (12.5 mg total) by mouth 3 (three) times daily as needed for dizziness.    Dispense:  60 tablet    Refill:  0     Follow-up: Return in about 3 weeks (around 01/31/2017).  Scarlette Calico, MD

## 2017-01-30 ENCOUNTER — Other Ambulatory Visit: Payer: Self-pay | Admitting: Internal Medicine

## 2017-01-30 DIAGNOSIS — E1121 Type 2 diabetes mellitus with diabetic nephropathy: Secondary | ICD-10-CM

## 2017-02-01 ENCOUNTER — Telehealth: Payer: Self-pay | Admitting: Internal Medicine

## 2017-02-01 ENCOUNTER — Other Ambulatory Visit: Payer: Self-pay | Admitting: Internal Medicine

## 2017-02-01 NOTE — Telephone Encounter (Signed)
Pt called in and wanted to know if he could get some sample of empagliflozin (JARDIANCE) 25 MG TABS tablet [[id number]] And pregabalin (LYRICA) 100 MG capsule [[id number]]   He is in the donut hole and cant afford these meds

## 2017-02-02 NOTE — Telephone Encounter (Signed)
I have left patient a vm

## 2017-02-02 NOTE — Telephone Encounter (Signed)
I have samples of the jardiance but I do not have samples of the lyrica. I also grabbed an information packet for the patient to help him get out of the donut hole.  Will you call him and let him know please. Logging samples and placing up front.

## 2017-02-20 DIAGNOSIS — M542 Cervicalgia: Secondary | ICD-10-CM | POA: Diagnosis not present

## 2017-03-15 ENCOUNTER — Other Ambulatory Visit: Payer: Self-pay | Admitting: Internal Medicine

## 2017-03-20 DIAGNOSIS — M5030 Other cervical disc degeneration, unspecified cervical region: Secondary | ICD-10-CM | POA: Diagnosis not present

## 2017-03-20 DIAGNOSIS — E1142 Type 2 diabetes mellitus with diabetic polyneuropathy: Secondary | ICD-10-CM | POA: Diagnosis not present

## 2017-04-09 ENCOUNTER — Telehealth: Payer: Self-pay | Admitting: Internal Medicine

## 2017-04-09 NOTE — Telephone Encounter (Signed)
Pt called asking if he can receive samples of his pregabalin (LYRICA) 100 MG capsule And  empagliflozin (JARDIANCE) 25 MG TABS tablet States these cost a lot and he cannot afford them  Please call back in regard

## 2017-04-09 NOTE — Telephone Encounter (Signed)
I am leaving a coupon up front for the Jardiance and info for the patient regarding [employer] to see if he quilifies for the program for the Lyrica

## 2017-04-09 NOTE — Telephone Encounter (Signed)
I do not have samples of either.

## 2017-04-11 ENCOUNTER — Other Ambulatory Visit: Payer: Self-pay | Admitting: Internal Medicine

## 2017-04-11 ENCOUNTER — Telehealth: Payer: Self-pay | Admitting: Internal Medicine

## 2017-04-11 DIAGNOSIS — E1140 Type 2 diabetes mellitus with diabetic neuropathy, unspecified: Secondary | ICD-10-CM

## 2017-04-11 MED ORDER — GABAPENTIN 100 MG PO CAPS: 100.0000 mg | ORAL_CAPSULE | Freq: Three times a day (TID) | ORAL | 1 refills | Status: DC

## 2017-04-11 NOTE — Telephone Encounter (Signed)
RX sent to [facility]

## 2017-04-11 NOTE — Telephone Encounter (Signed)
Copied from [place]. Topic: Quick Communication - See Telephone Encounter >> Apr 11, 2017  9:15 AM Bea Graff, NT wrote: CRM for notification. See Telephone encounter for: Patient calling to see if Dr. Ronnald Ramp has approved this pt for gabapentin. Please advise.   04/11/17.

## 2017-04-11 NOTE — Telephone Encounter (Signed)
Okay to send in gabapentin. Lyrica is not covered.

## 2017-04-15 ENCOUNTER — Other Ambulatory Visit: Payer: Self-pay | Admitting: Internal Medicine

## 2017-04-15 DIAGNOSIS — Z794 Long term (current) use of insulin: Secondary | ICD-10-CM

## 2017-04-15 DIAGNOSIS — E1121 Type 2 diabetes mellitus with diabetic nephropathy: Secondary | ICD-10-CM

## 2017-04-15 MED ORDER — EMPAGLIFLOZIN 25 MG PO TABS: 25.0000 mg | ORAL_TABLET | Freq: Every day | ORAL | 0 refills | Status: DC

## 2017-05-17 ENCOUNTER — Other Ambulatory Visit (INDEPENDENT_AMBULATORY_CARE_PROVIDER_SITE_OTHER): Payer: Medicare HMO

## 2017-05-17 ENCOUNTER — Encounter: Payer: Self-pay | Admitting: Internal Medicine

## 2017-05-17 ENCOUNTER — Ambulatory Visit (INDEPENDENT_AMBULATORY_CARE_PROVIDER_SITE_OTHER): Payer: Medicare HMO | Admitting: Internal Medicine

## 2017-05-17 VITALS — BP 136/78 | HR 70 | Temp 97.7°F | Resp 16 | Ht 70.0 in | Wt 258.1 lb

## 2017-05-17 DIAGNOSIS — Z23 Encounter for immunization: Secondary | ICD-10-CM

## 2017-05-17 DIAGNOSIS — E1121 Type 2 diabetes mellitus with diabetic nephropathy: Secondary | ICD-10-CM

## 2017-05-17 DIAGNOSIS — E781 Pure hyperglyceridemia: Secondary | ICD-10-CM

## 2017-05-17 DIAGNOSIS — I10 Essential (primary) hypertension: Secondary | ICD-10-CM

## 2017-05-17 LAB — BASIC METABOLIC PANEL
BUN: 15 mg/dL (ref 6–23)
CHLORIDE: 100 meq/L (ref 96–112)
CO2: 28 mEq/L (ref 19–32)
CREATININE: 0.99 mg/dL (ref 0.40–1.50)
Calcium: 9.3 mg/dL (ref 8.4–10.5)
GFR: 77.46 mL/min (ref >60.00)
Glucose, Bld: 135 mg/dL — ABNORMAL HIGH (ref 70–99)
Potassium: 3.8 mEq/L (ref 3.5–5.1)
Sodium: 137 mEq/L (ref 135–145)

## 2017-05-17 LAB — URINALYSIS, ROUTINE W REFLEX MICROSCOPIC
Bilirubin Urine: NEGATIVE
HGB URINE DIPSTICK: NEGATIVE
Ketones, ur: NEGATIVE
Leukocytes, UA: NEGATIVE
NITRITE: NEGATIVE
RBC / HPF: NONE SEEN (ref 0–?)
SPECIFIC GRAVITY, URINE: 1.01 (ref 1.000–1.030)
Total Protein, Urine: NEGATIVE
Urine Glucose: >=1000 — AB
Urobilinogen, UA: 0.2 (ref 0.0–1.0)
pH: 6 (ref 5.0–8.0)

## 2017-05-17 LAB — MICROALBUMIN / CREATININE URINE RATIO
CREATININE, U: 53.6 mg/dL
Microalb Creat Ratio: 12.9 mg/g (ref 0.0–30.0)
Microalb, Ur: 6.9 mg/dL — ABNORMAL HIGH (ref 0.0–1.9)

## 2017-05-17 LAB — HEMOGLOBIN A1C: Hgb A1c MFr Bld: 7.5 % — ABNORMAL HIGH (ref 4.6–6.5)

## 2017-05-17 LAB — TRIGLYCERIDES: TRIGLYCERIDES: 166 mg/dL — AB (ref 0.0–149.0)

## 2017-05-17 MED ORDER — ACCU-CHEK GUIDE W/DEVICE KIT: 1.0000 | PACK | Freq: Two times a day (BID) | 0 refills | Status: DC

## 2017-05-17 MED ORDER — GLUCOSE BLOOD VI STRP: 1.0000 | ORAL_STRIP | Freq: Two times a day (BID) | 11 refills | Status: DC

## 2017-05-17 MED ORDER — ZOSTER VAC RECOMB ADJUVANTED 50 MCG/0.5ML IM SUSR: 0.5000 mL | Freq: Once | INTRAMUSCULAR | 1 refills | Status: AC

## 2017-05-17 NOTE — Progress Notes (Signed)
Subjective:  Patient ID: Matthew Villanueva, male    DOB: 04/03/1936  Age: 82 y.o. MRN: [id number]  CC: Hypertension; Hyperlipidemia; and Diabetes   HPI Matthew Villanueva presents for f/up - he complains of weight gain and is concerned that his blood sugars may not be well controlled.  Outpatient Medications Prior to Visit  Medication Sig Dispense Refill  . aspirin 81 MG tablet Take 81 mg by mouth daily.      . colchicine 0.6 MG tablet Take 1.2 mg by mouth daily as needed (gout). Take an additional 0.6 mg by mouth two (2) hours later if needed again for gout.    . empagliflozin (JARDIANCE) 25 MG TABS tablet Take 25 mg by mouth daily. 90 tablet 0  . gabapentin (NEURONTIN) 100 MG capsule Take 1 capsule (100 mg total) by mouth 3 (three) times daily. 270 capsule 1  . losartan-hydrochlorothiazide (HYZAAR) 100-25 MG tablet TAKE ONE TABLET BY MOUTH DAILY 90 tablet 0  . meclizine (ANTIVERT) 12.5 MG tablet Take 1 tablet (12.5 mg total) by mouth 3 (three) times daily as needed for dizziness. 60 tablet 0  . Omega-3 Fatty Acids (FISH OIL) 1000 MG CPDR Take 1,000 mg by mouth daily.    . sildenafil (REVATIO) 20 MG tablet TAKE ONE TABLET BY MOUTH DAILY AS NEEDED 10 tablet 10   No facility-administered medications prior to visit.     ROS Review of Systems  Constitutional: Positive for unexpected weight change. Negative for appetite change, diaphoresis and fatigue.  HENT: Negative.   Eyes: Negative for visual disturbance.  Respiratory: Negative for cough, chest tightness, shortness of breath and wheezing.   Cardiovascular: Negative for chest pain, palpitations and leg swelling.  Gastrointestinal: Negative.  Negative for abdominal pain, constipation, diarrhea, nausea and vomiting.  Endocrine: Negative for polydipsia, polyphagia and polyuria.  Genitourinary: Negative.  Negative for difficulty urinating.  Musculoskeletal: Negative.  Negative for arthralgias, myalgias and neck pain.  Skin:  Negative.   Allergic/Immunologic: Negative.   Neurological: Negative.  Negative for dizziness, weakness and light-headedness.  Hematological: Negative for adenopathy. Does not bruise/bleed easily.  Psychiatric/Behavioral: Negative.     Objective:  BP 136/78 (BP Location: Left Arm, Patient Position: Sitting, Cuff Size: Normal)   Pulse 70   Temp 97.7 F (36.5 C) (Oral)   Resp 16   Ht '5\' 10"'$  (1.778 m)   Wt 258 lb 1.9 oz (117.1 kg)   SpO2 97%   BMI 37.04 kg/m   BP Readings from Last 3 Encounters:  05/17/17 136/78  01/10/17 138/70  11/29/16 124/82    Wt Readings from Last 3 Encounters:  05/17/17 258 lb 1.9 oz (117.1 kg)  01/10/17 256 lb (116.1 kg)  11/29/16 258 lb (117 kg)    Physical Exam  Constitutional: He is oriented to person, place, and time. No distress.  HENT:  Mouth/Throat: Oropharynx is clear and moist. No oropharyngeal exudate.  Eyes: Conjunctivae are normal. Left eye exhibits no discharge. No scleral icterus.  Neck: Normal range of motion. Neck supple. No JVD present. No thyromegaly present.  Cardiovascular: Normal rate, regular rhythm and normal heart sounds. Exam reveals no gallop.  No murmur heard. Pulmonary/Chest: Effort normal and breath sounds normal. No respiratory distress. He has no rales.  Abdominal: Soft. He exhibits no mass. There is no tenderness.  Musculoskeletal: Normal range of motion. He exhibits no edema, tenderness or deformity.  Lymphadenopathy:    He has no cervical adenopathy.  Neurological: He is alert and oriented to person,  place, and time.  Skin: Skin is warm and dry. No rash noted. He is not diaphoretic. No erythema. No pallor.  Vitals reviewed.   Lab Results  Component Value Date   WBC 9.3 09/12/2016   HGB 16.4 09/12/2016   HCT 48.6 09/12/2016   PLT 208.0 09/12/2016   GLUCOSE 135 (H) 05/17/2017   CHOL 155 09/12/2016   TRIG 166.0 (H) 05/17/2017   HDL 40.40 09/12/2016   LDLDIRECT 88.0 09/12/2016   LDLCALC 72 12/24/2013    ALT 26 09/12/2016   AST 21 09/12/2016   NA 137 05/17/2017   K 3.8 05/17/2017   CL 100 05/17/2017   CREATININE 0.99 05/17/2017   BUN 15 05/17/2017   CO2 28 05/17/2017   TSH 1.43 09/12/2016   PSA 2.87 10/19/2015   HGBA1C 7.5 (H) 05/17/2017   MICROALBUR 6.9 (H) 05/17/2017    Ct Angio Chest W/cm &/or Wo Cm  Result Date: 03/09/2016 CLINICAL DATA:  Shortness of breath with exertion EXAM: CT ANGIOGRAPHY CHEST WITH CONTRAST TECHNIQUE: Multidetector CT imaging of the chest was performed using the standard protocol during bolus administration of intravenous contrast. Multiplanar CT image reconstructions and MIPs were obtained to evaluate the vascular anatomy. CONTRAST:  80 mL Isovue 370. COMPARISON:  None. FINDINGS: Cardiovascular: Thoracic aorta demonstrates some calcific changes without definitive aneurysmal dilatation. Opacification is somewhat limited although no definitive dissection is seen. The pulmonary artery is well visualized and demonstrates a normal branching pattern. No filling defect to suggest pulmonary embolism is identified. Moderate coronary calcifications are seen. The cardiac structures appear within normal limits. Mediastinum/Nodes: Multiple calcified hilar and mediastinal lymph nodes are noted consistent with prior granulomatous disease. No significant lymphadenopathy is noted. The thoracic inlet shows no acute abnormality. No axillary adenopathy is seen. Lungs/Pleura: The lungs are well aerated bilaterally. Scattered subpleural nodular densities are noted within the right lung as well as the left upper lobe. The left upper lobe nodule measures approximately 7 mm. The largest of the right lung nodules measures approximately 7 mm lying with right middle lobe. No pneumothorax or sizable effusion is noted. Upper Abdomen: No acute abnormality. Musculoskeletal: Degenerative changes of the thoracic spine are noted. Review of the MIP images confirms the above findings. IMPRESSION: Bilateral  pulmonary nodules which are noncalcified in nature. Given the calcified lymph nodes in the hila and mediastinum these statistically represent noncalcified granulomas although Non-contrast chest CT at 3-6 months is recommended. If the nodules are stable at time of repeat CT, then future CT at 18-24 months (from today's scan) is considered optional for low-risk patients, but is recommended for high-risk patients. This recommendation follows the consensus statement: Guidelines for Management of Incidental Pulmonary Nodules Detected on CT Images: From the Fleischner Society 2017; Radiology 2017; 284:228-243. No evidence of pulmonary emboli. Changes of prior granulomatous disease. Electronically Signed   By: Inez Catalina M.D.   On: 03/09/2016 09:07    Assessment & Plan:   Tynell was seen today for hypertension, hyperlipidemia and diabetes.  Diagnoses and all orders for this visit:  Pure hyperglyceridemia- His triglycerides are very, very mildly elevated.  This does not require medical therapy.  He was encouraged to work on his lifestyle modifications. -     Triglycerides; Future  Essential hypertension- His blood pressure is well controlled.  Electrolytes and renal function are normal. -     Basic metabolic panel; Future -     Urinalysis, Routine w reflex microscopic; Future  Type 2 diabetes mellitus with diabetic nephropathy, without  long-term current use of insulin ([place])- His A1c is up to 7.5%.  His blood sugars are not adequately well controlled.  He does not tolerate metformin.  Will continue the SGLT-2 inhibitor but will also add a GLP-1 agonist to help with weight management as well as blood sugar control. -     Basic metabolic panel; Future -     Hemoglobin A1c; Future -     Microalbumin / creatinine urine ratio; Future -     Blood Glucose Monitoring Suppl (ACCU-CHEK GUIDE) w/Device KIT; 1 Act by Does not apply route 2 (two) times daily. -     glucose blood (ACCU-CHEK GUIDE) test strip; 1 each  by Other route 2 (two) times daily. Use as instructed -     Exenatide ER (BYDUREON BCISE) 2 MG/0.85ML AUIJ; Inject 1 Act into the skin once a week.  Need for pneumococcal vaccination -     Pneumococcal polysaccharide vaccine 23-valent greater than or equal to 2yo subcutaneous/IM  Other orders -     Zoster Vaccine Adjuvanted [facility]) injection; Inject 0.5 mLs into the muscle once for 1 dose.   I am having Julien C. [place] start on ACCU-CHEK GUIDE, glucose blood, Zoster Vaccine Adjuvanted, and Exenatide ER. I am also having him maintain his aspirin, Fish Oil, colchicine, sildenafil, meclizine, losartan-hydrochlorothiazide, gabapentin, and empagliflozin.  Meds ordered this encounter  Medications  . Blood Glucose Monitoring Suppl (ACCU-CHEK GUIDE) w/Device KIT    Sig: 1 Act by Does not apply route 2 (two) times daily.    Dispense:  2 kit    Refill:  0  . glucose blood (ACCU-CHEK GUIDE) test strip    Sig: 1 each by Other route 2 (two) times daily. Use as instructed    Dispense:  100 each    Refill:  11  . Zoster Vaccine Adjuvanted [facility]) injection    Sig: Inject 0.5 mLs into the muscle once for 1 dose.    Dispense:  0.5 mL    Refill:  1  . Exenatide ER (BYDUREON BCISE) 2 MG/0.85ML AUIJ    Sig: Inject 1 Act into the skin once a week.    Dispense:  12 pen    Refill:  1     Follow-up: Return in about 6 months (around 11/14/2017).  Scarlette Calico, MD

## 2017-05-17 NOTE — Patient Instructions (Signed)

## 2017-05-19 MED ORDER — EXENATIDE ER 2 MG/0.85ML ~~LOC~~ AUIJ: 1.0000 | AUTO-INJECTOR | SUBCUTANEOUS | 1 refills | Status: DC

## 2017-05-28 ENCOUNTER — Telehealth: Payer: Self-pay | Admitting: Internal Medicine

## 2017-05-28 NOTE — Telephone Encounter (Signed)
[place] on [address].  Was advised that there is an order for the Accu Check Glide Test Strips for the pt. To get refills.   Notified the pt. that refills are available on his Accu Check Glide Test Strips.  Pt. Verb. Understanding.

## 2017-05-28 NOTE — Telephone Encounter (Signed)
Copied from [place] [phone]. Topic: Quick Communication - Rx Refill/Question >> May 28, 2017  1:30 PM Synthia Innocent wrote: Medication: Accu Check Glide test strips   Has the patient contacted their pharmacy? Yes.     (Agent: If no, request that the patient contact the pharmacy for the refill.)   Preferred Pharmacy (with phone number or street name): [facility]   Agent: Please be advised that RX refills may take up to 3 business days. We ask that you follow-up with your pharmacy.

## 2017-06-01 DIAGNOSIS — R69 Illness, unspecified: Secondary | ICD-10-CM | POA: Diagnosis not present

## 2017-06-01 MED ORDER — GLUCOSE BLOOD VI STRP: ORAL_STRIP | 12 refills | Status: AC

## 2017-06-01 MED ORDER — BLOOD GLUCOSE MONITOR KIT: PACK | 0 refills | Status: DC

## 2017-06-01 MED ORDER — ONETOUCH ULTRASOFT LANCETS MISC: 12 refills | Status: DC

## 2017-06-01 NOTE — Telephone Encounter (Signed)
Accuchek is not covered under pt medicare part d.  Sent in One Touch meter, test strips and lancets that are covered.

## 2017-06-09 DIAGNOSIS — R69 Illness, unspecified: Secondary | ICD-10-CM | POA: Diagnosis not present

## 2017-06-10 ENCOUNTER — Other Ambulatory Visit: Payer: Self-pay | Admitting: Internal Medicine

## 2017-06-13 ENCOUNTER — Other Ambulatory Visit: Payer: Self-pay

## 2017-06-13 MED ORDER — BLOOD GLUCOSE MONITOR KIT: PACK | 0 refills | Status: DC

## 2017-06-13 NOTE — Telephone Encounter (Addendum)
One touch meter sent to [facility] as requested.

## 2017-06-15 DIAGNOSIS — R69 Illness, unspecified: Secondary | ICD-10-CM | POA: Diagnosis not present

## 2017-08-04 DIAGNOSIS — R69 Illness, unspecified: Secondary | ICD-10-CM | POA: Diagnosis not present

## 2017-08-08 ENCOUNTER — Other Ambulatory Visit: Payer: Self-pay | Admitting: Internal Medicine

## 2017-08-22 ENCOUNTER — Ambulatory Visit (INDEPENDENT_AMBULATORY_CARE_PROVIDER_SITE_OTHER): Payer: Medicare HMO | Admitting: Internal Medicine

## 2017-08-22 ENCOUNTER — Encounter: Payer: Self-pay | Admitting: Internal Medicine

## 2017-08-22 VITALS — BP 118/70 | HR 66 | Temp 97.6°F | Resp 16 | Ht 70.0 in | Wt 252.2 lb

## 2017-08-22 DIAGNOSIS — E1121 Type 2 diabetes mellitus with diabetic nephropathy: Secondary | ICD-10-CM

## 2017-08-22 LAB — POCT GLYCOSYLATED HEMOGLOBIN (HGB A1C): Hemoglobin A1C: 6.3

## 2017-08-22 NOTE — Patient Instructions (Signed)

## 2017-08-22 NOTE — Progress Notes (Signed)
Subjective:  Patient ID: Matthew Villanueva, male    DOB: Nov 29, 1935  Age: 82 y.o. MRN: [id number]  CC: Hypertension and Diabetes   HPI Matthew Villanueva presents for f/up - He complains that the Bydureon Bcise pen is leaving painful lumps in his abdomen.  He does however tell me that his blood sugars have been well controlled and he has had no recent episodes of polys.  Outpatient Medications Prior to Visit  Medication Sig Dispense Refill  . aspirin 81 MG tablet Take 81 mg by mouth daily.      . blood glucose meter kit and supplies KIT Use to test blood sugar up to twice daily. 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 colchicine 0.6 MG tablet Take 1.2 mg by mouth daily as needed (gout). Take an additional 0.6 mg by mouth two (2) hours later if needed again for gout.    . empagliflozin (JARDIANCE) 25 MG TABS tablet Take 25 mg by mouth daily. 90 tablet 0  . gabapentin (NEURONTIN) 100 MG capsule Take 1 capsule (100 mg total) by mouth 3 (three) times daily. 270 capsule 1  . glucose blood (COOL BLOOD GLUCOSE TEST STRIPS) test strip Use to test blood sugar up to twice daily. DX: E11.21 100 each 12  . Lancets (ONETOUCH ULTRASOFT) lancets Use to test blood sugar up to twice daily. DX: E11.21 100 each 12  . losartan-hydrochlorothiazide (HYZAAR) 100-25 MG tablet TAKE 1 TABLET BY MOUTH DAILY 90 tablet 0  . meclizine (ANTIVERT) 12.5 MG tablet Take 1 tablet (12.5 mg total) by mouth 3 (three) times daily as needed for dizziness. 60 tablet 0  . Omega-3 Fatty Acids (FISH OIL) 1000 MG CPDR Take 1,000 mg by mouth daily.    . sildenafil (REVATIO) 20 MG tablet TAKE ONE TABLET BY MOUTH DAILY AS NEEDED 10 tablet 10  . Exenatide ER (BYDUREON BCISE) 2 MG/0.85ML AUIJ Inject 1 Act into the skin once a week. 12 pen 1  . losartan-hydrochlorothiazide (HYZAAR) 100-25 MG tablet TAKE ONE TABLET BY MOUTH DAILY 90 tablet 0   No facility-administered medications prior to visit.     ROS Review of Systems  Constitutional: Negative.   Negative for appetite change, diaphoresis, fatigue and unexpected weight change.  HENT: Negative.  Negative for trouble swallowing.   Eyes: Negative for visual disturbance.  Respiratory: Negative for cough, chest tightness, shortness of breath and wheezing.   Cardiovascular: Negative.  Negative for chest pain, palpitations and leg swelling.  Gastrointestinal: Negative for abdominal pain, constipation, diarrhea, nausea and vomiting.  Endocrine: Negative for polydipsia, polyphagia and polyuria.  Genitourinary: Negative.  Negative for difficulty urinating.  Musculoskeletal: Negative.  Negative for arthralgias and neck pain.  Skin: Negative.  Negative for color change and rash.  Neurological: Negative.  Negative for dizziness, weakness and light-headedness.  Hematological: Negative for adenopathy. Does not bruise/bleed easily.  Psychiatric/Behavioral: Negative.     Objective:  BP 118/70 (BP Location: Left Arm, Patient Position: Sitting, Cuff Size: Large)   Pulse 66   Temp 97.6 F (36.4 C) (Oral)   Resp 16   Ht '5\' 10"'$  (1.778 m)   Wt 252 lb 4 oz (114.4 kg)   SpO2 95%   BMI 36.19 kg/m   BP Readings from Last 3 Encounters:  08/22/17 118/70  05/17/17 136/78  01/10/17 138/70    Wt Readings from Last 3 Encounters:  08/22/17 252 lb 4 oz (114.4 kg)  05/17/17 258 lb 1.9 oz (117.1 kg)  01/10/17 256 lb (116.1  kg)    Physical Exam  Constitutional: He is oriented to person, place, and time. No distress.  HENT:  Mouth/Throat: No oropharyngeal exudate.  Eyes: Conjunctivae are normal. No scleral icterus.  Neck: Normal range of motion. Neck supple. No thyromegaly present.  Cardiovascular: Normal rate, regular rhythm, normal heart sounds and intact distal pulses. Exam reveals no gallop and no friction rub.  No murmur heard. Pulmonary/Chest: Effort normal and breath sounds normal. No stridor. No respiratory distress. He has no wheezes. He has no rales.  Abdominal: Soft. Bowel sounds are  normal. He exhibits no distension and no mass. There is no tenderness. No hernia.    Musculoskeletal: Normal range of motion. He exhibits no edema, tenderness or deformity.  Lymphadenopathy:    He has no cervical adenopathy.  Neurological: He is alert and oriented to person, place, and time.  Skin: Skin is warm and dry. He is not diaphoretic.  Vitals reviewed.   Lab Results  Component Value Date   WBC 9.3 09/12/2016   HGB 16.4 09/12/2016   HCT 48.6 09/12/2016   PLT 208.0 09/12/2016   GLUCOSE 135 (H) 05/17/2017   CHOL 155 09/12/2016   TRIG 166.0 (H) 05/17/2017   HDL 40.40 09/12/2016   LDLDIRECT 88.0 09/12/2016   LDLCALC 72 12/24/2013   ALT 26 09/12/2016   AST 21 09/12/2016   NA 137 05/17/2017   K 3.8 05/17/2017   CL 100 05/17/2017   CREATININE 0.99 05/17/2017   BUN 15 05/17/2017   CO2 28 05/17/2017   TSH 1.43 09/12/2016   PSA 2.87 10/19/2015   HGBA1C 6.3 08/22/2017   MICROALBUR 6.9 (H) 05/17/2017    Ct Angio Chest W/cm &/or Wo Cm  Result Date: 03/09/2016 CLINICAL DATA:  Shortness of breath with exertion EXAM: CT ANGIOGRAPHY CHEST WITH CONTRAST TECHNIQUE: Multidetector CT imaging of the chest was performed using the standard protocol during bolus administration of intravenous contrast. Multiplanar CT image reconstructions and MIPs were obtained to evaluate the vascular anatomy. CONTRAST:  80 mL Isovue 370. COMPARISON:  None. FINDINGS: Cardiovascular: Thoracic aorta demonstrates some calcific changes without definitive aneurysmal dilatation. Opacification is somewhat limited although no definitive dissection is seen. The pulmonary artery is well visualized and demonstrates a normal branching pattern. No filling defect to suggest pulmonary embolism is identified. Moderate coronary calcifications are seen. The cardiac structures appear within normal limits. Mediastinum/Nodes: Multiple calcified hilar and mediastinal lymph nodes are noted consistent with prior granulomatous disease.  No significant lymphadenopathy is noted. The thoracic inlet shows no acute abnormality. No axillary adenopathy is seen. Lungs/Pleura: The lungs are well aerated bilaterally. Scattered subpleural nodular densities are noted within the right lung as well as the left upper lobe. The left upper lobe nodule measures approximately 7 mm. The largest of the right lung nodules measures approximately 7 mm lying with right middle lobe. No pneumothorax or sizable effusion is noted. Upper Abdomen: No acute abnormality. Musculoskeletal: Degenerative changes of the thoracic spine are noted. Review of the MIP images confirms the above findings. IMPRESSION: Bilateral pulmonary nodules which are noncalcified in nature. Given the calcified lymph nodes in the hila and mediastinum these statistically represent noncalcified granulomas although Non-contrast chest CT at 3-6 months is recommended. If the nodules are stable at time of repeat CT, then future CT at 18-24 months (from today's scan) is considered optional for low-risk patients, but is recommended for high-risk patients. This recommendation follows the consensus statement: Guidelines for Management of Incidental Pulmonary Nodules Detected on CT Images: From  the Fleischner Society 2017; Radiology 2017; [id number]. No evidence of pulmonary emboli. Changes of prior granulomatous disease. Electronically Signed   By: Inez Catalina M.D.   On: 03/09/2016 09:07    Assessment & Plan:   Matthew Villanueva was seen today for hypertension and diabetes.  Diagnoses and all orders for this visit:  Type 2 diabetes mellitus with diabetic nephropathy, without long-term current use of insulin ([place])- His A1c is down to 6.3% and he is not tolerating the GLP-1 agonist injection.  Will discontinue Bydureon Bcise and will continue to control the blood sugars with an SGLT-2 inhibitor.  He does not tolerate metformin. -     POCT glycosylated hemoglobin (Hb A1C)   I have discontinued Jace C. Teti's  Exenatide ER. I am also having him maintain his aspirin, Fish Oil, colchicine, sildenafil, meclizine, gabapentin, empagliflozin, onetouch ultrasoft, glucose blood, blood glucose meter kit and supplies, and losartan-hydrochlorothiazide.  No orders of the defined types were placed in this encounter.    Follow-up: Return in about 4 months (around 12/22/2017).  Scarlette Calico, MD

## 2017-09-07 ENCOUNTER — Encounter: Payer: Self-pay | Admitting: Internal Medicine

## 2017-09-07 ENCOUNTER — Ambulatory Visit (INDEPENDENT_AMBULATORY_CARE_PROVIDER_SITE_OTHER): Payer: Medicare HMO | Admitting: Internal Medicine

## 2017-09-07 VITALS — BP 126/78 | HR 72 | Temp 99.0°F | Ht 70.0 in | Wt 259.0 lb

## 2017-09-07 DIAGNOSIS — R6889 Other general symptoms and signs: Secondary | ICD-10-CM

## 2017-09-07 DIAGNOSIS — I10 Essential (primary) hypertension: Secondary | ICD-10-CM | POA: Diagnosis not present

## 2017-09-07 DIAGNOSIS — J019 Acute sinusitis, unspecified: Secondary | ICD-10-CM

## 2017-09-07 DIAGNOSIS — E1121 Type 2 diabetes mellitus with diabetic nephropathy: Secondary | ICD-10-CM | POA: Diagnosis not present

## 2017-09-07 LAB — POCT INFLUENZA A/B
Influenza A, POC: NEGATIVE
Influenza B, POC: NEGATIVE

## 2017-09-07 MED ORDER — AZITHROMYCIN 250 MG PO TABS: ORAL_TABLET | ORAL | 1 refills | Status: DC

## 2017-09-07 MED ORDER — HYDROCODONE-HOMATROPINE 5-1.5 MG/5ML PO SYRP: 5.0000 mL | ORAL_SOLUTION | Freq: Four times a day (QID) | ORAL | 0 refills | Status: DC | PRN

## 2017-09-07 NOTE — Progress Notes (Signed)
Subjective:    Patient ID: Matthew Villanueva, male    DOB: June 09, 1935, 82 y.o.   MRN: [id number]  HPI   Here with 2-3 days acute onset fever, facial pain, pressure, headache, general weakness and malaise, and greenish d/c, with mild ST and cough, but pt denies chest pain, wheezing, increased sob or doe, orthopnea, PND, increased LE swelling, palpitations, dizziness or syncope.  Pt denies new neurological symptoms such as new headache, or facial or extremity weakness or numbness   Pt denies polydipsia, polyuria No other interval hx or new complaint Past Medical History:  Diagnosis Date  . Arthritis   . Diabetes mellitus    NO INSULIN  . Dyspnea   . Gout   . History of colonic polyps   . HTN (hypertension)   . Localized osteoarthrosis not specified whether primary or secondary, hand   . OSA (obstructive sleep apnea)   . Other specified cardiac dysrhythmias(427.89) 2004   ABLATION DUE TO TACHYCARDIA  . Right bundle branch block    Past Surgical History:  Procedure Laterality Date  . CARDIAC ELECTROPHYSIOLOGY STUDY AND ABLATION  2004   ablation for SVT with a chronic right bundle branch block   . [place]  . PILONIDAL CYST DRAINAGE      reports that he quit smoking about 33 years ago. His smoking use included cigarettes. He has a 60.00 pack-year smoking history. He has never used smokeless tobacco. He reports that he does not drink alcohol or use drugs. family history includes Cancer in his father; Diabetes in his maternal uncle; Heart attack in his maternal grandmother; Heart disease in his maternal aunt, maternal grandfather, maternal grandmother, and mother; Kidney cancer in his sister. Allergies  Allergen Reactions  . Livalo [Pitavastatin] Other (See Comments)    Muscle aches  . Crestor [Rosuvastatin]     Muscle aches  . Metformin And Related Diarrhea  . Pravastatin     Muscle aches   Current Outpatient Medications on File Prior to Visit  Medication Sig  Dispense Refill  . aspirin 81 MG tablet Take 81 mg by mouth daily.      . blood glucose meter kit and supplies KIT Use to test blood sugar up to twice daily. 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 colchicine 0.6 MG tablet Take 1.2 mg by mouth daily as needed (gout). Take an additional 0.6 mg by mouth two (2) hours later if needed again for gout.    . empagliflozin (JARDIANCE) 25 MG TABS tablet Take 25 mg by mouth daily. 90 tablet 0  . gabapentin (NEURONTIN) 100 MG capsule Take 1 capsule (100 mg total) by mouth 3 (three) times daily. 270 capsule 1  . glucose blood (COOL BLOOD GLUCOSE TEST STRIPS) test strip Use to test blood sugar up to twice daily. DX: E11.21 100 each 12  . Lancets (ONETOUCH ULTRASOFT) lancets Use to test blood sugar up to twice daily. DX: E11.21 100 each 12  . losartan-hydrochlorothiazide (HYZAAR) 100-25 MG tablet TAKE 1 TABLET BY MOUTH DAILY 90 tablet 0  . meclizine (ANTIVERT) 12.5 MG tablet Take 1 tablet (12.5 mg total) by mouth 3 (three) times daily as needed for dizziness. 60 tablet 0  . Omega-3 Fatty Acids (FISH OIL) 1000 MG CPDR Take 1,000 mg by mouth daily.    . sildenafil (REVATIO) 20 MG tablet TAKE ONE TABLET BY MOUTH DAILY AS NEEDED 10 tablet 10   No current facility-administered medications on file prior to visit.  Review of Systems  Constitutional: Negative for other unusual diaphoresis or sweats HENT: Negative for ear discharge or swelling Eyes: Negative for other worsening visual disturbances Respiratory: Negative for stridor or other swelling  Gastrointestinal: Negative for worsening distension or other blood Genitourinary: Negative for retention or other urinary change Musculoskeletal: Negative for other MSK pain or swelling Skin: Negative for color change or other new lesions Neurological: Negative for worsening tremors and other numbness  Psychiatric/Behavioral: Negative for worsening agitation or other fatigue All other system neg per pt    Objective:   Physical  Exam BP 126/78   Pulse 72   Temp 99 F (37.2 C) (Oral)   Ht '5\' 10"'$  (1.778 m)   Wt 259 lb (117.5 kg)   SpO2 96%   BMI 37.16 kg/m  VS noted, mild ill Constitutional: Pt appears in NAD HENT: Head: NCAT.  Bilat tm's with mild erythema.  Max sinus areas mild tender.  Pharynx with mild erythema, no exudate Right Ear: External ear normal.  Left Ear: External ear normal.  Bilat tm's with mild erythema.  Max sinus areas non tender.  Pharynx with mild erythema, no exudate Eyes: . Pupils are equal, round, and reactive to light. Conjunctivae and EOM are normal Nose: without d/c or deformity Neck: Neck supple. Gross normal ROM Cardiovascular: Normal rate and regular rhythm.   Pulmonary/Chest: Effort normal and breath sounds without rales or wheezing.  Neurological: Pt is alert. At baseline orientation, motor grossly intact Skin: Skin is warm. No rashes, other new lesions, no LE edema Psychiatric: Pt behavior is normal without agitation   POCT Influenza A/B    Ref Range & Units 11:57 45yrago  Influenza A, POC Negative Negative  Negative   Influenza B, POC Negative Negative  Negative            Assessment & Plan:

## 2017-09-07 NOTE — Patient Instructions (Addendum)
Please take all new medication as prescribed - the antibiotic, and cough medicine  Please continue all other medications as before, and refills have been done if requested.  Please have the pharmacy call with any other refills you may need.  Please keep your appointments with your specialists as you may have planned      

## 2017-09-07 NOTE — Assessment & Plan Note (Signed)
stable overall by history and exam, recent data reviewed with pt, and pt to continue medical treatment as before,  to f/u any worsening symptoms or concerns Lab Results  Component Value Date   HGBA1C 6.3 08/22/2017

## 2017-09-07 NOTE — Assessment & Plan Note (Signed)
stable overall by history and exam, recent data reviewed with pt, and pt to continue medical treatment as before,  to f/u any worsening symptoms or concerns BP Readings from Last 3 Encounters:  09/07/17 126/78  08/22/17 118/70  05/17/17 136/78

## 2017-09-11 ENCOUNTER — Other Ambulatory Visit: Payer: Self-pay | Admitting: Internal Medicine

## 2017-09-11 DIAGNOSIS — Z794 Long term (current) use of insulin: Secondary | ICD-10-CM

## 2017-09-11 DIAGNOSIS — E1121 Type 2 diabetes mellitus with diabetic nephropathy: Secondary | ICD-10-CM

## 2017-09-13 DIAGNOSIS — R69 Illness, unspecified: Secondary | ICD-10-CM | POA: Diagnosis not present

## 2017-09-17 ENCOUNTER — Encounter: Payer: Self-pay | Admitting: Cardiovascular Disease

## 2017-09-17 ENCOUNTER — Ambulatory Visit: Payer: Medicare HMO | Admitting: Cardiovascular Disease

## 2017-09-17 VITALS — BP 138/72 | HR 77 | Ht 70.0 in | Wt 248.5 lb

## 2017-09-17 DIAGNOSIS — R0602 Shortness of breath: Secondary | ICD-10-CM | POA: Diagnosis not present

## 2017-09-17 DIAGNOSIS — I10 Essential (primary) hypertension: Secondary | ICD-10-CM | POA: Diagnosis not present

## 2017-09-17 DIAGNOSIS — E785 Hyperlipidemia, unspecified: Secondary | ICD-10-CM | POA: Diagnosis not present

## 2017-09-17 DIAGNOSIS — I471 Supraventricular tachycardia, unspecified: Secondary | ICD-10-CM

## 2017-09-17 NOTE — Patient Instructions (Addendum)

## 2017-09-17 NOTE — Progress Notes (Signed)
Cardiology Office Note    Date:  09/17/2017   ID:  Matthew Villanueva, DOB [birth year], MRN [id number]  PCP:  [name], MD  Cardiologist:  Dr. Johnsie Cancel   CC:  Nausea   History of Present Illness:  Matthew Villanueva is a 82 y.o. male with a history of SVT s/p ablation (in [place]), HTN, RBBB, obesity, OSA on CPAP, DMT2 Seen by PA 02/2016 for dyspnea with benign w/u.  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 Arthritis   . Diabetes mellitus    NO INSULIN  . Dyspnea   . Gout   . History of colonic polyps   . HTN (hypertension)   . Localized osteoarthrosis not specified whether primary or secondary, hand   . OSA (obstructive sleep apnea)   . Other specified cardiac dysrhythmias(427.89) 2004   ABLATION DUE TO TACHYCARDIA  . Right bundle branch block     Past Surgical History:  Procedure Laterality Date  . CARDIAC ELECTROPHYSIOLOGY STUDY AND ABLATION  2004   ablation for SVT with a chronic right bundle branch block   . [place]  . PILONIDAL CYST DRAINAGE      Current Medications: Outpatient Medications Prior to Visit  Medication Sig Dispense Refill  . aspirin 81 MG tablet Take 81 mg by mouth daily.      . blood glucose meter kit and supplies KIT Use to test blood sugar up to twice daily. 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 colchicine 0.6 MG tablet Take 1.2 mg by mouth daily as needed (gout). Take an additional 0.6 mg by mouth two (2) hours later if needed again for gout.    . empagliflozin (JARDIANCE) 25 MG TABS tablet Take 25 mg by mouth daily. 90 tablet 0  . gabapentin (NEURONTIN) 100 MG capsule  Take 1 capsule (100 mg total) by mouth 3 (three) times daily. 270 capsule 1  . glucose blood (COOL BLOOD GLUCOSE TEST STRIPS) test strip Use to test blood sugar up to twice daily. DX: E11.21 100 each 12  . Lancets (ONETOUCH ULTRASOFT) lancets Use to test blood sugar up to twice daily. DX: E11.21 100 each 12  . losartan-hydrochlorothiazide (HYZAAR) 100-25 MG tablet TAKE 1 TABLET BY MOUTH DAILY 90 tablet 0  . meclizine (ANTIVERT) 12.5 MG tablet Take 1 tablet (12.5 mg total) by mouth 3 (three) times daily as needed for dizziness. 60 tablet 0  . Omega-3 Fatty Acids (FISH OIL) 1000 MG CPDR Take 1,000 mg by mouth daily.    . sildenafil (REVATIO) 20 MG tablet Take 20 mg by mouth as directed.    Marland Kitchen azithromycin (ZITHROMAX Z-PAK) 250 MG tablet 2 tab by mouth day 1, then 1 per day (Patient not taking: Reported on 09/17/2017) 6 tablet 1  . HYDROcodone-homatropine (HYCODAN) 5-1.5 MG/5ML syrup Take 5 mLs by mouth every 6 (six) hours as needed for up to 10 days for cough. (Patient not taking: Reported on 09/17/2017) 180 mL 0  . JARDIANCE 25 MG TABS tablet TAKE 1 TABLET (25 MG TOTAL) BY MOUTH DAILY (Patient not taking: Reported on  09/17/2017) 90 tablet 0  . sildenafil (REVATIO) 20 MG tablet TAKE ONE TABLET BY MOUTH DAILY AS NEEDED (Patient not taking: Reported on 09/17/2017) 10 tablet 10   No facility-administered medications prior to visit.      Allergies:   Livalo [pitavastatin]; Crestor [rosuvastatin]; Metformin and related; and Pravastatin   Social History   Socioeconomic History  . Marital status: Single    Spouse name: Not on file  . Number of children: Not on file  . Years of education: 65  . Highest education level: Not on file  Occupational History  . Occupation: self employed, Chemical engineer: SELF-EMPLOYED  Social Needs  . Financial resource strain: Not on file  . Food insecurity:    Worry: Not on file    Inability: Not on file  . Transportation needs:    Medical: Not on file     Non-medical: Not on file  Tobacco Use  . Smoking status: Former Smoker    Packs/day: 2.00    Years: 30.00    Pack years: 60.00    Types: Cigarettes    Last attempt to quit: 05/15/1984    Years since quitting: 33.3  . Smokeless tobacco: Never Used  Substance and Sexual Activity  . Alcohol use: No    Comment: rare use  . Drug use: No  . Sexual activity: Not Currently  Lifestyle  . Physical activity:    Days per week: Not on file    Minutes per session: Not on file  . Stress: Not on file  Relationships  . Social connections:    Talks on phone: Not on file    Gets together: Not on file    Attends religious service: Not on file    Active member of club or organization: Not on file    Attends meetings of clubs or organizations: Not on file    Relationship status: Not on file  Other Topics Concern  . Not on file  Social History Narrative   [employer], [place]   Single   Work: self employed...Recruitment consultant   I-ADLs   End-of-Life: Yes-CPR, Yes-short-term Mechanical Ventilation, but no prolonged ventilation. Is willing to undergo dialysis, prolonged tub feeding.   Pt states former smoker. 1986. 2ppd. Started age 11     Family History:  The patient's family history includes Cancer in his father; Diabetes in his maternal uncle; Heart attack in his maternal grandmother; Heart disease in his maternal aunt, maternal grandfather, maternal grandmother, and mother; Kidney cancer in his sister.     ROS:   Please see the history of present illness.    ROS All other systems reviewed and are negative.   PHYSICAL EXAM:   VS:  BP 138/72   Pulse 77   Ht '5\' 10"'$  (1.778 m)   Wt 248 lb 8 oz (112.7 kg)   SpO2 94%   BMI 35.66 kg/m    Affect appropriate Overweight white male  HEENT: normal Neck supple with no adenopathy JVP normal no bruits no thyromegaly Lungs clear with no wheezing and good diaphragmatic motion Heart:  S1/S2 no murmur, no rub, gallop or click PMI normal Abdomen:  benighn, BS positve, no tenderness, no AAA no bruit.  No HSM or HJR Distal pulses intact with no bruits No edema Neuro non-focal Skin warm and dry No muscular weakness  Wt Readings from Last 3 Encounters:  09/17/17 248 lb 8 oz (112.7 kg)  09/07/17 259 lb (117.5 kg)  08/22/17 252 lb  4 oz (114.4 kg)      Studies/Labs Reviewed:   EKG:   03/07/16  SR RBBB PVC rate 79 09/18/17 SR rate 74 PVC,PAC RBBB   Recent Labs: 05/17/2017: BUN 15; Creatinine, Ser 0.99; Potassium 3.8; Sodium 137   Lipid Panel    Component Value Date/Time   CHOL 155 09/12/2016 1635   TRIG 166.0 (H) 05/17/2017 0924   HDL 40.40 09/12/2016 1635   CHOLHDL 4 09/12/2016 1635   VLDL 69.4 (H) 09/12/2016 1635   LDLCALC 72 12/24/2013 1141   LDLDIRECT 88.0 09/12/2016 1635    Additional studies/ records that were reviewed today include:  ETT/Myoview 04/2013 Conclusions PACs develop during recovery only. No evidence of exercise induced ischemia. Hypertensive response to exercise , otherwise normal stress test.   ASSESSMENT & PLAN:   SOB: Improved normal echo and exam doubt cardiac etiology    PAC;s : Benign in setting of non ischemic myovue and normal EF   HTN: Well controlled.  Continue current medications and low sodium Dash type diet.    RBBB: this has been stable over time. Yearly ECG   SVT s/p ablation: no recent palpitations .  DMT2: most recent HgA1c 7.0. Goal <6.5 needs to change oral agent current causing nausea and diarrhea   OSA: continue CPAP   Jenkins Rouge, MD

## 2017-10-08 ENCOUNTER — Other Ambulatory Visit: Payer: Self-pay | Admitting: Internal Medicine

## 2017-10-08 DIAGNOSIS — E1140 Type 2 diabetes mellitus with diabetic neuropathy, unspecified: Secondary | ICD-10-CM

## 2017-10-23 DIAGNOSIS — R69 Illness, unspecified: Secondary | ICD-10-CM | POA: Diagnosis not present

## 2017-11-07 DIAGNOSIS — E119 Type 2 diabetes mellitus without complications: Secondary | ICD-10-CM | POA: Diagnosis not present

## 2017-11-07 DIAGNOSIS — H35361 Drusen (degenerative) of macula, right eye: Secondary | ICD-10-CM | POA: Diagnosis not present

## 2017-11-07 DIAGNOSIS — H2513 Age-related nuclear cataract, bilateral: Secondary | ICD-10-CM | POA: Diagnosis not present

## 2017-11-07 DIAGNOSIS — H25013 Cortical age-related cataract, bilateral: Secondary | ICD-10-CM | POA: Diagnosis not present

## 2017-11-07 DIAGNOSIS — H35033 Hypertensive retinopathy, bilateral: Secondary | ICD-10-CM | POA: Diagnosis not present

## 2017-11-07 LAB — HM DIABETES EYE EXAM

## 2017-11-08 LAB — HM DIABETES FOOT EXAM

## 2017-11-26 ENCOUNTER — Other Ambulatory Visit: Payer: Self-pay | Admitting: *Deleted

## 2017-11-26 MED ORDER — LOSARTAN POTASSIUM-HCTZ 100-25 MG PO TABS
1.0000 | ORAL_TABLET | Freq: Every day | ORAL | 0 refills | Status: DC
Start: 2017-11-26 — End: 2018-02-20

## 2017-11-27 ENCOUNTER — Encounter: Payer: Self-pay | Admitting: Family

## 2017-11-27 ENCOUNTER — Ambulatory Visit (INDEPENDENT_AMBULATORY_CARE_PROVIDER_SITE_OTHER): Payer: Medicare HMO | Admitting: Family

## 2017-11-27 VITALS — BP 124/78 | HR 58 | Temp 98.7°F | Ht 70.0 in | Wt 253.1 lb

## 2017-11-27 DIAGNOSIS — M1000 Idiopathic gout, unspecified site: Secondary | ICD-10-CM

## 2017-11-27 MED ORDER — COLCHICINE 0.6 MG PO TABS: 1.2000 mg | ORAL_TABLET | Freq: Every day | ORAL | 1 refills | Status: DC | PRN

## 2017-11-27 NOTE — Progress Notes (Signed)
Matthew Villanueva is a 82 y.o. male with the following history as recorded in [employer]:  Patient Active Problem List   Diagnosis Date Noted  . Acute sinus infection 09/07/2017  . Pure hyperglyceridemia 11/09/2015  . OSA (obstructive sleep apnea) 11/09/2015  . Statin intolerance 06/10/2014  . Chronic painful diabetic neuropathy ([place]) 12/24/2013  . Other male erectile dysfunction 12/24/2013  . 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  . Hyperlipidemia with target LDL less than 100 08/12/2013  . BPH associated with nocturia 08/12/2013  . Obesity, Class II, BMI 35-39.9 05/24/2011  . Routine health maintenance 05/24/2011  . HEARING LOSS 04/28/2009  . DM (diabetes mellitus), type 2 with renal complications ([place]) 30/94/0768  . DJD of shoulder 07/23/2007  . Gout 06/25/2007  . Essential hypertension 06/25/2007    Current Outpatient Medications  Medication Sig Dispense Refill  . aspirin 81 MG tablet Take 81 mg by mouth daily.      . blood glucose meter kit and supplies KIT Use to test blood sugar up to twice daily. 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 colchicine 0.6 MG tablet Take 2 tablets (1.2 mg total) by mouth daily as needed (gout). Take an additional 0.6 mg by mouth two (2) hours later as needed 30 tablet 1  . empagliflozin (JARDIANCE) 25 MG TABS tablet Take 25 mg by mouth daily. 90 tablet 0  . gabapentin (NEURONTIN) 100 MG capsule TAKE ONE CAPSULE BY MOUTH THREE TIMES A DAY 270 capsule 0  . glucose blood (COOL BLOOD GLUCOSE TEST STRIPS) test strip Use to test blood sugar up to twice daily. DX: E11.21 100 each 12  . Lancets (ONETOUCH ULTRASOFT) lancets Use to test blood sugar up to twice daily. DX: E11.21 100 each 12  . losartan-hydrochlorothiazide (HYZAAR) 100-25 MG tablet Take 1 tablet by mouth daily. 90 tablet 0  . meclizine (ANTIVERT) 12.5 MG tablet Take 1 tablet (12.5 mg total) by mouth 3 (three) times daily as needed for dizziness. 60 tablet 0  . Omega-3 Fatty Acids (FISH OIL) 1000 MG CPDR Take 1,000 mg by  mouth daily.    . sildenafil (REVATIO) 20 MG tablet Take 20 mg by mouth as directed.     No current facility-administered medications for this visit.     Allergies: Livalo [pitavastatin]; Crestor [rosuvastatin]; Metformin and related; and Pravastatin  Past Medical History:  Diagnosis Date  . Arthritis   . Diabetes mellitus    NO INSULIN  . Dyspnea   . Gout   . History of colonic polyps   . HTN (hypertension)   . Localized osteoarthrosis not specified whether primary or secondary, hand   . OSA (obstructive sleep apnea)   . Other specified cardiac dysrhythmias(427.89) 2004   ABLATION DUE TO TACHYCARDIA  . Right bundle branch block     Past Surgical History:  Procedure Laterality Date  . CARDIAC ELECTROPHYSIOLOGY STUDY AND ABLATION  2004   ablation for SVT with a chronic right bundle branch block   . [place]  . PILONIDAL CYST DRAINAGE      Family History  Problem Relation Age of Onset  . Cancer Father        throat and tongue  . Heart disease Mother   . Kidney cancer Sister   . Diabetes Maternal Uncle   . Heart attack Maternal Grandmother   . Heart disease Maternal Grandmother        CAD/MI  . Heart disease Maternal Grandfather   . Heart disease Maternal Aunt  CAD    Social History   Tobacco Use  . Smoking status: Former Smoker    Packs/day: 2.00    Years: 30.00    Pack years: 60.00    Types: Cigarettes    Last attempt to quit: 05/15/1984    Years since quitting: 33.5  . Smokeless tobacco: Never Used  Substance Use Topics  . Alcohol use: No    Comment: rare use    Subjective:  Patient started with gout flare on Saturday; admits he had been eating increased amounts of red meat and feels this was trigger; responded well to Colchicine but prescription did not have a refill; has noted good improvement in amount of swelling in his left foot after just 2 pills of Colchicine;  Also requesting samples of Jardiance if available; planning to see his  PCP next week for diabetes follow-up;  Objective:  Vitals:   11/27/17 1139  BP: 124/78  Pulse: (!) 58  Temp: 98.7 F (37.1 C)  TempSrc: Oral  SpO2: 94%  Weight: 253 lb 1.3 oz (114.8 kg)  Height: '5\' 10"'$  (1.778 m)    General: Well developed, well nourished, in no acute distress  Skin : Warm and dry.  Head: Normocephalic and atraumatic  Lungs: Respirations unlabored;  Musculoskeletal: No deformities; swelling, erythema left foot at base of 1st toe  Extremities: No edema, cyanosis, clubbing  Vessels: Symmetric bilaterally  Neurologic: Alert and oriented; speech intact; face symmetrical; moves all extremities well; CNII-XII intact without focal deficit  Assessment:  1. Idiopathic gout, unspecified chronicity, unspecified site     Plan:  Good response to Colchicine; refill updated; call back if still having symptoms by the end of the week; will not do labs today as he is scheduled for CPE with his provider next week.   No follow-ups on file.  No orders of the defined types were placed in this encounter.   Requested Prescriptions   Signed Prescriptions Disp Refills  . colchicine 0.6 MG tablet 30 tablet 1    Sig: Take 2 tablets (1.2 mg total) by mouth daily as needed (gout). Take an additional 0.6 mg by mouth two (2) hours later as needed

## 2017-12-03 ENCOUNTER — Encounter: Payer: Self-pay | Admitting: Internal Medicine

## 2017-12-03 ENCOUNTER — Other Ambulatory Visit (INDEPENDENT_AMBULATORY_CARE_PROVIDER_SITE_OTHER): Payer: Medicare HMO

## 2017-12-03 ENCOUNTER — Ambulatory Visit (INDEPENDENT_AMBULATORY_CARE_PROVIDER_SITE_OTHER): Payer: Medicare HMO | Admitting: Internal Medicine

## 2017-12-03 VITALS — BP 120/70 | HR 64 | Temp 97.6°F | Resp 16 | Ht 70.0 in | Wt 254.0 lb

## 2017-12-03 DIAGNOSIS — E781 Pure hyperglyceridemia: Secondary | ICD-10-CM

## 2017-12-03 DIAGNOSIS — I10 Essential (primary) hypertension: Secondary | ICD-10-CM

## 2017-12-03 DIAGNOSIS — N401 Enlarged prostate with lower urinary tract symptoms: Secondary | ICD-10-CM | POA: Diagnosis not present

## 2017-12-03 DIAGNOSIS — R200 Anesthesia of skin: Secondary | ICD-10-CM

## 2017-12-03 DIAGNOSIS — M1000 Idiopathic gout, unspecified site: Secondary | ICD-10-CM | POA: Diagnosis not present

## 2017-12-03 DIAGNOSIS — E785 Hyperlipidemia, unspecified: Secondary | ICD-10-CM | POA: Diagnosis not present

## 2017-12-03 DIAGNOSIS — R202 Paresthesia of skin: Secondary | ICD-10-CM

## 2017-12-03 DIAGNOSIS — E1121 Type 2 diabetes mellitus with diabetic nephropathy: Secondary | ICD-10-CM | POA: Diagnosis not present

## 2017-12-03 DIAGNOSIS — R351 Nocturia: Secondary | ICD-10-CM | POA: Diagnosis not present

## 2017-12-03 LAB — CBC WITH DIFFERENTIAL/PLATELET
BASOS ABS: 0.1 10*3/uL (ref 0.0–0.1)
Basophils Relative: 0.9 % (ref 0.0–3.0)
EOS ABS: 0.2 10*3/uL (ref 0.0–0.7)
Eosinophils Relative: 1.9 % (ref 0.0–5.0)
HCT: 50.6 % (ref 39.0–52.0)
Hemoglobin: 17.6 g/dL — ABNORMAL HIGH (ref 13.0–17.0)
LYMPHS ABS: 2 10*3/uL (ref 0.7–4.0)
Lymphocytes Relative: 23.5 % (ref 12.0–46.0)
MCHC: 34.7 g/dL (ref 30.0–36.0)
MCV: 87.5 fl (ref 78.0–100.0)
MONO ABS: 0.9 10*3/uL (ref 0.1–1.0)
Monocytes Relative: 10.6 % (ref 3.0–12.0)
NEUTROS PCT: 63.1 % (ref 43.0–77.0)
Neutro Abs: 5.4 10*3/uL (ref 1.4–7.7)
PLATELETS: 210 10*3/uL (ref 150.0–400.0)
RBC: 5.79 Mil/uL (ref 4.22–5.81)
RDW: 14.6 % (ref 11.5–15.5)
WBC: 8.6 10*3/uL (ref 4.0–10.5)

## 2017-12-03 LAB — LIPID PANEL
CHOL/HDL RATIO: 3
Cholesterol: 137 mg/dL (ref 0–200)
HDL: 39.1 mg/dL (ref >39.00)
LDL CALC: 65 mg/dL (ref 0–99)
NonHDL: 97.46
TRIGLYCERIDES: 162 mg/dL — AB (ref 0.0–149.0)
VLDL: 32.4 mg/dL (ref 0.0–40.0)

## 2017-12-03 LAB — COMPREHENSIVE METABOLIC PANEL
ALBUMIN: 3.8 g/dL (ref 3.5–5.2)
ALK PHOS: 19 U/L — AB (ref 39–117)
ALT: 26 U/L (ref 0–53)
AST: 17 U/L (ref 0–37)
BUN: 19 mg/dL (ref 6–23)
CALCIUM: 9.4 mg/dL (ref 8.4–10.5)
CO2: 26 mEq/L (ref 19–32)
CREATININE: 1.03 mg/dL (ref 0.40–1.50)
Chloride: 100 mEq/L (ref 96–112)
GFR: 73.9 mL/min (ref >60.00)
Glucose, Bld: 142 mg/dL — ABNORMAL HIGH (ref 70–99)
Potassium: 3.9 mEq/L (ref 3.5–5.1)
SODIUM: 135 meq/L (ref 135–145)
TOTAL PROTEIN: 7 g/dL (ref 6.0–8.3)
Total Bilirubin: 0.6 mg/dL (ref 0.2–1.2)

## 2017-12-03 LAB — FOLATE: Folate: >24.1 ng/mL (ref >5.9)

## 2017-12-03 LAB — URIC ACID: Uric Acid, Serum: 6.3 mg/dL (ref 4.0–7.8)

## 2017-12-03 LAB — VITAMIN B12: VITAMIN B 12: 291 pg/mL (ref 211–911)

## 2017-12-03 LAB — PSA: PSA: 2.86 ng/mL (ref 0.10–4.00)

## 2017-12-03 LAB — HEMOGLOBIN A1C: HEMOGLOBIN A1C: 7.4 % — AB (ref 4.6–6.5)

## 2017-12-03 LAB — TSH: TSH: 2.18 u[IU]/mL (ref 0.35–4.50)

## 2017-12-03 NOTE — Patient Instructions (Addendum)
Health Maintenance, Male A healthy lifestyle and preventive care is important for your health and wellness. Ask your health care provider about what schedule of regular examinations is right for you. What should I know about weight and diet? Eat a Healthy Diet  Eat plenty of vegetables, fruits, whole grains, low-fat dairy products, and lean protein.  Do not eat a lot of foods high in solid fats, added sugars, or salt.  Maintain a Healthy Weight Regular exercise can help you achieve or maintain a healthy weight. You should:  Do at least 150 minutes of exercise each week. The exercise should increase your heart rate and make you sweat (moderate-intensity exercise).  Do strength-training exercises at least twice a week.  Watch Your Levels of Cholesterol and Blood Lipids  Have your blood tested for lipids and cholesterol every 5 years starting at 80 years of age. If you are at high risk for heart disease, you should start having your blood tested when you are 80 years old. You may need to have your cholesterol levels checked more often if: ? Your lipid or cholesterol levels are high. ? You are older than 80 years of age. ? You are at high risk for heart disease.  What should I know about cancer screening? Many types of cancers can be detected early and may often be prevented. Lung Cancer  You should be screened every year for lung cancer if: ? You are a current smoker who has smoked for at least 30 years. ? You are a former smoker who has quit within the past 15 years.  Talk to your health care provider about your screening options, when you should start screening, and how often you should be screened.  Colorectal Cancer  Routine colorectal cancer screening usually begins at 80 years of age and should be repeated every 5-10 years until you are 80 years old. You may need to be screened more often if early forms of precancerous polyps or small growths are found. Your health care provider  may recommend screening at an earlier age if you have risk factors for colon cancer.  Your health care provider may recommend using home test kits to check for hidden blood in the stool.  A small camera at the end of a tube can be used to examine your colon (sigmoidoscopy or colonoscopy). This checks for the earliest forms of colorectal cancer.  Prostate and Testicular Cancer  Depending on your age and overall health, your health care provider may do certain tests to screen for prostate and testicular cancer.  Talk to your health care provider about any symptoms or concerns you have about testicular or prostate cancer.  Skin Cancer  Check your skin from head to toe regularly.  Tell your health care provider about any new moles or changes in moles, especially if: ? There is a change in a mole's size, shape, or color. ? You have a mole that is larger than a pencil eraser.  Always use sunscreen. Apply sunscreen liberally and repeat throughout the day.  Protect yourself by wearing long sleeves, pants, a wide-brimmed hat, and sunglasses when outside.  What should I know about heart disease, diabetes, and high blood pressure?  If you are 18-39 years of age, have your blood pressure checked every 3-5 years. If you are 40 years of age or older, have your blood pressure checked every year. You should have your blood pressure measured twice-once when you are at a hospital or clinic, and once when   you are not at a hospital or clinic. Record the average of the two measurements. To check your blood pressure when you are not at a hospital or clinic, you can use: ? An automated blood pressure machine at a pharmacy. ? A home blood pressure monitor.  Talk to your health care provider about your target blood pressure.  If you are between 71-7 years old, ask your health care provider if you should take aspirin to prevent heart disease.  Have regular diabetes screenings by checking your fasting blood  sugar level. ? If you are at a normal weight and have a low risk for diabetes, have this test once every three years after the age of 37. ? If you are overweight and have a high risk for diabetes, consider being tested at a younger age or more often.  A one-time screening for abdominal aortic aneurysm (AAA) by ultrasound is recommended for men aged 40-75 years who are current or former smokers. What should I know about preventing infection? Hepatitis B If you have a higher risk for hepatitis B, you should be screened for this virus. Talk with your health care provider to find out if you are at risk for hepatitis B infection. Hepatitis C Blood testing is recommended for:  Everyone born from [birth year] through 1963.  Anyone with known risk factors for hepatitis C.  Sexually Transmitted Diseases (STDs)  You should be screened each year for STDs including gonorrhea and chlamydia if: ? You are sexually active and are younger than 80 years of age. ? You are older than 80 years of age and your health care provider tells you that you are at risk for this type of infection. ? Your sexual activity has changed since you were last screened and you are at an increased risk for chlamydia or gonorrhea. Ask your health care provider if you are at risk.  Talk with your health care provider about whether you are at high risk of being infected with HIV. Your health care provider may recommend a prescription medicine to help prevent HIV infection.  What else can I do?  Schedule regular health, dental, and eye exams.  Stay current with your vaccines (immunizations).  Do not use any tobacco products, such as cigarettes, chewing tobacco, and e-cigarettes. If you need help quitting, ask your health care provider.  Limit alcohol intake to no more than 2 drinks per day. One drink equals 12 ounces of beer, 5 ounces of wine, or 1 ounces of hard liquor.  Do not use street drugs.  Do not share needles.  Ask your  health care provider for help if you need support or information about quitting drugs.  Tell your health care provider if you often feel depressed.  Tell your health care provider if you have ever been abused or do not feel safe at home. This information is not intended to replace advice given to you by your health care provider. Make sure you discuss any questions you have with your health care provider. Document Released: 10/28/2007 Document Revised: 12/29/2015 Document Reviewed: 02/02/2015 Elsevier Interactive Patient Education  2018 [employer].  Type 2 Diabetes Mellitus, Diagnosis, Adult Type 2 diabetes (type 2 diabetes mellitus) is a long-term (chronic) disease. In type 2 diabetes, one or both of these problems may be present:  The pancreas does not make enough of a hormone called insulin.  Cells in the body do not respond properly to insulin that the body makes (insulin resistance).  Normally, insulin allows  blood sugar (glucose) to enter cells in the body. The cells use glucose for energy. Insulin resistance or lack of insulin causes excess glucose to build up in the blood instead of going into cells. As a result, high blood glucose (hyperglycemia) develops. What increases the risk? The following factors may make you more likely to develop type 2 diabetes:  Having a family member with type 2 diabetes.  Being overweight or obese.  Having an inactive (sedentary) lifestyle.  Having been diagnosed with insulin resistance.  Having a history of prediabetes, gestational diabetes, or polycystic ovarian syndrome (PCOS).  Being of American-Indian, African-American, Hispanic/Latino, or Asian/Pacific Islander descent.  What are the signs or symptoms? In the early stage of this condition, you may not have symptoms. Symptoms develop slowly and may include:  Increased thirst (polydipsia).  Increased hunger(polyphagia).  Increased urination (polyuria).  Increased urination during  the night (nocturia).  Unexplained weight loss.  Frequent infections that keep coming back (recurring).  Fatigue.  Weakness.  Vision changes, such as blurry vision.  Cuts or bruises that are slow to heal.  Tingling or numbness in the hands or feet.  Dark patches on the skin (acanthosis nigricans).  How is this diagnosed?  This condition is diagnosed based on your symptoms, your medical history, a physical exam, and your blood glucose level. Your blood glucose may be checked with one or more of the following blood tests:  A fasting blood glucose (FBG) test. You will not be allowed to eat (you will fast) for at least 8 hours before a blood sample is taken.  A random blood glucose test. This checks blood glucose at any time of day regardless of when you ate.  An A1c (hemoglobin A1c) blood test. This provides information about blood glucose control over the previous 2-3 months.  An oral glucose tolerance test (OGTT). This measures your blood glucose at two times: ? After fasting. This is your baseline blood glucose level. ? Two hours after drinking a beverage that contains glucose.  You may be diagnosed with type 2 diabetes if:  Your FBG level is 126 mg/dL (7.0 mmol/L) or higher.  Your random blood glucose level is 200 mg/dL (11.1 mmol/L) or higher.  Your A1c level is 6.5% or higher.  Your OGGT result is higher than 200 mg/dL (11.1 mmol/L).  These blood tests may be repeated to confirm your diagnosis. How is this treated?  Your treatment may be managed by a specialist called an endocrinologist. Type 2 diabetes may be treated by following instructions from your health care provider about:  Making diet and lifestyle changes. This may include: ? Following an individualized nutrition plan that is developed by a diet and nutrition specialist (registered dietitian). ? Exercising regularly. ? Finding ways to manage stress.  Checking your blood glucose level as often as  directed.  Taking diabetes medicines or insulin daily. This helps to keep your blood glucose levels in the healthy range. ? If you use insulin, you may need to adjust the dosage depending on how physically active you are and what foods you eat. Your health care provider will tell you how to adjust your dosage.  Taking medicines to help prevent complications from diabetes, such as: ? Aspirin. ? Medicine to lower cholesterol. ? Medicine to control blood pressure.  Your health care provider will set individualized treatment goals for you. Your goals will be based on your age, other medical conditions you have, and how you respond to diabetes treatment. Generally, the goal  of treatment is to maintain the following blood glucose levels:  Before meals (preprandial): 80-130 mg/dL (4.4-7.2 mmol/L).  After meals (postprandial): below 180 mg/dL (10 mmol/L).  A1c level: less than 7%.  Follow these instructions at home: Questions to [place] Provider Consider asking the following questions:  Do I need to meet with a diabetes educator?  Where can I find a support group for people with diabetes?  What equipment will I need to manage my diabetes at home?  What diabetes medicines do I need, and when should I take them?  How often do I need to check my blood glucose?  What number can I call if I have questions?  When is my next appointment?  General instructions  Take over-the-counter and prescription medicines only as told by your health care provider.  Keep all follow-up visits as told by your health care provider. This is important.  For more information about diabetes, visit: ? American Diabetes Association (ADA): www.diabetes.org ? American Association of Diabetes Educators (AADE): www.diabeteseducator.org/patient-resources Contact a health care provider if:  Your blood glucose is at or above 240 mg/dL (13.3 mmol/L) for 2 days in a row.  You have been sick or have had  a fever for 2 days or longer and you are not getting better.  You have any of the following problems for more than 6 hours: ? You cannot eat or drink. ? You have nausea and vomiting. ? You have diarrhea. Get help right away if:  Your blood glucose is lower than 54 mg/dL (3.0 mmol/L).  You become confused or you have trouble thinking clearly.  You have difficulty breathing.  You have moderate or large ketone levels in your urine. This information is not intended to replace advice given to you by your health care provider. Make sure you discuss any questions you have with your health care provider. Document Released: 05/01/2005 Document Revised: 10/07/2015 Document Reviewed: 06/04/2015 Elsevier Interactive Patient Education  [employer].

## 2017-12-03 NOTE — Progress Notes (Signed)
Subjective:  Patient ID: Matthew Villanueva, male    DOB: 1935/10/16  Age: 82 y.o. MRN: [id number]  CC: Hypertension; Hyperlipidemia; and Diabetes   HPI Matthew Villanueva presents for f/up.  He complains of numbness in both feet for several years.  He tried gabapentin for this but says it does not help.  He does not experience foot pain.  He tells me his blood sugars have been well controlled.  He denies any polys.  Continues to have a rash on his back.  He tells me this is being treated by dermatologist with a topical agent he thinks that the steroid.  He has been told its eczema but he does not think it has ever been biopsied.   Past Medical History:  Diagnosis Date  . Arthritis   . Diabetes mellitus    NO INSULIN  . Dyspnea   . Gout   . History of colonic polyps   . HTN (hypertension)   . Localized osteoarthrosis not specified whether primary or secondary, hand   . OSA (obstructive sleep apnea)   . Other specified cardiac dysrhythmias(427.89) 2004   ABLATION DUE TO TACHYCARDIA  . Right bundle branch block    Past Surgical History:  Procedure Laterality Date  . CARDIAC ELECTROPHYSIOLOGY STUDY AND ABLATION  2004   ablation for SVT with a chronic right bundle branch block   . [place]  . PILONIDAL CYST DRAINAGE      reports that he quit smoking about 33 years ago. His smoking use included cigarettes. He has a 60.00 pack-year smoking history. He has never used smokeless tobacco. He reports that he does not drink alcohol or use drugs. family history includes Cancer in his father; Diabetes in his maternal uncle; Heart attack in his maternal grandmother; Heart disease in his maternal aunt, maternal grandfather, maternal grandmother, and mother; Kidney cancer in his sister. Allergies  Allergen Reactions  . Livalo [Pitavastatin] Other (See Comments)    Muscle aches  . Crestor [Rosuvastatin]     Muscle aches  . Metformin And Related Diarrhea  . Pravastatin      Muscle aches    Outpatient Medications Prior to Visit  Medication Sig Dispense Refill  . aspirin 81 MG tablet Take 81 mg by mouth daily.      . blood glucose meter kit and supplies KIT Use to test blood sugar up to twice daily. 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 colchicine 0.6 MG tablet Take 2 tablets (1.2 mg total) by mouth daily as needed (gout). Take an additional 0.6 mg by mouth two (2) hours later as needed 30 tablet 1  . empagliflozin (JARDIANCE) 25 MG TABS tablet Take 25 mg by mouth daily. 90 tablet 0  . glucose blood (COOL BLOOD GLUCOSE TEST STRIPS) test strip Use to test blood sugar up to twice daily. DX: E11.21 100 each 12  . Lancets (ONETOUCH ULTRASOFT) lancets Use to test blood sugar up to twice daily. DX: E11.21 100 each 12  . losartan-hydrochlorothiazide (HYZAAR) 100-25 MG tablet Take 1 tablet by mouth daily. 90 tablet 0  . meclizine (ANTIVERT) 12.5 MG tablet Take 1 tablet (12.5 mg total) by mouth 3 (three) times daily as needed for dizziness. 60 tablet 0  . Omega-3 Fatty Acids (FISH OIL) 1000 MG CPDR Take 1,000 mg by mouth daily.    . sildenafil (REVATIO) 20 MG tablet Take 20 mg by mouth as directed.    . gabapentin (NEURONTIN) 100 MG capsule TAKE  ONE CAPSULE BY MOUTH THREE TIMES A DAY 270 capsule 0   No facility-administered medications prior to visit.     ROS Review of Systems  Constitutional: Negative.  Negative for appetite change, diaphoresis, fatigue and fever.  HENT: Negative.   Eyes: Negative for visual disturbance.  Respiratory: Positive for apnea. Negative for cough, chest tightness, shortness of breath and wheezing.   Cardiovascular: Negative for chest pain, palpitations and leg swelling.  Gastrointestinal: Negative for abdominal pain, constipation, diarrhea, nausea and vomiting.  Endocrine: Negative.  Negative for polydipsia, polyphagia and polyuria.  Genitourinary: Negative.  Negative for difficulty urinating, dysuria, penile swelling, scrotal swelling, testicular  pain and urgency.  Musculoskeletal: Negative.  Negative for arthralgias and myalgias.  Skin: Negative.   Neurological: Positive for numbness. Negative for dizziness, weakness, light-headedness and headaches.       +numbness in both feet, gabapentin has not helped  Hematological: Negative for adenopathy. Does not bruise/bleed easily.  Psychiatric/Behavioral: Negative.     Objective:  BP 120/70 (BP Location: Left Arm, Patient Position: Sitting, Cuff Size: Large)   Pulse 64   Temp 97.6 F (36.4 C) (Oral)   Resp 16   Ht _0  (1.778 m)   Wt 254 lb (115.2 kg)   SpO2 94%   BMI 36.45 kg/m   BP Readings from Last 3 Encounters:  12/04/17 112/78  12/03/17 120/70  11/27/17 124/78    Wt Readings from Last 3 Encounters:  12/04/17 252 lb (114.3 kg)  12/03/17 254 lb (115.2 kg)  11/27/17 253 lb 1.3 oz (114.8 kg)    Physical Exam  Constitutional: He is oriented to person, place, and time. No distress.  HENT:  Mouth/Throat: Oropharynx is clear and moist. No oropharyngeal exudate.  Eyes: Conjunctivae are normal. No scleral icterus.  Neck: Normal range of motion. Neck supple. No JVD present. No thyromegaly present.  Cardiovascular: Normal rate, regular rhythm and normal heart sounds.  Occasional extrasystoles are present.  EKG ----  Sinus  Rhythm  - occasional ectopic ventricular beat    -Right bundle branch block.   -Old inferior infarct.   ABNORMAL   Pulmonary/Chest: Effort normal and breath sounds normal. He has no wheezes. He has no rales.  Abdominal: Soft. Bowel sounds are normal. He exhibits no mass. There is no hepatosplenomegaly. There is no tenderness. Hernia confirmed negative in the right inguinal area and confirmed negative in the left inguinal area.  Genitourinary: Rectum normal, testes normal and penis normal. Rectal exam shows no external hemorrhoid, no internal hemorrhoid, no fissure, no mass, no tenderness, anal tone normal and guaiac negative stool. Prostate is  enlarged (2+ smooth symm BPH). Prostate is not tender. Right testis shows no mass, no swelling and no tenderness. Right testis is descended. Left testis shows no mass, no swelling and no tenderness. Left testis is descended. Uncircumcised. No phimosis, paraphimosis, hypospadias, penile erythema or penile tenderness. No discharge found.  Musculoskeletal: Normal range of motion. He exhibits no edema, tenderness or deformity.       Back:  Lymphadenopathy:    He has no cervical adenopathy. No inguinal adenopathy noted on the right or left side.  Neurological: He is alert and oriented to person, place, and time.  Skin: Skin is warm and dry. No rash noted. He is not diaphoretic.  Psychiatric: He has a normal mood and affect. His behavior is normal. Judgment and thought content normal.  Vitals reviewed.   Lab Results  Component Value Date   WBC 8.6 12/03/2017  HGB 17.6 (H) 12/03/2017   HCT 50.6 12/03/2017   PLT 210.0 12/03/2017   GLUCOSE 142 (H) 12/03/2017   CHOL 137 12/03/2017   TRIG 162.0 (H) 12/03/2017   HDL 39.10 12/03/2017   LDLDIRECT 88.0 09/12/2016   LDLCALC 65 12/03/2017   ALT 26 12/03/2017   AST 17 12/03/2017   NA 135 12/03/2017   K 3.9 12/03/2017   CL 100 12/03/2017   CREATININE 1.03 12/03/2017   BUN 19 12/03/2017   CO2 26 12/03/2017   TSH 2.18 12/03/2017   PSA 2.86 12/03/2017   HGBA1C 7.4 (H) 12/03/2017   MICROALBUR 6.9 (H) 05/17/2017    Ct Angio Chest W/cm &/or Wo Cm  Result Date: 03/09/2016 CLINICAL DATA:  Shortness of breath with exertion EXAM: CT ANGIOGRAPHY CHEST WITH CONTRAST TECHNIQUE: Multidetector CT imaging of the chest was performed using the standard protocol during bolus administration of intravenous contrast. Multiplanar CT image reconstructions and MIPs were obtained to evaluate the vascular anatomy. CONTRAST:  80 mL Isovue 370. COMPARISON:  None. FINDINGS: Cardiovascular: Thoracic aorta demonstrates some calcific changes without definitive aneurysmal  dilatation. Opacification is somewhat limited although no definitive dissection is seen. The pulmonary artery is well visualized and demonstrates a normal branching pattern. No filling defect to suggest pulmonary embolism is identified. Moderate coronary calcifications are seen. The cardiac structures appear within normal limits. Mediastinum/Nodes: Multiple calcified hilar and mediastinal lymph nodes are noted consistent with prior granulomatous disease. No significant lymphadenopathy is noted. The thoracic inlet shows no acute abnormality. No axillary adenopathy is seen. Lungs/Pleura: The lungs are well aerated bilaterally. Scattered subpleural nodular densities are noted within the right lung as well as the left upper lobe. The left upper lobe nodule measures approximately 7 mm. The largest of the right lung nodules measures approximately 7 mm lying with right middle lobe. No pneumothorax or sizable effusion is noted. Upper Abdomen: No acute abnormality. Musculoskeletal: Degenerative changes of the thoracic spine are noted. Review of the MIP images confirms the above findings. IMPRESSION: Bilateral pulmonary nodules which are noncalcified in nature. Given the calcified lymph nodes in the hila and mediastinum these statistically represent noncalcified granulomas although Non-contrast chest CT at 3-6 months is recommended. If the nodules are stable at time of repeat CT, then future CT at 18-24 months (from today's scan) is considered optional for low-risk patients, but is recommended for high-risk patients. This recommendation follows the consensus statement: Guidelines for Management of Incidental Pulmonary Nodules Detected on CT Images: From the Fleischner Society 2017; Radiology 2017; 284:228-243. No evidence of pulmonary emboli. Changes of prior granulomatous disease. Electronically Signed   By: Inez Catalina M.D.   On: 03/09/2016 09:07    Assessment & Plan:   Amery was seen today for hypertension,  hyperlipidemia and diabetes.  Diagnoses and all orders for this visit:  Type 2 diabetes mellitus with diabetic nephropathy, without long-term current use of insulin ([place])- His A1c is at 7.4%.  His blood sugars are adequately well controlled. -     Hemoglobin A1c; Future -     Comprehensive metabolic panel; Future  Essential hypertension- His blood pressure is well controlled.  Electrolytes and renal function are normal. -     CBC with Differential/Platelet; Future -     TSH; Future -     Comprehensive metabolic panel; Future -     EKG 12-Lead  Morbid obesity ([place])- He is working on his lifestyle modifications to lose weight.  Hyperlipidemia with target LDL less than 100- His ASCVD  risk score is elevated at 48% but he is not willing to take a statin for CV risk reduction. -     Lipid panel; Future -     Comprehensive metabolic panel; Future  Pure hyperglyceridemia- Improvement noted.  Will continue the omega-3 fish oils. -     Lipid panel; Future  BPH associated with nocturia- His PSA is low which is reassuring that he does not have prostate cancer.  He has no symptoms that need to be treated. -     PSA; Future  Idiopathic gout, unspecified chronicity, unspecified site- He has had one episode of gouty arthropathy and has a relatively normal uric acid level.  At this time I do not recommend starting a xanthine oxidase inhibitor.  If he has another episode then will reconsider. -     Uric acid; Future  Numbness and tingling of both feet- His B12 and folate are normal.  He has painless diabetic neuropathy.  Gabapentin did not help so have asked him to stop taking it. -     Folate; Future -     Vitamin B12; Future   I have discontinued Teagan C. Dewberry's gabapentin. I am also having him maintain his aspirin, Fish Oil, meclizine, empagliflozin, onetouch ultrasoft, glucose blood, blood glucose meter kit and supplies, sildenafil, losartan-hydrochlorothiazide, and colchicine.  No orders of  the defined types were placed in this encounter.  See AVS for instructions about healthy living and anticipatory guidance.  Follow-up: Return in about 6 months (around 06/05/2018).  Scarlette Calico, MD

## 2017-12-03 NOTE — Progress Notes (Addendum)
Subjective:   Matthew Villanueva is a 82 y.o. male who presents for Medicare Annual/Subsequent preventive examination.  Review of Systems:  No ROS.  Medicare Wellness Visit. Additional risk factors are reflected in the social history.  Cardiac Risk Factors include: diabetes mellitus;dyslipidemia;male gender;hypertension Sleep patterns: feels rested on waking, gets up 2-3 times nightly to void and sleeps 7-8 hours nightly.    Home Safety/Smoke Alarms: Feels safe in home. Smoke alarms in place.  Living environment; residence and Firearm Safety: 1-story house/ trailer, no firearms. Lives alone, no needs for DME, good support system Seat Belt Safety/Bike Helmet: Wears seat belt.   PSA-  Lab Results  Component Value Date   PSA 2.86 12/03/2017   PSA 2.87 10/19/2015   PSA 1.88 08/12/2013       Objective:    Vitals: BP 112/78   Pulse 67   Resp 18   Ht '5\' 10"'$  (1.778 m)   Wt 252 lb (114.3 kg)   SpO2 97%   BMI 36.16 kg/m   Body mass index is 36.16 kg/m.  Advanced Directives 12/04/2017 11/29/2016 03/06/2016 11/11/2015 11/09/2015  Does Patient Have a Medical Advance Directive? Yes No;Yes No Yes Yes  Type of Paramedic of [place];Living will - - [place];Living will [place];Living will  Does patient want to make changes to medical advance directive? - - - No - Patient declined No - Patient declined  Copy of [place] in Chart? No - copy requested - - No - copy requested No - copy requested  Would patient like information on creating a medical advance directive? - Yes (ED - Information included in AVS) - - -    Tobacco Social History   Tobacco Use  Smoking Status Former Smoker  . Packs/day: 2.00  . Years: 30.00  . Pack years: 60.00  . Types: Cigarettes  . Last attempt to quit: 05/15/1984  . Years since quitting: 33.5  Smokeless Tobacco Never Used     Counseling given: Not Answered  Past  Medical History:  Diagnosis Date  . Arthritis   . Diabetes mellitus    NO INSULIN  . Dyspnea   . Gout   . History of colonic polyps   . HTN (hypertension)   . Localized osteoarthrosis not specified whether primary or secondary, hand   . OSA (obstructive sleep apnea)   . Other specified cardiac dysrhythmias(427.89) 2004   ABLATION DUE TO TACHYCARDIA  . Right bundle branch block    Past Surgical History:  Procedure Laterality Date  . CARDIAC ELECTROPHYSIOLOGY STUDY AND ABLATION  2004   ablation for SVT with a chronic right bundle branch block   . [place]  . PILONIDAL CYST DRAINAGE     Family History  Problem Relation Age of Onset  . Cancer Father        throat and tongue  . Heart disease Mother   . Kidney cancer Sister   . Diabetes Maternal Uncle   . Heart attack Maternal Grandmother   . Heart disease Maternal Grandmother        CAD/MI  . Heart disease Maternal Grandfather   . Heart disease Maternal Aunt        CAD   Social History   Socioeconomic History  . Marital status: Single    Spouse name: Not on file  . Number of children: Not on file  . Years of education: 57  . Highest education level:  Not on file  Occupational History  . Occupation: self employed, Chemical engineer: SELF-EMPLOYED  Social Needs  . Financial resource strain: Not hard at all  . Food insecurity:    Worry: Never true    Inability: Never true  . Transportation needs:    Medical: No    Non-medical: No  Tobacco Use  . Smoking status: Former Smoker    Packs/day: 2.00    Years: 30.00    Pack years: 60.00    Types: Cigarettes    Last attempt to quit: 05/15/1984    Years since quitting: 33.5  . Smokeless tobacco: Never Used  Substance and Sexual Activity  . Alcohol use: No    Comment: rare use  . Drug use: No  . Sexual activity: Not Currently  Lifestyle  . Physical activity:    Days per week: 0 days    Minutes per session: 0 min  . Stress: To some extent    Relationships  . Social connections:    Talks on phone: More than three times a week    Gets together: More than three times a week    Attends religious service: Not on file    Active member of club or organization: Not on file    Attends meetings of clubs or organizations: Not on file    Relationship status: Not on file  Other Topics Concern  . Not on file  Social History Narrative   HSG, [place]   Single   Work: self employed...Recruitment consultant   I-ADLs   End-of-Life: Yes-CPR, Yes-short-term Mechanical Ventilation, but no prolonged ventilation. Is willing to undergo dialysis, prolonged tub feeding.   Pt states former smoker. 1986. 2ppd. Started age 34    Outpatient Encounter Medications as of 12/04/2017  Medication Sig  . aspirin 81 MG tablet Take 81 mg by mouth daily.    . blood glucose meter kit and supplies KIT Use to test blood sugar up to twice daily. DX: E11.21  . colchicine 0.6 MG tablet Take 2 tablets (1.2 mg total) by mouth daily as needed (gout). Take an additional 0.6 mg by mouth two (2) hours later as needed  . empagliflozin (JARDIANCE) 25 MG TABS tablet Take 25 mg by mouth daily.  Marland Kitchen glucose blood (COOL BLOOD GLUCOSE TEST STRIPS) test strip Use to test blood sugar up to twice daily. DX: E11.21  . Lancets (ONETOUCH ULTRASOFT) lancets Use to test blood sugar up to twice daily. DX: E11.21  . losartan-hydrochlorothiazide (HYZAAR) 100-25 MG tablet Take 1 tablet by mouth daily.  . meclizine (ANTIVERT) 12.5 MG tablet Take 1 tablet (12.5 mg total) by mouth 3 (three) times daily as needed for dizziness.  . Omega-3 Fatty Acids (FISH OIL) 1000 MG CPDR Take 1,000 mg by mouth daily.  . sildenafil (REVATIO) 20 MG tablet Take 20 mg by mouth as directed.   No facility-administered encounter medications on file as of 12/04/2017.     Activities of Daily Living In your present state of health, do you have any difficulty performing the following activities: 12/04/2017  Hearing? N   Vision? N  Difficulty concentrating or making decisions? N  Walking or climbing stairs? N  Dressing or bathing? N  Doing errands, shopping? N  Preparing Food and eating ? N  Using the Toilet? N  In the past six months, have you accidently leaked urine? N  Do you have problems with loss of bowel control? N  Managing your Medications? N  Managing your Finances? N  Housekeeping or managing your Housekeeping? N  Some recent data might be hidden    Patient Care Team: [name], MD as PCP - General (Internal Medicine) Chesley Mires, MD (Pulmonary Disease) Monna Fam, MD (Ophthalmology)   Assessment:   This is a routine wellness examination for Jalen. Physical assessment deferred to PCP.   Exercise Activities and Dietary recommendations Current Exercise Habits: The patient does not participate in regular exercise at present, Exercise limited by: None identified  Diet (meal preparation, eat out, water intake, caffeinated beverages, dairy products, fruits and vegetables): in general, a "healthy" diet  , well balanced   Reviewed heart healthy and diabetic diet. Encouraged patient to increase daily water and fluid intake.  Goals    . Patient Stated     Get back into the routine of walking. I plan to go [facility] and walk 2-3 weekly. Decrease the amount of carbohydrates I eat by perhaps not eating carbohydrates at one meal daily.        Fall Risk Fall Risk  12/04/2017 12/03/2017 11/29/2016 08/17/2016 11/11/2015  Falls in the past year? No No No No No    Depression Screen PHQ 2/9 Scores 12/04/2017 12/03/2017 11/29/2016 08/17/2016  PHQ - 2 Score 0 0 0 0  PHQ- 9 Score - - 2 -    Cognitive Function MMSE - Mini Mental State Exam 12/04/2017  Orientation to time 5  Orientation to Place 5  Registration 3  Attention/ Calculation 5  Recall 3  Language- name 2 objects 2  Language- repeat 1  Language- follow 3 step command 3  Language- read & follow direction 1  Write a sentence 1   Copy design 1  Total score 30        Immunization History  Administered Date(s) Administered  . H1N1 04/21/2008  . Influenza Whole 04/01/2007, 02/21/2010, 02/13/2011, 03/18/2012  . Influenza, High Dose Seasonal PF 03/06/2016, 01/10/2017  . Influenza,inj,Quad PF,6+ Mos 01/20/2013, 05/04/2015  . Pneumococcal Conjugate-13 08/12/2013  . Pneumococcal Polysaccharide-23 03/07/2004, 05/17/2017  . Td 07/04/2004  . Tdap 05/04/2015  . Zoster 04/29/2010   Screening Tests Health Maintenance  Topic Date Due  . INFLUENZA VACCINE  12/13/2017  . HEMOGLOBIN A1C  06/05/2018  . OPHTHALMOLOGY EXAM  11/08/2018  . FOOT EXAM  12/04/2018  . TETANUS/TDAP  05/03/2025  . PNA vac Low Risk Adult  Completed      Plan:    Continue doing brain stimulating activities (puzzles, reading, adult coloring books, staying active) to keep memory sharp.   Continue to eat heart healthy diet (full of fruits, vegetables, whole grains, lean protein, water--limit salt, fat, and sugar intake) and increase physical activity as tolerated.  I have personally reviewed and noted the following in the patient's chart:   . Medical and social history . Use of alcohol, tobacco or illicit drugs  . Current medications and supplements . Functional ability and status . Nutritional status . Physical activity . Advanced directives . List of other physicians . Vitals . Screenings to include cognitive, depression, and falls . Referrals and appointments  In addition, I have reviewed and discussed with patient certain preventive protocols, quality metrics, and best practice recommendations. A written personalized care plan for preventive services as well as general preventive health recommendations were provided to patient.   Medical screening examination/treatment/procedure(s) were performed by non-physician practitioner and as supervising physician I was immediately available for consultation/collaboration. I agree with above.  Scarlette Calico, MD   Argie Ramming  Wine, RN  12/04/2017

## 2017-12-04 ENCOUNTER — Ambulatory Visit (INDEPENDENT_AMBULATORY_CARE_PROVIDER_SITE_OTHER): Payer: Medicare HMO | Admitting: *Deleted

## 2017-12-04 ENCOUNTER — Encounter: Payer: Self-pay | Admitting: Internal Medicine

## 2017-12-04 VITALS — BP 112/78 | HR 67 | Resp 18 | Ht 70.0 in | Wt 252.0 lb

## 2017-12-04 DIAGNOSIS — Z0000 Encounter for general adult medical examination without abnormal findings: Secondary | ICD-10-CM

## 2017-12-04 NOTE — Patient Instructions (Signed)
Continue doing brain stimulating activities (puzzles, reading, adult coloring books, staying active) to keep memory sharp.   Continue to eat heart healthy diet (full of fruits, vegetables, whole grains, lean protein, water--limit salt, fat, and sugar intake) and increase physical activity as tolerated.   Matthew Villanueva , Thank you for taking time to come for your Medicare Wellness Visit. I appreciate your ongoing commitment to your health goals. Please review the following plan we discussed and let me know if I can assist you in the future.   These are the goals we discussed: Goals    . Patient Stated     Get back into the routine of walking. I plan to go [facility] and walk 2-3 weekly. Decrease the amount of carbohydrates I eat by perhaps not eating carbohydrates at one meal daily.        This is a list of the screening recommended for you and due dates:  Health Maintenance  Topic Date Due  . Flu Shot  12/13/2017  . Hemoglobin A1C  06/05/2018  . Eye exam for diabetics  11/08/2018  . Complete foot exam   12/04/2018  . Tetanus Vaccine  05/03/2025  . Pneumonia vaccines  Completed

## 2017-12-14 ENCOUNTER — Encounter: Payer: Self-pay | Admitting: Family

## 2017-12-14 ENCOUNTER — Ambulatory Visit (INDEPENDENT_AMBULATORY_CARE_PROVIDER_SITE_OTHER): Payer: Medicare HMO | Admitting: Family

## 2017-12-14 VITALS — BP 124/78 | HR 66 | Temp 97.6°F | Ht 70.0 in | Wt 253.1 lb

## 2017-12-14 DIAGNOSIS — M109 Gout, unspecified: Secondary | ICD-10-CM | POA: Diagnosis not present

## 2017-12-14 DIAGNOSIS — E1140 Type 2 diabetes mellitus with diabetic neuropathy, unspecified: Secondary | ICD-10-CM

## 2017-12-14 MED ORDER — PREDNISONE 20 MG PO TABS: 20.0000 mg | ORAL_TABLET | Freq: Every day | ORAL | 0 refills | Status: DC

## 2017-12-14 MED ORDER — GABAPENTIN 100 MG PO CAPS: 100.0000 mg | ORAL_CAPSULE | Freq: Three times a day (TID) | ORAL | 1 refills | Status: DC

## 2017-12-14 NOTE — Progress Notes (Signed)
Matthew Villanueva is a 82 y.o. male with the following history as recorded in EpicCare:  Patient Active Problem List   Diagnosis Date Noted  . Numbness and tingling of both feet 12/03/2017  . Pure hyperglyceridemia 11/09/2015  . OSA (obstructive sleep apnea) 11/09/2015  . Statin intolerance 06/10/2014  . Chronic painful diabetic neuropathy ([place]) 12/24/2013  . Other male erectile dysfunction 12/24/2013  . 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  . Hyperlipidemia with target LDL less than 100 08/12/2013  . BPH associated with nocturia 08/12/2013  . Morbid obesity ([place]) 05/24/2011  . Routine health maintenance 05/24/2011  . HEARING LOSS 04/28/2009  . DM (diabetes mellitus), type 2 with renal complications ([place]) 93/81/8299  . DJD of shoulder 07/23/2007  . Gout 06/25/2007  . Essential hypertension 06/25/2007    Current Outpatient Medications  Medication Sig Dispense Refill  . aspirin 81 MG tablet Take 81 mg by mouth daily.      . blood glucose meter kit and supplies KIT Use to test blood sugar up to twice daily. 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 colchicine 0.6 MG tablet Take 2 tablets (1.2 mg total) by mouth daily as needed (gout). Take an additional 0.6 mg by mouth two (2) hours later as needed 30 tablet 1  . empagliflozin (JARDIANCE) 25 MG TABS tablet Take 25 mg by mouth daily. 90 tablet 0  . glucose blood (COOL BLOOD GLUCOSE TEST STRIPS) test strip Use to test blood sugar up to twice daily. DX: E11.21 100 each 12  . Lancets (ONETOUCH ULTRASOFT) lancets Use to test blood sugar up to twice daily. DX: E11.21 100 each 12  . losartan-hydrochlorothiazide (HYZAAR) 100-25 MG tablet Take 1 tablet by mouth daily. 90 tablet 0  . meclizine (ANTIVERT) 12.5 MG tablet Take 1 tablet (12.5 mg total) by mouth 3 (three) times daily as needed for dizziness. 60 tablet 0  . Omega-3 Fatty Acids (FISH OIL) 1000 MG CPDR Take 1,000 mg by mouth daily.    . sildenafil (REVATIO) 20 MG tablet Take 20 mg by mouth as directed.    .  gabapentin (NEURONTIN) 100 MG capsule Take 1 capsule (100 mg total) by mouth 3 (three) times daily. 270 capsule 1  . predniSONE (DELTASONE) 20 MG tablet Take 1 tablet (20 mg total) by mouth daily with breakfast. 5 tablet 0   No current facility-administered medications for this visit.     Allergies: Livalo [pitavastatin]; Crestor [rosuvastatin]; Metformin and related; and Pravastatin  Past Medical History:  Diagnosis Date  . Arthritis   . Diabetes mellitus    NO INSULIN  . Dyspnea   . Gout   . History of colonic polyps   . HTN (hypertension)   . Localized osteoarthrosis not specified whether primary or secondary, hand   . OSA (obstructive sleep apnea)   . Other specified cardiac dysrhythmias(427.89) 2004   ABLATION DUE TO TACHYCARDIA  . Right bundle branch block     Past Surgical History:  Procedure Laterality Date  . CARDIAC ELECTROPHYSIOLOGY STUDY AND ABLATION  2004   ablation for SVT with a chronic right bundle branch block   . [place]  . PILONIDAL CYST DRAINAGE      Family History  Problem Relation Age of Onset  . Cancer Father        throat and tongue  . Heart disease Mother   . Kidney cancer Sister   . Diabetes Maternal Uncle   . Heart attack Maternal Grandmother   . Heart disease  Maternal Grandmother        CAD/MI  . Heart disease Maternal Grandfather   . Heart disease Maternal Aunt        CAD    Social History   Tobacco Use  . Smoking status: Former Smoker    Packs/day: 2.00    Years: 30.00    Pack years: 60.00    Types: Cigarettes    Last attempt to quit: 05/15/1984    Years since quitting: 33.6  . Smokeless tobacco: Never Used  Substance Use Topics  . Alcohol use: No    Comment: rare use    Subjective:  Patient was seen with acute gout attack on 11/27/2017- started on Colchicine; did not feel that attack ever cleared completely; saw his PCP on July 22 and symptoms improving; Uric acid at that time was 6.3- opted against need for daily  treatment. Wonders about using prednisone to clear completely; leaving for [facility] in the next week.    Objective:  Vitals:   12/14/17 1310  BP: 124/78  Pulse: 66  Temp: 97.6 F (36.4 C)  TempSrc: Oral  SpO2: 95%  Weight: 253 lb 1.9 oz (114.8 kg)  Height: '5\' 10"'$  (1.778 m)    General: Well developed, well nourished, in no acute distress  Skin : Warm and dry.  Head: Normocephalic and atraumatic  Lungs: Respirations unlabored;  Musculoskeletal: No deformities; mild inflammation noted over left foot at base of 1st toe Extremities: No edema, cyanosis, clubbing  Vessels: Symmetric bilaterally  Neurologic: Alert and oriented; speech intact; face symmetrical; moves all extremities well; CNII-XII intact without focal deficit  Assessment:  1. Acute gout, unspecified cause, unspecified site   2. Chronic painful diabetic neuropathy ([place])     Plan:  1. Rx for prednisone 20 mg qd x 5 days; if no improvement by Monday, return for x-ray; follow-up as needed; if symptoms persist, will need to consider daily preventive medication. 2. Patient has decided he would like to re-start Neurontin; refill updated;   No follow-ups on file.  Orders Placed This Encounter  Procedures  . DG Foot Complete Left    Standing Status:   Future    Standing Expiration Date:   02/14/2019    Order Specific Question:   Reason for Exam (SYMPTOM  OR DIAGNOSIS REQUIRED)    Answer:   left foot pain    Order Specific Question:   Preferred imaging location?    Answer:   [facility]    Order Specific Question:   Radiology Contrast Protocol - do NOT remove file path    Answer:   [email or web address]    Requested Prescriptions   Signed Prescriptions Disp Refills  . predniSONE (DELTASONE) 20 MG tablet 5 tablet 0    Sig: Take 1 tablet (20 mg total) by mouth daily with breakfast.  . gabapentin (NEURONTIN) 100 MG capsule 270 capsule 1    Sig: Take 1 capsule (100 mg total) by mouth  3 (three) times daily.

## 2018-01-18 DIAGNOSIS — R69 Illness, unspecified: Secondary | ICD-10-CM | POA: Diagnosis not present

## 2018-02-01 DIAGNOSIS — R69 Illness, unspecified: Secondary | ICD-10-CM | POA: Diagnosis not present

## 2018-02-08 DIAGNOSIS — R69 Illness, unspecified: Secondary | ICD-10-CM | POA: Diagnosis not present

## 2018-02-20 ENCOUNTER — Other Ambulatory Visit: Payer: Self-pay | Admitting: Internal Medicine

## 2018-03-12 DIAGNOSIS — R69 Illness, unspecified: Secondary | ICD-10-CM | POA: Diagnosis not present

## 2018-03-26 ENCOUNTER — Other Ambulatory Visit: Payer: Self-pay | Admitting: Internal Medicine

## 2018-03-26 DIAGNOSIS — E1121 Type 2 diabetes mellitus with diabetic nephropathy: Secondary | ICD-10-CM

## 2018-03-26 DIAGNOSIS — N528 Other male erectile dysfunction: Secondary | ICD-10-CM

## 2018-03-26 DIAGNOSIS — Z794 Long term (current) use of insulin: Secondary | ICD-10-CM

## 2018-03-27 DIAGNOSIS — R69 Illness, unspecified: Secondary | ICD-10-CM | POA: Diagnosis not present

## 2018-04-12 ENCOUNTER — Encounter: Payer: Self-pay | Admitting: Internal Medicine

## 2018-04-12 ENCOUNTER — Ambulatory Visit (INDEPENDENT_AMBULATORY_CARE_PROVIDER_SITE_OTHER): Payer: Medicare HMO | Admitting: Internal Medicine

## 2018-04-12 VITALS — BP 116/78 | HR 87 | Temp 97.8°F | Resp 18 | Wt 254.0 lb

## 2018-04-12 DIAGNOSIS — J209 Acute bronchitis, unspecified: Secondary | ICD-10-CM | POA: Diagnosis not present

## 2018-04-12 MED ORDER — AZITHROMYCIN 250 MG PO TABS: ORAL_TABLET | ORAL | 0 refills | Status: DC

## 2018-04-12 NOTE — Progress Notes (Signed)
Subjective:    Patient ID: Matthew Villanueva, male    DOB: 1935-06-04, 82 y.o.   MRN: [id number]  HPI He is here for an acute visit for cold symptoms.  His symptoms started a few days ago  He is experiencing subjective fever last night, sore throat that lasted 1 day-currently does not have that, hoarseness, productive cough of yellow sputum and nausea yesterday.  He denies chills, nasal congestion, ear pain, sinus pain, shortness of breath, wheezing, body aches and headaches.  He has tried taking old prescription cough syrup     Medications and allergies reviewed with patient and updated if appropriate.  Patient Active Problem List   Diagnosis Date Noted  . Numbness and tingling of both feet 12/03/2017  . Pure hyperglyceridemia 11/09/2015  . OSA (obstructive sleep apnea) 11/09/2015  . Statin intolerance 06/10/2014  . Chronic painful diabetic neuropathy ([place]) 12/24/2013  . Other male erectile dysfunction 12/24/2013  . 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  . Hyperlipidemia with target LDL less than 100 08/12/2013  . BPH associated with nocturia 08/12/2013  . Morbid obesity ([place]) 05/24/2011  . Routine health maintenance 05/24/2011  . HEARING LOSS 04/28/2009  . DM (diabetes mellitus), type 2 with renal complications ([place]) 12/45/8099  . DJD of shoulder 07/23/2007  . Gout 06/25/2007  . Essential hypertension 06/25/2007    Current Outpatient Medications on File Prior to Visit  Medication Sig Dispense Refill  . aspirin 81 MG tablet Take 81 mg by mouth daily.      . blood glucose meter kit and supplies KIT Use to test blood sugar up to twice daily. 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 colchicine 0.6 MG tablet Take 2 tablets (1.2 mg total) by mouth daily as needed (gout). Take an additional 0.6 mg by mouth two (2) hours later as needed 30 tablet 1  . empagliflozin (JARDIANCE) 25 MG TABS tablet Take 25 mg by mouth daily. 90 tablet 0  . gabapentin (NEURONTIN) 100 MG capsule Take 1 capsule (100 mg total)  by mouth 3 (three) times daily. 270 capsule 1  . glucose blood (COOL BLOOD GLUCOSE TEST STRIPS) test strip Use to test blood sugar up to twice daily. DX: E11.21 100 each 12  . JARDIANCE 25 MG TABS tablet TAKE ONE TABLET BY MOUTH DAILY 90 tablet 1  . Lancets (ONETOUCH ULTRASOFT) lancets Use to test blood sugar up to twice daily. DX: E11.21 100 each 12  . losartan-hydrochlorothiazide (HYZAAR) 100-25 MG tablet TAKE ONE TABLET BY MOUTH DAILY 90 tablet 1  . meclizine (ANTIVERT) 12.5 MG tablet Take 1 tablet (12.5 mg total) by mouth 3 (three) times daily as needed for dizziness. 60 tablet 0  . Omega-3 Fatty Acids (FISH OIL) 1000 MG CPDR Take 1,000 mg by mouth daily.    . predniSONE (DELTASONE) 20 MG tablet Take 1 tablet (20 mg total) by mouth daily with breakfast. 5 tablet 0  . sildenafil (REVATIO) 20 MG tablet Take 20 mg by mouth as directed.    . sildenafil (REVATIO) 20 MG tablet TAKE ONE TABLET BY MOUTH ONCE DAILY AS NEEDED 10 tablet 9   No current facility-administered medications on file prior to visit.     Past Medical History:  Diagnosis Date  . Arthritis   . Diabetes mellitus    NO INSULIN  . Dyspnea   . Gout   . History of colonic polyps   . HTN (hypertension)   . Localized osteoarthrosis not specified whether primary or secondary, hand   .  OSA (obstructive sleep apnea)   . Other specified cardiac dysrhythmias(427.89) 2004   ABLATION DUE TO TACHYCARDIA  . Right bundle branch block     Past Surgical History:  Procedure Laterality Date  . CARDIAC ELECTROPHYSIOLOGY STUDY AND ABLATION  2004   ablation for SVT with a chronic right bundle branch block   . [place]  . PILONIDAL CYST DRAINAGE      Social History   Socioeconomic History  . Marital status: Single    Spouse name: Not on file  . Number of children: Not on file  . Years of education: 41  . Highest education level: Not on file  Occupational History  . Occupation: self employed, Art gallery manager: SELF-EMPLOYED  Social Needs  . Financial resource strain: Not hard at all  . Food insecurity:    Worry: Never true    Inability: Never true  . Transportation needs:    Medical: No    Non-medical: No  Tobacco Use  . Smoking status: Former Smoker    Packs/day: 2.00    Years: 30.00    Pack years: 60.00    Types: Cigarettes    Last attempt to quit: 05/15/1984    Years since quitting: 33.9  . Smokeless tobacco: Never Used  Substance and Sexual Activity  . Alcohol use: No    Comment: rare use  . Drug use: No  . Sexual activity: Not Currently  Lifestyle  . Physical activity:    Days per week: 0 days    Minutes per session: 0 min  . Stress: To some extent  Relationships  . Social connections:    Talks on phone: More than three times a week    Gets together: More than three times a week    Attends religious service: Not on file    Active member of club or organization: Not on file    Attends meetings of clubs or organizations: Not on file    Relationship status: Not on file  Other Topics Concern  . Not on file  Social History Narrative   [employer], [place]   Single   Work: self employed...Recruitment consultant   I-ADLs   End-of-Life: Yes-CPR, Yes-short-term Mechanical Ventilation, but no prolonged ventilation. Is willing to undergo dialysis, prolonged tub feeding.   Pt states former smoker. 1986. 2ppd. Started age 71    Family History  Problem Relation Age of Onset  . Cancer Father        throat and tongue  . Heart disease Mother   . Kidney cancer Sister   . Diabetes Maternal Uncle   . Heart attack Maternal Grandmother   . Heart disease Maternal Grandmother        CAD/MI  . Heart disease Maternal Grandfather   . Heart disease Maternal Aunt        CAD    Review of Systems  Constitutional: Positive for fever (subjective last night). Negative for chills.  HENT: Positive for sore throat (one day) and voice change. Negative for congestion, ear pain and sinus pain.    Respiratory: Positive for cough (productive of yellow sputum). Negative for shortness of breath and wheezing.   Gastrointestinal: Positive for nausea (yesterday). Negative for diarrhea.  Musculoskeletal: Negative for myalgias.  Neurological: Negative for light-headedness and headaches.       Objective:   Vitals:   04/12/18 1021  BP: 116/78  Pulse: 87  Resp: 18  Temp: 97.8 F (36.6 C)  SpO2:  97%   Filed Weights   04/12/18 1021  Weight: 254 lb (115.2 kg)   Body mass index is 36.45 kg/m.  Wt Readings from Last 3 Encounters:  04/12/18 254 lb (115.2 kg)  12/14/17 253 lb 1.9 oz (114.8 kg)  12/04/17 252 lb (114.3 kg)     Physical Exam GENERAL APPEARANCE: Appears stated age, well appearing, NAD EYES: conjunctiva clear, no icterus HEENT: bilateral tympanic membranes and ear canals normal, oropharynx with mild erythema, no thyromegaly, trachea midline, no cervical or supraclavicular lymphadenopathy LUNGS: Clear to auscultation without wheeze or crackles, unlabored breathing, good air entry bilaterally CARDIOVASCULAR: Normal S1,S2 without murmurs, no edema SKIN: warm, dry        Assessment & Plan:   See Problem List for Assessment and Plan of chronic medical problems.

## 2018-04-12 NOTE — Assessment & Plan Note (Signed)
Symptoms consistent with acute bronchitis, but most likely this is viral in nature He will continue the old cough syrup prescription he has at home Over-the-counter cold medications as needed Rest, fluids I have given him a prescription for a Z-Pak-he will only take this if his symptoms worsen or do not improve after several days.  Discussed that it is better to avoid antibiotics and let his body fight this off if possible Call with any questions or concerns or if no improvement

## 2018-04-12 NOTE — Patient Instructions (Signed)
An antibiotic as prescribed - take it only if your symptoms do not improve or worsen over the next few days.  Use the cough syrup as needed.  Continue over the counter cold medication, advil and tylenol.  Increase your fluids and rest.    Call if no improvement

## 2018-04-23 ENCOUNTER — Encounter: Payer: Self-pay | Admitting: Family

## 2018-04-23 ENCOUNTER — Ambulatory Visit (INDEPENDENT_AMBULATORY_CARE_PROVIDER_SITE_OTHER): Payer: Medicare HMO | Admitting: Family

## 2018-04-23 VITALS — BP 126/72 | HR 60 | Temp 97.6°F | Ht 70.0 in | Wt 253.0 lb

## 2018-04-23 DIAGNOSIS — J9801 Acute bronchospasm: Secondary | ICD-10-CM | POA: Diagnosis not present

## 2018-04-23 MED ORDER — FLUTICASONE PROPIONATE 50 MCG/ACT NA SUSP: 2.0000 | Freq: Every day | NASAL | 6 refills | Status: DC

## 2018-04-23 MED ORDER — PREDNISONE 20 MG PO TABS: 20.0000 mg | ORAL_TABLET | Freq: Every day | ORAL | 0 refills | Status: DC

## 2018-04-23 NOTE — Progress Notes (Signed)
Matthew Villanueva is a 82 y.o. male with the following history as recorded in EpicCare:  Patient Active Problem List   Diagnosis Date Noted  . Numbness and tingling of both feet 12/03/2017  . Pure hyperglyceridemia 11/09/2015  . OSA (obstructive sleep apnea) 11/09/2015  . Acute bronchitis 05/04/2015  . Statin intolerance 06/10/2014  . Chronic painful diabetic neuropathy ([place]) 12/24/2013  . Other male erectile dysfunction 12/24/2013  . 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  . Hyperlipidemia with target LDL less than 100 08/12/2013  . BPH associated with nocturia 08/12/2013  . Morbid obesity ([place]) 05/24/2011  . Routine health maintenance 05/24/2011  . HEARING LOSS 04/28/2009  . DM (diabetes mellitus), type 2 with renal complications ([place]) 23/53/6144  . DJD of shoulder 07/23/2007  . Gout 06/25/2007  . Essential hypertension 06/25/2007    Current Outpatient Medications  Medication Sig Dispense Refill  . aspirin 81 MG tablet Take 81 mg by mouth daily.      . blood glucose meter kit and supplies KIT Use to test blood sugar up to twice daily. 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 colchicine 0.6 MG tablet Take 2 tablets (1.2 mg total) by mouth daily as needed (gout). Take an additional 0.6 mg by mouth two (2) hours later as needed 30 tablet 1  . empagliflozin (JARDIANCE) 25 MG TABS tablet Take 25 mg by mouth daily. 90 tablet 0  . gabapentin (NEURONTIN) 100 MG capsule Take 1 capsule (100 mg total) by mouth 3 (three) times daily. 270 capsule 1  . glucose blood (COOL BLOOD GLUCOSE TEST STRIPS) test strip Use to test blood sugar up to twice daily. DX: E11.21 100 each 12  . JARDIANCE 25 MG TABS tablet TAKE ONE TABLET BY MOUTH DAILY 90 tablet 1  . Lancets (ONETOUCH ULTRASOFT) lancets Use to test blood sugar up to twice daily. DX: E11.21 100 each 12  . losartan-hydrochlorothiazide (HYZAAR) 100-25 MG tablet TAKE ONE TABLET BY MOUTH DAILY 90 tablet 1  . meclizine (ANTIVERT) 12.5 MG tablet Take 1 tablet (12.5 mg total) by  mouth 3 (three) times daily as needed for dizziness. 60 tablet 0  . Omega-3 Fatty Acids (FISH OIL) 1000 MG CPDR Take 1,000 mg by mouth daily.    . sildenafil (REVATIO) 20 MG tablet Take 20 mg by mouth as directed.    . sildenafil (REVATIO) 20 MG tablet TAKE ONE TABLET BY MOUTH ONCE DAILY AS NEEDED 10 tablet 9  . fluticasone (FLONASE) 50 MCG/ACT nasal spray Place 2 sprays into both nostrils daily. 16 g 6  . predniSONE (DELTASONE) 20 MG tablet Take 1 tablet (20 mg total) by mouth daily with breakfast. 5 tablet 0   No current facility-administered medications for this visit.     Allergies: Livalo [pitavastatin]; Crestor [rosuvastatin]; Metformin and related; and Pravastatin  Past Medical History:  Diagnosis Date  . Arthritis   . Diabetes mellitus    NO INSULIN  . Dyspnea   . Gout   . History of colonic polyps   . HTN (hypertension)   . Localized osteoarthrosis not specified whether primary or secondary, hand   . OSA (obstructive sleep apnea)   . Other specified cardiac dysrhythmias(427.89) 2004   ABLATION DUE TO TACHYCARDIA  . Right bundle branch block     Past Surgical History:  Procedure Laterality Date  . CARDIAC ELECTROPHYSIOLOGY STUDY AND ABLATION  2004   ablation for SVT with a chronic right bundle branch block   . [place]  . PILONIDAL  CYST DRAINAGE      Family History  Problem Relation Age of Onset  . Cancer Father        throat and tongue  . Heart disease Mother   . Kidney cancer Sister   . Diabetes Maternal Uncle   . Heart attack Maternal Grandmother   . Heart disease Maternal Grandmother        CAD/MI  . Heart disease Maternal Grandfather   . Heart disease Maternal Aunt        CAD    Social History   Tobacco Use  . Smoking status: Former Smoker    Packs/day: 2.00    Years: 30.00    Pack years: 60.00    Types: Cigarettes    Last attempt to quit: 05/15/1984    Years since quitting: 33.9  . Smokeless tobacco: Never Used  Substance Use Topics   . Alcohol use: No    Comment: rare use    Subjective:  Patient was seen on April 12, 2018 by another provider in our office; felt to have viral bronchitis- symptomatic treatment/ given Rx for Z-pak to hold and fill only if symptoms worsened; has now completed antibiotic and still experiencing dry cough/ hoarseness; no fever, no chest pain, no shortness of breath; "just can't shake the lingering cough." Prone to bronchitis at least once a year; no previous history of asthma;     Objective:  Vitals:   04/23/18 0951  BP: 126/72  Pulse: 60  Temp: 97.6 F (36.4 C)  TempSrc: Oral  SpO2: 95%  Weight: 253 lb (114.8 kg)  Height: '5\' 10"'$  (1.778 m)    General: Well developed, well nourished, in no acute distress  Skin : Warm and dry.  Head: Normocephalic and atraumatic  Eyes: Sclera and conjunctiva clear; pupils round and reactive to light; extraocular movements intact  Ears: External normal; canals clear; tympanic membranes normal  Oropharynx: Pink, supple. No suspicious lesions  Neck: Supple without thyromegaly, adenopathy  Lungs: Respirations unlabored; coarse breathing noted in upper lobes CVS exam: normal rate and regular rhythm.  Neurologic: Alert and oriented; speech intact; face symmetrical; moves all extremities well; CNII-XII intact without focal deficit   Assessment:  1. Acute bronchospasm     Plan:  Do not feel further antibiotics needed; will give trial of Prednisone 20 mg qd x 5 days and Flonase NS; he is encouraged to use the Flonase with regularity especially during the fall when he seems to develop bronchitis; if symptoms persist, will plan to update CXR; follow-up to be determined.   No follow-ups on file.  No orders of the defined types were placed in this encounter.   Requested Prescriptions   Signed Prescriptions Disp Refills  . fluticasone (FLONASE) 50 MCG/ACT nasal spray 16 g 6    Sig: Place 2 sprays into both nostrils daily.  . predniSONE (DELTASONE) 20 MG  tablet 5 tablet 0    Sig: Take 1 tablet (20 mg total) by mouth daily with breakfast.

## 2018-04-23 NOTE — Patient Instructions (Signed)
Bronchospasm, Adult Bronchospasm is when airways in the lungs get smaller. When this happens, it can be hard to breathe. You may cough. You may also make a whistling sound when you breathe (wheeze). Follow these instructions at home: Medicines  Take over-the-counter and prescription medicines only as told by your doctor.  If you need to use an inhaler or nebulizer to take your medicine, ask your doctor how to use it.  If you were given a spacer, always use it with your inhaler. Lifestyle  Change your heating and air conditioning filter. Do this at least once a month.  Try not to use fireplaces and wood stoves.  Do not  smoke. Do not  allow smoking in your home.  Try not to use things that have a strong smell, like perfume.  Get rid of pests (such as roaches and mice) and their poop.  Remove any mold from your home.  Keep your house clean. Get rid of dust.  Use cleaning products that have no smell.  Replace carpet with wood, tile, or vinyl flooring.  Use allergy-proof pillows, mattress covers, and box spring covers.  Wash bed sheets and blankets every week. Use hot water. Dry them in a dryer.  Use blankets that are made of polyester or cotton.  Wash your hands often.  Keep pets out of your bedroom.  When you exercise, try not to breathe in cold air. General instructions  Have a plan for getting medical care. Know these things: ? When to call your doctor. ? When to call local emergency services (911 in the [place] ? Where to go in an emergency.  Stay up to date on your shots (immunizations).  When you have an episode: ? Stay calm. ? Relax. ? Breathe slowly. Contact a doctor if:  Your muscles ache.  Your chest hurts.  The color of the mucus you cough up (sputum) changes from clear or white to yellow, green, gray, or bloody.  The mucus you cough up gets thicker.  You have a fever. Get help right away if:  The whistling sound gets worse, even after you  take your medicines.  Your coughing gets worse.  You find it even harder to breathe.  Your chest hurts very much. Summary  Bronchospasm is when airways in the lungs get smaller.  When this happens, it can be hard to breathe. You may cough. You may also make a whistling sound when you breathe.  Stay away from things that cause you to have episodes. These include smoke or dust. This information is not intended to replace advice given to you by your health care provider. Make sure you discuss any questions you have with your health care provider. Document Released: 02/26/2009 Document Revised: 05/04/2016 Document Reviewed: 05/04/2016 Elsevier Interactive Patient Education  2017 Elsevier Inc.  

## 2018-05-13 DIAGNOSIS — R69 Illness, unspecified: Secondary | ICD-10-CM | POA: Diagnosis not present

## 2018-06-22 ENCOUNTER — Other Ambulatory Visit: Payer: Self-pay | Admitting: Family

## 2018-06-22 DIAGNOSIS — E1140 Type 2 diabetes mellitus with diabetic neuropathy, unspecified: Secondary | ICD-10-CM

## 2018-07-17 DIAGNOSIS — R69 Illness, unspecified: Secondary | ICD-10-CM | POA: Diagnosis not present

## 2018-07-18 DIAGNOSIS — R69 Illness, unspecified: Secondary | ICD-10-CM | POA: Diagnosis not present

## 2018-07-30 ENCOUNTER — Telehealth: Payer: Self-pay

## 2018-07-30 ENCOUNTER — Ambulatory Visit: Payer: Medicare HMO | Admitting: Internal Medicine

## 2018-07-30 NOTE — Telephone Encounter (Signed)
please triage pt

## 2018-07-30 NOTE — Telephone Encounter (Signed)
See phone note, patient has cancelled appt

## 2018-07-30 NOTE — Telephone Encounter (Signed)
I have talked with patient, afebrile, no SOB, no chest pain, no body aches, no headache--no sore throat-no travel-patient has decided to try using OTC allergy medicine and flonase he already has to see if symptoms improve, he will call back to reschedule appt if needed or if symptoms worsen

## 2018-08-23 ENCOUNTER — Other Ambulatory Visit: Payer: Self-pay | Admitting: Internal Medicine

## 2018-09-10 ENCOUNTER — Other Ambulatory Visit: Payer: Self-pay | Admitting: Internal Medicine

## 2018-09-10 DIAGNOSIS — R69 Illness, unspecified: Secondary | ICD-10-CM | POA: Diagnosis not present

## 2018-09-10 DIAGNOSIS — E1121 Type 2 diabetes mellitus with diabetic nephropathy: Secondary | ICD-10-CM

## 2018-09-10 MED ORDER — GLUCOSE BLOOD VI STRP: 1.0000 | ORAL_STRIP | Freq: Two times a day (BID) | 1 refills | Status: DC

## 2018-09-23 DIAGNOSIS — I10 Essential (primary) hypertension: Secondary | ICD-10-CM | POA: Diagnosis not present

## 2018-09-23 DIAGNOSIS — E1165 Type 2 diabetes mellitus with hyperglycemia: Secondary | ICD-10-CM | POA: Diagnosis not present

## 2018-09-23 DIAGNOSIS — R69 Illness, unspecified: Secondary | ICD-10-CM | POA: Diagnosis not present

## 2018-09-23 DIAGNOSIS — Z7984 Long term (current) use of oral hypoglycemic drugs: Secondary | ICD-10-CM | POA: Diagnosis not present

## 2018-09-23 DIAGNOSIS — J309 Allergic rhinitis, unspecified: Secondary | ICD-10-CM | POA: Diagnosis not present

## 2018-09-23 DIAGNOSIS — Z8249 Family history of ischemic heart disease and other diseases of the circulatory system: Secondary | ICD-10-CM | POA: Diagnosis not present

## 2018-09-23 DIAGNOSIS — N529 Male erectile dysfunction, unspecified: Secondary | ICD-10-CM | POA: Diagnosis not present

## 2018-09-23 DIAGNOSIS — Z809 Family history of malignant neoplasm, unspecified: Secondary | ICD-10-CM | POA: Diagnosis not present

## 2018-09-23 DIAGNOSIS — E1142 Type 2 diabetes mellitus with diabetic polyneuropathy: Secondary | ICD-10-CM | POA: Diagnosis not present

## 2018-10-03 ENCOUNTER — Ambulatory Visit: Payer: Medicare HMO | Admitting: Cardiovascular Disease

## 2018-10-03 ENCOUNTER — Telehealth: Payer: Self-pay

## 2018-10-03 NOTE — Telephone Encounter (Signed)

## 2018-10-08 DIAGNOSIS — H25013 Cortical age-related cataract, bilateral: Secondary | ICD-10-CM | POA: Diagnosis not present

## 2018-10-08 DIAGNOSIS — H2513 Age-related nuclear cataract, bilateral: Secondary | ICD-10-CM | POA: Diagnosis not present

## 2018-10-08 DIAGNOSIS — E119 Type 2 diabetes mellitus without complications: Secondary | ICD-10-CM | POA: Diagnosis not present

## 2018-10-08 DIAGNOSIS — H35033 Hypertensive retinopathy, bilateral: Secondary | ICD-10-CM | POA: Diagnosis not present

## 2018-10-08 DIAGNOSIS — H524 Presbyopia: Secondary | ICD-10-CM | POA: Diagnosis not present

## 2018-10-08 LAB — HM DIABETES EYE EXAM

## 2018-10-11 DIAGNOSIS — H5203 Hypermetropia, bilateral: Secondary | ICD-10-CM | POA: Diagnosis not present

## 2018-10-11 DIAGNOSIS — H52209 Unspecified astigmatism, unspecified eye: Secondary | ICD-10-CM | POA: Diagnosis not present

## 2018-10-11 DIAGNOSIS — H524 Presbyopia: Secondary | ICD-10-CM | POA: Diagnosis not present

## 2018-10-11 NOTE — Progress Notes (Signed)
Cardiology Office Note    Date:  10/16/2018   ID:  Matthew Villanueva, DOB 12/27/1935, MRN [id number]  PCP:  Matthew Lima, MD  Cardiologist:  Dr. Johnsie Cancel   CC:  Nausea   History of Present Illness:  Matthew Villanueva is a 83 y.o. 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 Arthritis   . Diabetes mellitus    NO INSULIN  . Dyspnea   . Gout   . History of colonic polyps   . HTN (hypertension)   . Localized osteoarthrosis not specified whether primary or secondary, hand   . OSA (obstructive sleep apnea)   . Other specified cardiac dysrhythmias(427.89) 2004   ABLATION DUE TO TACHYCARDIA  . Right bundle branch block     Past Surgical History:  Procedure Laterality Date  . CARDIAC ELECTROPHYSIOLOGY STUDY AND ABLATION  2004   ablation for SVT with a chronic right bundle branch block   . [place]  . PILONIDAL CYST DRAINAGE      Current Medications: Outpatient Medications Prior to Visit  Medication Sig Dispense Refill  . aspirin 81 MG tablet Take 81 mg by mouth daily.      . blood glucose meter kit and supplies KIT Use to test blood sugar up to twice daily. 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 colchicine 0.6 MG tablet Take 2 tablets (1.2 mg total) by mouth daily as needed (gout). Take an additional 0.6 mg by mouth two (2) hours later as needed 30 tablet 1  . empagliflozin (JARDIANCE) 25 MG TABS tablet Take 25 mg by mouth daily. 90 tablet 0  . fluticasone (FLONASE) 50 MCG/ACT nasal spray Place 2 sprays into both  nostrils daily. 16 g 6  . gabapentin (NEURONTIN) 100 MG capsule TAKE ONE CAPSULE BY MOUTH THREE TIMES A DAY 270 capsule 1  . glucose blood (COOL BLOOD GLUCOSE TEST STRIPS) test strip Use to test blood sugar up to twice daily. DX: E11.21 100 each 12  . glucose blood (ONETOUCH VERIO) test strip 1 each by Other route 2 (two) times daily. Use BID 200 each 1  . JARDIANCE 25 MG TABS tablet TAKE ONE TABLET BY MOUTH DAILY 90 tablet 1  . Lancets (ONETOUCH ULTRASOFT) lancets Use to test blood sugar up to twice daily. DX: E11.21 100 each 12  . losartan-hydrochlorothiazide (HYZAAR) 100-25 MG tablet TAKE ONE TABLET BY MOUTH DAILY 90 tablet 0  . meclizine (ANTIVERT) 12.5 MG tablet Take 1 tablet (12.5 mg total) by mouth 3 (three) times daily as needed for dizziness. 60 tablet 0  . Omega-3 Fatty Acids (FISH OIL) 1000 MG CPDR Take 1,000 mg by mouth daily.    . sildenafil (REVATIO) 20 MG tablet TAKE ONE TABLET BY MOUTH ONCE DAILY AS NEEDED 10 tablet 9   No facility-administered medications prior to visit.      Allergies:   Livalo [pitavastatin]; Crestor [rosuvastatin]; Metformin and related; and Pravastatin   Social History  Socioeconomic History  . Marital status: Single    Spouse name: Not on file  . Number of children: Not on file  . Years of education: 33  . Highest education level: Not on file  Occupational History  . Occupation: self employed, Chemical engineer: SELF-EMPLOYED  Social Needs  . Financial resource strain: Not hard at all  . Food insecurity:    Worry: Never true    Inability: Never true  . Transportation needs:    Medical: No    Non-medical: No  Tobacco Use  . Smoking status: Former Smoker    Packs/day: 2.00    Years: 30.00    Pack years: 60.00    Types: Cigarettes    Last attempt to quit: 05/15/1984    Years since quitting: 34.4  . Smokeless tobacco: Never Used  Substance and Sexual Activity  . Alcohol use: No    Comment: rare use  . Drug use: No  . Sexual  activity: Not Currently  Lifestyle  . Physical activity:    Days per week: 0 days    Minutes per session: 0 min  . Stress: To some extent  Relationships  . Social connections:    Talks on phone: More than three times a week    Gets together: More than three times a week    Attends religious service: Not on file    Active member of club or organization: Not on file    Attends meetings of clubs or organizations: Not on file    Relationship status: Not on file  Other Topics Concern  . Not on file  Social History Narrative   HS[employer], [place]   Single   Work: self employed...Recruitment consultant   I-ADLs   End-of-Life: Yes-CPR, Yes-short-term Mechanical Ventilation, but no prolonged ventilation. Is willing to undergo dialysis, prolonged tub feeding.   Pt states former smoker. 1986. 2ppd. Started age 64     Family History:  The patient's family history includes Cancer in his father; Diabetes in his maternal uncle; Heart attack in his maternal grandmother; Heart disease in his maternal aunt, maternal grandfather, maternal grandmother, and mother; Kidney cancer in his sister.     ROS:   Please see the history of present illness.    ROS All other systems reviewed and are negative.   PHYSICAL EXAM:   VS:  BP 138/70   Pulse 72   Ht '5\' 11"'$  (1.803 m)   Wt 116.6 kg   SpO2 94%   BMI 35.84 kg/m    Affect appropriate Overweight white male  HEENT: normal Neck supple with no adenopathy JVP normal no bruits no thyromegaly Lungs clear with no wheezing and good diaphragmatic motion Heart:  S1/S2 no murmur, no rub, gallop or click PMI normal Abdomen: benighn, BS positve, no tenderness, no AAA no bruit.  No HSM or HJR Distal pulses intact with no bruits No edema Neuro non-focal Skin warm and dry No muscular weakness  Wt Readings from Last 3 Encounters:  10/16/18 116.6 kg  04/23/18 114.8 kg  04/12/18 115.2 kg      Studies/Labs Reviewed:   EKG:   03/07/16  SR RBBB PVC rate 79  09/18/17 SR rate 74 PVC,PAC RBBB   Recent Labs: 12/03/2017: ALT 26; BUN 19; Creatinine, Ser 1.03; Hemoglobin 17.6; Platelets 210.0; Potassium 3.9; Sodium 135; TSH 2.18   Lipid Panel    Component Value Date/Time   CHOL 137 12/03/2017 0926   TRIG 162.0 (H) 12/03/2017 4562  HDL 39.10 12/03/2017 0926   CHOLHDL 3 12/03/2017 0926   VLDL 32.4 12/03/2017 0926   LDLCALC 65 12/03/2017 0926   LDLDIRECT 88.0 09/12/2016 1635    Additional studies/ records that were reviewed today include:  ETT/Myoview 04/2013 Conclusions PACs develop during recovery only. No evidence of exercise induced ischemia. Hypertensive response to exercise , otherwise normal stress test.   ASSESSMENT & PLAN:   SOB: Improved normal echo and exam doubt cardiac etiology    PAC;s : Benign in setting of non ischemic myovue and normal EF   HTN: Well controlled.  Continue current medications and low sodium Dash type diet.    RBBB: this has been stable over time. Yearly ECG   SVT s/p ablation: no recent palpitations .  DMT2: most recent HgA1c 7.0. Goal <6.5 now on jardiance  OSA: continue CPAP   Jenkins Rouge, MD

## 2018-10-16 ENCOUNTER — Other Ambulatory Visit: Payer: Self-pay

## 2018-10-16 ENCOUNTER — Ambulatory Visit: Payer: Medicare HMO | Admitting: Cardiovascular Disease

## 2018-10-16 ENCOUNTER — Encounter: Payer: Self-pay | Admitting: Cardiovascular Disease

## 2018-10-16 ENCOUNTER — Telehealth: Payer: Self-pay | Admitting: Internal Medicine

## 2018-10-16 DIAGNOSIS — E1140 Type 2 diabetes mellitus with diabetic neuropathy, unspecified: Secondary | ICD-10-CM

## 2018-10-16 DIAGNOSIS — E1121 Type 2 diabetes mellitus with diabetic nephropathy: Secondary | ICD-10-CM

## 2018-10-16 DIAGNOSIS — Z794 Long term (current) use of insulin: Secondary | ICD-10-CM

## 2018-10-16 MED ORDER — GABAPENTIN 100 MG PO CAPS: 100.0000 mg | ORAL_CAPSULE | Freq: Three times a day (TID) | ORAL | 11 refills | Status: DC

## 2018-10-16 NOTE — Patient Instructions (Signed)
Medication Instructions:   If you need a refill on your cardiac medications before your next appointment, please call your pharmacy.   Lab work:  If you have labs (blood work) drawn today and your tests are completely normal, you will receive your results only by: . MyChart Message (if you have MyChart) OR . A paper copy in the mail If you have any lab test that is abnormal or we need to change your treatment, we will call you to review the results.  Testing/Procedures: None ordered today.   Follow-Up: At [facility], you and your health needs are our priority.  As part of our continuing mission to provide you with exceptional heart care, we have created designated Provider Care Teams.  These Care Teams include your primary Cardiologist (physician) and Advanced Practice Providers (APPs -  Physician Assistants and Nurse Practitioners) who all work together to provide you with the care you need, when you need it. You will need a follow up appointment in 1 years.  Please call our office 2 months in advance to schedule this appointment.  You may see Dr. Nishan or one of the following Advanced Practice Providers on your designated Care Team:   Lori Gerhardt, NP Laura Ingold, NP . Jill McDaniel, NP  

## 2018-10-22 ENCOUNTER — Other Ambulatory Visit: Payer: Self-pay | Admitting: Internal Medicine

## 2018-10-22 DIAGNOSIS — E1121 Type 2 diabetes mellitus with diabetic nephropathy: Secondary | ICD-10-CM

## 2018-10-22 DIAGNOSIS — Z794 Long term (current) use of insulin: Secondary | ICD-10-CM

## 2018-10-22 MED ORDER — EMPAGLIFLOZIN 25 MG PO TABS: 25.0000 mg | ORAL_TABLET | Freq: Every day | ORAL | 0 refills | Status: DC

## 2018-10-22 NOTE — Telephone Encounter (Signed)
Pt has CPE scheduled for 7.30.2020

## 2018-11-19 ENCOUNTER — Other Ambulatory Visit: Payer: Self-pay | Admitting: Internal Medicine

## 2018-11-19 DIAGNOSIS — Z794 Long term (current) use of insulin: Secondary | ICD-10-CM

## 2018-11-19 DIAGNOSIS — E1121 Type 2 diabetes mellitus with diabetic nephropathy: Secondary | ICD-10-CM

## 2018-11-25 ENCOUNTER — Other Ambulatory Visit (INDEPENDENT_AMBULATORY_CARE_PROVIDER_SITE_OTHER): Payer: Medicare HMO

## 2018-11-25 ENCOUNTER — Ambulatory Visit (INDEPENDENT_AMBULATORY_CARE_PROVIDER_SITE_OTHER): Payer: Medicare HMO | Admitting: Internal Medicine

## 2018-11-25 ENCOUNTER — Encounter: Payer: Self-pay | Admitting: Internal Medicine

## 2018-11-25 ENCOUNTER — Ambulatory Visit (INDEPENDENT_AMBULATORY_CARE_PROVIDER_SITE_OTHER)
Admission: RE | Admit: 2018-11-25 | Discharge: 2018-11-25 | Disposition: A | Discharge status: Home / Self Care | Payer: Medicare HMO | Source: Ambulatory Visit | Attending: Internal Medicine | Admitting: Internal Medicine

## 2018-11-25 ENCOUNTER — Other Ambulatory Visit: Payer: Self-pay

## 2018-11-25 ENCOUNTER — Other Ambulatory Visit: Payer: Self-pay | Admitting: Internal Medicine

## 2018-11-25 VITALS — BP 116/68 | HR 79 | Temp 97.9°F | Ht 71.0 in | Wt 253.0 lb

## 2018-11-25 DIAGNOSIS — E781 Pure hyperglyceridemia: Secondary | ICD-10-CM

## 2018-11-25 DIAGNOSIS — E1121 Type 2 diabetes mellitus with diabetic nephropathy: Secondary | ICD-10-CM

## 2018-11-25 DIAGNOSIS — R200 Anesthesia of skin: Secondary | ICD-10-CM | POA: Diagnosis not present

## 2018-11-25 DIAGNOSIS — I10 Essential (primary) hypertension: Secondary | ICD-10-CM | POA: Diagnosis not present

## 2018-11-25 DIAGNOSIS — R109 Unspecified abdominal pain: Secondary | ICD-10-CM

## 2018-11-25 DIAGNOSIS — Z0000 Encounter for general adult medical examination without abnormal findings: Secondary | ICD-10-CM

## 2018-11-25 DIAGNOSIS — R10A1 Flank pain, right side: Secondary | ICD-10-CM

## 2018-11-25 DIAGNOSIS — E785 Hyperlipidemia, unspecified: Secondary | ICD-10-CM | POA: Diagnosis not present

## 2018-11-25 DIAGNOSIS — R202 Paresthesia of skin: Secondary | ICD-10-CM | POA: Diagnosis not present

## 2018-11-25 DIAGNOSIS — Z23 Encounter for immunization: Secondary | ICD-10-CM

## 2018-11-25 LAB — LIPID PANEL
Cholesterol: 145 mg/dL (ref 0–200)
HDL: 38.1 mg/dL — ABNORMAL LOW (ref >39.00)
LDL Cholesterol: 71 mg/dL (ref 0–99)
NonHDL: 107.37
Total CHOL/HDL Ratio: 4
Triglycerides: 180 mg/dL — ABNORMAL HIGH (ref 0.0–149.0)
VLDL: 36 mg/dL (ref 0.0–40.0)

## 2018-11-25 LAB — BASIC METABOLIC PANEL
BUN: 18 mg/dL (ref 6–23)
CO2: 26 mEq/L (ref 19–32)
Calcium: 9.4 mg/dL (ref 8.4–10.5)
Chloride: 100 mEq/L (ref 96–112)
Creatinine, Ser: 1.05 mg/dL (ref 0.40–1.50)
GFR: 67.83 mL/min (ref >60.00)
Glucose, Bld: 185 mg/dL — ABNORMAL HIGH (ref 70–99)
Potassium: 4.1 mEq/L (ref 3.5–5.1)
Sodium: 137 mEq/L (ref 135–145)

## 2018-11-25 LAB — URINALYSIS, ROUTINE W REFLEX MICROSCOPIC
Bilirubin Urine: NEGATIVE
Hgb urine dipstick: NEGATIVE
Ketones, ur: NEGATIVE
Leukocytes,Ua: NEGATIVE
Nitrite: NEGATIVE
Specific Gravity, Urine: 1.015 (ref 1.000–1.030)
Total Protein, Urine: NEGATIVE
Urine Glucose: >=1000 — AB
Urobilinogen, UA: 0.2 (ref 0.0–1.0)
pH: 5.5 (ref 5.0–8.0)

## 2018-11-25 LAB — CBC WITH DIFFERENTIAL/PLATELET
Basophils Absolute: 0.1 10*3/uL (ref 0.0–0.1)
Basophils Relative: 0.8 % (ref 0.0–3.0)
Eosinophils Absolute: 0.1 10*3/uL (ref 0.0–0.7)
Eosinophils Relative: 0.8 % (ref 0.0–5.0)
HCT: 52.1 % — ABNORMAL HIGH (ref 39.0–52.0)
Hemoglobin: 17.5 g/dL — ABNORMAL HIGH (ref 13.0–17.0)
Lymphocytes Relative: 14.3 % (ref 12.0–46.0)
Lymphs Abs: 1.8 10*3/uL (ref 0.7–4.0)
MCHC: 33.7 g/dL (ref 30.0–36.0)
MCV: 89.3 fl (ref 78.0–100.0)
Monocytes Absolute: 1 10*3/uL (ref 0.1–1.0)
Monocytes Relative: 7.9 % (ref 3.0–12.0)
Neutro Abs: 9.6 10*3/uL — ABNORMAL HIGH (ref 1.4–7.7)
Neutrophils Relative %: 76.2 % (ref 43.0–77.0)
Platelets: 224 10*3/uL (ref 150.0–400.0)
RBC: 5.83 Mil/uL — ABNORMAL HIGH (ref 4.22–5.81)
RDW: 14.5 % (ref 11.5–15.5)
WBC: 12.6 10*3/uL — ABNORMAL HIGH (ref 4.0–10.5)

## 2018-11-25 LAB — TSH: TSH: 1.56 u[IU]/mL (ref 0.35–4.50)

## 2018-11-25 LAB — HEPATIC FUNCTION PANEL
ALT: 24 U/L (ref 0–53)
AST: 16 U/L (ref 0–37)
Albumin: 4.2 g/dL (ref 3.5–5.2)
Alkaline Phosphatase: 21 U/L — ABNORMAL LOW (ref 39–117)
Bilirubin, Direct: 0 mg/dL (ref 0.0–0.3)
Total Bilirubin: 0.6 mg/dL (ref 0.2–1.2)
Total Protein: 7.7 g/dL (ref 6.0–8.3)

## 2018-11-25 LAB — FOLATE: Folate: >23.9 ng/mL (ref >5.9)

## 2018-11-25 LAB — POCT GLYCOSYLATED HEMOGLOBIN (HGB A1C): Hemoglobin A1C: 7.9 % — AB (ref 4.0–5.6)

## 2018-11-25 LAB — VITAMIN B12: Vitamin B-12: 292 pg/mL (ref 211–911)

## 2018-11-25 MED ORDER — RYBELSUS 3 MG PO TABS: 1.0000 | ORAL_TABLET | Freq: Every day | ORAL | 0 refills | Status: DC

## 2018-11-25 MED ORDER — LOSARTAN POTASSIUM-HCTZ 100-25 MG PO TABS: 1.0000 | ORAL_TABLET | Freq: Every day | ORAL | 1 refills | Status: DC

## 2018-11-25 MED ORDER — ZOSTER VAC RECOMB ADJUVANTED 50 MCG/0.5ML IM SUSR: 0.5000 mL | Freq: Once | INTRAMUSCULAR | 1 refills | Status: AC

## 2018-11-25 NOTE — Progress Notes (Signed)
Subjective:  Patient ID: Matthew Villanueva, male    DOB: 1936/05/05  Age: 83 y.o. MRN: [id number]  CC: Hypertension, Diabetes, and Hyperlipidemia   HPI Matthew Villanueva presents for f/up - He complains of a several week history of discomfort in his right mid abdomen and flank.  He describes it as a soreness on palpation.  He denies any episodes of nausea, vomiting, loss of appetite, dysuria, hematuria, or rash.  He complains of weight gain and says he has not been working on his lifestyle modifications.  He also tells me his blood sugars have not been well controlled.  He complains of polys.  Outpatient Medications Prior to Visit  Medication Sig Dispense Refill   aspirin 81 MG tablet Take 81 mg by mouth daily.       blood glucose meter kit and supplies KIT Use to test blood sugar up to twice daily. DX: E11.21 1 each 0   empagliflozin (JARDIANCE) 25 MG TABS tablet Take 25 mg by mouth daily. 30 tablet 0   glucose blood (COOL BLOOD GLUCOSE TEST STRIPS) test strip Use to test blood sugar up to twice daily. DX: E11.21 100 each 12   glucose blood (ONETOUCH VERIO) test strip 1 each by Other route 2 (two) times daily. Use BID 200 each 1   Lancets (ONETOUCH ULTRASOFT) lancets Use to test blood sugar up to twice daily. DX: E11.21 100 each 12   sildenafil (REVATIO) 20 MG tablet TAKE ONE TABLET BY MOUTH ONCE DAILY AS NEEDED 10 tablet 9   colchicine 0.6 MG tablet Take 2 tablets (1.2 mg total) by mouth daily as needed (gout). Take an additional 0.6 mg by mouth two (2) hours later as needed 30 tablet 1   losartan-hydrochlorothiazide (HYZAAR) 100-25 MG tablet TAKE ONE TABLET BY MOUTH DAILY 90 tablet 0   Omega-3 Fatty Acids (FISH OIL) 1000 MG CPDR Take 1,000 mg by mouth daily.     fluticasone (FLONASE) 50 MCG/ACT nasal spray Place 2 sprays into both nostrils daily. (Patient not taking: Reported on 11/25/2018) 16 g 6   gabapentin (NEURONTIN) 100 MG capsule Take 1 capsule (100 mg total) by  mouth 3 (three) times daily. (Patient not taking: Reported on 11/25/2018) 90 capsule 11   meclizine (ANTIVERT) 12.5 MG tablet Take 1 tablet (12.5 mg total) by mouth 3 (three) times daily as needed for dizziness. (Patient not taking: Reported on 11/25/2018) 60 tablet 0   No facility-administered medications prior to visit.     ROS Review of Systems  Constitutional: Positive for unexpected weight change (wt gain). Negative for appetite change, chills, diaphoresis and fatigue.  HENT: Negative.  Negative for trouble swallowing.   Eyes: Negative.   Respiratory: Negative for cough, chest tightness, shortness of breath and wheezing.   Cardiovascular: Negative for chest pain, palpitations and leg swelling.  Gastrointestinal: Positive for abdominal pain. Negative for abdominal distention, blood in stool, constipation, diarrhea, nausea and vomiting.  Endocrine: Positive for polydipsia, polyphagia and polyuria. Negative for cold intolerance and heat intolerance.  Genitourinary: Positive for flank pain. Negative for difficulty urinating, dysuria, hematuria, scrotal swelling, testicular pain and urgency.  Musculoskeletal: Negative for arthralgias, back pain and myalgias.  Skin: Negative for color change and pallor.  Neurological: Positive for numbness. Negative for dizziness, weakness and light-headedness.       He complains of chronic, worsening numbness in his feet.  Hematological: Negative for adenopathy. Does not bruise/bleed easily.  Psychiatric/Behavioral: Negative.     Objective:  BP 116/68 (  BP Location: Left Arm, Patient Position: Sitting, Cuff Size: Large)    Pulse 79    Temp 97.9 F (36.6 C) (Oral)    Ht _0  (1.803 m)    Wt 253 lb (114.8 kg)    SpO2 96%    BMI 35.29 kg/m   BP Readings from Last 3 Encounters:  11/25/18 116/68  10/16/18 138/70  04/23/18 126/72    Wt Readings from Last 3 Encounters:  11/25/18 253 lb (114.8 kg)  10/16/18 257 lb (116.6 kg)  04/23/18 253 lb (114.8 kg)      Physical Exam Vitals signs reviewed.  Constitutional:      General: He is not in acute distress.    Appearance: He is obese. He is not ill-appearing, toxic-appearing or diaphoretic.  HENT:     Nose: Nose normal.     Mouth/Throat:     Pharynx: No oropharyngeal exudate.  Eyes:     Conjunctiva/sclera: Conjunctivae normal.  Neck:     Musculoskeletal: Normal range of motion. No neck rigidity.  Cardiovascular:     Rate and Rhythm: Normal rate and regular rhythm.     Heart sounds: No murmur.  Pulmonary:     Effort: Pulmonary effort is normal.     Breath sounds: No stridor. No wheezing, rhonchi or rales.  Abdominal:     General: Abdomen is protuberant. Bowel sounds are normal. There is no distension.     Palpations: Abdomen is soft. There is no hepatomegaly, splenomegaly or mass.     Tenderness: There is no abdominal tenderness. There is no right CVA tenderness or left CVA tenderness.     Hernia: No hernia is present.  Musculoskeletal: Normal range of motion.     Right lower leg: No edema.     Left lower leg: No edema.  Lymphadenopathy:     Cervical: No cervical adenopathy.  Skin:    General: Skin is warm and dry.     Coloration: Skin is not pale.  Neurological:     General: No focal deficit present.     Mental Status: He is alert and oriented to person, place, and time. Mental status is at baseline.  Psychiatric:        Mood and Affect: Mood normal.        Behavior: Behavior normal.     Lab Results  Component Value Date   WBC 12.6 (H) 11/25/2018   HGB 17.5 (H) 11/25/2018   HCT 52.1 (H) 11/25/2018   PLT 224.0 11/25/2018   GLUCOSE 185 (H) 11/25/2018   CHOL 145 11/25/2018   TRIG 180.0 (H) 11/25/2018   HDL 38.10 (L) 11/25/2018   LDLDIRECT 88.0 09/12/2016   LDLCALC 71 11/25/2018   ALT 24 11/25/2018   AST 16 11/25/2018   NA 137 11/25/2018   K 4.1 11/25/2018   CL 100 11/25/2018   CREATININE 1.05 11/25/2018   BUN 18 11/25/2018   CO2 26 11/25/2018   TSH 1.56  11/25/2018   PSA 2.86 12/03/2017   HGBA1C 7.9 (A) 11/25/2018   MICROALBUR 5.8 (H) 11/25/2018    Ct Angio Chest W/cm &/or Wo Cm  Result Date: 03/09/2016 CLINICAL DATA:  Shortness of breath with exertion EXAM: CT ANGIOGRAPHY CHEST WITH CONTRAST TECHNIQUE: Multidetector CT imaging of the chest was performed using the standard protocol during bolus administration of intravenous contrast. Multiplanar CT image reconstructions and MIPs were obtained to evaluate the vascular anatomy. CONTRAST:  80 mL Isovue 370. COMPARISON:  None. FINDINGS: Cardiovascular: Thoracic aorta demonstrates some  calcific changes without definitive aneurysmal dilatation. Opacification is somewhat limited although no definitive dissection is seen. The pulmonary artery is well visualized and demonstrates a normal branching pattern. No filling defect to suggest pulmonary embolism is identified. Moderate coronary calcifications are seen. The cardiac structures appear within normal limits. Mediastinum/Nodes: Multiple calcified hilar and mediastinal lymph nodes are noted consistent with prior granulomatous disease. No significant lymphadenopathy is noted. The thoracic inlet shows no acute abnormality. No axillary adenopathy is seen. Lungs/Pleura: The lungs are well aerated bilaterally. Scattered subpleural nodular densities are noted within the right lung as well as the left upper lobe. The left upper lobe nodule measures approximately 7 mm. The largest of the right lung nodules measures approximately 7 mm lying with right middle lobe. No pneumothorax or sizable effusion is noted. Upper Abdomen: No acute abnormality. Musculoskeletal: Degenerative changes of the thoracic spine are noted. Review of the MIP images confirms the above findings. IMPRESSION: Bilateral pulmonary nodules which are noncalcified in nature. Given the calcified lymph nodes in the hila and mediastinum these statistically represent noncalcified granulomas although Non-contrast  chest CT at 3-6 months is recommended. If the nodules are stable at time of repeat CT, then future CT at 18-24 months (from today's scan) is considered optional for low-risk patients, but is recommended for high-risk patients. This recommendation follows the consensus statement: Guidelines for Management of Incidental Pulmonary Nodules Detected on CT Images: From the Fleischner Society 2017; Radiology 2017; 284:228-243. No evidence of pulmonary emboli. Changes of prior granulomatous disease. Electronically Signed   By: Inez Catalina M.D.   On: 03/09/2016 09:07   Dg Abd Acute 2+v W 1v Chest  Result Date: 11/25/2018 CLINICAL DATA:  Right flank pain for 5 days. History of hypertension and diabetes. EXAM: DG ABDOMEN ACUTE W/ 1V CHEST COMPARISON:  03/06/2016 FINDINGS: Small round calcification projects in the left mid abdomen, possibly within the lower pole of the left kidney. No other evidence of a renal stone. No evidence of a ureteral stone. Soft tissues otherwise unremarkable. Normal bowel gas pattern.  No free air. Cardiac silhouette is normal size. No mediastinal or hilar masses. No convincing adenopathy. Prominent lung markings in the bases. Lungs otherwise clear. Elevated right hemidiaphragm. IMPRESSION: 1. No acute findings within the abdomen or pelvis. 2. Probable small nonobstructing stone in the lower pole the right kidney. No evidence of a ureteral stone. 3. No active cardiopulmonary disease. Electronically Signed   By: Lajean Manes M.D.   On: 11/25/2018 16:51    Assessment & Plan:   Fernandez was seen today for hypertension, diabetes and hyperlipidemia.  Diagnoses and all orders for this visit:  Need for shingles vaccine -     Zoster Vaccine Adjuvanted [facility]) injection; Inject 0.5 mLs into the muscle once for 1 dose.  Type 2 diabetes mellitus with diabetic nephropathy, without long-term current use of insulin ([place])- His A1c is up to 7.9%.  His blood sugars are not well controlled.  I have  asked him to continue the current dose of the SGLT2 inhibitor and will add on an oral GLP-1 agonist. -     Microalbumin / creatinine urine ratio; Future -     losartan-hydrochlorothiazide (HYZAAR) 100-25 MG tablet; Take 1 tablet by mouth daily. -     POCT glycosylated hemoglobin (Hb A1C) -     Semaglutide (RYBELSUS) 3 MG TABS; Take 1 tablet by mouth daily. -     HM Diabetes Foot Exam  Essential hypertension- His blood pressure is adequately well  controlled.  Electrolytes and renal function are normal. -     CBC with Differential/Platelet; Future -     Basic metabolic panel; Future -     TSH; Future -     losartan-hydrochlorothiazide (HYZAAR) 100-25 MG tablet; Take 1 tablet by mouth daily.  Hyperlipidemia with target LDL less than 100- He is not willing to take a statin for CV risk reduction. -     Lipid panel; Future -     Hepatic function panel; Future -     TSH; Future  Morbid obesity ([place])- He agrees to work on his lifestyle modifications to lose weight.  Numbness and tingling of both feet- Labs are negative for any secondary metabolic causes.  This is probably diabetic neuropathy.  He will undergo a NCS/EMG to confirm and to evaluate for other causes. -     CBC with Differential/Platelet; Future -     Vitamin B12; Future -     Folate; Future -     Nerve conduction test; Future -     Ambulatory referral to Neurology  Pure hyperglyceridemia -     Lipid panel; Future -     Hepatic function panel; Future  Routine health maintenance  Acute right flank pain- Based on a paucity of symptoms, nonacute abdominal exam, normal plain films, and normal labs I am reassured that he does not have an significant abdominal process.  This is probably musculoskeletal abdominal and chest wall pain.  If his symptoms continue or if he develops any worsening symptoms then I will consider doing a CT scan of the abdomen. -     Urinalysis, Routine w reflex microscopic; Future -     DG ABD ACUTE 2+V W 1V  CHEST; Future   I have discontinued Thaddus [employer]'s Fish Oil, meclizine, colchicine, fluticasone, and gabapentin. I have also changed his losartan-hydrochlorothiazide. Additionally, I am having him start on Zoster Vaccine Adjuvanted and Rybelsus. Lastly, I am having him maintain his aspirin, onetouch ultrasoft, glucose blood, blood glucose meter kit and supplies, sildenafil, glucose blood, and empagliflozin.  Meds ordered this encounter  Medications   Zoster Vaccine Adjuvanted [facility]) injection    Sig: Inject 0.5 mLs into the muscle once for 1 dose.    Dispense:  0.5 mL    Refill:  1   losartan-hydrochlorothiazide (HYZAAR) 100-25 MG tablet    Sig: Take 1 tablet by mouth daily.    Dispense:  90 tablet    Refill:  1   Semaglutide (RYBELSUS) 3 MG TABS    Sig: Take 1 tablet by mouth daily.    Dispense:  30 tablet    Refill:  0     Follow-up: Return in about 4 weeks (around 12/23/2018).  Scarlette Calico, MD

## 2018-11-25 NOTE — Patient Instructions (Signed)
Flank Pain, Adult Flank pain is pain that is located on the side of the body between the upper abdomen and the back. This area is called the flank. The pain may occur over a short period of time (acute), or it may be long-term or recurring (chronic). It may be mild or severe. Flank pain can be caused by many things, including:  Muscle soreness or injury.  Kidney stones or kidney disease.  Stress.  A disease of the spine (vertebral disk disease).  A lung infection (pneumonia).  Fluid around the lungs (pulmonary edema).  A skin rash caused by the chickenpox virus (shingles).  Tumors that affect the back of the abdomen.  Gallbladder disease. Follow these instructions at home:   Drink enough fluid to keep your urine clear or pale yellow.  Rest as told by your health care provider.  Take over-the-counter and prescription medicines only as told by your health care provider.  Keep a journal to track what has caused your flank pain and what has made it feel better.  Keep all follow-up visits as told by your health care provider. This is important. Contact a health care provider if:  Your pain is not controlled with medicine.  You have new symptoms.  Your pain gets worse.  You have a fever.  Your symptoms last longer than 2-3 days.  You have trouble urinating or you are urinating very frequently. Get help right away if:  You have trouble breathing or you are short of breath.  Your abdomen hurts or it is swollen or red.  You have nausea or vomiting.  You feel faint or you pass out.  You have blood in your urine. Summary  Flank pain is pain that is located on the side of the body between the upper abdomen and the back.  The pain may occur over a short period of time (acute), or it may be long-term or recurring (chronic). It may be mild or severe.  Flank pain can be caused by many things.  Contact your health care provider if your symptoms get worse or they last  longer than 2-3 days. This information is not intended to replace advice given to you by your health care provider. Make sure you discuss any questions you have with your health care provider. Document Released: 06/22/2005 Document Revised: 04/13/2017 Document Reviewed: 07/14/2016 Elsevier Patient Education  2020 [employer].

## 2018-11-26 ENCOUNTER — Encounter: Payer: Self-pay | Admitting: Internal Medicine

## 2018-11-26 LAB — MICROALBUMIN / CREATININE URINE RATIO
Creatinine,U: 50.9 mg/dL
Microalb Creat Ratio: 11.4 mg/g (ref 0.0–30.0)
Microalb, Ur: 5.8 mg/dL — ABNORMAL HIGH (ref 0.0–1.9)

## 2018-11-27 ENCOUNTER — Encounter: Payer: Self-pay | Admitting: Internal Medicine

## 2018-12-06 ENCOUNTER — Other Ambulatory Visit: Payer: Self-pay

## 2018-12-06 DIAGNOSIS — R200 Anesthesia of skin: Secondary | ICD-10-CM

## 2018-12-12 ENCOUNTER — Encounter: Payer: Medicare HMO | Admitting: Internal Medicine

## 2018-12-12 ENCOUNTER — Ambulatory Visit: Payer: Medicare HMO

## 2018-12-30 DIAGNOSIS — R69 Illness, unspecified: Secondary | ICD-10-CM | POA: Diagnosis not present

## 2019-01-06 ENCOUNTER — Other Ambulatory Visit: Payer: Self-pay | Admitting: Internal Medicine

## 2019-01-06 DIAGNOSIS — Z794 Long term (current) use of insulin: Secondary | ICD-10-CM

## 2019-01-06 DIAGNOSIS — E1121 Type 2 diabetes mellitus with diabetic nephropathy: Secondary | ICD-10-CM

## 2019-01-07 ENCOUNTER — Encounter: Payer: Medicare HMO | Admitting: Neurology

## 2019-01-10 DIAGNOSIS — R69 Illness, unspecified: Secondary | ICD-10-CM | POA: Diagnosis not present

## 2019-02-13 ENCOUNTER — Other Ambulatory Visit: Payer: Self-pay

## 2019-02-13 ENCOUNTER — Encounter: Payer: Self-pay | Admitting: Internal Medicine

## 2019-02-13 ENCOUNTER — Ambulatory Visit: Payer: Medicare HMO | Admitting: Neurology

## 2019-02-13 DIAGNOSIS — R200 Anesthesia of skin: Secondary | ICD-10-CM

## 2019-02-13 DIAGNOSIS — G629 Polyneuropathy, unspecified: Secondary | ICD-10-CM

## 2019-02-13 NOTE — Procedures (Signed)
[facility]  [address], [place]  [place], [place] [address] Tel: [phone] Fax:  [phone] Test Date:  02/13/2019  Patient: Matthew Villanueva DOB: Jan 11, 1936 Physician: Narda Amber, DO  Sex: Male Height: 5\' 11"  Ref Phys: Scarlette Calico, MD  ID#: [id number] Temp: 32.0C Technician:    Patient Complaints: This is a 83 year old man with diabetes mellitus referred for evaluation of bilateral feet tingling and numbness.  NCV & EMG Findings: Extensive electrodiagnostic testing of the right lower extremity and additional studies of the left shows: 1. Bilateral sural and superficial peroneal sensory responses are absent. 2. Bilateral peroneal (EDB) and tibial motor responses show reduced amplitudes.  Bilateral peroneal motor responses at the tibialis anterior are within normal limits. 3. Bilateral tibial H reflex studies show prolonged latencies. 4. Chronic motor axonal loss changes is seen in bilateral flexor digitorum longus and right anterior tibialis muscles.  There is no evidence of accompanied active denervation.  Impression: 1. The electrophysiologic findings are most consistent with a chronic and symmetric sensorimotor axonal polyneuropathy affecting the lower extremities. 2. There is no evidence of a superimposed lumbosacral radiculopathy affecting either extremity.   ___________________________ Narda Amber, DO    Nerve Conduction Studies Anti Sensory Summary Table   Site NR Peak (ms) Norm Peak (ms) P-T Amp (V) Norm P-T Amp  Left Sup Peroneal Anti Sensory (Ant Lat Mall)  32C  12 cm NR  <4.6  >3  Right Sup Peroneal Anti Sensory (Ant Lat Mall)  32C  12 cm NR  <4.6  >3  Left Sural Anti Sensory (Lat Mall)  32C  Calf NR  <4.6  >3  Right Sural Anti Sensory (Lat Mall)  32C  Calf NR  <4.6  >3   Motor Summary Table   Site NR Onset (ms) Norm Onset (ms) O-P Amp (mV) Norm O-P Amp Site1 Site2 Delta-0 (ms) Dist (cm) Vel (m/s) Norm Vel (m/s)  Left Peroneal  Motor (Ext Dig Brev)  32C  Ankle    4.8 <6.0 1.5 >2.5 B Fib Ankle 8.6 37.0 43 >40  B Fib    13.4  1.4  Poplt B Fib 1.9 9.0 47 >40  Poplt    15.3  1.4         Right Peroneal Motor (Ext Dig Brev)  32C  Ankle    4.2 <6.0 2.1 >2.5 B Fib Ankle 9.1 40.0 44 >40  B Fib    13.3  1.8  Poplt B Fib 1.6 9.0 56 >40  Poplt    14.9  1.6         Left Peroneal TA Motor (Tib Ant)  32C  Fib Head    3.3 <4.5 5.2 >3 Poplit Fib Head 1.4 8.0 57 >40  Poplit    4.7  5.0         Right Peroneal TA Motor (Tib Ant)  32C  Fib Head    3.0 <4.5 5.4 >3 Poplit Fib Head 1.6 9.0 56 >40  Poplit    4.6  5.3         Left Tibial Motor (Abd Hall Brev)  32C  Ankle    5.0 <6.0 1.0 >4 Knee Ankle 9.5 43.0 45 >40  Knee    14.5  0.6         Right Tibial Motor (Abd Hall Brev)  32C  Ankle    5.2 <6.0 1.4 >4 Knee Ankle 10.7 45.0 42 >40  Knee    15.9  0.8  H Reflex Studies   NR H-Lat (ms) Lat Norm (ms) L-R H-Lat (ms)  Left Tibial (Gastroc)  32C     37.82 <35 2.18  Right Tibial (Gastroc)  32C     40.00 <35 2.18   EMG   Side Muscle Ins Act Fibs Psw Fasc Number Recrt Dur Dur. Amp Amp. Poly Poly. Comment  Left AntTibialis Nml Nml Nml Nml Nml Nml Nml Nml Nml Nml Nml Nml N/A  Left Gastroc Nml Nml Nml Nml Nml Nml Nml Nml Nml Nml Nml Nml N/A  Left GluteusMed Nml Nml Nml Nml Nml Nml Nml Nml Nml Nml Nml Nml N/A  Left RectFemoris Nml Nml Nml Nml Nml Nml Nml Nml Nml Nml Nml Nml N/A  Left Flex Dig Long Nml Nml Nml Nml 1- Rapid Some 1+ Some 1+ Nml Nml N/A  Right AntTibialis Nml Nml Nml Nml 1- Rapid Few 1+ Few 1+ Nml Nml N/A  Right GluteusMed Nml Nml Nml Nml Nml Nml Nml Nml Nml Nml Nml Nml N/A  Right Gastroc Nml Nml Nml Nml Nml Nml Nml Nml Nml Nml Nml Nml N/A  Right RectFemoris Nml Nml Nml Nml Nml Nml Nml Nml Nml Nml Nml Nml N/A  Right Flex Dig Long Nml Nml Nml Nml 1- Rapid Some 1+ Some 1+ Nml Nml N/A      Waveforms:

## 2019-02-15 ENCOUNTER — Ambulatory Visit: Payer: Medicare HMO

## 2019-02-17 ENCOUNTER — Ambulatory Visit (INDEPENDENT_AMBULATORY_CARE_PROVIDER_SITE_OTHER): Payer: Medicare HMO

## 2019-02-17 ENCOUNTER — Other Ambulatory Visit: Payer: Self-pay

## 2019-02-17 DIAGNOSIS — Z23 Encounter for immunization: Secondary | ICD-10-CM | POA: Diagnosis not present

## 2019-02-18 ENCOUNTER — Other Ambulatory Visit: Payer: Self-pay

## 2019-02-18 ENCOUNTER — Encounter: Payer: Self-pay | Admitting: Internal Medicine

## 2019-02-18 ENCOUNTER — Ambulatory Visit (INDEPENDENT_AMBULATORY_CARE_PROVIDER_SITE_OTHER): Payer: Medicare HMO | Admitting: Internal Medicine

## 2019-02-18 VITALS — BP 130/80 | HR 69 | Temp 97.9°F | Resp 16 | Ht 71.0 in | Wt 254.0 lb

## 2019-02-18 DIAGNOSIS — E1140 Type 2 diabetes mellitus with diabetic neuropathy, unspecified: Secondary | ICD-10-CM | POA: Diagnosis not present

## 2019-02-18 DIAGNOSIS — B079 Viral wart, unspecified: Secondary | ICD-10-CM

## 2019-02-18 DIAGNOSIS — B078 Other viral warts: Secondary | ICD-10-CM

## 2019-02-18 MED ORDER — METANX 3-90.314-2-35 MG PO CAPS: 1.0000 | ORAL_CAPSULE | Freq: Two times a day (BID) | ORAL | 1 refills | Status: DC

## 2019-02-18 NOTE — Progress Notes (Signed)
Subjective:  Patient ID: Matthew Villanueva, male    DOB: 1935-05-27  Age: 83 y.o. MRN: [id number]  CC: Diabetes   HPI Matthew Villanueva presents for f/up - He continues to complain of numbness and tingling in his feet.  He recently underwent a nerve conduction study and the findings were consistent with diabetic neuropathy.  He also complains of a lesion on the dorsum of his left hand that is been growing over the last year.      Outpatient Medications Prior to Visit  Medication Sig Dispense Refill  . aspirin 81 MG tablet Take 81 mg by mouth daily.      . blood glucose meter kit and supplies KIT Use to test blood sugar up to twice daily. 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 glucose blood (COOL BLOOD GLUCOSE TEST STRIPS) test strip Use to test blood sugar up to twice daily. DX: E11.21 100 each 12  . glucose blood (ONETOUCH VERIO) test strip 1 each by Other route 2 (two) times daily. Use BID 200 each 1  . JARDIANCE 25 MG TABS tablet TAKE ONE TABLET BY MOUTH DAILY 90 tablet 1  . Lancets (ONETOUCH ULTRASOFT) lancets Use to test blood sugar up to twice daily. DX: E11.21 100 each 12  . losartan-hydrochlorothiazide (HYZAAR) 100-25 MG tablet Take 1 tablet by mouth daily. 90 tablet 1  . Semaglutide (RYBELSUS) 3 MG TABS Take 1 tablet by mouth daily. 30 tablet 0  . sildenafil (REVATIO) 20 MG tablet TAKE ONE TABLET BY MOUTH ONCE DAILY AS NEEDED 10 tablet 9   No facility-administered medications prior to visit.     ROS Review of Systems  Constitutional: Negative for diaphoresis and fatigue.  HENT: Negative.   Eyes: Negative.   Respiratory: Negative for cough, chest tightness, shortness of breath and wheezing.   Gastrointestinal: Negative for abdominal pain, diarrhea, nausea and vomiting.  Endocrine: Negative.  Negative for polydipsia and polyphagia.  Genitourinary: Negative.  Negative for difficulty urinating.  Musculoskeletal: Negative.  Negative for arthralgias and myalgias.  Neurological:  Positive for numbness. Negative for dizziness and weakness.  Hematological: Negative for adenopathy. Does not bruise/bleed easily.  Psychiatric/Behavioral: Negative.     Objective:  BP 130/80 (BP Location: Left Arm, Patient Position: Sitting, Cuff Size: Large)   Pulse 69   Temp 97.9 F (36.6 C) (Oral)   Resp 16   Ht 5' 11" (1.803 m)   Wt 254 lb (115.2 kg)   SpO2 94%   BMI 35.43 kg/m   BP Readings from Last 3 Encounters:  02/18/19 130/80  11/25/18 116/68  10/16/18 138/70    Wt Readings from Last 3 Encounters:  02/18/19 254 lb (115.2 kg)  11/25/18 253 lb (114.8 kg)  10/16/18 257 lb (116.6 kg)    Physical Exam Vitals signs reviewed.  Constitutional:      Appearance: He is obese.  HENT:     Nose: Nose normal.     Mouth/Throat:     Pharynx: Oropharynx is clear.  Eyes:     General: No scleral icterus.    Conjunctiva/sclera: Conjunctivae normal.  Neck:     Musculoskeletal: Neck supple. No neck rigidity.  Cardiovascular:     Rate and Rhythm: Normal rate and regular rhythm.     Heart sounds: No murmur.  Pulmonary:     Effort: Pulmonary effort is normal.     Breath sounds: No stridor. No wheezing, rhonchi or rales.  Abdominal:     General: Abdomen is protuberant. Bowel  sounds are normal. There is no distension.     Palpations: Abdomen is soft. There is no hepatomegaly or splenomegaly.     Tenderness: There is no abdominal tenderness.  Musculoskeletal: Normal range of motion.     Right lower leg: No edema.     Left lower leg: No edema.  Skin:    General: Skin is warm and dry.     Comments: Verrucous lesion on the dorsum of the left hand.  See photo.  Neurological:     General: No focal deficit present.     Mental Status: He is alert.  Psychiatric:        Mood and Affect: Mood normal.        Behavior: Behavior normal.     Lab Results  Component Value Date   WBC 12.6 (H) 11/25/2018   HGB 17.5 (H) 11/25/2018   HCT 52.1 (H) 11/25/2018   PLT 224.0 11/25/2018    GLUCOSE 185 (H) 11/25/2018   CHOL 145 11/25/2018   TRIG 180.0 (H) 11/25/2018   HDL 38.10 (L) 11/25/2018   LDLDIRECT 88.0 09/12/2016   LDLCALC 71 11/25/2018   ALT 24 11/25/2018   AST 16 11/25/2018   NA 137 11/25/2018   K 4.1 11/25/2018   CL 100 11/25/2018   CREATININE 1.05 11/25/2018   BUN 18 11/25/2018   CO2 26 11/25/2018   TSH 1.56 11/25/2018   PSA 2.86 12/03/2017   HGBA1C 7.9 (A) 11/25/2018   MICROALBUR 5.8 (H) 11/25/2018    Dg Abd Acute 2+v W 1v Chest  Result Date: 11/25/2018 CLINICAL DATA:  Right flank pain for 5 days. History of hypertension and diabetes. EXAM: DG ABDOMEN ACUTE W/ 1V CHEST COMPARISON:  03/06/2016 FINDINGS: Small round calcification projects in the left mid abdomen, possibly within the lower pole of the left kidney. No other evidence of a renal stone. No evidence of a ureteral stone. Soft tissues otherwise unremarkable. Normal bowel gas pattern.  No free air. Cardiac silhouette is normal size. No mediastinal or hilar masses. No convincing adenopathy. Prominent lung markings in the bases. Lungs otherwise clear. Elevated right hemidiaphragm. IMPRESSION: 1. No acute findings within the abdomen or pelvis. 2. Probable small nonobstructing stone in the lower pole the right kidney. No evidence of a ureteral stone. 3. No active cardiopulmonary disease. Electronically Signed   By: Lajean Manes M.D.   On: 11/25/2018 16:51   Liquid nitrogen was applied for 10-12 seconds to the skin lesion, I applied 3 different cycles of freeze-and-thaw.  The expected blistering and scabbing reaction explained. Patient reminded to expect hypopigmented scars from the procedure. Return if lesions fail to fully resolve.   Assessment & Plan:   Matthew Villanueva was seen today for diabetes.  Diagnoses and all orders for this visit:  Chronic painful diabetic neuropathy ([place])- I have asked him to start taking the combination of methyl folate, alginate, B12, and B6 to treat this. -      L-Methylfolate-Algae-B12-B6 (METANX) 3-90.314-2-35 MG CAPS; Take 1 capsule by mouth 2 (two) times daily.  Viral warts, unspecified type- This was treated with cryotherapy.  He will return in 2 to 3 weeks.  If the lesion is still there then I will consider excisional biopsy and/or another round of cryotherapy.   I am having Matthew Villanueva start on Metanx. I am also having him maintain his aspirin, onetouch ultrasoft, glucose blood, blood glucose meter kit and supplies, sildenafil, glucose blood, losartan-hydrochlorothiazide, Rybelsus, and Jardiance.  Meds ordered this encounter  Medications  .  L-Methylfolate-Algae-B12-B6 (METANX) 3-90.314-2-35 MG CAPS    Sig: Take 1 capsule by mouth 2 (two) times daily.    Dispense:  180 capsule    Refill:  1     Follow-up: Return in about 3 weeks (around 03/11/2019).  Scarlette Calico, MD

## 2019-04-06 DIAGNOSIS — R69 Illness, unspecified: Secondary | ICD-10-CM | POA: Diagnosis not present

## 2019-04-15 ENCOUNTER — Ambulatory Visit (INDEPENDENT_AMBULATORY_CARE_PROVIDER_SITE_OTHER): Payer: Medicare HMO | Admitting: Internal Medicine

## 2019-04-15 ENCOUNTER — Other Ambulatory Visit: Payer: Medicare HMO

## 2019-04-15 ENCOUNTER — Other Ambulatory Visit: Payer: Self-pay

## 2019-04-15 ENCOUNTER — Encounter: Payer: Self-pay | Admitting: Internal Medicine

## 2019-04-15 ENCOUNTER — Other Ambulatory Visit: Payer: Self-pay | Admitting: Internal Medicine

## 2019-04-15 VITALS — BP 136/76 | HR 65 | Temp 98.2°F | Resp 16 | Ht 71.0 in | Wt 250.0 lb

## 2019-04-15 DIAGNOSIS — L989 Disorder of the skin and subcutaneous tissue, unspecified: Secondary | ICD-10-CM

## 2019-04-15 DIAGNOSIS — M10071 Idiopathic gout, right ankle and foot: Secondary | ICD-10-CM | POA: Diagnosis not present

## 2019-04-15 DIAGNOSIS — D0462 Carcinoma in situ of skin of left upper limb, including shoulder: Secondary | ICD-10-CM | POA: Insufficient documentation

## 2019-04-15 DIAGNOSIS — C44629 Squamous cell carcinoma of skin of left upper limb, including shoulder: Secondary | ICD-10-CM | POA: Diagnosis not present

## 2019-04-15 MED ORDER — METHYLPREDNISOLONE ACETATE 80 MG/ML IJ SUSP
120.0000 mg | Freq: Once | INTRAMUSCULAR | Status: AC
  Administered 2019-04-15: 120 mg via INTRAMUSCULAR

## 2019-04-15 NOTE — Patient Instructions (Signed)
Wound Care, Adult Taking care of your wound properly can help to prevent pain, infection, and scarring. It can also help your wound to heal more quickly. How to care for your wound Wound care      Follow instructions from your health care provider about how to take care of your wound. Make sure you: ? Wash your hands with soap and water before you change the bandage (dressing). If soap and water are not available, use hand sanitizer. ? Change your dressing as told by your health care provider. ? Leave stitches (sutures), skin glue, or adhesive strips in place. These skin closures may need to stay in place for 2 weeks or longer. If adhesive strip edges start to loosen and curl up, you may trim the loose edges. Do not remove adhesive strips completely unless your health care provider tells you to do that.  Check your wound area every day for signs of infection. Check for: ? Redness, swelling, or pain. ? Fluid or blood. ? Warmth. ? Pus or a bad smell.  Ask your health care provider if you should clean the wound with mild soap and water. Doing this may include: ? Using a clean towel to pat the wound dry after cleaning it. Do not rub or scrub the wound. ? Applying a cream or ointment. Do this only as told by your health care provider. ? Covering the incision with a clean dressing.  Ask your health care provider when you can leave the wound uncovered.  Keep the dressing dry until your health care provider says it can be removed. Do not take baths, swim, use a hot tub, or do anything that would put the wound underwater until your health care provider approves. Ask your health care provider if you can take showers. You may only be allowed to take sponge baths. Medicines   If you were prescribed an antibiotic medicine, cream, or ointment, take or use the antibiotic as told by your health care provider. Do not stop taking or using the antibiotic even if your condition improves.  Take  over-the-counter and prescription medicines only as told by your health care provider. If you were prescribed pain medicine, take it 30 or more minutes before you do any wound care or as told by your health care provider. General instructions  Return to your normal activities as told by your health care provider. Ask your health care provider what activities are safe.  Do not scratch or pick at the wound.  Do not use any products that contain nicotine or tobacco, such as cigarettes and e-cigarettes. These may delay wound healing. If you need help quitting, ask your health care provider.  Keep all follow-up visits as told by your health care provider. This is important.  Eat a diet that includes protein, vitamin A, vitamin C, and other nutrient-rich foods to help the wound heal. ? Foods rich in protein include meat, dairy, beans, nuts, and other sources. ? Foods rich in vitamin A include carrots and dark green, leafy vegetables. ? Foods rich in vitamin C include citrus, tomatoes, and other fruits and vegetables. ? Nutrient-rich foods have protein, carbohydrates, fat, vitamins, or minerals. Eat a variety of healthy foods including vegetables, fruits, and whole grains. Contact a health care provider if:  You received a tetanus shot and you have swelling, severe pain, redness, or bleeding at the injection site.  Your pain is not controlled with medicine.  You have redness, swelling, or pain around the wound.    You have fluid or blood coming from the wound.  Your wound feels warm to the touch.  You have pus or a bad smell coming from the wound.  You have a fever or chills.  You are nauseous or you vomit.  You are dizzy. Get help right away if:  You have a red streak going away from your wound.  The edges of the wound open up and separate.  Your wound is bleeding, and the bleeding does not stop with gentle pressure.  You have a rash.  You faint.  You have trouble breathing.  Summary  Always wash your hands with soap and water before changing your bandage (dressing).  To help with healing, eat foods that are rich in protein, vitamin A, vitamin C, and other nutrients.  Check your wound every day for signs of infection. Contact your health care provider if you suspect that your wound is infected. This information is not intended to replace advice given to you by your health care provider. Make sure you discuss any questions you have with your health care provider. Document Released: 02/08/2008 Document Revised: 08/19/2018 Document Reviewed: 11/16/2015 Elsevier Patient Education  2020 [employer]  

## 2019-04-15 NOTE — Progress Notes (Signed)
Subjective:  Patient ID: Matthew Villanueva, male    DOB: 01-25-1936  Age: 83 y.o. MRN: [id number]  CC: Foot Pain   This visit occurred during the SARS-CoV-2 public health emergency.  Safety protocols were in place, including screening questions prior to the visit, additional usage of staff PPE, and extensive cleaning of exam room while observing appropriate contact time as indicated for disinfecting solutions.    HPI Matthew Villanueva presents for f/up -  1.  He complains of a 5-day history of nontraumatic pain, redness, swelling in his right foot at the base of the great toe.  He has had 12 episodes of gout in his lifetime.  He is not getting much symptom relief with colchicine.  2.  He complains of recurrent lesion on the dorsum of his left hand.  I applied cryotherapy to it 6 weeks ago and he said he got a little smaller but now it is coming back.  It is sometimes uncomfortable but it never bleeds or drains any pus.  Outpatient Medications Prior to Visit  Medication Sig Dispense Refill   aspirin 81 MG tablet Take 81 mg by mouth daily.       blood glucose meter kit and supplies KIT Use to test blood sugar up to twice daily. DX: E11.21 1 each 0   glucose blood (COOL BLOOD GLUCOSE TEST STRIPS) test strip Use to test blood sugar up to twice daily. DX: E11.21 100 each 12   glucose blood (ONETOUCH VERIO) test strip 1 each by Other route 2 (two) times daily. Use BID 200 each 1   JARDIANCE 25 MG TABS tablet TAKE ONE TABLET BY MOUTH DAILY 90 tablet 1   L-Methylfolate-Algae-B12-B6 (METANX) 3-90.314-2-35 MG CAPS Take 1 capsule by mouth 2 (two) times daily. 180 capsule 1   Lancets (ONETOUCH ULTRASOFT) lancets Use to test blood sugar up to twice daily. DX: E11.21 100 each 12   losartan-hydrochlorothiazide (HYZAAR) 100-25 MG tablet Take 1 tablet by mouth daily. 90 tablet 1   Semaglutide (RYBELSUS) 3 MG TABS Take 1 tablet by mouth daily. 30 tablet 0   sildenafil (REVATIO) 20  MG tablet TAKE ONE TABLET BY MOUTH ONCE DAILY AS NEEDED 10 tablet 9   No facility-administered medications prior to visit.     ROS Review of Systems  Constitutional: Negative.  Negative for chills, fatigue and fever.  HENT: Negative.   Eyes: Negative.   Respiratory: Negative for cough, chest tightness, shortness of breath and wheezing.   Gastrointestinal: Negative for abdominal pain, diarrhea, nausea and vomiting.  Endocrine: Negative.   Genitourinary: Negative.  Negative for difficulty urinating.  Musculoskeletal: Positive for arthralgias.  Skin: Negative.   Neurological: Negative.  Negative for dizziness, weakness and light-headedness.  Hematological: Negative for adenopathy. Does not bruise/bleed easily.  Psychiatric/Behavioral: Negative.     Objective:  BP 136/76 (BP Location: Left Arm, Patient Position: Sitting, Cuff Size: Large)    Pulse 65    Temp 98.2 F (36.8 C) (Oral)    Resp 16    Ht '5\' 11"'$  (1.803 m)    Wt 250 lb (113.4 kg)    SpO2 95%    BMI 34.87 kg/m   BP Readings from Last 3 Encounters:  04/15/19 136/76  02/18/19 130/80  11/25/18 116/68    Wt Readings from Last 3 Encounters:  04/15/19 250 lb (113.4 kg)  02/18/19 254 lb (115.2 kg)  11/25/18 253 lb (114.8 kg)    Physical Exam Vitals signs  reviewed.  Constitutional:      Appearance: Normal appearance.  HENT:     Nose: Nose normal.     Mouth/Throat:     Mouth: Mucous membranes are moist.  Eyes:     General: No scleral icterus.    Conjunctiva/sclera: Conjunctivae normal.  Neck:     Musculoskeletal: Neck supple.  Cardiovascular:     Rate and Rhythm: Normal rate and regular rhythm.  Pulmonary:     Effort: Pulmonary effort is normal.     Breath sounds: No stridor. No wheezing, rhonchi or rales.  Abdominal:     General: Abdomen is protuberant. Bowel sounds are normal. There is no distension.     Palpations: Abdomen is soft. There is no hepatomegaly or splenomegaly.  Musculoskeletal: Normal range of  motion.     Right lower leg: No edema.     Left lower leg: No edema.       Feet:     Comments: Dorsum of left hand shows a round, raised, dome-shaped lesion.  The base is erythematous and the very center has a whitish fibrous element.  See photo  Lymphadenopathy:     Cervical: No cervical adenopathy.  Skin:    General: Skin is warm and dry.  Neurological:     General: No focal deficit present.     Mental Status: He is alert.  Psychiatric:        Mood and Affect: Mood normal.     Lab Results  Component Value Date   WBC 12.6 (H) 11/25/2018   HGB 17.5 (H) 11/25/2018   HCT 52.1 (H) 11/25/2018   PLT 224.0 11/25/2018   GLUCOSE 185 (H) 11/25/2018   CHOL 145 11/25/2018   TRIG 180.0 (H) 11/25/2018   HDL 38.10 (L) 11/25/2018   LDLDIRECT 88.0 09/12/2016   LDLCALC 71 11/25/2018   ALT 24 11/25/2018   AST 16 11/25/2018   NA 137 11/25/2018   K 4.1 11/25/2018   CL 100 11/25/2018   CREATININE 1.05 11/25/2018   BUN 18 11/25/2018   CO2 26 11/25/2018   TSH 1.56 11/25/2018   PSA 2.86 12/03/2017   HGBA1C 7.9 (A) 11/25/2018   MICROALBUR 5.8 (H) 11/25/2018    Dg Abd Acute 2+v W 1v Chest  Result Date: 11/25/2018 CLINICAL DATA:  Right flank pain for 5 days. History of hypertension and diabetes. EXAM: DG ABDOMEN ACUTE W/ 1V CHEST COMPARISON:  03/06/2016 FINDINGS: Small round calcification projects in the left mid abdomen, possibly within the lower pole of the left kidney. No other evidence of a renal stone. No evidence of a ureteral stone. Soft tissues otherwise unremarkable. Normal bowel gas pattern.  No free air. Cardiac silhouette is normal size. No mediastinal or hilar masses. No convincing adenopathy. Prominent lung markings in the bases. Lungs otherwise clear. Elevated right hemidiaphragm. IMPRESSION: 1. No acute findings within the abdomen or pelvis. 2. Probable small nonobstructing stone in the lower pole the right kidney. No evidence of a ureteral stone. 3. No active cardiopulmonary  disease. Electronically Signed   By: Lajean Manes M.D.   On: 11/25/2018 16:51   After informed verbal consent was obtained, using Betadine for cleansing and 1% Lidocaine with epinephrine for anesthetic 1.5 cc's used, with sterile technique a 6 mm punch biopsy was used to obtain a biopsy specimen of the lesion and to remove it. Hemostasis was obtained by pressure and wound was sutured with 1 interrupted suture 4-0 Prolene.Marland Kitchen Antibiotic dressing is applied, and wound care instructions provided. The  specimen is labeled and sent to pathology for evaluation. The procedure was well tolerated without complications.   Assessment & Plan:   Elisa was seen today for foot pain.  Diagnoses and all orders for this visit:  Skin lesion of hand- Biopsy specimen obtained and sent to identify the cause from this lesion -need to rule out basal cell carcinoma and squamous cell carcinoma.  If it is a verrucous lesion that is been removed in its entirety. -     Cancel: Surgical pathology -     Surgical pathology; Future  Acute idiopathic gout of right foot- I treated this with an injection of methylprednisolone.  He has had about a dozen gouty attacks and when this episode resolves I will recommend that he start taking a xanthine oxidase inhibitor. -     methylPREDNISolone acetate (DEPO-MEDROL) injection 120 mg   I am having Matthew Villanueva maintain his aspirin, onetouch ultrasoft, glucose blood, blood glucose meter kit and supplies, sildenafil, glucose blood, losartan-hydrochlorothiazide, Rybelsus, Jardiance, and Metanx. We administered methylPREDNISolone acetate.  Meds ordered this encounter  Medications   methylPREDNISolone acetate (DEPO-MEDROL) injection 120 mg     Follow-up: Return today (on 04/15/2019).  Scarlette Calico, MD

## 2019-04-20 ENCOUNTER — Encounter: Payer: Self-pay | Admitting: Internal Medicine

## 2019-04-21 DIAGNOSIS — R69 Illness, unspecified: Secondary | ICD-10-CM | POA: Diagnosis not present

## 2019-04-22 ENCOUNTER — Encounter: Payer: Self-pay | Admitting: Internal Medicine

## 2019-04-22 ENCOUNTER — Other Ambulatory Visit: Payer: Self-pay

## 2019-04-22 ENCOUNTER — Ambulatory Visit (INDEPENDENT_AMBULATORY_CARE_PROVIDER_SITE_OTHER): Payer: Medicare HMO | Admitting: Internal Medicine

## 2019-04-22 VITALS — BP 140/80 | HR 71 | Temp 98.1°F | Ht 71.0 in | Wt 249.0 lb

## 2019-04-22 DIAGNOSIS — D0462 Carcinoma in situ of skin of left upper limb, including shoulder: Secondary | ICD-10-CM | POA: Diagnosis not present

## 2019-04-22 DIAGNOSIS — C44629 Squamous cell carcinoma of skin of left upper limb, including shoulder: Secondary | ICD-10-CM

## 2019-04-22 NOTE — Progress Notes (Signed)
Subjective:  Patient ID: Matthew Villanueva, male    DOB: [birth year]  Age: 83 y.o. MRN: [id number]  CC: Wound Check  This visit occurred during the SARS-CoV-2 public health emergency.  Safety protocols were in place, including screening questions prior to the visit, additional usage of staff PPE, and extensive cleaning of exam room while observing appropriate contact time as indicated for disinfecting solutions.   HPI Matthew Villanueva presents for f/up - He underwent a biopsy of a lesion on the dorsum of his left hand a week ago.  He comes in today to have the suture removed.  He tells me the wound has healed nicely with no symptoms or complications.  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  Outpatient Medications Prior to Visit  Medication Sig Dispense Refill  . aspirin 81 MG tablet Take 81 mg by mouth daily.      . blood glucose meter kit and supplies KIT Use to test blood sugar up to twice daily. 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 glucose blood (COOL BLOOD GLUCOSE TEST STRIPS) test strip Use to test blood sugar up to twice daily. DX: E11.21 100 each 12  . glucose blood (ONETOUCH VERIO) test strip 1 each by Other route 2 (two) times daily. Use BID 200 each 1  . JARDIANCE 25 MG TABS tablet TAKE ONE TABLET BY MOUTH DAILY 90 tablet 1  . L-Methylfolate-Algae-B12-B6 (METANX) 3-90.314-2-35 MG CAPS Take 1 capsule by mouth 2 (two) times daily. 180 capsule 1  . Lancets (ONETOUCH ULTRASOFT) lancets Use to test blood sugar up to twice daily. DX: E11.21 100 each 12  . losartan-hydrochlorothiazide (HYZAAR) 100-25 MG tablet Take 1 tablet by mouth daily. 90 tablet 1  . Semaglutide (RYBELSUS) 3 MG TABS Take 1 tablet by mouth daily. 30 tablet 0  . sildenafil (REVATIO) 20 MG tablet TAKE ONE TABLET BY MOUTH ONCE DAILY AS NEEDED 10 tablet 9   No facility-administered medications prior to visit.     ROS Review of Systems  All other systems reviewed and are negative.   Objective:  BP 140/80  (BP Location: Left Arm, Patient Position: Sitting, Cuff Size: Normal)   Pulse 71   Temp 98.1 F (36.7 C) (Oral)   Ht '5\' 11"'$  (1.803 m)   Wt 249 lb (112.9 kg)   SpO2 94%   BMI 34.73 kg/m   BP Readings from Last 3 Encounters:  04/22/19 140/80  04/15/19 136/76  02/18/19 130/80    Wt Readings from Last 3 Encounters:  04/22/19 249 lb (112.9 kg)  04/15/19 250 lb (113.4 kg)  02/18/19 254 lb (115.2 kg)    Physical Exam Musculoskeletal:       Hands:     Lab Results  Component Value Date   WBC 12.6 (H) 11/25/2018   HGB 17.5 (H) 11/25/2018   HCT 52.1 (H) 11/25/2018   PLT 224.0 11/25/2018   GLUCOSE 185 (H) 11/25/2018   CHOL 145 11/25/2018   TRIG 180.0 (H) 11/25/2018   HDL 38.10 (L) 11/25/2018   LDLDIRECT 88.0 09/12/2016   LDLCALC 71 11/25/2018   ALT 24 11/25/2018   AST 16 11/25/2018   NA 137 11/25/2018   K 4.1 11/25/2018   CL 100 11/25/2018   CREATININE 1.05 11/25/2018   BUN 18 11/25/2018   CO2 26 11/25/2018   TSH 1.56 11/25/2018   PSA 2.86 12/03/2017   HGBA1C 7.9 (A) 11/25/2018   MICROALBUR 5.8 (H) 11/25/2018    Dg Abd Acute  2+v W 1v Chest  Result Date: 11/25/2018 CLINICAL DATA:  Right flank pain for 5 days. History of hypertension and diabetes. EXAM: DG ABDOMEN ACUTE W/ 1V CHEST COMPARISON:  03/06/2016 FINDINGS: Small round calcification projects in the left mid abdomen, possibly within the lower pole of the left kidney. No other evidence of a renal stone. No evidence of a ureteral stone. Soft tissues otherwise unremarkable. Normal bowel gas pattern.  No free air. Cardiac silhouette is normal size. No mediastinal or hilar masses. No convincing adenopathy. Prominent lung markings in the bases. Lungs otherwise clear. Elevated right hemidiaphragm. IMPRESSION: 1. No acute findings within the abdomen or pelvis. 2. Probable small nonobstructing stone in the lower pole the right kidney. No evidence of a ureteral stone. 3. No active cardiopulmonary disease. Electronically Signed    By: Lajean Manes M.D.   On: 11/25/2018 16:51    Assessment & Plan:   Keath was seen today for wound check.  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   I am having Evens C. Pichon maintain his aspirin, onetouch ultrasoft, glucose blood, blood glucose meter kit and supplies, sildenafil, glucose blood, losartan-hydrochlorothiazide, Rybelsus, Jardiance, and Metanx.  No orders of the defined types were placed in this encounter.    Follow-up: Return if symptoms worsen or fail to improve.  Scarlette Calico, MD

## 2019-04-22 NOTE — Patient Instructions (Signed)
Squamous Cell Carcinoma Squamous cell carcinoma is a common form of skin cancer. It begins in the squamous cells in the outer layer of the skin (epidermis). It occurs most often in parts of the body that are frequently exposed to the sun, such as the face, ears, lips, neck, arms, legs, and hands. However, this condition can occur anywhere on the body, including the inside of the mouth, the genital area, and the anus. If squamous cell carcinoma is treated early, it rarely spreads to other areas of the body (metastasizes). If it is not treated, it can affect nearby tissues. In rare cases, it can spread to other areas of the body. What are the causes? This condition is usually caused by exposure to ultraviolet (UV) light. UV light may come from the sun or from tanning beds.  Other causes include exposure to:  Arsenic.  Radiation.  Toxic tars and oils. In very rare cases, a genetic condition that makes a person sensitive to sunlight (xeroderma pigmentosum) may cause the condition. What increases the risk? This condition is more likely to develop in people who:  Are older than 81 years of age.  Have fair skin (light complexion).  Have blond or red hair.  Have blue, green, or gray eyes.  Have childhood freckling.  Have had sun exposure over long periods of time, especially during childhood.  Use tanning beds.  Have had psoralen and ultraviolet A (PUVA) treatments.  Have had repeated sunburns.  Have a weakened immune system.  Have an HPV (human papillomavirus) infection.  Have a history of precancerous lesions (actinic keratosis).  Have conditions that cause chronic scarring. These can include burn scars, chronic ulcers, heat (thermal) injuries, and radiation.  Have been exposed to certain chemicals, such as tar, soot, and arsenic.  Smoke. What are the signs or symptoms?  This condition often starts as a red, pink, or brown growth on the skin. The growths have an irregular  surface that may feel rough. In some cases, the growths are easier to feel than to see. The growths may develop into a sore that does not heal. How is this diagnosed? This condition may be diagnosed with:  A physical exam.  Removal of a tissue sample to be examined under a microscope (biopsy). How is this treated? Treatment for this condition involves removing the cancerous tissue. The method that is used for this depends on the size and location of the tumor, as well as your overall health. Possible treatments include:  Electrodesiccation and curettage. This involves alternately scraping and burning the tumor while using an electric current to control bleeding.  Cryosurgery. This involves freezing the tumor with liquid nitrogen.  Laser therapy. This uses an intense beam of light to remove the tumor.  Photodynamic therapy. A chemical cream is applied to the skin, and light exposure is used to activate the chemical.  High-energy rays that kill cancer cells (radiation therapy). This may be used for tumors that are deeper in the tissues.  Surgical removal (excision) of the tumor. This involves removing the entire tumor and a small amount of normal skin that surrounds it.  Mohs surgery. In this procedure, the cancerous skin cells are removed layer by layer until all of the tumor has been removed.  Plastic surgery. The tumor is removed, and healthy skin from another part of the body is used to cover the wound (skin graft).  Chemotherapy. This treatment uses medicines to destroy cancer cells.  Chemotherapy creams or lotions. These may be   applied directly to the skin where the tumor is located and may be used for smaller tumors.  Targeted therapy. This targets specific parts of cancer cells and the area around them to block the growth and spread of the cancer.  Immunotherapy. This treatment helps your body's immune system fight the cancer cells. Follow these instructions at home:   Avoid  being out in the sun. Wear protective clothing, including long-sleeved shirts, long pants, and a hat when outdoors.  Do skin self-exams as told by your health care provider. Look for any new spots or changes in your skin.  Do not use any products that contain nicotine or tobacco, such as cigarettes, e-cigarettes, and chewing tobacco. If you need help quitting, ask your health care provider.  Keep all follow-up visits as told by your health care provider. This is important. How is this prevented?   Avoid the sun when it is at its strongest. This is usually between 10:00 a.m. and 4:00 p.m.  When you are out in the sun, use a sunscreen that has a sun protection factor (SPF) of at least 30.  Apply sunscreen at least 30 minutes before exposure to the sun.  Reapply sunscreen every 2-4 hours while you are outside. Also reapply it after swimming and after excessive sweating.  Always wear hats, protective clothing, and UV-blocking sunglasses when you are outdoors.  Do not use tanning beds. Contact a health care provider if:  You notice any new growths or any changes in your skin.  You have had a squamous cell carcinoma tumor removed and you notice a new growth in the same location. Get help right away if you have:  A fever or chills.  Chest pain or difficulty breathing. Summary  Squamous cell carcinoma is a common form of skin cancer. It begins in the squamous cells in the outer layer of the skin (epidermis).  This condition is usually caused by exposure to ultraviolet (UV) light.  Treatment for this condition involves removing the cancerous tissue. The method that is used for this depends on the size and location of the tumor, as well as your overall health.  Contact a health care provider if you notice any new growths or any changes in your skin.  Keep all follow-up visits as told by your health care provider. This is important. This information is not intended to replace advice  given to you by your health care provider. Make sure you discuss any questions you have with your health care provider. Document Released: 11/05/2002 Document Revised: 01/22/2018 Document Reviewed: 01/22/2018 Elsevier Patient Education  2020 [employer]  

## 2019-04-23 DIAGNOSIS — C44629 Squamous cell carcinoma of skin of left upper limb, including shoulder: Secondary | ICD-10-CM | POA: Insufficient documentation

## 2019-05-26 ENCOUNTER — Telehealth: Payer: Self-pay | Admitting: Cardiovascular Disease

## 2019-05-26 ENCOUNTER — Other Ambulatory Visit: Payer: Self-pay | Admitting: Internal Medicine

## 2019-05-26 DIAGNOSIS — E1121 Type 2 diabetes mellitus with diabetic nephropathy: Secondary | ICD-10-CM

## 2019-05-26 DIAGNOSIS — I10 Essential (primary) hypertension: Secondary | ICD-10-CM

## 2019-05-26 NOTE — Telephone Encounter (Signed)
We are recommending the COVID-19 vaccine to all of our patients. Cardiac medications (including blood thinners) should not deter anyone from being vaccinated and there is no need to hold any of those medications prior to vaccine administration.     Currently, there is a hotline to call (active 05/23/19) to schedule vaccination appointments as no walk-ins will be accepted.   Number: [phone]    If you have further questions or concerns about the vaccine process, please visit [email or web address] or contact your primary care physician.

## 2019-06-10 DIAGNOSIS — C44629 Squamous cell carcinoma of skin of left upper limb, including shoulder: Secondary | ICD-10-CM | POA: Diagnosis not present

## 2019-06-23 DIAGNOSIS — D225 Melanocytic nevi of trunk: Secondary | ICD-10-CM | POA: Diagnosis not present

## 2019-06-23 DIAGNOSIS — L821 Other seborrheic keratosis: Secondary | ICD-10-CM | POA: Diagnosis not present

## 2019-06-23 DIAGNOSIS — L218 Other seborrheic dermatitis: Secondary | ICD-10-CM | POA: Diagnosis not present

## 2019-06-23 DIAGNOSIS — D1801 Hemangioma of skin and subcutaneous tissue: Secondary | ICD-10-CM | POA: Diagnosis not present

## 2019-06-23 DIAGNOSIS — C44319 Basal cell carcinoma of skin of other parts of face: Secondary | ICD-10-CM | POA: Diagnosis not present

## 2019-06-23 DIAGNOSIS — D485 Neoplasm of uncertain behavior of skin: Secondary | ICD-10-CM | POA: Diagnosis not present

## 2019-06-23 DIAGNOSIS — L400 Psoriasis vulgaris: Secondary | ICD-10-CM | POA: Diagnosis not present

## 2019-07-06 ENCOUNTER — Ambulatory Visit: Payer: Medicare HMO | Attending: Internal Medicine

## 2019-07-06 DIAGNOSIS — Z23 Encounter for immunization: Secondary | ICD-10-CM | POA: Insufficient documentation

## 2019-07-06 NOTE — Progress Notes (Signed)
   Covid-19 Vaccination Clinic  Name:  Matthew Villanueva    MRN: [id number] DOB: April 22, 1936  07/06/2019  [name] was observed post Covid-19 immunization for 15 minutes without incidence. He was provided with Vaccine Information Sheet and instruction to access the V-Safe system.   [name] was instructed to call 911 with any severe reactions post vaccine: Marland Kitchen Difficulty breathing  . Swelling of your face and throat  . A fast heartbeat  . A bad rash all over your body  . Dizziness and weakness    Immunizations Administered    Name Date Dose VIS Date Route   Pfizer COVID-19 Vaccine 07/06/2019 10:29 AM 0.3 mL 04/25/2019 Intramuscular   Manufacturer: [place]   Lot: [id number]   [place]: [id number]

## 2019-07-22 ENCOUNTER — Ambulatory Visit (INDEPENDENT_AMBULATORY_CARE_PROVIDER_SITE_OTHER): Payer: Medicare HMO | Admitting: Internal Medicine

## 2019-07-22 ENCOUNTER — Encounter: Payer: Self-pay | Admitting: Internal Medicine

## 2019-07-22 DIAGNOSIS — R05 Cough: Secondary | ICD-10-CM | POA: Diagnosis not present

## 2019-07-22 DIAGNOSIS — R059 Cough, unspecified: Secondary | ICD-10-CM

## 2019-07-22 MED ORDER — MONTELUKAST SODIUM 10 MG PO TABS: 10.0000 mg | ORAL_TABLET | Freq: Every day | ORAL | 0 refills | Status: DC

## 2019-07-22 NOTE — Progress Notes (Signed)
Virtual Visit via Video Note  I connected with Matthew Villanueva on 07/22/19 at  2:20 PM EST by a video enabled telemedicine application and verified that I am speaking with the correct person using two identifiers.  The patient and the provider were at separate locations throughout the entire encounter.   I discussed the limitations of evaluation and management by telemedicine and the availability of in person appointments. The patient expressed understanding and agreed to proceed. The patient and the provider were the only parties present for the visit unless noted in HPI below.  History of Present Illness: The patient is a 83 y.o. man with visit for dry cough. Started about 3-4 days ago. Has no fevers or chills. Denies SOB or n/v/d. Overall it is stable to mildly improving. Has tried mucinex over the counter which seemed to break up things slightly. Denies overt sinus symptoms but having some tearing in the eyes. Does not take allergy medication. Was planning to go to [facility] later in the week and desires testing to ensure not contagious. Has taken 1st covid-19 vaccine.   Observations/Objective: Appearance: normal, breathing appears normal, no coughing during visit, casual grooming, abdomen does not appear distended, throat slight drainage, memory normal, mental status is A and O times 3  Assessment and Plan: See problem oriented charting  Follow Up Instructions: covid-19 testing, rx singulair for likely allergic cough  I discussed the assessment and treatment plan with the patient. The patient was provided an opportunity to ask questions and all were answered. The patient agreed with the plan and demonstrated an understanding of the instructions.   The patient was advised to call back or seek an in-person evaluation if the symptoms worsen or if the condition fails to improve as anticipated.  Hoyt Koch, MD

## 2019-07-22 NOTE — Assessment & Plan Note (Signed)
Does not sound suspicious for covid-19 but given upcoming travel will get covid-19 testing. Rx singulair for post nasal drip as this could be cause of cough.

## 2019-07-23 DIAGNOSIS — R05 Cough: Secondary | ICD-10-CM | POA: Diagnosis not present

## 2019-07-23 DIAGNOSIS — J069 Acute upper respiratory infection, unspecified: Secondary | ICD-10-CM | POA: Diagnosis not present

## 2019-07-23 DIAGNOSIS — E119 Type 2 diabetes mellitus without complications: Secondary | ICD-10-CM | POA: Diagnosis not present

## 2019-07-29 ENCOUNTER — Telehealth: Payer: Self-pay | Admitting: Internal Medicine

## 2019-07-29 NOTE — Telephone Encounter (Signed)
See comment below and advise

## 2019-07-29 NOTE — Telephone Encounter (Signed)
   Patient calling for advice on taking second dose of Covid vaccine on 3/17 He recently finished taking antibiotic for bronchitis. States he still has symptoms. Patient has concerns about taking second dose  Please call

## 2019-07-30 ENCOUNTER — Ambulatory Visit (INDEPENDENT_AMBULATORY_CARE_PROVIDER_SITE_OTHER): Payer: Medicare HMO

## 2019-07-30 ENCOUNTER — Ambulatory Visit (INDEPENDENT_AMBULATORY_CARE_PROVIDER_SITE_OTHER): Payer: Medicare HMO | Admitting: Family Medicine

## 2019-07-30 ENCOUNTER — Other Ambulatory Visit: Payer: Self-pay

## 2019-07-30 ENCOUNTER — Ambulatory Visit: Payer: Medicare HMO

## 2019-07-30 VITALS — BP 148/76 | HR 65 | Temp 98.5°F | Ht 71.0 in | Wt 251.0 lb

## 2019-07-30 DIAGNOSIS — E1121 Type 2 diabetes mellitus with diabetic nephropathy: Secondary | ICD-10-CM | POA: Diagnosis not present

## 2019-07-30 DIAGNOSIS — J988 Other specified respiratory disorders: Secondary | ICD-10-CM | POA: Diagnosis not present

## 2019-07-30 DIAGNOSIS — Z1152 Encounter for screening for COVID-19: Secondary | ICD-10-CM | POA: Diagnosis not present

## 2019-07-30 DIAGNOSIS — R0602 Shortness of breath: Secondary | ICD-10-CM | POA: Diagnosis not present

## 2019-07-30 MED ORDER — BENZONATATE 100 MG PO CAPS: 100.0000 mg | ORAL_CAPSULE | Freq: Three times a day (TID) | ORAL | 0 refills | Status: DC | PRN

## 2019-07-30 MED ORDER — HYDROCOD POLST-CPM POLST ER 10-8 MG/5ML PO SUER: 5.0000 mL | Freq: Two times a day (BID) | ORAL | 0 refills | Status: DC | PRN

## 2019-07-30 MED ORDER — CEFDINIR 300 MG PO CAPS: 600.0000 mg | ORAL_CAPSULE | Freq: Every day | ORAL | 0 refills | Status: DC

## 2019-07-30 NOTE — Patient Instructions (Signed)
I am starting him on Omnicef 600 mg once daily for 10 days for coverage of bronchitis and sinusitis.  I recommend continuing Singulair once daily.  Also I have added benzonatate Per[place] which she can take up to 3 times a day as needed for cough and do not produce drowsiness.  I also prescribed you Tussionex which contains codeine to help with the nighttime cough and allow you to achieve better sleep.  Tussionex can cause severe drowsiness therefore avoid taking while driving.  I recomme contacting your vaccine site to notify them that you are being treated with antibiotics for total of 10 days in order to have your second dose of the Covid vaccine reschedule. We will notify you once we receive your results of your COVID-19 test which should be available on Friday, 08/01/2019.  We will notify you via MyChart.  If symptoms worsen or do not improve follow-up with your primary care provider and he can place you back on the schedule to be seen here at the respiratory clinic.     Sinusitis, Adult Sinusitis is soreness and swelling (inflammation) of your sinuses. Sinuses are hollow spaces in the bones around your face. They are located:  Around your eyes.  In the middle of your forehead.  Behind your nose.  In your cheekbones. Your sinuses and nasal passages are lined with a fluid called mucus. Mucus drains out of your sinuses. Swelling can trap mucus in your sinuses. This lets germs (bacteria, virus, or fungus) grow, which leads to infection. Most of the time, this condition is caused by a virus. What are the causes? This condition is caused by:  Allergies.  Asthma.  Germs.  Things that block your nose or sinuses.  Growths in the nose (nasal polyps).  Chemicals or irritants in the air.  Fungus (rare). What increases the risk? You are more likely to develop this condition if:  You have a weak body defense system (immune system).  You do a lot of swimming or diving.  You use nasal  sprays too much.  You smoke. What are the signs or symptoms? The main symptoms of this condition are pain and a feeling of pressure around the sinuses. Other symptoms include:  Stuffy nose (congestion).  Runny nose (drainage).  Swelling and warmth in the sinuses.  Headache.  Toothache.  A cough that may get worse at night.  Mucus that collects in the throat or the back of the nose (postnasal drip).  Being unable to smell and taste.  Being very tired (fatigue).  A fever.  Sore throat.  Bad breath. How is this diagnosed? This condition is diagnosed based on:  Your symptoms.  Your medical history.  A physical exam.  Tests to find out if your condition is short-term (acute) or long-term (chronic). Your doctor may: ? Check your nose for growths (polyps). ? Check your sinuses using a tool that has a light (endoscope). ? Check for allergies or germs. ? Do imaging tests, such as an MRI or CT scan. How is this treated? Treatment for this condition depends on the cause and whether it is short-term or long-term.  If caused by a virus, your symptoms should go away on their own within 10 days. You may be given medicines to relieve symptoms. They include: ? Medicines that shrink swollen tissue in the nose. ? Medicines that treat allergies (antihistamines). ? A spray that treats swelling of the nostrils. ? Rinses that help get rid of thick mucus in your nose (  nasal saline washes).  If caused by bacteria, your doctor may wait to see if you will get better without treatment. You may be given antibiotic medicine if you have: ? A very bad infection. ? A weak body defense system.  If caused by growths in the nose, you may need to have surgery. Follow these instructions at home: Medicines  Take, use, or apply over-the-counter and prescription medicines only as told by your doctor. These may include nasal sprays.  If you were prescribed an antibiotic medicine, take it as told  by your doctor. Do not stop taking the antibiotic even if you start to feel better. Hydrate and humidify   Drink enough water to keep your pee (urine) pale yellow.  Use a cool mist humidifier to keep the humidity level in your home above 50%.  Breathe in steam for 10-15 minutes, 3-4 times a day, or as told by your doctor. You can do this in the bathroom while a hot shower is running.  Try not to spend time in cool or dry air. Rest  Rest as much as you can.  Sleep with your head raised (elevated).  Make sure you get enough sleep each night. General instructions   Put a warm, moist washcloth on your face 3-4 times a day, or as often as told by your doctor. This will help with discomfort.  Wash your hands often with soap and water. If there is no soap and water, use hand sanitizer.  Do not smoke. Avoid being around people who are smoking (secondhand smoke).  Keep all follow-up visits as told by your doctor. This is important. Contact a doctor if:  You have a fever.  Your symptoms get worse.  Your symptoms do not get better within 10 days. Get help right away if:  You have a very bad headache.  You cannot stop throwing up (vomiting).  You have very bad pain or swelling around your face or eyes.  You have trouble seeing.  You feel confused.  Your neck is stiff.  You have trouble breathing. Summary  Sinusitis is swelling of your sinuses. Sinuses are hollow spaces in the bones around your face.  This condition is caused by tissues in your nose that become inflamed or swollen. This traps germs. These can lead to infection.  If you were prescribed an antibiotic medicine, take it as told by your doctor. Do not stop taking it even if you start to feel better.  Keep all follow-up visits as told by your doctor. This is important. This information is not intended to replace advice given to you by your health care provider. Make sure you discuss any questions you have with  your health care provider. Document Revised: 10/01/2017 Document Reviewed: 10/01/2017 Elsevier Patient Education  [place].  Acute Bronchitis, Adult  Acute bronchitis is when air tubes in the lungs (bronchi) suddenly get swollen. The condition can make it hard for you to breathe. In adults, acute bronchitis usually goes away within 2 weeks. A cough caused by bronchitis may last up to 3 weeks. Smoking, allergies, and asthma can make the condition worse. What are the causes? This condition is caused by:  Cold and flu viruses. The most common cause of this condition is the virus that causes the common cold.  Bacteria.  Substances that irritate the lungs, including: ? Smoke from cigarettes and other types of tobacco. ? Dust and pollen. ? Fumes from chemicals, gases, or burned fuel. ? Other materials that  pollute indoor or outdoor air.  Close contact with someone who has acute bronchitis. What increases the risk? The following factors may make you more likely to develop this condition:  A weak body's defense system. This is also called the immune system.  Any condition that affects your lungs and breathing, such as asthma. What are the signs or symptoms? Symptoms of this condition include:  A cough.  Coughing up clear, yellow, or green mucus.  Wheezing.  Chest congestion.  Shortness of breath.  A fever.  Body aches.  Chills.  A sore throat. How is this treated? Acute bronchitis may go away over time without treatment. Your doctor may recommend:  Drinking more fluids.  Taking a medicine for a fever or cough.  Using a device that gets medicine into your lungs (inhaler).  Using a vaporizer or a humidifier. These are machines that add water or moisture in the air to help with coughing and poor breathing. Follow these instructions at home:  Activity  Get a lot of rest.  Avoid places where there are fumes from chemicals.  Return to your normal  activities as told by your doctor. Ask your doctor what activities are safe for you. Lifestyle  Drink enough fluids to keep your pee (urine) pale yellow.  Do not drink alcohol.  Do not use any products that contain nicotine or tobacco, such as cigarettes, e-cigarettes, and chewing tobacco. If you need help quitting, ask your doctor. Be aware that: ? Your bronchitis will get worse if you smoke or breathe in other people's smoke (secondhand smoke). ? Your lungs will heal faster if you quit smoking. General instructions  Take over-the-counter and prescription medicines only as told by your doctor.  Use an inhaler, cool mist vaporizer, or humidifier as told by your doctor.  Rinse your mouth often with salt water. To make salt water, dissolve -1 tsp (3-6 g) of salt in 1 cup (237 mL) of warm water.  Keep all follow-up visits as told by your doctor. This is important. How is this prevented? To lower your risk of getting this condition again:  Wash your hands often with soap and water. If soap and water are not available, use hand sanitizer.  Avoid contact with people who have cold symptoms.  Try not to touch your mouth, nose, or eyes with your hands.  Make sure to get the flu shot every year. Contact a doctor if:  Your symptoms do not get better in 2 weeks.  You vomit more than once or twice.  You have symptoms of loss of fluid from your body (dehydration). These include: ? Dark urine. ? Dry skin or eyes. ? Increased thirst. ? Headaches. ? Confusion. ? Muscle cramps. Get help right away if:  You cough up blood.  You have chest pain.  You have very bad shortness of breath.  You become dehydrated.  You faint or keep feeling like you are going to faint.  You keep vomiting.  You have a very bad headache.  Your fever or chills get worse. These symptoms may be an emergency. Do not wait to see if the symptoms will go away. Get medical help right away. Call your local  emergency services (911 in the [place] Do not drive yourself to the hospital. Summary  Acute bronchitis is when air tubes in the lungs (bronchi) suddenly get swollen. In adults, acute bronchitis usually goes away within 2 weeks.  Take over-the-counter and prescription medicines only as told by your doctor.  Drink enough fluid to keep your pee (urine) pale yellow.  Contact a doctor if your symptoms do not improve after 2 weeks of treatment.  Get help right away if you cough up blood, faint, or have chest pain or shortness of breath. This information is not intended to replace advice given to you by your health care provider. Make sure you discuss any questions you have with your health care provider. Document Revised: 11/22/2018 Document Reviewed: 11/22/2018 Elsevier Patient Education  [place].

## 2019-07-30 NOTE — Telephone Encounter (Addendum)
° °  Patient calling, states he is getting 2nd dose ov vaccine this morning.  He states his bronchitis is worse. Seeking advice on extending antibiotic.  Call again from patient this afternoon Patient given appointment with Respiratory Clinic for today Patient was denied ability to get second dose of vaccine today.

## 2019-07-30 NOTE — Progress Notes (Signed)
Patient ID: Matthew Villanueva, male    DOB: January 28, 1936, 84 y.o.   MRN: [id number]  PCP: [name], MD  Chief Complaint  Patient presents with  . Bronchitis    Subjective:  HPI Matthew Villanueva is a 84 y.o. male presents to [facility] clinic for evaluation of symptoms concerning for possible COVID-19 virus. Current symptoms include purulent nasal drainage, bilateral eye crusting, eye irritation, and drainage,cough, with SOB occurring with cough.  Endorses a hx of recurrent bronchitis which is induced by changes in weather. He has received 1/2 COVID vaccines and was scheduled for second vaccine today. Due to illness, he was unable to obtain his second dose of COVID-19 vaccine. Onset of symptoms approximately 1 week ago. Endorses a history of bronchitis, although he feels that current course of illness is different. Unknown of exposure to individual positive for COVID-19. Current symptoms include: cough, shortness of breath. He has not been treated with antibiotics or prednisone. Symptoms initially thought to be allergies, PCP started him on Singulair. He denies improvement of symptoms and symptoms have worsened. High Risk for complications associated with COVID-19 due to age, diabetes, hypertension, and obesity   Review of Systems  Pertinent negatives listed in HPI   Patient Active Problem List   Diagnosis Date Noted  . SCC (squamous cell carcinoma), hand, left 04/23/2019  . 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 Numbness and tingling of both feet 12/03/2017  . Pure hyperglyceridemia 11/09/2015  . OSA (obstructive sleep apnea) 11/09/2015  . Statin intolerance 06/10/2014  . Cough 02/25/2014  . Chronic painful diabetic neuropathy ([place]) 12/24/2013  . Other male erectile dysfunction 12/24/2013  . 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  . Hyperlipidemia with target LDL less than 100 08/12/2013  . BPH associated with nocturia 08/12/2013  . Morbid  obesity ([place]) 05/24/2011  . Routine health maintenance 05/24/2011  . HEARING LOSS 04/28/2009  . DM (diabetes mellitus), type 2 with renal complications ([place]) 28/41/3244  . DJD of shoulder 07/23/2007  . Gout 06/25/2007  . Essential hypertension 06/25/2007      Prior to Admission medications   Medication Sig Start Date End Date Taking? Authorizing Provider  aspirin 81 MG tablet Take 81 mg by mouth daily.      [provider]  blood glucose meter kit and supplies KIT Use to test blood sugar up to twice daily. DX: E11.21 06/13/17   [name], MD  glucose blood (COOL BLOOD GLUCOSE TEST STRIPS) test strip Use to test blood sugar up to twice daily. DX: E11.21 06/01/17   [name], MD  glucose blood (ONETOUCH VERIO) test strip 1 each by Other route 2 (two) times daily. Use BID 09/10/18   [name], MD  JARDIANCE 25 MG TABS tablet TAKE ONE TABLET BY MOUTH DAILY 01/06/19   [name], MD  L-Methylfolate-Algae-B12-B6 [facility]) 3-90.314-2-35 MG CAPS Take 1 capsule by mouth 2 (two) times daily. 02/18/19   [name], MD  Lancets [facility] ULTRASOFT) lancets Use to test blood sugar up to twice daily. DX: E11.21 06/01/17   [name], MD  losartan-hydrochlorothiazide (HYZAAR) 100-25 MG tablet TAKE ONE TABLET BY MOUTH DAILY 05/26/19   [name], MD  montelukast (SINGULAIR) 10 MG tablet Take 1 tablet (10 mg total) by mouth at bedtime. 07/22/19   Hoyt Koch, MD  Semaglutide (RYBELSUS) 3 MG TABS Take 1 tablet by mouth daily. 11/25/18   [name], MD  sildenafil (REVATIO) 20 MG tablet TAKE ONE TABLET BY MOUTH ONCE DAILY AS NEEDED 03/27/18   [name], MD    Past Medical, Surgical Family and Social History reviewed and updated.    Objective:   Today's Vitals   07/30/19 1741  BP: (!) 148/76  Pulse: 65  Temp: 98.5 F (36.9 C)  TempSrc: Oral  SpO2: 96%  Weight: 251 lb (113.9 kg)  Height: '5\' 11"'$  (1.803 m)    Wt Readings from Last 3  Encounters:  04/22/19 249 lb (112.9 kg)  04/15/19 250 lb (113.4 kg)  02/18/19 254 lb (115.2 kg)   Physical Exam Constitutional:      Appearance: He is ill-appearing. He is not toxic-appearing.  HENT:     Nose: Mucosal edema and rhinorrhea present.     Right Turbinates: Enlarged.     Left Turbinates: Enlarged.     Right Sinus: Maxillary sinus tenderness present.     Left Sinus: Maxillary sinus tenderness present.  Cardiovascular:     Rate and Rhythm: Normal rate and regular rhythm.     Pulses: Normal pulses.     Heart sounds: Normal heart sounds.  Pulmonary:     Effort: Pulmonary effort is normal.     Breath sounds: Decreased breath sounds present.     Comments: Harsh non-productive cough noted during exam. Musculoskeletal:     Cervical back: Normal range of motion. Normal range of motion.  Lymphadenopathy:     Cervical: No cervical adenopathy.  Neurological:     General: No focal deficit present.     Mental Status: He is alert and oriented to person, place, and time.  Psychiatric:        Attention and Perception: Attention normal.        Mood and Affect: Mood normal.       Assessment & Plan:  1. Respiratory infection,afebrile  Bronchitis vs PNA ? COVID - DG Chest Portable 2 Views CLINICAL DATA:  Worsening shortness of breath. EXAM: CHEST  2 VIEW PORTABLE COMPARISON:  03/06/2016 FINDINGS: There is elevation of the right hemidiaphragm. The heart size is stable. There is no pneumothorax or large pleural effusion. There is no focal infiltrate. IMPRESSION: No active cardiopulmonary disease. Electronically Signed   By: Constance Holster M.D.   On: 07/30/2019 17:58  - CBC with Differential, rule out leucocytosis  -Start Omnicef 600 mg once daily x 7 days  -Cough management -Benzonate as prescribed during the daytime hours and Tussionex at bedtime.  2. Type 2 diabetes mellitus with diabetic nephropathy, without long-term current use of insulin ([place]),  -Home readings stable per  patient. Encouraged frequent monitoring of BS while ill. -Continue diabetes treatment as prescribed  - Comp Met (CMET)  3. Encounter for screening for COVID-19 - Novel Coronavirus, NAA ([facility]); pending   COVID-19 test pending. Patient encouraged to self isolate while test results is pending.  Meds ordered this encounter  Medications  . benzonatate (TESSALON) 100 MG capsule    Sig: Take 1-2 capsules (100-200 mg total) by mouth 3 (three) times daily as needed for cough.    Dispense:  40 capsule    Refill:  0  . cefdinir (OMNICEF) 300 MG capsule    Sig: Take 2 capsules (600 mg total) by mouth daily.    Dispense:  20 capsule    Refill:  0  . chlorpheniramine-HYDROcodone (TUSSIONEX PENNKINETIC ER) 10-8 MG/5ML SUER    Sig: Take 5 mLs by mouth every 12 (twelve) hours as needed for  cough.    Dispense:  100 mL    Refill:  0    -The patient was given clear instructions to go to ER or return to medical center if symptoms do not improve, worsen or new problems develop. The patient verbalized understanding.     Molli Barrows, FNP-C [facility], PRN Provider  [facility]. [place], [place] [facility] Phone: [phone] Clinic Fax: [phone] Clinic Hours: 5:30 pm -7:30 pm (Monday-Friday)

## 2019-07-31 LAB — SPECIMEN STATUS REPORT

## 2019-07-31 LAB — NOVEL CORONAVIRUS, NAA: SARS-CoV-2, NAA: NOT DETECTED

## 2019-07-31 NOTE — Telephone Encounter (Signed)
Pt has xray of chest done at the respiratory clinic.

## 2019-08-02 LAB — COMPREHENSIVE METABOLIC PANEL
AST: 28 IU/L (ref 0–40)
Albumin/Globulin Ratio: 1.3 (ref 1.2–2.2)
Albumin: 4.3 g/dL (ref 3.6–4.6)
Alkaline Phosphatase: 31 IU/L — ABNORMAL LOW (ref 39–117)
BUN/Creatinine Ratio: 14 (ref 10–24)
BUN: 15 mg/dL (ref 8–27)
Bilirubin Total: 0.3 mg/dL (ref 0.0–1.2)
CO2: 19 mmol/L — ABNORMAL LOW (ref 20–29)
Calcium: 9.7 mg/dL (ref 8.6–10.2)
Creatinine, Ser: 1.1 mg/dL (ref 0.76–1.27)
GFR calc Af Amer: 72 mL/min/{1.73_m2} (ref >59)
GFR calc non Af Amer: 63 mL/min/{1.73_m2} (ref >59)
Globulin, Total: 3.2 g/dL (ref 1.5–4.5)
Glucose: 149 mg/dL — ABNORMAL HIGH (ref 65–99)
Total Protein: 7.5 g/dL (ref 6.0–8.5)

## 2019-08-02 LAB — CBC WITH DIFFERENTIAL/PLATELET
Basophils Absolute: 0.1 10*3/uL (ref 0.0–0.2)
Basos: 1 %
EOS (ABSOLUTE): 0.3 10*3/uL (ref 0.0–0.4)
Eos: 2 %
Hematocrit: 51.9 % — ABNORMAL HIGH (ref 37.5–51.0)
Hemoglobin: 17.9 g/dL — ABNORMAL HIGH (ref 13.0–17.7)
Immature Grans (Abs): 0.1 10*3/uL (ref 0.0–0.1)
Immature Granulocytes: 1 %
Lymphocytes Absolute: 2.5 10*3/uL (ref 0.7–3.1)
Lymphs: 21 %
MCH: 30 pg (ref 26.6–33.0)
MCHC: 34.5 g/dL (ref 31.5–35.7)
MCV: 87 fL (ref 79–97)
Monocytes Absolute: 1 10*3/uL — ABNORMAL HIGH (ref 0.1–0.9)
Monocytes: 8 %
Neutrophils Absolute: 7.9 10*3/uL — ABNORMAL HIGH (ref 1.4–7.0)
Neutrophils: 67 %
Platelets: 269 10*3/uL (ref 150–450)
RBC: 5.97 x10E6/uL — ABNORMAL HIGH (ref 4.14–5.80)
RDW: 13.4 % (ref 11.6–15.4)
WBC: 11.8 10*3/uL — ABNORMAL HIGH (ref 3.4–10.8)

## 2019-08-06 ENCOUNTER — Ambulatory Visit: Payer: Medicare HMO

## 2019-08-17 ENCOUNTER — Other Ambulatory Visit: Payer: Self-pay | Admitting: Internal Medicine

## 2019-08-19 ENCOUNTER — Ambulatory Visit: Payer: Medicare HMO

## 2019-08-27 ENCOUNTER — Other Ambulatory Visit: Payer: Self-pay | Admitting: Internal Medicine

## 2019-08-27 DIAGNOSIS — I10 Essential (primary) hypertension: Secondary | ICD-10-CM

## 2019-08-27 DIAGNOSIS — E1121 Type 2 diabetes mellitus with diabetic nephropathy: Secondary | ICD-10-CM

## 2019-09-23 ENCOUNTER — Ambulatory Visit: Payer: Medicare HMO | Admitting: Internal Medicine

## 2019-09-23 ENCOUNTER — Encounter: Payer: Self-pay | Admitting: Internal Medicine

## 2019-09-23 DIAGNOSIS — R351 Nocturia: Secondary | ICD-10-CM

## 2019-09-23 DIAGNOSIS — N401 Enlarged prostate with lower urinary tract symptoms: Secondary | ICD-10-CM

## 2019-09-23 NOTE — Patient Instructions (Signed)
none

## 2019-09-23 NOTE — Progress Notes (Signed)
Patient ID: Matthew Villanueva, male   DOB: 1936/01/13, 84 y.o.   MRN: [id number] Unable to connect with pt by video or phone after multiple attempts  Cathlean Cower md

## 2019-09-24 ENCOUNTER — Other Ambulatory Visit: Payer: Self-pay

## 2019-09-24 DIAGNOSIS — L739 Follicular disorder, unspecified: Secondary | ICD-10-CM | POA: Diagnosis not present

## 2019-09-24 DIAGNOSIS — D485 Neoplasm of uncertain behavior of skin: Secondary | ICD-10-CM | POA: Diagnosis not present

## 2019-10-08 ENCOUNTER — Other Ambulatory Visit: Payer: Self-pay | Admitting: Internal Medicine

## 2019-10-08 DIAGNOSIS — E1121 Type 2 diabetes mellitus with diabetic nephropathy: Secondary | ICD-10-CM

## 2019-10-08 DIAGNOSIS — Z794 Long term (current) use of insulin: Secondary | ICD-10-CM

## 2019-10-09 ENCOUNTER — Telehealth: Payer: Self-pay | Admitting: Internal Medicine

## 2019-10-09 DIAGNOSIS — E1121 Type 2 diabetes mellitus with diabetic nephropathy: Secondary | ICD-10-CM

## 2019-10-09 DIAGNOSIS — Z794 Long term (current) use of insulin: Secondary | ICD-10-CM

## 2019-10-09 MED ORDER — EMPAGLIFLOZIN 25 MG PO TABS: 25.0000 mg | ORAL_TABLET | Freq: Every day | ORAL | 1 refills | Status: DC

## 2019-10-09 NOTE — Telephone Encounter (Signed)
New Message:   1.Medication Requested: JARDIANCE 25 MG TABS tablet 2. Pharmacy (Name, Street, [place]): [facility] [place], [place] [address] 3. On Med List: Yes  4. Last Visit with PCP: 04/22/19  5. Next visit date with PCP: None   Agent: Please be advised that RX refills may take up to 3 business days. We ask that you follow-up with your pharmacy.

## 2019-10-09 NOTE — Telephone Encounter (Signed)
erx sent as requested.  

## 2019-11-01 ENCOUNTER — Other Ambulatory Visit: Payer: Self-pay | Admitting: Internal Medicine

## 2019-11-01 DIAGNOSIS — E1121 Type 2 diabetes mellitus with diabetic nephropathy: Secondary | ICD-10-CM

## 2019-11-01 DIAGNOSIS — R69 Illness, unspecified: Secondary | ICD-10-CM | POA: Diagnosis not present

## 2019-11-22 ENCOUNTER — Other Ambulatory Visit: Payer: Self-pay | Admitting: Cardiovascular Disease

## 2019-11-22 DIAGNOSIS — E1140 Type 2 diabetes mellitus with diabetic neuropathy, unspecified: Secondary | ICD-10-CM

## 2019-11-29 ENCOUNTER — Other Ambulatory Visit: Payer: Self-pay | Admitting: Internal Medicine

## 2019-11-29 DIAGNOSIS — E1121 Type 2 diabetes mellitus with diabetic nephropathy: Secondary | ICD-10-CM

## 2019-11-29 DIAGNOSIS — I10 Essential (primary) hypertension: Secondary | ICD-10-CM

## 2019-12-02 DIAGNOSIS — H2513 Age-related nuclear cataract, bilateral: Secondary | ICD-10-CM | POA: Diagnosis not present

## 2019-12-02 DIAGNOSIS — H25013 Cortical age-related cataract, bilateral: Secondary | ICD-10-CM | POA: Diagnosis not present

## 2019-12-02 DIAGNOSIS — E119 Type 2 diabetes mellitus without complications: Secondary | ICD-10-CM | POA: Diagnosis not present

## 2019-12-02 DIAGNOSIS — H524 Presbyopia: Secondary | ICD-10-CM | POA: Diagnosis not present

## 2019-12-02 DIAGNOSIS — H35033 Hypertensive retinopathy, bilateral: Secondary | ICD-10-CM | POA: Diagnosis not present

## 2019-12-02 LAB — HM DIABETES EYE EXAM

## 2019-12-08 ENCOUNTER — Encounter: Payer: Self-pay | Admitting: Internal Medicine

## 2019-12-16 ENCOUNTER — Ambulatory Visit (INDEPENDENT_AMBULATORY_CARE_PROVIDER_SITE_OTHER): Payer: Medicare HMO | Admitting: Internal Medicine

## 2019-12-16 ENCOUNTER — Other Ambulatory Visit: Payer: Self-pay

## 2019-12-16 ENCOUNTER — Encounter: Payer: Self-pay | Admitting: Internal Medicine

## 2019-12-16 VITALS — BP 136/82 | HR 68 | Temp 98.3°F | Resp 16 | Ht 71.0 in | Wt 252.0 lb

## 2019-12-16 DIAGNOSIS — E1140 Type 2 diabetes mellitus with diabetic neuropathy, unspecified: Secondary | ICD-10-CM | POA: Diagnosis not present

## 2019-12-16 DIAGNOSIS — E785 Hyperlipidemia, unspecified: Secondary | ICD-10-CM

## 2019-12-16 DIAGNOSIS — E1121 Type 2 diabetes mellitus with diabetic nephropathy: Secondary | ICD-10-CM

## 2019-12-16 DIAGNOSIS — M1A39X0 Chronic gout due to renal impairment, multiple sites, without tophus (tophi): Secondary | ICD-10-CM | POA: Diagnosis not present

## 2019-12-16 DIAGNOSIS — M10071 Idiopathic gout, right ankle and foot: Secondary | ICD-10-CM

## 2019-12-16 DIAGNOSIS — I10 Essential (primary) hypertension: Secondary | ICD-10-CM

## 2019-12-16 DIAGNOSIS — E781 Pure hyperglyceridemia: Secondary | ICD-10-CM

## 2019-12-16 DIAGNOSIS — Z0000 Encounter for general adult medical examination without abnormal findings: Secondary | ICD-10-CM

## 2019-12-16 LAB — POCT GLYCOSYLATED HEMOGLOBIN (HGB A1C): Hemoglobin A1C: 8.3 % — AB (ref 4.0–5.6)

## 2019-12-16 MED ORDER — METANX 3-90.314-2-35 MG PO CAPS: 1.0000 | ORAL_CAPSULE | Freq: Two times a day (BID) | ORAL | 1 refills | Status: DC

## 2019-12-16 MED ORDER — RYBELSUS 3 MG PO TABS: 1.0000 | ORAL_TABLET | Freq: Every day | ORAL | 0 refills | Status: DC

## 2019-12-16 NOTE — Patient Instructions (Signed)

## 2019-12-16 NOTE — Progress Notes (Signed)
Subjective:  Patient ID: Matthew Villanueva, male    DOB: 1935/09/08  Age: 84 y.o. MRN: [id number]  CC: Annual Exam, Hypertension, Diabetes, and Hyperlipidemia  This visit occurred during the SARS-CoV-2 public health emergency.  Safety protocols were in place, including screening questions prior to the visit, additional usage of staff PPE, and extensive cleaning of exam room while observing appropriate contact time as indicated for disinfecting solutions.    HPI Daran Favaro presents for a CPX.  He is not taking Rybelsus.  He never tried it.  He read about the side effects and became concerned.  He has been taking the SGLT2 inhibitor.  He thinks his blood sugars have been well controlled.  He complains of worsening lower extremity numbness.  He is not taking the methyl folate supplement because he felt it was too expensive.  He denies any episodes of polys.  He tells me his blood pressure has been well controlled.  He denies headache, blurred vision, chest pain, shortness of breath, palpitations, edema, or fatigue.  Outpatient Medications Prior to Visit  Medication Sig Dispense Refill   blood glucose meter kit and supplies KIT Use to test blood sugar up to twice daily. DX: E11.21 1 each 0   empagliflozin (JARDIANCE) 25 MG TABS tablet Take 1 tablet (25 mg total) by mouth daily. 90 tablet 1   gabapentin (NEURONTIN) 100 MG capsule Take 100 mg by mouth 3 (three) times daily.     glucose blood (COOL BLOOD GLUCOSE TEST STRIPS) test strip Use to test blood sugar up to twice daily. DX: E11.21 100 each 12   Lancets (ONETOUCH ULTRASOFT) lancets Use to test blood sugar up to twice daily. DX: E11.21 100 each 12   losartan-hydrochlorothiazide (HYZAAR) 100-25 MG tablet TAKE ONE TABLET BY MOUTH DAILY 90 tablet 0   montelukast (SINGULAIR) 10 MG tablet TAKE ONE TABLET BY MOUTH EVERY NIGHT AT BEDTIME 30 tablet 0   ONETOUCH VERIO test strip USE TO TEST BLOOD SUGAR TWO TIMES A DAY 200 strip 0     aspirin 81 MG tablet Take 81 mg by mouth daily.       L-Methylfolate-Algae-B12-B6 (METANX) 3-90.314-2-35 MG CAPS Take 1 capsule by mouth 2 (two) times daily. 180 capsule 1   Semaglutide (RYBELSUS) 3 MG TABS Take 1 tablet by mouth daily. 30 tablet 0   benzonatate (TESSALON) 100 MG capsule Take 1-2 capsules (100-200 mg total) by mouth 3 (three) times daily as needed for cough. 40 capsule 0   cefdinir (OMNICEF) 300 MG capsule Take 2 capsules (600 mg total) by mouth daily. 20 capsule 0   chlorpheniramine-HYDROcodone (TUSSIONEX PENNKINETIC ER) 10-8 MG/5ML SUER Take 5 mLs by mouth every 12 (twelve) hours as needed for cough. 100 mL 0   No facility-administered medications prior to visit.    ROS Review of Systems  Constitutional: Positive for unexpected weight change (wt gain). Negative for appetite change, chills, diaphoresis and fatigue.  HENT: Negative.  Negative for trouble swallowing.   Eyes: Negative.  Negative for visual disturbance.  Respiratory: Negative for cough, chest tightness, shortness of breath and wheezing.   Cardiovascular: Negative for chest pain, palpitations and leg swelling.  Gastrointestinal: Negative for abdominal pain, constipation, diarrhea, nausea and vomiting.  Genitourinary: Negative.  Negative for difficulty urinating, dysuria, frequency and urgency.  Musculoskeletal: Negative.  Negative for arthralgias and myalgias.  Skin: Negative.  Negative for color change and pallor.  Neurological: Negative.  Negative for dizziness, weakness, light-headedness and headaches.  Hematological: Negative  for adenopathy. Does not bruise/bleed easily.  Psychiatric/Behavioral: Negative.     Objective:  BP 136/82 (BP Location: Left Arm, Patient Position: Sitting, Cuff Size: Large)    Pulse 68    Temp 98.3 F (36.8 C) (Oral)    Resp 16    Ht _0  (1.803 m)    Wt 252 lb (114.3 kg)    SpO2 94%    BMI 35.15 kg/m   BP Readings from Last 3 Encounters:  12/16/19 136/82  07/30/19  (!) 148/76  04/22/19 140/80    Wt Readings from Last 3 Encounters:  12/16/19 252 lb (114.3 kg)  07/30/19 251 lb (113.9 kg)  04/22/19 249 lb (112.9 kg)    Physical Exam Vitals reviewed.  Constitutional:      Appearance: He is obese.  HENT:     Nose: Nose normal.     Mouth/Throat:     Mouth: Mucous membranes are moist.  Eyes:     General: No scleral icterus.    Conjunctiva/sclera: Conjunctivae normal.  Cardiovascular:     Rate and Rhythm: Normal rate and regular rhythm.     Pulses: Normal pulses.     Heart sounds: No murmur heard.   Pulmonary:     Effort: Pulmonary effort is normal.     Breath sounds: No stridor. No wheezing, rhonchi or rales.  Abdominal:     General: Abdomen is flat.     Palpations: There is no mass.     Tenderness: There is no abdominal tenderness. There is no guarding.     Hernia: No hernia is present.  Musculoskeletal:        General: Normal range of motion.     Cervical back: Neck supple.     Right lower leg: No edema.     Left lower leg: No edema.  Lymphadenopathy:     Cervical: No cervical adenopathy.  Skin:    General: Skin is warm and dry.  Neurological:     General: No focal deficit present.     Mental Status: He is alert.  Psychiatric:        Mood and Affect: Mood normal.        Behavior: Behavior normal.     Lab Results  Component Value Date   WBC 9.3 12/16/2019   HGB 17.8 (H) 12/16/2019   HCT 52.6 (H) 12/16/2019   PLT 230 12/16/2019   GLUCOSE 175 (H) 12/16/2019   CHOL 156 12/16/2019   TRIG 228 (H) 12/16/2019   HDL 43 12/16/2019   LDLDIRECT 88.0 09/12/2016   LDLCALC 81 12/16/2019   ALT 21 12/16/2019   AST 17 12/16/2019   NA 136 12/16/2019   K 4.5 12/16/2019   CL 100 12/16/2019   CREATININE 1.03 12/16/2019   BUN 20 12/16/2019   CO2 26 12/16/2019   TSH 1.08 12/16/2019   PSA 2.86 12/03/2017   HGBA1C 8.3 (A) 12/16/2019   MICROALBUR 11.1 12/16/2019    DG ABD ACUTE 2+V W 1V CHEST  Result Date: 11/25/2018 CLINICAL  DATA:  Right flank pain for 5 days. History of hypertension and diabetes. EXAM: DG ABDOMEN ACUTE W/ 1V CHEST COMPARISON:  03/06/2016 FINDINGS: Small round calcification projects in the left mid abdomen, possibly within the lower pole of the left kidney. No other evidence of a renal stone. No evidence of a ureteral stone. Soft tissues otherwise unremarkable. Normal bowel gas pattern.  No free air. Cardiac silhouette is normal size. No mediastinal or hilar masses. No convincing adenopathy. Prominent  lung markings in the bases. Lungs otherwise clear. Elevated right hemidiaphragm. IMPRESSION: 1. No acute findings within the abdomen or pelvis. 2. Probable small nonobstructing stone in the lower pole the right kidney. No evidence of a ureteral stone. 3. No active cardiopulmonary disease. Electronically Signed   By: Lajean Manes M.D.   On: 11/25/2018 16:51    Assessment & Plan:   Dominic was seen today for annual exam, hypertension, diabetes and hyperlipidemia.  Diagnoses and all orders for this visit:  Essential hypertension-his blood pressure is adequately well controlled.  Electrolytes and renal function are normal.  We will continue the combination of an ARB and thiazide diuretic. -     CBC with Differential/Platelet; Future -     Urinalysis, Routine w reflex microscopic; Future -     TSH; Future -     BASIC METABOLIC PANEL WITH GFR; Future -     BASIC METABOLIC PANEL WITH GFR -     TSH -     Urinalysis, Routine w reflex microscopic -     CBC with Differential/Platelet  Type 2 diabetes mellitus with diabetic nephropathy, without long-term current use of insulin (HCC)-his A1c is at 8.3%.  He has worsening symptoms of peripheral diabetic neuropathy.  He agrees to start taking Rybelsus as directed. -     Cancel: Hemoglobin A1c; Future -     Microalbumin / creatinine urine ratio; Future -     BASIC METABOLIC PANEL WITH GFR; Future -     HM Diabetes Foot Exam -     POCT glycosylated hemoglobin (Hb  A1C) -     Semaglutide (RYBELSUS) 3 MG TABS; Take 1 tablet by mouth daily. -     BASIC METABOLIC PANEL WITH GFR -     Microalbumin / creatinine urine ratio  Hyperlipidemia with target LDL less than 100-he has achieved his LDL goal and is doing well on the statin. -     TSH; Future -     Hepatic function panel; Future -     Lipid panel; Future -     Lipid panel -     Hepatic function panel -     TSH  Routine health maintenance-exam completed, labs reviewed, vaccines reviewed and updated, no cancer screenings are indicated, patient education was given.  Pure hyperglyceridemia-I recommended that he treat this with icosapent ethyl which will also offer cardiovascular risk reduction. -     Lipid panel; Future -     Lipid panel -     Icosapent Ethyl (VASCEPA) 0.5 g CAPS; Take 4 capsules by mouth 2 (two) times daily.  Acute idiopathic gout of right foot  Chronic gout due to renal impairment of multiple sites without tophus-he has achieved his uric acid goal and has had no recent episodes of gouty arthropathy. -     Uric acid; Future -     Uric acid  Chronic painful diabetic neuropathy ([place]) -     L-Methylfolate-Algae-B12-B6 (METANX) 3-90.314-2-35 MG CAPS; Take 1 capsule by mouth 2 (two) times daily.  Other orders -     MICROSCOPIC MESSAGE   I have discontinued Mauri C. Dancer's aspirin, benzonatate, cefdinir, and chlorpheniramine-HYDROcodone. I am also having him start on Vascepa. Additionally, I am having him maintain his onetouch ultrasoft, glucose blood, blood glucose meter kit and supplies, montelukast, empagliflozin, OneTouch Verio, losartan-hydrochlorothiazide, gabapentin, Metanx, and Rybelsus.  Meds ordered this encounter  Medications   L-Methylfolate-Algae-B12-B6 (METANX) 3-90.314-2-35 MG CAPS    Sig: Take 1 capsule by  mouth 2 (two) times daily.    Dispense:  180 capsule    Refill:  1   Semaglutide (RYBELSUS) 3 MG TABS    Sig: Take 1 tablet by mouth daily.    Dispense:   30 tablet    Refill:  0   Icosapent Ethyl (VASCEPA) 0.5 g CAPS    Sig: Take 4 capsules by mouth 2 (two) times daily.    Dispense:  240 capsule    Refill:  5   In addition to time spent on CPE, I spent 50 minutes in preparing to see the patient by review of recent labs, imaging and procedures, obtaining and reviewing separately obtained history, communicating with the patient and family or caregiver, ordering medications, tests or procedures, and documenting clinical information in the EHR including the differential Dx, treatment, and any further evaluation and other management of 1. Essential hypertension 2. Type 2 diabetes mellitus with diabetic nephropathy, without long-term current use of insulin ([place]) 3. Hyperlipidemia with target LDL less than 100 4. Pure hyperglyceridemia 5. Chronic gout due to renal impairment of multiple sites without tophus 6. Chronic painful diabetic neuropathy ([place])     Follow-up: Return in about 3 months (around 03/17/2020).  Scarlette Calico, MD

## 2019-12-17 LAB — CBC WITH DIFFERENTIAL/PLATELET
Absolute Monocytes: 772 cells/uL (ref 200–950)
Basophils Absolute: 93 cells/uL (ref 0–200)
Basophils Relative: 1 %
Eosinophils Absolute: 112 cells/uL (ref 15–500)
Eosinophils Relative: 1.2 %
HCT: 52.6 % — ABNORMAL HIGH (ref 38.5–50.0)
Hemoglobin: 17.8 g/dL — ABNORMAL HIGH (ref 13.2–17.1)
Lymphs Abs: 2176 cells/uL (ref 850–3900)
MCH: 29.9 pg (ref 27.0–33.0)
MCHC: 33.8 g/dL (ref 32.0–36.0)
MCV: 88.4 fL (ref 80.0–100.0)
MPV: 11.3 fL (ref 7.5–12.5)
Monocytes Relative: 8.3 %
Neutro Abs: 6147 cells/uL (ref 1500–7800)
Neutrophils Relative %: 66.1 %
Platelets: 230 10*3/uL (ref 140–400)
RBC: 5.95 10*6/uL — ABNORMAL HIGH (ref 4.20–5.80)
RDW: 13.1 % (ref 11.0–15.0)
Total Lymphocyte: 23.4 %
WBC: 9.3 10*3/uL (ref 3.8–10.8)

## 2019-12-17 LAB — URINALYSIS, ROUTINE W REFLEX MICROSCOPIC
Bacteria, UA: NONE SEEN /HPF
Bilirubin Urine: NEGATIVE
Hgb urine dipstick: NEGATIVE
Hyaline Cast: NONE SEEN /LPF
Ketones, ur: NEGATIVE
Leukocytes,Ua: NEGATIVE
Nitrite: NEGATIVE
RBC / HPF: NONE SEEN /HPF (ref 0–2)
Specific Gravity, Urine: 1.032 (ref 1.001–1.03)
Squamous Epithelial / HPF: NONE SEEN /HPF (ref <=5)
WBC, UA: NONE SEEN /HPF (ref 0–5)
pH: <=5 (ref 5.0–8.0)

## 2019-12-17 LAB — BASIC METABOLIC PANEL WITHOUT GFR
BUN: 20 mg/dL (ref 7–25)
CO2: 26 mmol/L (ref 20–32)
Calcium: 9.9 mg/dL (ref 8.6–10.3)
Chloride: 100 mmol/L (ref 98–110)
Creat: 1.03 mg/dL (ref 0.70–1.11)
GFR, Est African American: 79 mL/min/1.73m2
GFR, Est Non African American: 68 mL/min/1.73m2
Glucose, Bld: 175 mg/dL — ABNORMAL HIGH (ref 65–99)
Potassium: 4.5 mmol/L (ref 3.5–5.3)
Sodium: 136 mmol/L (ref 135–146)

## 2019-12-17 LAB — HEPATIC FUNCTION PANEL
AG Ratio: 1.3 (calc) (ref 1.0–2.5)
ALT: 21 U/L (ref 9–46)
AST: 17 U/L (ref 10–35)
Albumin: 4.1 g/dL (ref 3.6–5.1)
Alkaline phosphatase (APISO): 24 U/L — ABNORMAL LOW (ref 35–144)
Bilirubin, Direct: 0.1 mg/dL (ref 0.0–0.2)
Globulin: 3.1 g/dL (calc) (ref 1.9–3.7)
Indirect Bilirubin: 0.4 mg/dL (calc) (ref 0.2–1.2)
Total Bilirubin: 0.5 mg/dL (ref 0.2–1.2)
Total Protein: 7.2 g/dL (ref 6.1–8.1)

## 2019-12-17 LAB — MICROALBUMIN / CREATININE URINE RATIO
Creatinine, Urine: 51 mg/dL (ref 20–320)
Microalb Creat Ratio: 218 ug/mg{creat} — ABNORMAL HIGH
Microalb, Ur: 11.1 mg/dL

## 2019-12-17 LAB — TSH: TSH: 1.08 mIU/L (ref 0.40–4.50)

## 2019-12-17 LAB — URIC ACID: Uric Acid, Serum: 6.7 mg/dL (ref 4.0–8.0)

## 2019-12-17 LAB — LIPID PANEL
Cholesterol: 156 mg/dL (ref <200)
HDL: 43 mg/dL (ref >=40)
LDL Cholesterol (Calc): 81 mg/dL (calc)
Non-HDL Cholesterol (Calc): 113 mg/dL (calc) (ref <130)
Total CHOL/HDL Ratio: 3.6 (calc) (ref <5.0)
Triglycerides: 228 mg/dL — ABNORMAL HIGH (ref <150)

## 2019-12-17 MED ORDER — VASCEPA 0.5 G PO CAPS: 4.0000 | ORAL_CAPSULE | Freq: Two times a day (BID) | ORAL | 5 refills | Status: DC

## 2020-01-01 ENCOUNTER — Telehealth: Payer: Self-pay

## 2020-01-01 NOTE — Telephone Encounter (Signed)
New message    The patient has questions asking for a call back from the [facility] regarding a new prescription does not know the name of it

## 2020-01-01 NOTE — Telephone Encounter (Signed)
Pt contacted and informed of lipid panel result and informed that the elevated triglycerides is the reason the Vascepa was prescribed.   Pt stated understanding and will start the prescription.

## 2020-01-13 ENCOUNTER — Encounter: Payer: Self-pay | Admitting: Internal Medicine

## 2020-01-13 ENCOUNTER — Ambulatory Visit (INDEPENDENT_AMBULATORY_CARE_PROVIDER_SITE_OTHER): Payer: Medicare HMO | Admitting: Internal Medicine

## 2020-01-13 ENCOUNTER — Other Ambulatory Visit: Payer: Self-pay

## 2020-01-13 VITALS — BP 132/78 | HR 73 | Temp 98.2°F | Resp 16 | Ht 71.0 in | Wt 250.0 lb

## 2020-01-13 DIAGNOSIS — I10 Essential (primary) hypertension: Secondary | ICD-10-CM

## 2020-01-13 DIAGNOSIS — E1121 Type 2 diabetes mellitus with diabetic nephropathy: Secondary | ICD-10-CM | POA: Diagnosis not present

## 2020-01-13 DIAGNOSIS — Z23 Encounter for immunization: Secondary | ICD-10-CM

## 2020-01-13 MED ORDER — RYBELSUS 7 MG PO TABS: 1.0000 | ORAL_TABLET | Freq: Every day | ORAL | 1 refills | Status: DC

## 2020-01-13 NOTE — Patient Instructions (Signed)
Type 2 Diabetes Mellitus, Diagnosis, Adult Type 2 diabetes (type 2 diabetes mellitus) is a long-term (chronic) disease. In type 2 diabetes, one or both of these problems may be present:  The pancreas does not make enough of a hormone called insulin.  Cells in the body do not respond properly to insulin that the body makes (insulin resistance). Normally, insulin allows blood sugar (glucose) to enter cells in the body. The cells use glucose for energy. Insulin resistance or lack of insulin causes excess glucose to build up in the blood instead of going into cells. As a result, high blood glucose (hyperglycemia) develops. What increases the risk? The following factors may make you more likely to develop type 2 diabetes:  Having a family member with type 2 diabetes.  Being overweight or obese.  Having an inactive (sedentary) lifestyle.  Having been diagnosed with insulin resistance.  Having a history of prediabetes, gestational diabetes, or polycystic ovary syndrome (PCOS).  Being of American-Indian, African-American, Hispanic/Latino, or Asian/Pacific Islander descent. What are the signs or symptoms? In the early stage of this condition, you may not have symptoms. Symptoms develop slowly and may include:  Increased thirst (polydipsia).  Increased hunger(polyphagia).  Increased urination (polyuria).  Increased urination during the night (nocturia).  Unexplained weight loss.  Frequent infections that keep coming back (recurring).  Fatigue.  Weakness.  Vision changes, such as blurry vision.  Cuts or bruises that are slow to heal.  Tingling or numbness in the hands or feet.  Dark patches on the skin (acanthosis nigricans). How is this diagnosed? This condition is diagnosed based on your symptoms, your medical history, a physical exam, and your blood glucose level. Your blood glucose may be checked with one or more of the following blood tests:  A fasting blood glucose (FBG)  test. You will not be allowed to eat (you will fast) for 8 hours or longer before a blood sample is taken.  A random blood glucose test. This test checks blood glucose at any time of day regardless of when you ate.  An A1c (hemoglobin A1c) blood test. This test provides information about blood glucose control over the previous 2-3 months.  An oral glucose tolerance test (OGTT). This test measures your blood glucose at two times: ? After fasting. This is your baseline blood glucose level. ? Two hours after drinking a beverage that contains glucose. You may be diagnosed with type 2 diabetes if:  Your FBG level is 126 mg/dL (7.0 mmol/L) or higher.  Your random blood glucose level is 200 mg/dL (11.1 mmol/L) or higher.  Your A1c level is 6.5% or higher.  Your OGTT result is higher than 200 mg/dL (11.1 mmol/L). These blood tests may be repeated to confirm your diagnosis. How is this treated? Your treatment may be managed by a specialist called an endocrinologist. Type 2 diabetes may be treated by following instructions from your health care provider about:  Making diet and lifestyle changes. This may include: ? Following an individualized nutrition plan that is developed by a diet and nutrition specialist (registered dietitian). ? Exercising regularly. ? Finding ways to manage stress.  Checking your blood glucose level as often as told.  Taking diabetes medicines or insulin daily. This helps to keep your blood glucose levels in the healthy range. ? If you use insulin, you may need to adjust the dosage depending on how physically active you are and what foods you eat. Your health care provider will tell you how to adjust your dosage.    Taking medicines to help prevent complications from diabetes, such as: ? Aspirin. ? Medicine to lower cholesterol. ? Medicine to control blood pressure. Your health care provider will set individualized treatment goals for you. Your goals will be based on  your age, other medical conditions you have, and how you respond to diabetes treatment. Generally, the goal of treatment is to maintain the following blood glucose levels:  Before meals (preprandial): 80-130 mg/dL (4.4-7.2 mmol/L).  After meals (postprandial): below 180 mg/dL (10 mmol/L).  A1c level: less than 7%. Follow these instructions at home: Questions to ask your health care provider  Consider asking the following questions: ? Do I need to meet with a diabetes educator? ? Where can I find a support group for people with diabetes? ? What equipment will I need to manage my diabetes at home? ? What diabetes medicines do I need, and when should I take them? ? How often do I need to check my blood glucose? ? What number can I call if I have questions? ? When is my next appointment? General instructions  Take over-the-counter and prescription medicines only as told by your health care provider.  Keep all follow-up visits as told by your health care provider. This is important.  For more information about diabetes, visit: ? American Diabetes Association (ADA): www.diabetes.org ? American Association of Diabetes Educators (AADE): www.diabeteseducator.org Contact a health care provider if:  Your blood glucose is at or above 240 mg/dL (13.3 mmol/L) for 2 days in a row.  You have been sick or have had a fever for 2 days or longer, and you are not getting better.  You have any of the following problems for more than 6 hours: ? You cannot eat or drink. ? You have nausea and vomiting. ? You have diarrhea. Get help right away if:  Your blood glucose is lower than 54 mg/dL (3.0 mmol/L).  You become confused or you have trouble thinking clearly.  You have difficulty breathing.  You have moderate or large ketone levels in your urine. Summary  Type 2 diabetes (type 2 diabetes mellitus) is a long-term (chronic) disease. In type 2 diabetes, the pancreas does not make enough of a  hormone called insulin, or cells in the body do not respond properly to insulin that the body makes (insulin resistance).  This condition is treated by making diet and lifestyle changes and taking diabetes medicines or insulin.  Your health care provider will set individualized treatment goals for you. Your goals will be based on your age, other medical conditions you have, and how you respond to diabetes treatment.  Keep all follow-up visits as told by your health care provider. This is important. This information is not intended to replace advice given to you by your health care provider. Make sure you discuss any questions you have with your health care provider. Document Revised: 06/29/2017 Document Reviewed: 06/04/2015 Elsevier Patient Education  2020 [employer]  

## 2020-01-13 NOTE — Progress Notes (Signed)
Subjective:  Patient ID: Matthew Villanueva, male    DOB: 31-Mar-1936  Age: 84 y.o. MRN: [id number]  CC: Diabetes  This visit occurred during the SARS-CoV-2 public health emergency.  Safety protocols were in place, including screening questions prior to the visit, additional usage of staff PPE, and extensive cleaning of exam room while observing appropriate contact time as indicated for disinfecting solutions.    HPI Matthew Villanueva presents for f/up - He started taking an oral GLP-1 agonist about a month ago.  He has followed the instructions and is tolerating it well with no abdominal pain, nausea, vomiting, or loss of appetite.  He said it has helped lower his blood sugars.  Outpatient Medications Prior to Visit  Medication Sig Dispense Refill  . blood glucose meter kit and supplies KIT Use to test blood sugar up to twice daily. 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 empagliflozin (JARDIANCE) 25 MG TABS tablet Take 1 tablet (25 mg total) by mouth daily. 90 tablet 1  . gabapentin (NEURONTIN) 100 MG capsule Take 100 mg by mouth 3 (three) times daily.    Marland Kitchen glucose blood (COOL BLOOD GLUCOSE TEST STRIPS) test strip Use to test blood sugar up to twice daily. DX: E11.21 100 each 12  . Icosapent Ethyl (VASCEPA) 0.5 g CAPS Take 4 capsules by mouth 2 (two) times daily. 240 capsule 5  . L-Methylfolate-Algae-B12-B6 (METANX) 3-90.314-2-35 MG CAPS Take 1 capsule by mouth 2 (two) times daily. 180 capsule 1  . Lancets (ONETOUCH ULTRASOFT) lancets Use to test blood sugar up to twice daily. DX: E11.21 100 each 12  . losartan-hydrochlorothiazide (HYZAAR) 100-25 MG tablet TAKE ONE TABLET BY MOUTH DAILY 90 tablet 0  . montelukast (SINGULAIR) 10 MG tablet TAKE ONE TABLET BY MOUTH EVERY NIGHT AT BEDTIME 30 tablet 0  . ONETOUCH VERIO test strip USE TO TEST BLOOD SUGAR TWO TIMES A DAY 200 strip 0  . Semaglutide (RYBELSUS) 3 MG TABS Take 1 tablet by mouth daily. 30 tablet 0   No facility-administered medications prior to  visit.    ROS Review of Systems  Constitutional: Negative.  Negative for appetite change, diaphoresis and fatigue.  HENT: Negative.   Eyes: Negative for visual disturbance.  Respiratory: Negative for cough, chest tightness, shortness of breath and wheezing.   Cardiovascular: Negative for chest pain, palpitations and leg swelling.  Gastrointestinal: Negative for abdominal pain, constipation, diarrhea, nausea and vomiting.  Endocrine: Negative.   Genitourinary: Negative.  Negative for difficulty urinating.  Musculoskeletal: Negative for arthralgias and myalgias.  Skin: Negative.  Negative for color change and pallor.  Neurological: Negative.  Negative for dizziness, weakness, light-headedness and numbness.  Hematological: Negative for adenopathy. Does not bruise/bleed easily.  Psychiatric/Behavioral: Negative.     Objective:  BP 132/78   Pulse 73   Temp 98.2 F (36.8 C) (Oral)   Resp 16   Ht 5' 11" (1.803 m)   Wt 250 lb (113.4 kg)   SpO2 93%   BMI 34.87 kg/m   BP Readings from Last 3 Encounters:  01/13/20 132/78  12/16/19 136/82  07/30/19 (!) 148/76    Wt Readings from Last 3 Encounters:  01/13/20 250 lb (113.4 kg)  12/16/19 252 lb (114.3 kg)  07/30/19 251 lb (113.9 kg)    Physical Exam Vitals reviewed.  HENT:     Nose: Nose normal.     Mouth/Throat:     Mouth: Mucous membranes are moist.  Eyes:     General: No scleral icterus.  Conjunctiva/sclera: Conjunctivae normal.  Cardiovascular:     Rate and Rhythm: Normal rate and regular rhythm.     Heart sounds: No murmur heard.   Pulmonary:     Effort: Pulmonary effort is normal.     Breath sounds: No stridor. No wheezing, rhonchi or rales.  Abdominal:     General: Abdomen is protuberant. Bowel sounds are normal. There is no distension.     Palpations: Abdomen is soft. There is no hepatomegaly, splenomegaly or mass.  Musculoskeletal:        General: Normal range of motion.     Cervical back: Neck supple.      Right lower leg: No edema.     Left lower leg: No edema.  Lymphadenopathy:     Cervical: No cervical adenopathy.  Skin:    General: Skin is warm and dry.     Coloration: Skin is not pale.  Neurological:     General: No focal deficit present.     Mental Status: He is alert.     Lab Results  Component Value Date   WBC 9.3 12/16/2019   HGB 17.8 (H) 12/16/2019   HCT 52.6 (H) 12/16/2019   PLT 230 12/16/2019   GLUCOSE 175 (H) 12/16/2019   CHOL 156 12/16/2019   TRIG 228 (H) 12/16/2019   HDL 43 12/16/2019   LDLDIRECT 88.0 09/12/2016   LDLCALC 81 12/16/2019   ALT 21 12/16/2019   AST 17 12/16/2019   NA 136 12/16/2019   K 4.5 12/16/2019   CL 100 12/16/2019   CREATININE 1.03 12/16/2019   BUN 20 12/16/2019   CO2 26 12/16/2019   TSH 1.08 12/16/2019   PSA 2.86 12/03/2017   HGBA1C 8.3 (A) 12/16/2019   MICROALBUR 11.1 12/16/2019    DG ABD ACUTE 2+V W 1V CHEST  Result Date: 11/25/2018 CLINICAL DATA:  Right flank pain for 5 days. History of hypertension and diabetes. EXAM: DG ABDOMEN ACUTE W/ 1V CHEST COMPARISON:  03/06/2016 FINDINGS: Small round calcification projects in the left mid abdomen, possibly within the lower pole of the left kidney. No other evidence of a renal stone. No evidence of a ureteral stone. Soft tissues otherwise unremarkable. Normal bowel gas pattern.  No free air. Cardiac silhouette is normal size. No mediastinal or hilar masses. No convincing adenopathy. Prominent lung markings in the bases. Lungs otherwise clear. Elevated right hemidiaphragm. IMPRESSION: 1. No acute findings within the abdomen or pelvis. 2. Probable small nonobstructing stone in the lower pole the right kidney. No evidence of a ureteral stone. 3. No active cardiopulmonary disease. Electronically Signed   By: Matthew Villanueva M.D.   On: 11/25/2018 16:51    Assessment & Plan:   Matthew Villanueva was seen today for diabetes.  Diagnoses and all orders for this visit:  Essential hypertension- His blood pressure  is well controlled.  Type 2 diabetes mellitus with diabetic nephropathy, without long-term current use of insulin (Matthew Villanueva)- Will increase the dose of semaglutide to 7 mg a day.  He will continue taking the current dose of the SGLT2 inhibitor. -     Semaglutide (RYBELSUS) 7 MG TABS; Take 1 tablet by mouth daily.  Other orders -     Flu Vaccine QUAD 6+ mos PF IM (Fluarix Quad PF)   I have discontinued Nicholaos C. Burpee's Rybelsus. I am also having him start on Rybelsus. Additionally, I am having him maintain his onetouch ultrasoft, glucose blood, blood glucose meter kit and supplies, montelukast, empagliflozin, OneTouch Verio, losartan-hydrochlorothiazide, gabapentin, Metanx, and  Vascepa.  Meds ordered this encounter  Medications  . Semaglutide (RYBELSUS) 7 MG TABS    Sig: Take 1 tablet by mouth daily.    Dispense:  90 tablet    Refill:  1     Follow-up: Return in about 6 months (around 07/12/2020).  Scarlette Calico, MD

## 2020-01-20 ENCOUNTER — Telehealth: Payer: Self-pay | Admitting: Internal Medicine

## 2020-01-20 NOTE — Telephone Encounter (Signed)
Patient called and was wondering why his Semaglutide (RYBELSUS) 7 MG was increased from 3mg  to 7mg  since his bllod sugar is "normal". Patient is requesting a call back.

## 2020-01-20 NOTE — Telephone Encounter (Signed)
Pt has been informed and expressed understanding.  

## 2020-01-28 DIAGNOSIS — N529 Male erectile dysfunction, unspecified: Secondary | ICD-10-CM | POA: Diagnosis not present

## 2020-01-28 DIAGNOSIS — Z7984 Long term (current) use of oral hypoglycemic drugs: Secondary | ICD-10-CM | POA: Diagnosis not present

## 2020-01-28 DIAGNOSIS — E785 Hyperlipidemia, unspecified: Secondary | ICD-10-CM | POA: Diagnosis not present

## 2020-01-28 DIAGNOSIS — Z7982 Long term (current) use of aspirin: Secondary | ICD-10-CM | POA: Diagnosis not present

## 2020-01-28 DIAGNOSIS — I10 Essential (primary) hypertension: Secondary | ICD-10-CM | POA: Diagnosis not present

## 2020-01-28 DIAGNOSIS — E1165 Type 2 diabetes mellitus with hyperglycemia: Secondary | ICD-10-CM | POA: Diagnosis not present

## 2020-01-28 DIAGNOSIS — Z809 Family history of malignant neoplasm, unspecified: Secondary | ICD-10-CM | POA: Diagnosis not present

## 2020-01-28 DIAGNOSIS — E1143 Type 2 diabetes mellitus with diabetic autonomic (poly)neuropathy: Secondary | ICD-10-CM | POA: Diagnosis not present

## 2020-01-28 DIAGNOSIS — Z6837 Body mass index (BMI) 37.0-37.9, adult: Secondary | ICD-10-CM | POA: Diagnosis not present

## 2020-02-09 ENCOUNTER — Telehealth: Payer: Self-pay | Admitting: Internal Medicine

## 2020-02-09 DIAGNOSIS — M25562 Pain in left knee: Secondary | ICD-10-CM | POA: Diagnosis not present

## 2020-02-09 DIAGNOSIS — E1121 Type 2 diabetes mellitus with diabetic nephropathy: Secondary | ICD-10-CM

## 2020-02-09 NOTE — Progress Notes (Signed)
  Chronic Care Management   Note  02/09/2020 Name: Matthew Villanueva MRN: [id number] DOB: 09-27-1935  Matthew Villanueva is a 84 y.o. year old male who is a primary care patient of [name], MD. I reached out to [name] by phone today in response to a referral sent by Mr. Nicklas Mcsweeney Buckle's PCP, [name], MD.   Mr. Cadieux was given information about Chronic Care Management services today including:  1. CCM service includes personalized support from designated clinical staff supervised by his physician, including individualized plan of care and coordination with other care providers 2. 24/7 contact phone numbers for assistance for urgent and routine care needs. 3. Service will only be billed when office clinical staff spend 20 minutes or more in a month to coordinate care. 4. Only one practitioner may furnish and bill the service in a calendar month. 5. The patient may stop CCM services at any time (effective at the end of the month) by phone call to the office staff.   Patient agreed to services and verbal consent obtained.   Follow up plan:   Carley Perdue [employer] Scheduler

## 2020-02-17 NOTE — Progress Notes (Signed)
Cardiology Office Note    Date:  03/01/2020   ID:  Matthew Villanueva, DOB 11/23/1935, MRN [id number]  PCP:  [name], MD  Cardiologist:  Dr. Johnsie Cancel   CC:  Nausea   History of Present Illness:  Matthew Villanueva is a 84 y.o. 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 Arthritis   . Diabetes mellitus    NO INSULIN  . Dyspnea   . Gout   . History of colonic polyps   . HTN (hypertension)   . Localized osteoarthrosis not specified whether primary or secondary, hand   . OSA (obstructive sleep apnea)   . Other specified cardiac dysrhythmias(427.89) 2004   ABLATION DUE TO TACHYCARDIA  . Right bundle branch block     Past Surgical History:  Procedure Laterality Date  . CARDIAC ELECTROPHYSIOLOGY STUDY AND ABLATION  2004   ablation for SVT with a chronic right bundle branch block   . [place]  . PILONIDAL CYST DRAINAGE      Current Medications: Outpatient Medications Prior to Visit  Medication Sig Dispense Refill  . blood glucose meter kit and supplies KIT Use to test blood sugar up to twice daily. 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 empagliflozin (JARDIANCE) 25 MG TABS tablet Take 1 tablet (25 mg total) by mouth daily. 90 tablet 1  . gabapentin (NEURONTIN) 100 MG capsule Take 100 mg by mouth 3 (three) times daily.    Marland Kitchen glucose blood (COOL BLOOD GLUCOSE TEST STRIPS) test strip Use to test blood  sugar up to twice daily. DX: E11.21 100 each 12  . Icosapent Ethyl (VASCEPA) 0.5 g CAPS Take 4 capsules by mouth 2 (two) times daily. 240 capsule 5  . L-Methylfolate-Algae-B12-B6 (METANX) 3-90.314-2-35 MG CAPS Take 1 capsule by mouth 2 (two) times daily. 180 capsule 1  . Lancets (ONETOUCH ULTRASOFT) lancets Use to test blood sugar up to twice daily. DX: E11.21 100 each 12  . losartan-hydrochlorothiazide (HYZAAR) 100-25 MG tablet TAKE ONE TABLET BY MOUTH DAILY 90 tablet 1  . ONETOUCH VERIO test strip USE TO TEST BLOOD SUGAR TWO TIMES A DAY 200 strip 0  . Semaglutide (RYBELSUS) 7 MG TABS Take 1 tablet by mouth daily. 90 tablet 1  . montelukast (SINGULAIR) 10 MG tablet TAKE ONE TABLET BY MOUTH EVERY NIGHT AT BEDTIME (Patient not taking: Reported on 03/01/2020) 30 tablet 0   No facility-administered medications prior to visit.     Allergies:   Livalo [pitavastatin], Crestor [rosuvastatin], Metformin and related, and Pravastatin   Social History   Socioeconomic History  . Marital status: Single    Spouse name: Not on file  . Number of children: Not on file  . Years of education: 54  . Highest education level: Not on file  Occupational History  . Occupation: self  employed, Chemical engineer: SELF-EMPLOYED  Tobacco Use  . Smoking status: Former Smoker    Packs/day: 2.00    Years: 30.00    Pack years: 60.00    Types: Cigarettes    Quit date: 05/15/1984    Years since quitting: 35.8  . Smokeless tobacco: Never Used  Vaping Use  . Vaping Use: Never used  Substance and Sexual Activity  . Alcohol use: No    Comment: rare use  . Drug use: No  . Sexual activity: Not Currently  Other Topics Concern  . Not on file  Social History Narrative   HSG, [place]   Single   Work: self employed...Recruitment consultant   I-ADLs   End-of-Life: Yes-CPR, Yes-short-term Mechanical Ventilation, but no prolonged ventilation. Is willing to undergo dialysis, prolonged tub feeding.   Pt states  former smoker. 1986. 2ppd. Started age 78   Social Determinants of Health   Financial Resource Strain:   . Difficulty of Paying Living Expenses: Not on file  Food Insecurity:   . Worried About Charity fundraiser in the Last Year: Not on file  . Ran Out of Food in the Last Year: Not on file  Transportation Needs:   . Lack of Transportation (Medical): Not on file  . Lack of Transportation (Non-Medical): Not on file  Physical Activity:   . Days of Exercise per Week: Not on file  . Minutes of Exercise per Session: Not on file  Stress:   . Feeling of Stress : Not on file  Social Connections:   . Frequency of Communication with Friends and Family: Not on file  . Frequency of Social Gatherings with Friends and Family: Not on file  . Attends Religious Services: Not on file  . Active Member of Clubs or Organizations: Not on file  . Attends Archivist Meetings: Not on file  . Marital Status: Not on file     Family History:  The patient's family history includes Cancer in his father; Diabetes in his maternal uncle; Heart attack in his maternal grandmother; Heart disease in his maternal aunt, maternal grandfather, maternal grandmother, and mother; Kidney cancer in his sister.     ROS:   Please see the history of present illness.    ROS All other systems reviewed and are negative.   PHYSICAL EXAM:   VS:  BP 134/76   Pulse 73   Ht $R'5\' 11"'qX$  (1.803 m)   Wt 249 lb 6.4 oz (113.1 kg)   SpO2 94%   BMI 34.78 kg/m    Affect appropriate Overweight white male  HEENT: normal Neck supple with no adenopathy JVP normal no bruits no thyromegaly Lungs clear with no wheezing and good diaphragmatic motion Heart:  S1/S2 no murmur, no rub, gallop or click PMI normal Abdomen: benighn, BS positve, no tenderness, no AAA no bruit.  No HSM or HJR Distal pulses intact with no bruits No edema Neuro non-focal Skin warm and dry No muscular weakness  Wt Readings from Last 3 Encounters:    03/01/20 249 lb 6.4 oz (113.1 kg)  01/13/20 250 lb (113.4 kg)  12/16/19 252 lb (114.3 kg)      Studies/Labs Reviewed:   EKG:   03/01/20 SR RBBB PAC/PVCls   Recent Labs: 12/16/2019: ALT 21; BUN 20; Creat 1.03; Hemoglobin 17.8; Platelets 230; Potassium 4.5; Sodium 136; TSH 1.08   Lipid Panel    Component Value Date/Time   CHOL 156 12/16/2019 1140   TRIG 228 (  H) 12/16/2019 1140   HDL 43 12/16/2019 1140   CHOLHDL 3.6 12/16/2019 1140   VLDL 36.0 11/25/2018 1427   LDLCALC 81 12/16/2019 1140   LDLDIRECT 88.0 09/12/2016 1635    Additional studies/ records that were reviewed today include:  ETT/Myoview 04/2013 Conclusions PACs develop during recovery only. No evidence of exercise induced ischemia. Hypertensive response to exercise , otherwise normal stress test.   ASSESSMENT & PLAN:   SOB: Improved normal echo and exam doubt cardiac etiology    PAC;s : Benign in setting of non ischemic myovue and normal EF   HTN: Well controlled.  Continue current medications and low sodium Dash type diet.    RBBB: this has been stable over time. Yearly ECG   SVT s/p ablation: no recent palpitations .  DMT2: most recent HgA1c 7.0. Goal <6.5 now on jardiance  OSA: continue CPAP   Jenkins Rouge, MD

## 2020-02-29 ENCOUNTER — Other Ambulatory Visit: Payer: Self-pay | Admitting: Internal Medicine

## 2020-02-29 DIAGNOSIS — I10 Essential (primary) hypertension: Secondary | ICD-10-CM

## 2020-02-29 DIAGNOSIS — E1121 Type 2 diabetes mellitus with diabetic nephropathy: Secondary | ICD-10-CM

## 2020-03-01 ENCOUNTER — Ambulatory Visit: Payer: Medicare HMO | Admitting: Cardiovascular Disease

## 2020-03-01 ENCOUNTER — Other Ambulatory Visit: Payer: Self-pay

## 2020-03-01 ENCOUNTER — Encounter: Payer: Self-pay | Admitting: Cardiovascular Disease

## 2020-03-01 VITALS — BP 134/76 | HR 73 | Ht 71.0 in | Wt 249.4 lb

## 2020-03-01 DIAGNOSIS — I4510 Unspecified right bundle-branch block: Secondary | ICD-10-CM | POA: Diagnosis not present

## 2020-03-01 DIAGNOSIS — R0602 Shortness of breath: Secondary | ICD-10-CM

## 2020-03-01 DIAGNOSIS — I10 Essential (primary) hypertension: Secondary | ICD-10-CM

## 2020-03-01 NOTE — Patient Instructions (Signed)
Medication Instructions:  *If you need a refill on your cardiac medications before your next appointment, please call your pharmacy*  Lab Work: If you have labs (blood work) drawn today and your tests are completely normal, you will receive your results only by: Marland Kitchen MyChart Message (if you have MyChart) OR . A paper copy in the mail If you have any lab test that is abnormal or we need to change your treatment, we will call you to review the results.  Testing/Procedures: None ordered today.  Follow-Up: At [facility], you and your health needs are our priority.  As part of our continuing mission to provide you with exceptional heart care, we have created designated Provider Care Teams.  These Care Teams include your primary Cardiologist (physician) and Advanced Practice Providers (APPs -  Physician Assistants and Nurse Practitioners) who all work together to provide you with the care you need, when you need it.  We recommend signing up for the patient portal called "MyChart".  Sign up information is provided on this After Visit Summary.  MyChart is used to connect with patients for Virtual Visits (Telemedicine).  Patients are able to view lab/test results, encounter notes, upcoming appointments, etc.  Non-urgent messages can be sent to your provider as well.   To learn more about what you can do with MyChart, go to [email or web address].    Your next appointment:   1 year(s)  The format for your next appointment:   In Person  Provider:   You may see Dr. Johnsie Cancel or one of the following Advanced Practice Providers on your designated Care Team:    Truitt Merle, NP  Cecilie Kicks, NP  Kathyrn Drown, NP

## 2020-03-04 ENCOUNTER — Other Ambulatory Visit: Payer: Self-pay | Admitting: Cardiovascular Disease

## 2020-03-20 ENCOUNTER — Other Ambulatory Visit: Payer: Self-pay | Admitting: Internal Medicine

## 2020-03-20 DIAGNOSIS — E1121 Type 2 diabetes mellitus with diabetic nephropathy: Secondary | ICD-10-CM

## 2020-03-21 DIAGNOSIS — R69 Illness, unspecified: Secondary | ICD-10-CM | POA: Diagnosis not present

## 2020-03-29 ENCOUNTER — Telehealth: Payer: Self-pay | Admitting: Pharmacist

## 2020-03-29 NOTE — Progress Notes (Signed)
    Chronic Care Management Pharmacy Assistant   Name: Matthew Villanueva  MRN: [id number] DOB: January 12, 1936  Reason for Encounter: Initial Questions for Pharmacist visit on 03/30/2020   PCP : [name], MD  Allergies:   Allergies  Allergen Reactions  . Livalo [Pitavastatin] Other (See Comments)    Muscle aches  . Crestor [Rosuvastatin]     Muscle aches  . Metformin And Related Diarrhea  . Pravastatin     Muscle aches    Medications: Outpatient Encounter Medications as of 03/29/2020  Medication Sig  . blood glucose meter kit and supplies KIT Use to test blood sugar up to twice daily. DX: E11.21  . empagliflozin (JARDIANCE) 25 MG TABS tablet Take 1 tablet (25 mg total) by mouth daily.  Marland Kitchen gabapentin (NEURONTIN) 100 MG capsule Take 100 mg by mouth 3 (three) times daily.  Marland Kitchen glucose blood (COOL BLOOD GLUCOSE TEST STRIPS) test strip Use to test blood sugar up to twice daily. DX: E11.21  . Icosapent Ethyl (VASCEPA) 0.5 g CAPS Take 4 capsules by mouth 2 (two) times daily.  Marland Kitchen L-Methylfolate-Algae-B12-B6 (METANX) 3-90.314-2-35 MG CAPS Take 1 capsule by mouth 2 (two) times daily.  . Lancets (ONETOUCH ULTRASOFT) lancets Use to test blood sugar up to twice daily. DX: E11.21  . losartan-hydrochlorothiazide (HYZAAR) 100-25 MG tablet TAKE ONE TABLET BY MOUTH DAILY  . ONETOUCH VERIO test strip TEST BLOOD SUGAR TWO TIMES A DAY  . Semaglutide (RYBELSUS) 7 MG TABS Take 1 tablet by mouth daily.  . [DISCONTINUED] glucose blood (ONETOUCH VERIO) test strip 1 each by Other route 2 (two) times daily. Use BID  . [DISCONTINUED] losartan-hydrochlorothiazide (HYZAAR) 100-25 MG tablet TAKE ONE TABLET BY MOUTH DAILY  . [DISCONTINUED] ONETOUCH VERIO test strip USE TO TEST BLOOD SUGAR TWO TIMES A DAY   No facility-administered encounter medications on file as of 03/29/2020.    Current Diagnosis: Patient Active Problem List   Diagnosis Date Noted  . SCC (squamous cell carcinoma), hand, left  04/23/2019  . 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 Pure hyperglyceridemia 11/09/2015  . OSA (obstructive sleep apnea) 11/09/2015  . Statin intolerance 06/10/2014  . Chronic painful diabetic neuropathy ([place]) 12/24/2013  . Other male erectile dysfunction 12/24/2013  . 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  . Hyperlipidemia with target LDL less than 100 08/12/2013  . BPH associated with nocturia 08/12/2013  . Morbid obesity ([place]) 05/24/2011  . Routine health maintenance 05/24/2011  . HEARING LOSS 04/28/2009  . DM (diabetes mellitus), type 2 with renal complications ([place]) 01/75/1025  . DJD of shoulder 07/23/2007  . Gout 06/25/2007  . Essential hypertension 06/25/2007    Goals Addressed   None     Follow-Up:  Pharmacist Review   Patient was seen by Clinical Pharmacist Mendel Ryder on 03/30/20   Wendy Poet, [place]

## 2020-03-30 ENCOUNTER — Ambulatory Visit: Payer: Medicare HMO | Admitting: Pharmacist

## 2020-03-30 ENCOUNTER — Ambulatory Visit (INDEPENDENT_AMBULATORY_CARE_PROVIDER_SITE_OTHER): Payer: Medicare HMO | Admitting: Internal Medicine

## 2020-03-30 ENCOUNTER — Encounter: Payer: Self-pay | Admitting: Internal Medicine

## 2020-03-30 ENCOUNTER — Other Ambulatory Visit: Payer: Self-pay

## 2020-03-30 VITALS — BP 118/72 | HR 66 | Temp 98.2°F | Resp 16 | Ht 71.0 in | Wt 251.0 lb

## 2020-03-30 DIAGNOSIS — M160 Bilateral primary osteoarthritis of hip: Secondary | ICD-10-CM | POA: Diagnosis not present

## 2020-03-30 DIAGNOSIS — E785 Hyperlipidemia, unspecified: Secondary | ICD-10-CM

## 2020-03-30 DIAGNOSIS — I10 Essential (primary) hypertension: Secondary | ICD-10-CM

## 2020-03-30 DIAGNOSIS — E1121 Type 2 diabetes mellitus with diabetic nephropathy: Secondary | ICD-10-CM | POA: Diagnosis not present

## 2020-03-30 LAB — CBC WITH DIFFERENTIAL/PLATELET
Basophils Absolute: 0.1 10*3/uL (ref 0.0–0.1)
Basophils Relative: 0.7 % (ref 0.0–3.0)
Eosinophils Absolute: 0.2 10*3/uL (ref 0.0–0.7)
Eosinophils Relative: 1.9 % (ref 0.0–5.0)
HCT: 47.8 % (ref 39.0–52.0)
Hemoglobin: 16.3 g/dL (ref 13.0–17.0)
Lymphocytes Relative: 14.9 % (ref 12.0–46.0)
Lymphs Abs: 1.6 10*3/uL (ref 0.7–4.0)
MCHC: 34.1 g/dL (ref 30.0–36.0)
MCV: 87.2 fl (ref 78.0–100.0)
Monocytes Absolute: 0.9 10*3/uL (ref 0.1–1.0)
Monocytes Relative: 8.1 % (ref 3.0–12.0)
Neutro Abs: 8 10*3/uL — ABNORMAL HIGH (ref 1.4–7.7)
Neutrophils Relative %: 74.4 % (ref 43.0–77.0)
Platelets: 208 10*3/uL (ref 150.0–400.0)
RBC: 5.48 Mil/uL (ref 4.22–5.81)
RDW: 14.8 % (ref 11.5–15.5)
WBC: 10.7 10*3/uL — ABNORMAL HIGH (ref 4.0–10.5)

## 2020-03-30 LAB — BASIC METABOLIC PANEL
BUN: 15 mg/dL (ref 6–23)
CO2: 28 mEq/L (ref 19–32)
Calcium: 8.8 mg/dL (ref 8.4–10.5)
Chloride: 100 mEq/L (ref 96–112)
Creatinine, Ser: 0.92 mg/dL (ref 0.40–1.50)
GFR: 77.7 mL/min (ref >60.00)
Glucose, Bld: 125 mg/dL — ABNORMAL HIGH (ref 70–99)
Potassium: 3.5 mEq/L (ref 3.5–5.1)
Sodium: 136 mEq/L (ref 135–145)

## 2020-03-30 LAB — C-REACTIVE PROTEIN: CRP: <1 mg/dL (ref 0.5–20.0)

## 2020-03-30 LAB — HEMOGLOBIN A1C: Hgb A1c MFr Bld: 7 % — ABNORMAL HIGH (ref 4.6–6.5)

## 2020-03-30 LAB — CK: Total CK: 242 U/L — ABNORMAL HIGH (ref 7–232)

## 2020-03-30 MED ORDER — MELOXICAM 15 MG PO TABS: 15.0000 mg | ORAL_TABLET | Freq: Every day | ORAL | 1 refills | Status: DC

## 2020-03-30 NOTE — Patient Instructions (Signed)

## 2020-03-30 NOTE — Chronic Care Management (AMB) (Signed)
Chronic Care Management Pharmacy  Name: Matthew Villanueva  MRN: [id number] DOB: 1935-12-07   Chief Complaint/ HPI  Matthew Villanueva,  84 y.o. , male presents for their Initial CCM visit with the clinical pharmacist via telephone due to COVID-19 Pandemic.  PCP : [name], MD Patient Care Team: [name], MD as PCP - General (Internal Medicine) Chesley Mires, MD (Pulmonary Disease) Monna Fam, MD (Ophthalmology) Charlton Haws, [facility] as Pharmacist (Pharmacist)  Their chronic conditions include: Hypertension, Hyperlipidemia, Diabetes, Osteoarthritis, BPH and Gout   Office Visits: 01/13/20 Dr Ronnald Ramp OV: increase Rybelsus to 7 mg  12/16/19 Dr Ronnald Ramp OV: chronic f/u. Pt did not start Rybelsus d/t side effect concerns. Agreed to start it. Start Vascepa for high trig  Consult Visit: 03/01/20 Dr Johnsie Cancel (cardiology): f/u for SVT hx ablation, HTN, RBBB. No hx CAD. No med changes.  Allergies  Allergen Reactions  . Livalo [Pitavastatin] Other (See Comments)    Muscle aches  . Crestor [Rosuvastatin]     Muscle aches  . Metformin And Related Diarrhea  . Pravastatin     Muscle aches    Medications: Outpatient Encounter Medications as of 03/30/2020  Medication Sig  . blood glucose meter kit and supplies KIT Use to test blood sugar up to twice daily. DX: E11.21  . empagliflozin (JARDIANCE) 25 MG TABS tablet Take 1 tablet (25 mg total) by mouth daily.  Marland Kitchen glucose blood (COOL BLOOD GLUCOSE TEST STRIPS) test strip Use to test blood sugar up to twice daily. DX: E11.21  . Lancets (ONETOUCH ULTRASOFT) lancets Use to test blood sugar up to twice daily. DX: E11.21  . losartan-hydrochlorothiazide (HYZAAR) 100-25 MG tablet TAKE ONE TABLET BY MOUTH DAILY  . meloxicam (MOBIC) 15 MG tablet Take 1 tablet (15 mg total) by mouth daily.  Glory Rosebush VERIO test strip TEST BLOOD SUGAR TWO TIMES A DAY  . Semaglutide (RYBELSUS) 7 MG TABS Take 1 tablet by mouth daily.  Marland Kitchen gabapentin  (NEURONTIN) 100 MG capsule Take 100 mg by mouth 3 (three) times daily. (Patient not taking: Reported on 03/30/2020)  . [DISCONTINUED] glucose blood (ONETOUCH VERIO) test strip 1 each by Other route 2 (two) times daily. Use BID  . [DISCONTINUED] losartan-hydrochlorothiazide (HYZAAR) 100-25 MG tablet TAKE ONE TABLET BY MOUTH DAILY  . [DISCONTINUED] ONETOUCH VERIO test strip USE TO TEST BLOOD SUGAR TWO TIMES A DAY   No facility-administered encounter medications on file as of 03/30/2020.    Wt Readings from Last 3 Encounters:  03/30/20 251 lb (113.9 kg)  03/01/20 249 lb 6.4 oz (113.1 kg)  01/13/20 250 lb (113.4 kg)    Current Diagnosis/Assessment:  SDOH Interventions     Most Recent Value  SDOH Interventions  Financial Strain Interventions Other (Comment)  [PAP]      Goals Addressed            This Visit's Progress   . Pharmacy Care Plan       CARE PLAN ENTRY (see longitudinal plan of care for additional care plan information)  Current Barriers:  . Chronic Disease Management support, education, and care coordination needs related to Hypertension, Hyperlipidemia, and Diabetes   Hypertension BP Readings from Last 3 Encounters:  03/30/20 118/72  03/01/20 134/76  01/13/20 132/78 .  Pharmacist Clinical Goal(s): o Over the next 180 days, patient will work with PharmD and providers to maintain BP goal <130/80 . Current regimen:  o Losartan-HCTZ 100-25 mg daily . Interventions: o Discussed BP goals and benefits  of medications for prevention of heart attack / stroke . Patient self care activities - Over the next 180 days, patient will: o Check BP daily, document, and provide at future appointments o Ensure daily salt intake < 2300 mg/day  Hyperlipidemia Lab Results  Component Value Date/Time   LDLCALC 81 12/16/2019 11:40 AM   LDLDIRECT 88.0 09/12/2016 04:35 PM .  Pharmacist Clinical Goal(s): o Over the next 180 days, patient will work with PharmD and providers to maintain  LDL goal < 100 . Current regimen:  o No medications . Interventions: o Discussed cholesterol goals and benefits of medications for prevention of heart attack / stroke . Patient self care activities - Over the next 180 days, patient will: o Follow up with PCP regarding new cholesterol medication  Diabetes Lab Results  Component Value Date/Time   HGBA1C 8.3 (A) 12/16/2019 11:15 AM   HGBA1C 7.9 (A) 11/25/2018 02:00 PM   HGBA1C 7.4 (H) 12/03/2017 09:26 AM   HGBA1C 7.5 (H) 05/17/2017 09:24 AM .  Pharmacist Clinical Goal(s): o Over the next 180 days, patient will work with PharmD and providers to achieve A1c goal <8% . Current regimen:  o Jardiance 25 mg daily o Rybelsus 7 mg daily . Interventions: o Discussed importance of maintaining sugars at goal to prevent complications of diabetes including kidney damage, retinal damage, and cardiovascular disease o Pursue patient assistance for Rybelsus and Jardiance . Patient self care activities - Over the next 180 days, patient will: o Check blood sugar once daily, document, and provide at future appointments o Contact provider with any episodes of hypoglycemia o Provided income document and sign patient section of assistance applications  Medication management . Pharmacist Clinical Goal(s): o Over the next 180 days, patient will work with PharmD and providers to maintain optimal medication adherence . [facility] . Interventions o Comprehensive medication review performed. o Continue current medication management strategy . Patient self care activities - Over the next 180 days, patient will: o Focus on medication adherence by pill box o Take medications as prescribed o Report any questions or concerns to PharmD and/or provider(s)  Initial goal documentation       Hypertension   BP goal is:  <130/80  Office blood pressures are  BP Readings from Last 3 Encounters:  03/30/20 118/72  03/01/20 134/76  01/13/20  132/78   Lab Results  Component Value Date   CREATININE 1.03 12/16/2019   BUN 20 12/16/2019   GFR 67.83 11/25/2018   GFRNONAA 68 12/16/2019   GFRAA 79 12/16/2019   NA 136 12/16/2019   K 4.5 12/16/2019   CALCIUM 9.9 12/16/2019   CO2 26 12/16/2019   Patient checks BP at home infrequently Patient home BP readings are ranging: n/a  Patient has failed these meds in the past: n/a Patient is currently controlled on the following medications:  . Losartan-HCTZ 100-25 mg daily  We discussed diet and exercise extensively ; BP goals; benefits of medications; pt denies side effects; kidney function appropriate for continued use  Plan  Continue current medications and control with diet and exercise     Hyperlipidemia   LDL goal < 100  Last lipids Lab Results  Component Value Date   CHOL 156 12/16/2019   HDL 43 12/16/2019   LDLCALC 81 12/16/2019   LDLDIRECT 88.0 09/12/2016   TRIG 228 (H) 12/16/2019   CHOLHDL 3.6 12/16/2019   Hepatic Function Latest Ref Rng & Units 12/16/2019 07/30/2019 11/25/2018  Total Protein 6.1 - 8.1  g/dL 7.2 7.5 7.7  Albumin 3.6 - 4.6 g/dL - 4.3 4.2  AST 10 - 35 U/L $Remo'17 28 16  'jCpHq$ ALT 9 - 46 U/L 21 CANCELED 24  Alk Phosphatase 39 - 117 IU/L - 31(L) 21(L)  Total Bilirubin 0.2 - 1.2 mg/dL 0.5 0.3 0.6  Bilirubin, Direct 0.0 - 0.2 mg/dL 0.1 - 0.0    The ASCVD Risk score Mikey Bussing DC Jr., et al., 2013) failed to calculate for the following reasons:   The 2013 ASCVD risk score is only valid for ages 5 to 44   Patient has failed these meds in past: rosuvastatin, Vascepa, Livalo, ezetimibe Patient is currently controlled on the following medications:  . No medication  We discussed:  diet and exercise extensively; Cholesterol goals; LDL is most recently at goal however triglycerides are high; pt could not afford Vascepa and he thinks it worsened leg cramps;   Plan  Continue control with diet and exercise  Diabetes   A1c goal <8%  Recent Relevant Labs: Lab  Results  Component Value Date/Time   HGBA1C 8.3 (A) 12/16/2019 11:15 AM   HGBA1C 7.9 (A) 11/25/2018 02:00 PM   HGBA1C 7.4 (H) 12/03/2017 09:26 AM   HGBA1C 7.5 (H) 05/17/2017 09:24 AM   GFR 67.83 11/25/2018 02:27 PM   GFR 73.90 12/03/2017 09:26 AM   MICROALBUR 11.1 12/16/2019 11:40 AM   MICROALBUR 5.8 (H) 11/25/2018 02:27 PM    Last diabetic Eye exam:  Lab Results  Component Value Date/Time   HMDIABEYEEXA No Retinopathy 12/02/2019 12:00 AM    Last diabetic Foot exam:  Lab Results  Component Value Date/Time   HMDIABFOOTEX done 11/08/2017 12:00 AM    Checking BG: Daily  Recent FBG Readings: 110-126, 150 after eating out night before  Patient has failed these meds in past: n/a Patient is currently uncontrolled on the following medications: . Jardiance 25 mg daily . Rybelsus 7 mg daily   We discussed: diet and exercise extensively; A1c goal; benefits of medications; pt has not had A1c rechecked since starting Rybelsus; pt denies side effects; both medications are expensive in [place]; pt should qualify for PAP based on income  Plan  Continue current medications  Pursue PAP for Jardiance ([employer]) and Rybelsus [employer]) for 2022  Neuropathy   Patient has failed these meds in past: n/a Patient is currently controlled on the following medications:  . Gabapentin 100 mg TID - not taking  We discussed:  Pt is not taking mediation and denies neuropathy  Plan  Continue to monitor  Osteoarthritis   Patient has failed these meds in past: n/a Patient is currently uncontrolled on the following medications:  Marland Kitchen Meloxicam 15 mg daily  We discussed:  Pt reports worsening muscle/joint aches, he was just prescribed meloxicam but has not tried it yet. Advised to take with food  Plan  Continue current medications  Health Maintenance   Patient is currently controlled on the following medications:  Marland Kitchen Multivitamin   We discussed:  Patient is satisfied with current regimen  and denies issues  Plan  Continue current medications  Medication Management   Pt uses [facility] for all medications Uses pill box? Yes Pt endorses 100% compliance  We discussed: Current pharmacy is preferred with insurance plan and patient is satisfied with pharmacy services  Plan  Continue current medication management strategy    Follow up: 6 month phone visit  Charlene Brooke, PharmD, BCACP Clinical Pharmacist [facility] at [facility] [phone]

## 2020-03-30 NOTE — Patient Instructions (Addendum)
Visit Information  Phone number for Pharmacist: [phone]  Thank you for meeting with me to discuss your medications! I look forward to working with you to achieve your health care goals. Below is a summary of what we talked about during the visit:  Goals Addressed            This Visit's Progress   . Pharmacy Care Plan       CARE PLAN ENTRY (see longitudinal plan of care for additional care plan information)  Current Barriers:  . Chronic Disease Management support, education, and care coordination needs related to Hypertension, Hyperlipidemia, and Diabetes   Hypertension BP Readings from Last 3 Encounters:  03/30/20 118/72  03/01/20 134/76  01/13/20 132/78 .  Pharmacist Clinical Goal(s): o Over the next 180 days, patient will work with PharmD and providers to maintain BP goal <130/80 . Current regimen:  o Losartan-HCTZ 100-25 mg daily . Interventions: o Discussed BP goals and benefits of medications for prevention of heart attack / stroke . Patient self care activities - Over the next 180 days, patient will: o Check BP daily, document, and provide at future appointments o Ensure daily salt intake < 2300 mg/day  Hyperlipidemia Lab Results  Component Value Date/Time   LDLCALC 81 12/16/2019 11:40 AM   LDLDIRECT 88.0 09/12/2016 04:35 PM .  Pharmacist Clinical Goal(s): o Over the next 180 days, patient will work with PharmD and providers to maintain LDL goal < 100 . Current regimen:  o No medications . Interventions: o Discussed cholesterol goals and benefits of medications for prevention of heart attack / stroke . Patient self care activities - Over the next 180 days, patient will: o Follow up with PCP regarding new cholesterol medication  Diabetes Lab Results  Component Value Date/Time   HGBA1C 8.3 (A) 12/16/2019 11:15 AM   HGBA1C 7.9 (A) 11/25/2018 02:00 PM   HGBA1C 7.4 (H) 12/03/2017 09:26 AM   HGBA1C 7.5 (H) 05/17/2017 09:24 AM .  Pharmacist Clinical  Goal(s): o Over the next 180 days, patient will work with PharmD and providers to achieve A1c goal <8% . Current regimen:  o Jardiance 25 mg daily o Rybelsus 7 mg daily . Interventions: o Discussed importance of maintaining sugars at goal to prevent complications of diabetes including kidney damage, retinal damage, and cardiovascular disease o Pursue patient assistance for Rybelsus and Jardiance . Patient self care activities - Over the next 180 days, patient will: o Check blood sugar once daily, document, and provide at future appointments o Contact provider with any episodes of hypoglycemia o Provided income document and sign patient section of assistance applications  Medication management . Pharmacist Clinical Goal(s): o Over the next 180 days, patient will work with PharmD and providers to maintain optimal medication adherence . [facility] . Interventions o Comprehensive medication review performed. o Continue current medication management strategy . Patient self care activities - Over the next 180 days, patient will: o Focus on medication adherence by pill box o Take medications as prescribed o Report any questions or concerns to PharmD and/or provider(s)  Initial goal documentation      Matthew Villanueva was given information about Chronic Care Management services today including:  1. CCM service includes personalized support from designated clinical staff supervised by his physician, including individualized plan of care and coordination with other care providers 2. 24/7 contact phone numbers for assistance for urgent and routine care needs. 3. Standard insurance, coinsurance, copays and deductibles apply for chronic care management  only during months in which we provide at least 20 minutes of these services. Most insurances cover these services at 100%, however patients may be responsible for any copay, coinsurance and/or deductible if applicable. This service  may help you avoid the need for more expensive face-to-face services. 4. Only one practitioner may furnish and bill the service in a calendar month. 5. The patient may stop CCM services at any time (effective at the end of the month) by phone call to the office staff.  Patient agreed to services and verbal consent obtained.   The patient verbalized understanding of instructions, educational materials, and care plan provided today and agreed to receive a mailed copy of patient instructions, educational materials, and care plan.  Telephone follow up appointment with pharmacy team member scheduled for: 6 months  Charlene Brooke, PharmD, BCACP Clinical Pharmacist [facility] [phone]  Tips for Eating Away From Home If You Have Diabetes Controlling your blood sugar (glucose) levels can be challenging when you do not prepare your own meals. The following tips can help you manage your diabetes when you eat away from home. If you have questions or if you need help, work with your health care provider or diet and nutrition specialist (dietitian). Planning ahead Plan ahead if you know you will be eating away from home:  Try to eat your meals and snacks at about the same time each day. If you know your meal is going to be later than normal, make sure you have a small snack. Being very hungry can cause you to make unhealthy food choices.  Make a list of restaurants near you that offer healthy choices. If a restaurant has a carry-out menu, take the menu home and plan what you will order ahead of time.  Look up the restaurant you want to eat at online. Many chain and fast-food restaurants list nutritional information online. Use this information to choose the healthiest options and to calculate how many carbohydrates will be in your meal.  Use a carbohydrate-counting book or mobile app to look up the carbohydrate content and serving size of the foods you want to eat. Free  foods A "free food" is any food or drink that has less than 5 grams of carbohydrates and less than 20 calories per serving. These food are high in fiber and nutrients and low in calories, carbohydrates, and fats. Free foods include:  Non-starchy vegetables, such as carrots, broccoli, celery, lettuce, or green beans.  Non-sugar drinks, such as water, unsweetened coffee, or unsweetened tea.  Low-calorie salad dressings.  Sugar-free gelatin. Starting meals with a salad full of vegetables is a healthy choice that includes a lot of free foods. Avoid high-calorie salad toppings like bacon, cheese, and high-fat dressings. Ask for your salad dressing to be served on the side so that you dip your fork in the dressing and then in the salad. This allows you to control how much dressing you eat and still get the flavor with every bite. Choices to control carbohydrates   Ask your server to take away the bread basket or chips from your table.  Choose light yogurt or [place] yogurt instead of non-fat sweetened yogurt.  Order fresh fruit. A salad bar often offers fresh fruit choices. Avoid canned fruit because it is usually packed in sugar or syrup.  Order a salad, and ask for dressing on the side.  Ask for substitutes. For example, if your meal comes with french fries, ask for a side  salad or steamed veggies instead. If a meal comes with fried chicken, ask for grilled chicken instead. Beverages  Choose drinks that are low in calories and sugar, such as: ? Water. ? Unsweetened tea or coffee. ? Lowfat milk.  Avoid the following drinks: ? Alcoholic beverages. ? Regular (not diet) sodas. Other tips  If you take insulin, wait to take your insulin once your food arrives to your table. This will ensure that your insulin and your food are timed correctly.  Become familiar with serving sizes and learn to recognize how many servings are in a portion. Restaurant portions are typically two to three times  larger than what you really need.  Ask your server for a to-go box at the beginning of the meal. When your food comes, leave the amount you should have on your plate, and put the rest in the to-go box so that you are not tempted to eat too much.  Consider splitting an entree with someone and ordering a side salad.  Avoid buffets. They are typically too tempting and result in overeating. Where to find more information  American Diabetes Association: www.diabetes.org  American Association of Diabetes Educators: www.diabeteseducator.org Summary  Plan ahead when eating away from home.  Try to eat your meals and snacks at about the same time each day. If you know your meal is going to be later than normal, make sure you have a small snack. Being very hungry can cause you to make unhealthy food choices.  Ask for substitutes. For example, if your meal comes with french fries, ask for a side salad or steamed veggies instead. If a meal comes with fried chicken, ask for grilled chicken instead.  Ask for a to-go box when you order your meal. Divide your meal before you start eating. This information is not intended to replace advice given to you by your health care provider. Make sure you discuss any questions you have with your health care provider. Document Revised: 08/09/2016 Document Reviewed: 08/09/2016 Elsevier Patient Education  [place].

## 2020-03-30 NOTE — Progress Notes (Signed)
Subjective:  Patient ID: Matthew Villanueva, male    DOB: [birth year]  Age: 84 y.o. MRN: [id number]  CC: Hyperlipidemia, Osteoarthritis, and Hypertension  This visit occurred during the SARS-CoV-2 public health emergency.  Safety protocols were in place, including screening questions prior to the visit, additional usage of staff PPE, and extensive cleaning of exam room while observing appropriate contact time as indicated for disinfecting solutions.    HPI Matthew Villanueva presents for f/up - He complains of a 75-month history of pain in his hips and lower extremities.  He tells me he recently saw an orthopedist and was told that he has arthritis in his hips.  He takes 600 mg of ibuprofen in the morning and that helps.  He would like to change his NSAID to something that he can take once a day.  He denies claudication.  Outpatient Medications Prior to Visit  Medication Sig Dispense Refill  . blood glucose meter kit and supplies KIT Use to test blood sugar up to twice daily. 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 empagliflozin (JARDIANCE) 25 MG TABS tablet Take 1 tablet (25 mg total) by mouth daily. 90 tablet 1  . gabapentin (NEURONTIN) 100 MG capsule Take 100 mg by mouth 3 (three) times daily. (Patient not taking: Reported on 03/30/2020)    . glucose blood (COOL BLOOD GLUCOSE TEST STRIPS) test strip Use to test blood sugar up to twice daily. DX: E11.21 100 each 12  . Lancets (ONETOUCH ULTRASOFT) lancets Use to test blood sugar up to twice daily. DX: E11.21 100 each 12  . losartan-hydrochlorothiazide (HYZAAR) 100-25 MG tablet TAKE ONE TABLET BY MOUTH DAILY 90 tablet 1  . ONETOUCH VERIO test strip TEST BLOOD SUGAR TWO TIMES A DAY 150 strip 5  . Semaglutide (RYBELSUS) 7 MG TABS Take 1 tablet by mouth daily. 90 tablet 1  . Icosapent Ethyl (VASCEPA) 0.5 g CAPS Take 4 capsules by mouth 2 (two) times daily. 240 capsule 5  . L-Methylfolate-Algae-B12-B6 (METANX) 3-90.314-2-35 MG CAPS Take 1 capsule by mouth 2  (two) times daily. 180 capsule 1   No facility-administered medications prior to visit.    ROS Review of Systems  Constitutional: Negative.  Negative for appetite change, diaphoresis, fatigue and unexpected weight change.  HENT: Negative.   Eyes: Negative for visual disturbance.  Respiratory: Negative for chest tightness, shortness of breath and wheezing.   Cardiovascular: Negative for chest pain, palpitations and leg swelling.  Gastrointestinal: Negative for abdominal pain, constipation, diarrhea, nausea and vomiting.  Endocrine: Negative.   Genitourinary: Negative.  Negative for difficulty urinating.  Musculoskeletal: Positive for arthralgias. Negative for back pain and myalgias.  Skin: Negative for color change.  Neurological: Negative.  Negative for dizziness, weakness and numbness.  Hematological: Negative for adenopathy. Does not bruise/bleed easily.  Psychiatric/Behavioral: Negative.     Objective:  BP 118/72   Pulse 66   Temp 98.2 F (36.8 C) (Oral)   Resp 16   Ht $R'5\' 11"'ce$  (1.803 m)   Wt 251 lb (113.9 kg)   SpO2 95%   BMI 35.01 kg/m   BP Readings from Last 3 Encounters:  03/30/20 118/72  03/01/20 134/76  01/13/20 132/78    Wt Readings from Last 3 Encounters:  03/30/20 251 lb (113.9 kg)  03/01/20 249 lb 6.4 oz (113.1 kg)  01/13/20 250 lb (113.4 kg)    Physical Exam Vitals reviewed.  HENT:     Nose: Nose normal.     Mouth/Throat:  Mouth: Mucous membranes are moist.  Eyes:     General: No scleral icterus.    Conjunctiva/sclera: Conjunctivae normal.  Cardiovascular:     Rate and Rhythm: Normal rate and regular rhythm.     Heart sounds: No murmur heard.   Pulmonary:     Effort: Pulmonary effort is normal.     Breath sounds: No stridor. No wheezing, rhonchi or rales.  Abdominal:     General: Abdomen is protuberant. Bowel sounds are normal. There is no distension.     Palpations: Abdomen is soft. There is no hepatomegaly, splenomegaly or mass.      Tenderness: There is no abdominal tenderness.  Musculoskeletal:        General: No swelling or tenderness. Normal range of motion.     Cervical back: Neck supple.     Right lower leg: No edema.     Left lower leg: No edema.  Lymphadenopathy:     Cervical: No cervical adenopathy.  Skin:    General: Skin is warm and dry.  Neurological:     General: No focal deficit present.     Mental Status: He is alert.  Psychiatric:        Mood and Affect: Mood normal.        Behavior: Behavior normal.     Lab Results  Component Value Date   WBC 10.7 (H) 03/30/2020   HGB 16.3 03/30/2020   HCT 47.8 03/30/2020   PLT 208.0 03/30/2020   GLUCOSE 125 (H) 03/30/2020   CHOL 156 12/16/2019   TRIG 228 (H) 12/16/2019   HDL 43 12/16/2019   LDLDIRECT 88.0 09/12/2016   LDLCALC 81 12/16/2019   ALT 21 12/16/2019   AST 17 12/16/2019   NA 136 03/30/2020   K 3.5 03/30/2020   CL 100 03/30/2020   CREATININE 0.92 03/30/2020   BUN 15 03/30/2020   CO2 28 03/30/2020   TSH 1.08 12/16/2019   PSA 2.86 12/03/2017   HGBA1C 7.0 (H) 03/30/2020   MICROALBUR 11.1 12/16/2019    DG ABD ACUTE 2+V W 1V CHEST  Result Date: 11/25/2018 CLINICAL DATA:  Right flank pain for 5 days. History of hypertension and diabetes. EXAM: DG ABDOMEN ACUTE W/ 1V CHEST COMPARISON:  03/06/2016 FINDINGS: Small round calcification projects in the left mid abdomen, possibly within the lower pole of the left kidney. No other evidence of a renal stone. No evidence of a ureteral stone. Soft tissues otherwise unremarkable. Normal bowel gas pattern.  No free air. Cardiac silhouette is normal size. No mediastinal or hilar masses. No convincing adenopathy. Prominent lung markings in the bases. Lungs otherwise clear. Elevated right hemidiaphragm. IMPRESSION: 1. No acute findings within the abdomen or pelvis. 2. Probable small nonobstructing stone in the lower pole the right kidney. No evidence of a ureteral stone. 3. No active cardiopulmonary disease.  Electronically Signed   By: Lajean Manes M.D.   On: 11/25/2018 16:51    Assessment & Plan:   Cassandra was seen today for hyperlipidemia, osteoarthritis and hypertension.  Diagnoses and all orders for this visit:  Primary osteoarthritis of both hips- His CK and CRP are normal.  There is no evidence of myopathy or inflammatory arthritis.  Will treat with meloxicam. -     C-reactive protein; Future -     CK; Future -     meloxicam (MOBIC) 15 MG tablet; Take 1 tablet (15 mg total) by mouth daily. -     C-reactive protein -     CK  Essential hypertension- His blood pressure is adequately well controlled. -     CBC with Differential/Platelet; Future -     Basic metabolic panel; Future -     CBC with Differential/Platelet -     Basic metabolic panel  Hyperlipidemia with target LDL less than 100- He is not willing to take a statin. -     CK; Future -     CK  Type 2 diabetes mellitus with diabetic nephropathy, without long-term current use of insulin ([place])- His A1c is at 7.0%.  His blood sugars are adequately well controlled. -     Hemoglobin A1c; Future -     HM Diabetes Foot Exam -     Hemoglobin A1c   I have discontinued Dutch C. Winders's Metanx and Vascepa. I am also having him start on meloxicam. Additionally, I am having him maintain his onetouch ultrasoft, glucose blood, blood glucose meter kit and supplies, empagliflozin, gabapentin, Rybelsus, losartan-hydrochlorothiazide, and OneTouch Verio.  Meds ordered this encounter  Medications  . meloxicam (MOBIC) 15 MG tablet    Sig: Take 1 tablet (15 mg total) by mouth daily.    Dispense:  90 tablet    Refill:  1     Follow-up: Return in about 4 months (around 07/28/2020).  Scarlette Calico, MD

## 2020-04-02 NOTE — Addendum Note (Signed)
Addended by: Hinda Kehr on: 04/02/2020 08:49 AM   Modules accepted: Orders

## 2020-04-06 ENCOUNTER — Ambulatory Visit: Payer: Medicare HMO | Admitting: Internal Medicine

## 2020-04-07 ENCOUNTER — Other Ambulatory Visit: Payer: Self-pay

## 2020-04-07 ENCOUNTER — Encounter: Payer: Self-pay | Admitting: Internal Medicine

## 2020-04-07 ENCOUNTER — Ambulatory Visit (INDEPENDENT_AMBULATORY_CARE_PROVIDER_SITE_OTHER): Payer: Medicare HMO | Admitting: Internal Medicine

## 2020-04-07 ENCOUNTER — Other Ambulatory Visit: Payer: Self-pay | Admitting: Internal Medicine

## 2020-04-07 VITALS — BP 138/86 | HR 65 | Temp 97.7°F | Ht 71.0 in | Wt 251.0 lb

## 2020-04-07 DIAGNOSIS — E1140 Type 2 diabetes mellitus with diabetic neuropathy, unspecified: Secondary | ICD-10-CM

## 2020-04-07 DIAGNOSIS — M791 Myalgia, unspecified site: Secondary | ICD-10-CM | POA: Insufficient documentation

## 2020-04-07 DIAGNOSIS — L820 Inflamed seborrheic keratosis: Secondary | ICD-10-CM | POA: Diagnosis not present

## 2020-04-07 DIAGNOSIS — M19011 Primary osteoarthritis, right shoulder: Secondary | ICD-10-CM | POA: Diagnosis not present

## 2020-04-07 DIAGNOSIS — M19012 Primary osteoarthritis, left shoulder: Secondary | ICD-10-CM

## 2020-04-07 DIAGNOSIS — L989 Disorder of the skin and subcutaneous tissue, unspecified: Secondary | ICD-10-CM | POA: Insufficient documentation

## 2020-04-07 MED ORDER — TRAMADOL HCL 50 MG PO TABS: 50.0000 mg | ORAL_TABLET | Freq: Four times a day (QID) | ORAL | 3 refills | Status: DC | PRN

## 2020-04-07 NOTE — Progress Notes (Signed)
Subjective:  Patient ID: Matthew Villanueva, male    DOB: 1935/08/14  Age: 84 y.o. MRN: [id number]  CC: Follow-up     HPI Matthew Villanueva presents for f/up -  1.  He continues to complain of muscle aches in his thighs and buttocks.  He has not gotten any symptom relief with meloxicam.  He notices most of the discomfort when he is at rest.  He denies lower extremity claudication.  His recent CK level was very mildly elevated.  His CRP was normal.  He has stopped taking Metanx and icosapent ethyl esters and the discontinuation of those 2 meds has not helped.  2.  He complains of an enlarging skin lesion on his left anterior chest over the last month.  It does not bleed or bother him.  Outpatient Medications Prior to Visit  Medication Sig Dispense Refill   blood glucose meter kit and supplies KIT Use to test blood sugar up to twice daily. DX: E11.21 1 each 0   empagliflozin (JARDIANCE) 25 MG TABS tablet Take 1 tablet (25 mg total) by mouth daily. 90 tablet 1   gabapentin (NEURONTIN) 100 MG capsule Take 100 mg by mouth 3 (three) times daily.      glucose blood (COOL BLOOD GLUCOSE TEST STRIPS) test strip Use to test blood sugar up to twice daily. DX: E11.21 100 each 12   Lancets (ONETOUCH ULTRASOFT) lancets Use to test blood sugar up to twice daily. DX: E11.21 100 each 12   losartan-hydrochlorothiazide (HYZAAR) 100-25 MG tablet TAKE ONE TABLET BY MOUTH DAILY 90 tablet 1   ONETOUCH VERIO test strip TEST BLOOD SUGAR TWO TIMES A DAY 150 strip 5   Semaglutide (RYBELSUS) 7 MG TABS Take 1 tablet by mouth daily. 90 tablet 1   meloxicam (MOBIC) 15 MG tablet Take 1 tablet (15 mg total) by mouth daily. 90 tablet 1   No facility-administered medications prior to visit.    ROS Review of Systems  Constitutional: Negative.  Negative for diaphoresis and fatigue.  HENT: Negative.   Eyes: Negative for visual disturbance.  Respiratory: Negative for cough, chest tightness, shortness of  breath and wheezing.   Cardiovascular: Negative for chest pain, palpitations and leg swelling.  Gastrointestinal: Negative for abdominal pain, diarrhea, nausea and vomiting.  Endocrine: Negative.   Genitourinary: Negative.  Negative for difficulty urinating.  Musculoskeletal: Positive for myalgias.  Skin: Negative.  Negative for color change.  Neurological: Negative.  Negative for dizziness, weakness, light-headedness, numbness and headaches.  Hematological: Negative for adenopathy. Does not bruise/bleed easily.  Psychiatric/Behavioral: Negative.     Objective:  BP 138/86    Pulse 65    Temp 97.7 F (36.5 C) (Oral)    Ht $R'5\' 11"'LB$  (1.803 m)    Wt 251 lb (113.9 kg)    SpO2 95%    BMI 35.01 kg/m   BP Readings from Last 3 Encounters:  04/07/20 138/86  03/30/20 118/72  03/01/20 134/76    Wt Readings from Last 3 Encounters:  04/07/20 251 lb (113.9 kg)  03/30/20 251 lb (113.9 kg)  03/01/20 249 lb 6.4 oz (113.1 kg)    Physical Exam Vitals reviewed.  Constitutional:      Appearance: Normal appearance.  HENT:     Nose: Nose normal.     Mouth/Throat:     Mouth: Mucous membranes are moist.  Eyes:     General: No scleral icterus.    Conjunctiva/sclera: Conjunctivae normal.  Cardiovascular:     Rate and  Rhythm: Normal rate and regular rhythm.     Heart sounds: No murmur heard.   Pulmonary:     Effort: Pulmonary effort is normal.     Breath sounds: No stridor. No wheezing, rhonchi or rales.  Chest:    Musculoskeletal:     Cervical back: Neck supple.  Lymphadenopathy:     Cervical: No cervical adenopathy.  Neurological:     Mental Status: He is alert.     Lab Results  Component Value Date   WBC 10.7 (H) 03/30/2020   HGB 16.3 03/30/2020   HCT 47.8 03/30/2020   PLT 208.0 03/30/2020   GLUCOSE 125 (H) 03/30/2020   CHOL 156 12/16/2019   TRIG 228 (H) 12/16/2019   HDL 43 12/16/2019   LDLDIRECT 88.0 09/12/2016   LDLCALC 81 12/16/2019   ALT 21 12/16/2019   AST 17  12/16/2019   NA 136 03/30/2020   K 3.5 03/30/2020   CL 100 03/30/2020   CREATININE 0.92 03/30/2020   BUN 15 03/30/2020   CO2 28 03/30/2020   TSH 1.08 12/16/2019   PSA 2.86 12/03/2017   HGBA1C 7.0 (H) 03/30/2020   MICROALBUR 11.1 12/16/2019    DG ABD ACUTE 2+V W 1V CHEST  Result Date: 11/25/2018 CLINICAL DATA:  Right flank pain for 5 days. History of hypertension and diabetes. EXAM: DG ABDOMEN ACUTE W/ 1V CHEST COMPARISON:  03/06/2016 FINDINGS: Small round calcification projects in the left mid abdomen, possibly within the lower pole of the left kidney. No other evidence of a renal stone. No evidence of a ureteral stone. Soft tissues otherwise unremarkable. Normal bowel gas pattern.  No free air. Cardiac silhouette is normal size. No mediastinal or hilar masses. No convincing adenopathy. Prominent lung markings in the bases. Lungs otherwise clear. Elevated right hemidiaphragm. IMPRESSION: 1. No acute findings within the abdomen or pelvis. 2. Probable small nonobstructing stone in the lower pole the right kidney. No evidence of a ureteral stone. 3. No active cardiopulmonary disease. Electronically Signed   By: Matthew Villanueva M.D.   On: 11/25/2018 16:51   After informed verbal consent was obtained. Using Betadine for cleansing and 2% Lidocaine with epinephrine for anesthetic ( 2 cc's used). With sterile technique the lesion was shaved to obtain a biopsy specimen of the lesion. Hemostasis was obtained by pressure and silver nitrate The specimen is labeled and sent to pathology for evaluation. The procedure was well tolerated without complications.  Assessment & Plan:   Matthew Villanueva was seen today for wound check.  Diagnoses and all orders for this visit:  Skin lesion of chest wall- Specimen sent for pathological confirmation.  My concerns are squamous cell carcinoma, melanoma, keratoacanthoma, or basal cell carcinoma. -     Dermatology pathology; Future -     Dermatology pathology  Chronic painful  diabetic neuropathy (HCC) -     traMADol (ULTRAM) 50 MG tablet; Take 1 tablet (50 mg total) by mouth every 6 (six) hours as needed for moderate pain.  Primary osteoarthritis of both shoulders -     traMADol (ULTRAM) 50 MG tablet; Take 1 tablet (50 mg total) by mouth every 6 (six) hours as needed for moderate pain.  Myalgia- Will try to control the pain with tramadol.  I have asked him to see rheumatology to be evaluated for myopathy. -     traMADol (ULTRAM) 50 MG tablet; Take 1 tablet (50 mg total) by mouth every 6 (six) hours as needed for moderate pain. -     Ambulatory  referral to Rheumatology   I have discontinued Alexande C. Klatt's meloxicam. I am also having him start on traMADol. Additionally, I am having him maintain his onetouch ultrasoft, glucose blood, blood glucose meter kit and supplies, empagliflozin, gabapentin, Rybelsus, losartan-hydrochlorothiazide, and OneTouch Verio.  Meds ordered this encounter  Medications   traMADol (ULTRAM) 50 MG tablet    Sig: Take 1 tablet (50 mg total) by mouth every 6 (six) hours as needed for moderate pain.    Dispense:  90 tablet    Refill:  3     Follow-up: Return in about 1 week (around 04/14/2020).  Scarlette Calico, MD

## 2020-04-07 NOTE — Patient Instructions (Signed)
Wound Care, Adult Taking care of your wound properly can help to prevent pain, infection, and scarring. It can also help your wound to heal more quickly. How to care for your wound Wound care      Follow instructions from your health care provider about how to take care of your wound. Make sure you: ? Wash your hands with soap and water before you change the bandage (dressing). If soap and water are not available, use hand sanitizer. ? Change your dressing as told by your health care provider. ? Leave stitches (sutures), skin glue, or adhesive strips in place. These skin closures may need to stay in place for 2 weeks or longer. If adhesive strip edges start to loosen and curl up, you may trim the loose edges. Do not remove adhesive strips completely unless your health care provider tells you to do that.  Check your wound area every day for signs of infection. Check for: ? Redness, swelling, or pain. ? Fluid or blood. ? Warmth. ? Pus or a bad smell.  Ask your health care provider if you should clean the wound with mild soap and water. Doing this may include: ? Using a clean towel to pat the wound dry after cleaning it. Do not rub or scrub the wound. ? Applying a cream or ointment. Do this only as told by your health care provider. ? Covering the incision with a clean dressing.  Ask your health care provider when you can leave the wound uncovered.  Keep the dressing dry until your health care provider says it can be removed. Do not take baths, swim, use a hot tub, or do anything that would put the wound underwater until your health care provider approves. Ask your health care provider if you can take showers. You may only be allowed to take sponge baths. Medicines   If you were prescribed an antibiotic medicine, cream, or ointment, take or use the antibiotic as told by your health care provider. Do not stop taking or using the antibiotic even if your condition improves.  Take  over-the-counter and prescription medicines only as told by your health care provider. If you were prescribed pain medicine, take it 30 or more minutes before you do any wound care or as told by your health care provider. General instructions  Return to your normal activities as told by your health care provider. Ask your health care provider what activities are safe.  Do not scratch or pick at the wound.  Do not use any products that contain nicotine or tobacco, such as cigarettes and e-cigarettes. These may delay wound healing. If you need help quitting, ask your health care provider.  Keep all follow-up visits as told by your health care provider. This is important.  Eat a diet that includes protein, vitamin A, vitamin C, and other nutrient-rich foods to help the wound heal. ? Foods rich in protein include meat, dairy, beans, nuts, and other sources. ? Foods rich in vitamin A include carrots and dark green, leafy vegetables. ? Foods rich in vitamin C include citrus, tomatoes, and other fruits and vegetables. ? Nutrient-rich foods have protein, carbohydrates, fat, vitamins, or minerals. Eat a variety of healthy foods including vegetables, fruits, and whole grains. Contact a health care provider if:  You received a tetanus shot and you have swelling, severe pain, redness, or bleeding at the injection site.  Your pain is not controlled with medicine.  You have redness, swelling, or pain around the wound.    You have fluid or blood coming from the wound.  Your wound feels warm to the touch.  You have pus or a bad smell coming from the wound.  You have a fever or chills.  You are nauseous or you vomit.  You are dizzy. Get help right away if:  You have a red streak going away from your wound.  The edges of the wound open up and separate.  Your wound is bleeding, and the bleeding does not stop with gentle pressure.  You have a rash.  You faint.  You have trouble  breathing. Summary  Always wash your hands with soap and water before changing your bandage (dressing).  To help with healing, eat foods that are rich in protein, vitamin A, vitamin C, and other nutrients.  Check your wound every day for signs of infection. Contact your health care provider if you suspect that your wound is infected. This information is not intended to replace advice given to you by your health care provider. Make sure you discuss any questions you have with your health care provider. Document Revised: 08/19/2018 Document Reviewed: 11/16/2015 [employer] Patient Education  2020 [employer]  

## 2020-04-12 DIAGNOSIS — M5416 Radiculopathy, lumbar region: Secondary | ICD-10-CM | POA: Diagnosis not present

## 2020-04-27 DIAGNOSIS — M5416 Radiculopathy, lumbar region: Secondary | ICD-10-CM | POA: Diagnosis not present

## 2020-05-18 ENCOUNTER — Encounter: Payer: Self-pay | Admitting: Internal Medicine

## 2020-05-18 ENCOUNTER — Ambulatory Visit: Payer: Medicare HMO | Admitting: Internal Medicine

## 2020-05-18 ENCOUNTER — Other Ambulatory Visit: Payer: Self-pay

## 2020-05-18 VITALS — BP 100/52 | HR 81 | Ht 69.0 in | Wt 250.0 lb

## 2020-05-18 DIAGNOSIS — M791 Myalgia, unspecified site: Secondary | ICD-10-CM | POA: Diagnosis not present

## 2020-05-18 DIAGNOSIS — L409 Psoriasis, unspecified: Secondary | ICD-10-CM

## 2020-05-18 NOTE — Progress Notes (Signed)
Office Visit Note  Patient: Matthew Villanueva             Date of Birth: [birth year]           MRN: [id number]             PCP: Etta Grandchild, MD Referring: Etta Grandchild, MD Visit Date: 05/18/2020 Occupation: [employer]  Subjective:   History of Present Illness: Matthew Villanueva is a 85 y.o. male here for evaluation of myalgias and elevated CK level. He experienced about 2 months of pain mostly in the backs of the legs proximally. Before this started he recalls getting his COVID vaccine booster and also starting a statin for hyperlipidemia within the preceding 1-2 months. He says this felt clearly different from sciatica and peripheral neuropathy pain, which he suffers chronically. Evaluation with PCP office demonstrated very slightly above normal leukocyte count and CK. Currently his symptoms are doing better without much pain or difficulty moving today. He has a chronic history of plaque psoriasis worst around his sacrum this is active to a typical extent.  LAbs reviewed 03/2020 WBC 10.7 CK 242 CRP <1.0   Activities of Daily Living:  Patient reports morning stiffness for 0 minutes.   Patient Denies nocturnal pain.  Difficulty dressing/grooming: Denies Difficulty climbing stairs: Denies Difficulty getting out of chair: Reports Difficulty using hands for taps, buttons, cutlery, and/or writing: Reports  Review of Systems  Constitutional: Negative for fatigue.  HENT: Positive for mouth dryness. Negative for mouth sores and nose dryness.   Eyes: Negative for pain, itching, visual disturbance and dryness.  Respiratory: Negative for cough, hemoptysis, shortness of breath and difficulty breathing.   Cardiovascular: Positive for swelling in legs/feet. Negative for chest pain and palpitations.  Gastrointestinal: Positive for constipation and diarrhea. Negative for abdominal pain and blood in stool.  Endocrine: Negative for increased urination.  Genitourinary: Negative  for painful urination.  Musculoskeletal: Negative for arthralgias, joint pain, joint swelling, myalgias, muscle weakness, morning stiffness, muscle tenderness and myalgias.  Skin: Negative for color change, rash and redness.  Allergic/Immunologic: Negative for susceptible to infections.  Neurological: Negative for dizziness, numbness, headaches, memory loss and weakness.  Hematological: Negative for swollen glands.  Psychiatric/Behavioral: Negative for confusion and sleep disturbance.    PMFS History:  Patient Active Problem List   Diagnosis Date Noted  . Psoriasis 05/18/2020  . Skin lesion of chest wall 04/07/2020  . Myalgia 04/07/2020  . Primary osteoarthritis of both hips 03/30/2020  . SCC (squamous cell carcinoma), hand, left 04/23/2019  . 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 Pure hyperglyceridemia 11/09/2015  . OSA (obstructive sleep apnea) 11/09/2015  . Statin intolerance 06/10/2014  . Chronic painful diabetic neuropathy (HCC) 12/24/2013  . Other male erectile dysfunction 12/24/2013  . 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  . Hyperlipidemia with target LDL less than 100 08/12/2013  . BPH associated with nocturia 08/12/2013  . Morbid obesity (HCC) 05/24/2011  . Routine health maintenance 05/24/2011  . HEARING LOSS 04/28/2009  . 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 DJD of shoulder 07/23/2007  . Gout 06/25/2007  . Essential hypertension 06/25/2007    Past Medical History:  Diagnosis Date  . Arthritis   . Diabetes mellitus    NO INSULIN  . Dyspnea   . Gout   . History of colonic polyps   . HTN (hypertension)   . Localized osteoarthrosis not specified whether primary or secondary,  hand   . OSA (obstructive sleep apnea)   . Other specified cardiac dysrhythmias(427.89) 2004   ABLATION DUE TO TACHYCARDIA  . Right bundle branch block     Family History  Problem Relation Age of Onset  . Cancer Father         throat and tongue  . Heart disease Mother   . Lung cancer Sister   . Diabetes Maternal Uncle   . Heart attack Maternal Grandmother   . Heart disease Maternal Grandmother        CAD/MI  . Heart disease Maternal Grandfather   . Heart disease Maternal Aunt        CAD   Past Surgical History:  Procedure Laterality Date  . CARDIAC ELECTROPHYSIOLOGY STUDY AND ABLATION  2004   ablation for SVT with a chronic right bundle branch block   . HEMORRHOID SURGERY  1960  . PILONIDAL CYST DRAINAGE     Social History   Social History Narrative   [employer], [place]   Single   Work: self employed...Nurse, children's   I-ADLs   End-of-Life: Yes-CPR, Yes-short-term Mechanical Ventilation, but no prolonged ventilation. Is willing to undergo dialysis, prolonged tub feeding.   Pt states former smoker. 1986. 2ppd. Started age 48   Immunization History  Administered Date(s) Administered  . Fluad Quad(high Dose 65+) 02/17/2019  . H1N1 04/21/2008  . Influenza Whole 04/01/2007, 02/21/2010, 02/13/2011, 03/18/2012  . Influenza, High Dose Seasonal PF 03/06/2016, 01/10/2017, 02/01/2018  . Influenza,inj,Quad PF,6+ Mos 01/20/2013, 05/04/2015, 01/13/2020  . PFIZER SARS-COV-2 Vaccination 07/06/2019, 08/03/2019, 02/13/2020  . Pneumococcal Conjugate-13 08/12/2013  . Pneumococcal Polysaccharide-23 03/07/2004, 05/17/2017  . Td 07/04/2004  . Tdap 05/04/2015  . Zoster 04/29/2010  . Zoster Recombinat (Shingrix) 03/08/2019     Objective: Vital Signs: BP (!) 100/52 (BP Location: Right Arm, Patient Position: Sitting, Cuff Size: Normal)   Pulse 81   Ht 5\' 9"  (1.753 m)   Wt 250 lb (113.4 kg)   BMI 36.92 kg/m    Physical Exam HENT:     Right Ear: External ear normal.     Left Ear: External ear normal.     Mouth/Throat:     Mouth: Mucous membranes are moist.     Pharynx: Oropharynx is clear.  Eyes:     Conjunctiva/sclera: Conjunctivae normal.  Cardiovascular:     Rate and Rhythm: Normal rate and  regular rhythm.  Pulmonary:     Effort: Pulmonary effort is normal.     Breath sounds: Normal breath sounds.  Skin:    General: Skin is warm and dry.     Comments: Scalp shows erythematous rash over occiput, large plaque over sacrum, small plaque on elbow  Neurological:     Mental Status: He is alert.     Comments: Strength 5/5 throughout, bilateral symmetric knee jerk reflexes     Musculoskeletal Exam:  Neck full range of motion Shoulder, elbow, wrist, fingers no tenderness or swelling Normal hip internal and external rotation Knees b/l patellofemoral crepitus, ankles full range of motion no tenderness or swelling   Investigation: No additional findings.  Imaging: No results found.  Recent Labs: Lab Results  Component Value Date   WBC 10.7 (H) 03/30/2020   HGB 16.3 03/30/2020   PLT 208.0 03/30/2020   NA 136 03/30/2020   K 3.5 03/30/2020   CL 100 03/30/2020   CO2 28 03/30/2020   GLUCOSE 125 (H) 03/30/2020   BUN 15 03/30/2020   CREATININE 0.92 03/30/2020   BILITOT 0.5 12/16/2019  ALKPHOS 31 (L) 07/30/2019   AST 17 12/16/2019   ALT 21 12/16/2019   PROT 7.2 12/16/2019   ALBUMIN 4.3 07/30/2019   CALCIUM 8.8 03/30/2020   GFRAA 79 12/16/2019    Speciality Comments: No specialty comments available.  Procedures:  No procedures performed Allergies: Livalo [pitavastatin], Crestor [rosuvastatin], Metformin and related, and Pravastatin   Assessment / Plan:     Visit Diagnoses: Myalgia - Plan: CK, Aldolase  Unclear exact cause of muscle pain and elevated CK although could represent statin induced myopathy. Symptoms are now improved so low suspicion for development of IMNM would not test further unless CK remains elevated or worsening or if his symptoms are getting worse again.  Psoriasis  Active skin disease on scalp, elbow, back but no inflammatory joint disease seen. Muscle inflammation not typical systemic manifestation of active psoriatic disease so would not  recommend change in therapy for current complaints.  Orders: Orders Placed This Encounter  Procedures  . CK  . Aldolase   No orders of the defined types were placed in this encounter.   Follow-Up Instructions: Return if symptoms worsen or fail to improve.   Collier Salina, MD  Note - This record has been created using [employer].  Chart creation errors have been sought, but may not always  have been located. Such creation errors do not reflect on  the standard of medical care.

## 2020-05-18 NOTE — Patient Instructions (Signed)
I do not see evidence of muscle or joint inflammation on examination at this time.  Statin medications can be associated with muscle pain that typically improves after stopping treatment.  People who have psoriasis can develop inflammation of the joints although most do not. I do not see evidence of this currently but if new symptoms occur we could reassess but just as needed.  Trigger finger can be treated with stretching the finger regularly, use of a splint especially overnight, or if more problematic can use steroid injection to treat.   Psoriasis Psoriasis is a long-term (chronic) skin condition. It occurs because your immune system causes skin cells to form too quickly. As a result, too many skin cells grow and create raised, red patches (plaques) that often look silvery on your skin. Plaques may show up anywhere on your body. They can be any size or shape. Symptoms of this condition range from mild to very severe. Psoriasis cannot be passed from one person to another (is not contagious). Sometimes, the symptoms go away and then come back again. What are the causes? The cause of psoriasis is not known, but certain factors can make the condition worse. These include:  Damage or trauma to the skin, such as cuts, scrapes, sunburn, and dryness.  Not enough exposure to sunlight.  Certain medicines.  Alcohol.  Tobacco use.  Stress.  Infections caused by bacteria or viruses. What increases the risk? You are more likely to develop this condition if you:  Have a family history of psoriasis.  Are obese.  Are 38-72 years old.  Are taking certain medicines. What are the signs or symptoms? There are different types of psoriasis. You can have more than one type of psoriasis during your life. The types are:  Plaque. This is the most common.  Guttate. This is also called eruptive psoriasis.  Inverse.  Pustular.  Erythrodermic.  Sebopsoriasis.  Psoriatic arthritis. Each  type of psoriasis has different symptoms.  Plaque psoriasis symptoms include red, raised plaques with a silvery-white coating (scale). These plaques may be itchy. Your nails may be pitted and crumbly or fall off.  Guttate psoriasis symptoms include small red spots that often show up on your trunk, arms, and legs. These spots may develop after you have been sick, especially with strep throat.  Inverse psoriasis symptoms include plaques in your underarm area, under your breasts, or on your genitals, groin, or buttocks.  Pustular psoriasis symptoms include pus-filled bumps that are painful, red, and swollen on the palms of your hands or the soles of your feet. You also may feel exhausted, feverish, weak, or have no appetite.  Erythrodermic psoriasis symptoms include bright red skin that may look burned. You may have a fast heartbeat and a body temperature that is too high or too low. You may be itchy or in pain.  Sebopsoriasis symptoms include red plaques that have a greasy coating, and are often on your scalp, forehead, and face.  Psoriatic arthritis causes swollen, painful joints along with scaly skin plaques. How is this diagnosed? This condition is diagnosed based on your symptoms, family history, and a physical exam.  You may also be referred to a health care provider who specializes in skin diseases (dermatologist).  Your health care provider may remove a tissue sample (biopsy) for testing. How is this treated? There is no cure for this condition, but treatment can help manage it. Goals of treatment include:  Helping your skin heal.  Reducing itching and inflammation.  Slowing  the growth of new skin cells.  Helping your immune system respond better to your skin. Treatment varies, depending on the severity of your condition. This condition may be treated by:  Creams or ointments to help with symptoms.  Ultraviolet ray exposure (light therapy or phototherapy). This may include  natural sunlight or light therapy in a medical office.  Medicines (systemic therapy). These medicines can help your body better manage skin cell turnover and inflammation. Medicines may be given in the form of pills or injections. They may be used along with light therapy or ointments. You may also get antibiotic medicines if you have an infection. Follow these instructions at home: Skin Care  Moisturize your skin as needed. Only use moisturizers that have been approved by your health care provider.  Apply cool, wet cloths (cold compresses) to the affected areas.  Do not use a hot tub or take hot showers. Take lukewarm showers and baths.  Do not scratch your skin. Lifestyle   Do not use any products that contain nicotine or tobacco, such as cigarettes, e-cigarettes, and chewing tobacco. If you need help quitting, ask your health care provider.  Use techniques for stress reduction, such as meditation or yoga.  Maintain a healthy weight. Follow instructions from your health care provider for weight control. These may include dietary restrictions.  Get safe exposure to the sun as told by your health care provider. This may include spending regular intervals of time outdoors in sunlight. Do not get sunburned.  Consider joining a psoriasis support group. Medicines  Take or use over-the-counter and prescription medicines only as told by your health care provider.  If you were prescribed an antibiotic medicine, take it as told by your health care provider. Do not stop using the antibiotic even if you start to feel better. Alcohol use  Limit how much you use: ? 0-1 drink a day for women. ? 0-2 drinks a day for men.  Be aware of how much alcohol is in your drink. In the [place] one drink equals one 12 oz bottle of beer (355 mL), one 5 oz glass of wine (148 mL), or one 1 oz glass of hard liquor (44 mL). General instructions  Keep a journal to help track what triggers an outbreak. Try to  avoid any triggers.  See a counselor if feelings of sadness, frustration, and hopelessness about your condition are interfering with your work and relationships.  Keep all follow-up visits as told by your health care provider. This is important. Contact a health care provider if:  You have a fever.  Your pain gets worse.  You have increasing redness or warmth in the affected areas.  You have new or worsening pain or stiffness in your joints.  Your nails start to break easily or pull away from the nail bed.  You feel depressed. Summary  Psoriasis is a long-term (chronic) skin condition. Patches (plaques) may show up anywhere on your body.  There is no cure for this condition, but treatment can help manage it. Treatment varies, depending on the severity of your condition.  Keep a journal to track what triggers an outbreak. Try to avoid any triggers.  Take or use over-the-counter and prescription medicines only as told by your health care provider.  Keep all follow-up visits as told by your health care provider. This is important.     Trigger Finger  Trigger finger, also called stenosing tenosynovitis,  is a condition that causes a finger to get stuck in  a bent position. Each finger has a tendon, which is a tough, cord-like tissue that connects muscle to bone, and each tendon passes through a tunnel of tissue called a tendon sheath. To move your finger, your tendon needs to glide freely through the sheath. Trigger finger happens when the tendon or the sheath thickens, making it difficult to move your finger. Trigger finger can affect any finger or a thumb. It may affect more than one finger. Mild cases may clear up with rest and medicine. Severe cases require more treatment. What are the causes? Trigger finger is caused by a thickened finger tendon or tendon sheath. The cause of this thickening is not known. What increases the risk? The following factors may make you more likely  to develop this condition:  Doing activities that require a strong grip.  Having rheumatoid arthritis, gout, or diabetes.  Being 69-73 years old.  Being male. What are the signs or symptoms? Symptoms of this condition include:  Pain when bending or straightening your finger.  Tenderness or swelling where your finger attaches to the palm of your hand.  A lump in the palm of your hand or on the inside of your finger.  Hearing a noise like a pop or a snap when you try to straighten your finger.  Feeling a catching or locking sensation when you try to straighten your finger.  Being unable to straighten your finger. How is this diagnosed? This condition is diagnosed based on your symptoms and a physical exam. How is this treated? This condition may be treated by:  Resting your finger and avoiding activities that make symptoms worse.  Wearing a finger splint to keep your finger extended.  Taking NSAIDs, such as ibuprofen, to relieve pain and swelling.  Doing gentle exercises to stretch the finger as told by your health care provider.  Having medicine that reduces swelling and inflammation (steroids) injected into the tendon sheath. Injections may need to be repeated.  Having surgery to open the tendon sheath. This may be done if other treatments do not work and you cannot straighten your finger. You may need physical therapy after surgery. Follow these instructions at home: If you have a splint:  Wear the splint as told by your health care provider. Remove it only as told by your health care provider.  Loosen it if your fingers tingle, become numb, or turn cold and blue.  Keep it clean.  If the splint is not waterproof: ? Do not let it get wet. ? Cover it with a watertight covering when you take a bath or shower. Managing pain, stiffness, and swelling     If directed, apply heat to the affected area as often as told by your health care provider. Use the heat source  that your health care provider recommends, such as a moist heat pack or a heating pad.  Place a towel between your skin and the heat source.  Leave the heat on for 20-30 minutes.  Remove the heat if your skin turns bright red. This is especially important if you are unable to feel pain, heat, or cold. You may have a greater risk of getting burned. If directed, put ice on the painful area. To do this:  If you have a removable splint, remove it as told by your health care provider.  Put ice in a plastic bag.  Place a towel between your skin and the bag or between your splint and the bag.  Leave the ice on for  20 minutes, 2-3 times a day.  Activity  Rest your finger as told by your health care provider. Avoid activities that make the pain worse.  Return to your normal activities as told by your health care provider. Ask your health care provider what activities are safe for you.  Do exercises as told by your health care provider.  Ask your health care provider when it is safe to drive if you have a splint on your hand. General instructions  Take over-the-counter and prescription medicines only as told by your health care provider.  Keep all follow-up visits as told by your health care provider. This is important. Contact a health care provider if:  Your symptoms are not improving with home care. Summary  Trigger finger, also called stenosing tenosynovitis, causes your finger to get stuck in a bent position. This can make it difficult and painful to straighten your finger.  This condition develops when a finger tendon or tendon sheath thickens.  Treatment may include resting your finger, wearing a splint, and taking medicines.  In severe cases, surgery to open the tendon sheath may be needed.

## 2020-05-19 LAB — ALDOLASE: Aldolase: 5.1 U/L (ref <=8.1)

## 2020-05-19 LAB — CK: Total CK: 149 U/L (ref 44–196)

## 2020-05-23 ENCOUNTER — Other Ambulatory Visit: Payer: Self-pay | Admitting: Internal Medicine

## 2020-05-23 DIAGNOSIS — E1121 Type 2 diabetes mellitus with diabetic nephropathy: Secondary | ICD-10-CM

## 2020-05-23 DIAGNOSIS — Z794 Long term (current) use of insulin: Secondary | ICD-10-CM

## 2020-05-26 NOTE — Progress Notes (Signed)
Lab tests show no evidence of muscle inflammation or muscle damage. This was previously elevated 1 month ago so may have been a transient problem of muscle inflammation in response to something that is not ongoing. Based on this no specific rheumatology testing or treatment needed unless problems change again.

## 2020-06-14 ENCOUNTER — Telehealth: Payer: Self-pay | Admitting: Internal Medicine

## 2020-06-14 MED ORDER — BLOOD GLUCOSE MONITOR KIT: PACK | 0 refills | Status: AC

## 2020-06-14 NOTE — Telephone Encounter (Signed)
1.Medication Requested: blood glucose meter kit and supplies KIT    2. Pharmacy (Name, Street, [place]): [facility] [place], [place] [address]  3. On Med List: yes   4. Last Visit with PCP: 11.24.21  5. Next visit date with PCP: n/a    Agent: Please be advised that RX refills may take up to 3 business days. We ask that you follow-up with your pharmacy.

## 2020-07-20 DIAGNOSIS — H903 Sensorineural hearing loss, bilateral: Secondary | ICD-10-CM | POA: Diagnosis not present

## 2020-07-20 DIAGNOSIS — Z822 Family history of deafness and hearing loss: Secondary | ICD-10-CM | POA: Diagnosis not present

## 2020-07-20 DIAGNOSIS — E119 Type 2 diabetes mellitus without complications: Secondary | ICD-10-CM | POA: Diagnosis not present

## 2020-07-21 ENCOUNTER — Telehealth: Payer: Self-pay | Admitting: Pharmacist

## 2020-07-21 NOTE — Progress Notes (Signed)
Chronic Care Management Pharmacy Assistant   Name: Matthew Villanueva  MRN: [id number] DOB: 1936/02/08   Reason for Encounter: Hypertension Adherence Call   Conditions to be addressed/monitored: HTN   Recent office visits:  04/07/20 Dr. Ronnald Ramp   Recent consult visits:  05/18/20 Dr. Benjamine Mola Rheumatology   [facility] visits:  None in previous 6 months  Medications: Outpatient Encounter Medications as of 07/21/2020  Medication Sig  . Ascorbic Acid (VITAMIN C PO) Take by mouth.  . blood glucose meter kit and supplies KIT Use to test blood sugar up to twice daily. DX: E11.21  . Coenzyme Q10 (COQ10 PO) Take by mouth.  . gabapentin (NEURONTIN) 100 MG capsule Take 100 mg by mouth 3 (three) times daily.  (Patient not taking: Reported on 05/18/2020)  . glucose blood (COOL BLOOD GLUCOSE TEST STRIPS) test strip Use to test blood sugar up to twice daily. DX: E11.21  . JARDIANCE 25 MG TABS tablet TAKE ONE TABLET BY MOUTH DAILY  . Lancets (ONETOUCH ULTRASOFT) lancets Use to test blood sugar up to twice daily. DX: E11.21  . losartan-hydrochlorothiazide (HYZAAR) 100-25 MG tablet TAKE ONE TABLET BY MOUTH DAILY  . Multiple Vitamin (MULTIVITAMIN) tablet Take 1 tablet by mouth daily.  . Omega-3 Fatty Acids (FISH OIL PO) Take by mouth.  Glory Rosebush VERIO test strip TEST BLOOD SUGAR TWO TIMES A DAY  . Semaglutide (RYBELSUS) 7 MG TABS Take 1 tablet by mouth daily.  . traMADol (ULTRAM) 50 MG tablet Take 1 tablet (50 mg total) by mouth every 6 (six) hours as needed for moderate pain. (Patient not taking: Reported on 05/18/2020)   No facility-administered encounter medications on file as of 07/21/2020.     Star Rating Drugs: Losartan -hctz   Reviewed chart prior to disease state call. Spoke with patient regarding BP  Recent Office Vitals: BP Readings from Last 3 Encounters:  05/18/20 (!) 100/52  04/07/20 138/86  03/30/20 118/72   Pulse Readings from Last 3 Encounters:  05/18/20 81  04/07/20 65   03/30/20 66    Wt Readings from Last 3 Encounters:  05/18/20 250 lb (113.4 kg)  04/07/20 251 lb (113.9 kg)  03/30/20 251 lb (113.9 kg)     Kidney Function Lab Results  Component Value Date/Time   CREATININE 0.92 03/30/2020 10:15 AM   CREATININE 1.03 12/16/2019 11:40 AM   CREATININE 1.10 07/30/2019 05:54 PM   GFR 77.70 03/30/2020 10:15 AM   GFRNONAA 68 12/16/2019 11:40 AM   GFRAA 79 12/16/2019 11:40 AM    BMP Latest Ref Rng & Units 03/30/2020 12/16/2019 07/30/2019  Glucose 70 - 99 mg/dL 125(H) 175(H) 149(H)  BUN 6 - 23 mg/dL $Remove'15 20 15  '[id number]$ Creatinine 0.40 - 1.50 mg/dL 0.92 1.03 1.10  BUN/Creat Ratio 6 - 22 (calc) - NOT APPLICABLE 14  Sodium 568 - 145 mEq/L 136 136 CANCELED  Potassium 3.5 - 5.1 mEq/L 3.5 4.5 CANCELED  Chloride 96 - 112 mEq/L 100 100 CANCELED  CO2 19 - 32 mEq/L 28 26 19(L)  Calcium 8.4 - 10.5 mg/dL 8.8 9.9 9.7    . Current antihypertensive regimen: Patient is taking losartan-hctz  . How often are you checking your Blood Pressure? The patient states that he does not check blood pressure daily states that he has no problems with blood pressure  . Current home BP readings: The patient has not blood pressure readings  . What recent interventions/DTPs have been made by any provider to improve Blood Pressure control since last CPP  Visit: None  . Any recent hospitalizations or ED visits since last visit with CPP? The patient has not had any hospital or ED visits  . What diet changes have been made to improve Blood Pressure Control? The patient states his diet has not changed  . What exercise is being done to improve your Blood Pressure Control? The patient states there has been no changes in exercise   Adherence Review: Is the patient currently on ACE/ARB medication? Yes, losartan hctz Does the patient have >5 day gap between last estimated fill dates? No   Wendy Poet, [place]   Time spent:20

## 2020-07-22 ENCOUNTER — Other Ambulatory Visit: Payer: Self-pay | Admitting: Internal Medicine

## 2020-07-22 DIAGNOSIS — E1121 Type 2 diabetes mellitus with diabetic nephropathy: Secondary | ICD-10-CM

## 2020-08-16 ENCOUNTER — Other Ambulatory Visit: Payer: Self-pay

## 2020-08-16 ENCOUNTER — Encounter: Payer: Self-pay | Admitting: Internal Medicine

## 2020-08-16 ENCOUNTER — Ambulatory Visit (INDEPENDENT_AMBULATORY_CARE_PROVIDER_SITE_OTHER): Payer: Medicare HMO | Admitting: Internal Medicine

## 2020-08-16 VITALS — BP 144/66 | HR 77 | Temp 97.8°F | Resp 18 | Ht 69.0 in | Wt 253.1 lb

## 2020-08-16 DIAGNOSIS — W19XXXA Unspecified fall, initial encounter: Secondary | ICD-10-CM | POA: Diagnosis not present

## 2020-08-16 DIAGNOSIS — M25461 Effusion, right knee: Secondary | ICD-10-CM | POA: Diagnosis not present

## 2020-08-16 DIAGNOSIS — Z23 Encounter for immunization: Secondary | ICD-10-CM | POA: Diagnosis not present

## 2020-08-16 DIAGNOSIS — S80811A Abrasion, right lower leg, initial encounter: Secondary | ICD-10-CM

## 2020-08-16 DIAGNOSIS — M25561 Pain in right knee: Secondary | ICD-10-CM | POA: Diagnosis not present

## 2020-08-16 MED ORDER — MUPIROCIN CALCIUM 2 % EX CREA: 1.0000 "application " | TOPICAL_CREAM | Freq: Two times a day (BID) | CUTANEOUS | 0 refills | Status: DC

## 2020-08-16 MED ORDER — CEPHALEXIN 500 MG PO CAPS: 500.0000 mg | ORAL_CAPSULE | Freq: Four times a day (QID) | ORAL | 0 refills | Status: DC

## 2020-08-16 NOTE — Progress Notes (Signed)
Subjective:    Patient ID: Matthew Villanueva, male    DOB: 10/12/1935, 85 y.o.   MRN: [id number]  DOS:  08/16/2020 Type of visit - description: Acute The patient had a fall a week ago, the fall was mechanical, no syncope. He landed on his right knee and skinned it.  He is here today because the area seems more red. Has been using peroxide and a OTC antibiotic ointment.  Denies fever chills Question of a discharge.    Review of Systems   Past Medical History:  Diagnosis Date  . Arthritis   . Diabetes mellitus    NO INSULIN  . Dyspnea   . Gout   . History of colonic polyps   . HTN (hypertension)   . Localized osteoarthrosis not specified whether primary or secondary, hand   . OSA (obstructive sleep apnea)   . Other specified cardiac dysrhythmias(427.89) 2004   ABLATION DUE TO TACHYCARDIA  . Right bundle branch block     Past Surgical History:  Procedure Laterality Date  . CARDIAC ELECTROPHYSIOLOGY STUDY AND ABLATION  2004   ablation for SVT with a chronic right bundle branch block   . [place]  . PILONIDAL CYST DRAINAGE      Social History   Socioeconomic History  . Marital status: Single    Spouse name: Not on file  . Number of children: Not on file  . Years of education: 27  . Highest education level: Not on file  Occupational History  . Occupation: self employed, Chemical engineer: SELF-EMPLOYED  Tobacco Use  . Smoking status: Former Smoker    Packs/day: 2.00    Years: 30.00    Pack years: 60.00    Types: Cigarettes    Quit date: 05/15/1984    Years since quitting: 36.2  . Smokeless tobacco: Never Used  Vaping Use  . Vaping Use: Never used  Substance and Sexual Activity  . Alcohol use: Yes    Comment: Rarely  . Drug use: No  . Sexual activity: Not Currently  Other Topics Concern  . Not on file  Social History Narrative   HSG, [place]   Single   Work: self employed...Recruitment consultant   I-ADLs   End-of-Life:  Yes-CPR, Yes-short-term Mechanical Ventilation, but no prolonged ventilation. Is willing to undergo dialysis, prolonged tub feeding.   Pt states former smoker. 1986. 2ppd. Started age 71   Social Determinants of Health   Financial Resource Strain: Medium Risk  . Difficulty of Paying Living Expenses: Somewhat hard  Food Insecurity: Not on file  Transportation Needs: Not on file  Physical Activity: Not on file  Stress: Not on file  Social Connections: Not on file  Intimate Partner Violence: Not on file      Allergies as of 08/16/2020      Reactions   Livalo [pitavastatin] Other (See Comments)   Muscle aches   Crestor [rosuvastatin]    Muscle aches   Metformin And Related Diarrhea   Pravastatin    Muscle aches      Medication List       Accurate as of August 16, 2020  3:21 PM. If you have any questions, ask your nurse or doctor.        blood glucose meter kit and supplies Kit Use to test blood sugar up to twice daily. DX: E11.21   cephALEXin 500 MG capsule Commonly known as: KEFLEX Take 1 capsule (500 mg total) by mouth  4 (four) times daily. Started by: Kathlene November, MD   COQ10 PO Take by mouth.   FISH OIL PO Take by mouth.   gabapentin 100 MG capsule Commonly known as: NEURONTIN Take 100 mg by mouth 3 (three) times daily.   glucose blood test strip Commonly known as: Cool Blood Glucose Test Strips Use to test blood sugar up to twice daily. DX: E11.21   OneTouch Verio test strip Generic drug: glucose blood TEST BLOOD SUGAR TWO TIMES A DAY   Jardiance 25 MG Tabs tablet Generic drug: empagliflozin TAKE ONE TABLET BY MOUTH DAILY   losartan-hydrochlorothiazide 100-25 MG tablet Commonly known as: HYZAAR TAKE ONE TABLET BY MOUTH DAILY   multivitamin tablet Take 1 tablet by mouth daily.   mupirocin cream 2 % Commonly known as: Bactroban Apply 1 application topically 2 (two) times daily. Started by: Kathlene November, MD   onetouch ultrasoft lancets Use to test blood  sugar up to twice daily. DX: E11.21   Rybelsus 7 MG Tabs Generic drug: Semaglutide TAKE ONE TABLET BY MOUTH DAILY   traMADol 50 MG tablet Commonly known as: ULTRAM Take 1 tablet (50 mg total) by mouth every 6 (six) hours as needed for moderate pain.   VITAMIN C PO Take by mouth.          Objective:   Physical Exam BP (!) 144/66 (BP Location: Left Arm, Patient Position: Sitting, Cuff Size: Normal)   Pulse 77   Temp 97.8 F (36.6 C) (Oral)   Resp 18   Ht _0  (1.753 m)   Wt 253 lb 2 oz (114.8 kg)   SpO2 95%   BMI 37.38 kg/m  General:   Well developed, NAD, BMI noted. HEENT:  Normocephalic . Face symmetric, atraumatic MSK: Left knee normal Right knee: Range of motion is normal, no deformities, lateral pressure shows a stable joint. He does have superficial scrape with redness around it, see picture.  No abscess that I can tell, no obvious discharge today. Neurologic:  alert & oriented X3.  Speech normal, gait appropriate for age and unassisted Psych--  Cognition and judgment appear intact.  Cooperative with normal attention span and concentration.  Behavior appropriate. No anxious or depressed appearing.        Assessment     85 year old gentleman, PMH includes HTN, DM, OSA, neuropathy, gout, obesity, presents with:  Fall initial encounter, knee contusion, abrasion with cellulitis right knee: Do not believe the patient has a major bone problem at this point. He is developing cellulitis. Plan: Keep the area clean and dry, Td booster, Keflex, DC OTC antibiotic ointment and start Bactroban. He is very aware he needs to let us know if he is not improving, he is diabetic, at risk for complications.  He verbalized understanding.    This visit occurred during the SARS-CoV-2 public health emergency.  Safety protocols were in place, including screening questions prior to the visit, additional usage of staff PPE, and extensive cleaning of exam room while observing  appropriate contact time as indicated for disinfecting solutions.

## 2020-08-16 NOTE — Patient Instructions (Signed)
Keep the area clean and dry  Stop using the over-the-counter antibiotic ointment and to start using Bactroban, prescription sent, use it twice a day.  Take the antibiotics name Keflex for 1 week.  Definitely call if you have fever chills, the red area is not getting better gradually or if you get worse.

## 2020-09-02 ENCOUNTER — Other Ambulatory Visit: Payer: Self-pay | Admitting: Internal Medicine

## 2020-09-02 DIAGNOSIS — E1121 Type 2 diabetes mellitus with diabetic nephropathy: Secondary | ICD-10-CM

## 2020-09-02 DIAGNOSIS — I10 Essential (primary) hypertension: Secondary | ICD-10-CM

## 2020-09-14 ENCOUNTER — Other Ambulatory Visit: Payer: Self-pay

## 2020-09-14 ENCOUNTER — Ambulatory Visit (INDEPENDENT_AMBULATORY_CARE_PROVIDER_SITE_OTHER): Payer: Medicare HMO | Admitting: Pharmacist

## 2020-09-14 DIAGNOSIS — E785 Hyperlipidemia, unspecified: Secondary | ICD-10-CM | POA: Diagnosis not present

## 2020-09-14 DIAGNOSIS — E1121 Type 2 diabetes mellitus with diabetic nephropathy: Secondary | ICD-10-CM

## 2020-09-14 DIAGNOSIS — I10 Essential (primary) hypertension: Secondary | ICD-10-CM

## 2020-09-14 DIAGNOSIS — M791 Myalgia, unspecified site: Secondary | ICD-10-CM

## 2020-09-14 NOTE — Progress Notes (Signed)
Chronic Care Management Pharmacy Note  09/15/2020 Name:  Matthew Villanueva MRN:  [id number] DOB:  Jun 02, 1935  Subjective: Matthew Villanueva is an 85 y.o. year old male who is a primary patient of [name], MD.  The CCM team was consulted for assistance with disease management and care coordination needs.    Engaged with patient by telephone for follow up visit in response to provider referral for pharmacy case management and/or care coordination services.   Consent to Services:  The patient was given information about Chronic Care Management services, agreed to services, and gave verbal consent prior to initiation of services.  Please see initial visit note for detailed documentation.   Patient Care Team: [name], MD as PCP - General (Internal Medicine) Chesley Mires, MD (Pulmonary Disease) Monna Fam, MD (Ophthalmology) Charlton Haws, [facility] as Pharmacist (Pharmacist)  Recent office visits: 04/07/20 Dr Ronnald Ramp OV: acute visit, c/o muscle aches. Pt stopped Metanx and Vascepa and has not helped. Referred to dermatology for skin lesion. Rx'd tramadol and referred to rheumatology for myopathy.  01/13/20 Dr Ronnald Ramp OV: increase Rybelsus to 7 mg  Recent consult visits: 08/16/20 Dr Larose Kells (LBSW): acute visit for skin redness after a fall. Rx'd bactroban and Keflex x 1 week.  05/18/20 Dr Benjamine Mola (rheumatology): eval for myalgia. Labs negative for inflammation or damage. No rheumatology testing/tx needed.  03/01/20 Dr Johnsie Cancel (cardiology): f/u for SVT hx ablation, HTN, RBBB. No hx CAD. No med changes.  Hospital visits: None in previous 6 months  Objective:  Lab Results  Component Value Date   CREATININE 0.92 03/30/2020   BUN 15 03/30/2020   GFR 77.70 03/30/2020   GFRNONAA 68 12/16/2019   GFRAA 79 12/16/2019   NA 136 03/30/2020   K 3.5 03/30/2020   CALCIUM 8.8 03/30/2020   CO2 28 03/30/2020   GLUCOSE 125 (H) 03/30/2020    Lab Results  Component Value Date/Time    HGBA1C 7.0 (H) 03/30/2020 10:15 AM   HGBA1C 8.3 (A) 12/16/2019 11:15 AM   HGBA1C 7.9 (A) 11/25/2018 02:00 PM   HGBA1C 7.4 (H) 12/03/2017 09:26 AM   GFR 77.70 03/30/2020 10:15 AM   GFR 67.83 11/25/2018 02:27 PM   MICROALBUR 11.1 12/16/2019 11:40 AM   MICROALBUR 5.8 (H) 11/25/2018 02:27 PM    Last diabetic Eye exam:  Lab Results  Component Value Date/Time   HMDIABEYEEXA No Retinopathy 12/02/2019 12:00 AM    Last diabetic Foot exam:  Lab Results  Component Value Date/Time   HMDIABFOOTEX done 11/08/2017 12:00 AM     Lab Results  Component Value Date   CHOL 156 12/16/2019   HDL 43 12/16/2019   LDLCALC 81 12/16/2019   LDLDIRECT 88.0 09/12/2016   TRIG 228 (H) 12/16/2019   CHOLHDL 3.6 12/16/2019    Hepatic Function Latest Ref Rng & Units 12/16/2019 07/30/2019 11/25/2018  Total Protein 6.1 - 8.1 g/dL 7.2 7.5 7.7  Albumin 3.6 - 4.6 g/dL - 4.3 4.2  AST 10 - 35 U/L _0 ALT 9 - 46 U/L 21 CANCELED 24  Alk Phosphatase 39 - 117 IU/L - 31(L) 21(L)  Total Bilirubin 0.2 - 1.2 mg/dL 0.5 0.3 0.6  Bilirubin, Direct 0.0 - 0.2 mg/dL 0.1 - 0.0    Lab Results  Component Value Date/Time   TSH 1.08 12/16/2019 11:40 AM   TSH 1.56 11/25/2018 02:27 PM    CBC Latest Ref Rng & Units 03/30/2020 12/16/2019 07/30/2019  WBC 4.0 - 10.5 K/uL 10.7(H) 9.3 11.8(H)  Hemoglobin 13.0 - 17.0 g/dL 16.3 17.8(H) 17.9(H)  Hematocrit 39.0 - 52.0 % 47.8 52.6(H) 51.9(H)  Platelets 150.0 - 400.0 K/uL 208.0 230 269    No results found for: VD25OH  Clinical ASCVD: No  The ASCVD Risk score Mikey Bussing DC Jr., et al., 2013) failed to calculate for the following reasons:   The 2013 ASCVD risk score is only valid for ages 99 to 35    Depression screen PHQ 2/9 08/16/2020 12/16/2019 11/25/2018  Decreased Interest 0 0 0  Down, Depressed, Hopeless 0 0 0  PHQ - 2 Score 0 0 0  Altered sleeping - - -  Tired, decreased energy - - -  Change in appetite - - -  Feeling bad or failure about yourself  - - -  Trouble concentrating  - - -  Moving slowly or fidgety/restless - - -  Suicidal thoughts - - -  PHQ-9 Score - - -  Difficult doing work/chores - - -      Social History   Tobacco Use  Smoking Status Former Smoker  . Packs/day: 2.00  . Years: 30.00  . Pack years: 60.00  . Types: Cigarettes  . Quit date: 05/15/1984  . Years since quitting: 36.3  Smokeless Tobacco Never Used   BP Readings from Last 3 Encounters:  08/16/20 (!) 144/66  05/18/20 (!) 100/52  04/07/20 138/86   Pulse Readings from Last 3 Encounters:  08/16/20 77  05/18/20 81  04/07/20 65   Wt Readings from Last 3 Encounters:  08/16/20 253 lb 2 oz (114.8 kg)  05/18/20 250 lb (113.4 kg)  04/07/20 251 lb (113.9 kg)   BMI Readings from Last 3 Encounters:  08/16/20 37.38 kg/m  05/18/20 36.92 kg/m  04/07/20 35.01 kg/m    Assessment/Interventions: Review of patient past medical history, allergies, medications, health status, including review of consultants reports, laboratory and other test data, was performed as part of comprehensive evaluation and provision of chronic care management services.   SDOH:  (Social Determinants of Health) assessments and interventions performed: Yes  SDOH Screenings   Alcohol Screen: Not on file  Depression (PHQ2-9): Low Risk   . PHQ-2 Score: 0  Financial Resource Strain: Medium Risk  . Difficulty of Paying Living Expenses: Somewhat hard  Food Insecurity: Not on file  Housing: Not on file  Physical Activity: Not on file  Social Connections: Not on file  Stress: Not on file  Tobacco Use: Medium Risk  . Smoking Tobacco Use: Former Smoker  . Smokeless Tobacco Use: Never Used  Transportation Needs: Not on file    CCM Care Plan  Allergies  Allergen Reactions  . Livalo [Pitavastatin] Other (See Comments)    Muscle aches  . Crestor [Rosuvastatin]     Muscle aches  . Metformin And Related Diarrhea  . Pravastatin     Muscle aches    Medications Reviewed Today    Reviewed by Charlton Haws, [facility] (Pharmacist) on 09/15/20 at [place] List Status: <None>  Medication Order Taking? Sig Documenting Provider Last Dose Status Informant  Ascorbic Acid (VITAMIN C PO) [id number] Yes Take by mouth. [provider] Taking Active   blood glucose meter kit and supplies KIT [id number] Yes Use to test blood sugar up to twice daily. DX: E11.21 [name], MD Taking Active   Coenzyme Q10 (COQ10 PO) [id number] Yes Take by mouth. [provider] Taking Active   gabapentin (NEURONTIN) 100 MG capsule [id number] Yes Take 100 mg by mouth 3 (  three) times daily. [provider] Taking Active   glucose blood (COOL BLOOD GLUCOSE TEST STRIPS) test strip [id number] Yes Use to test blood sugar up to twice daily. DX: E11.21 Jones, Thomas L, MD Taking Active   JARDIANCE 25 MG TABS tablet [id number] Yes TAKE ONE TABLET BY MOUTH DAILY Jones, Thomas L, MD Taking Active   Lancets (ONETOUCH ULTRASOFT) lancets [id number] Yes Use to test blood sugar up to twice daily. DX: E11.21 Jones, Thomas L, MD Taking Active   losartan-hydrochlorothiazide (HYZAAR) 100-25 MG tablet [id number] Yes TAKE ONE TABLET BY MOUTH DAILY Jones, Thomas L, MD Taking Active   Multiple Vitamin (MULTIVITAMIN) tablet [id number] Yes Take 1 tablet by mouth daily. [provider] Taking Active   Omega-3 Fatty Acids (FISH OIL PO) [id number] Yes Take by mouth. [provider] Taking Active   ONETOUCH VERIO test strip [id number] Yes TEST BLOOD SUGAR TWO TIMES A DAY Jones, Thomas L, MD Taking Active   RYBELSUS 7 MG TABS [id number] Yes TAKE ONE TABLET BY MOUTH DAILY Jones, Thomas L, MD Taking Active   traMADol (ULTRAM) 50 MG tablet [id number] No Take 1 tablet (50 mg total) by mouth every 6 (six) hours as needed for moderate pain.  Patient not taking: Reported on 09/15/2020   Jones, Thomas L, MD Not Taking Active           Patient Active Problem List   Diagnosis Date Noted  . Psoriasis 05/18/2020  . Skin lesion  of chest wall 04/07/2020  . Myalgia 04/07/2020  . Primary osteoarthritis of both hips 03/30/2020  . SCC (squamous cell carcinoma), hand, left 04/23/2019  . 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 Pure hyperglyceridemia 11/09/2015  . OSA (obstructive sleep apnea) 11/09/2015  . Statin intolerance 06/10/2014  . Chronic painful diabetic neuropathy (HCC) 12/24/2013  . Other male erectile dysfunction 12/24/2013  . 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  . Hyperlipidemia with target LDL less than 100 08/12/2013  . BPH associated with nocturia 08/12/2013  . Morbid obesity (HCC) 05/24/2011  . Routine health maintenance 05/24/2011  . HEARING LOSS 04/28/2009  . 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 DJD of shoulder 07/23/2007  . Gout 06/25/2007  . Essential hypertension 06/25/2007    Immunization History  Administered Date(s) Administered  . Fluad Quad(high Dose 65+) 02/17/2019  . H1N1 04/21/2008  . Influenza Whole 04/01/2007, 02/21/2010, 02/13/2011, 03/18/2012  . Influenza, High Dose Seasonal PF 03/06/2016, 01/10/2017, 02/01/2018  . Influenza,inj,Quad PF,6+ Mos 01/20/2013, 05/04/2015, 01/13/2020  . PFIZER(Purple Top)SARS-COV-2 Vaccination 07/06/2019, 08/03/2019, 02/13/2020  . Pneumococcal Conjugate-13 08/12/2013  . Pneumococcal Polysaccharide-23 03/07/2004, 05/17/2017  . Td 07/04/2004, 08/16/2020  . Tdap 05/04/2015  . Zoster 04/29/2010  . Zoster Recombinat (Shingrix) 03/08/2019    Conditions to be addressed/monitored:  Hypertension, Hyperlipidemia, Diabetes and Osteoarthritis, Neuropathy  Care Plan : CCM Pharmacy Care Plan  Updates made by Foltanski, Lindsey N, RPH since 09/15/2020 12:00 AM    Problem: Hypertension, Hyperlipidemia, Diabetes and Osteoarthritis, Neuropathy   Priority: High    Long-Range Goal: Disease management   Start Date: 09/15/2020  Expected End Date: 09/15/2021  This Visit's Progress: On track   Priority: High  Note:   Current Barriers:  . Unable to independently afford treatment regimen . Unable to independently monitor therapeutic efficacy  Pharmacist Clinical Goal(s):  . Patient will verbalize ability to afford treatment regimen . achieve adherence to monitoring guidelines and medication adherence to achieve therapeutic efficacy through collaboration with   PharmD and provider.   Interventions: . 1:1 collaboration with [name], MD regarding development and update of comprehensive plan of care as evidenced by provider attestation and co-signature . Inter-disciplinary care team collaboration (see longitudinal plan of care) . Comprehensive medication review performed; medication list updated in electronic medical record  Hypertension    BP goal is:  <130/80 Patient checks BP at home infrequently Patient home BP readings are ranging: n/a   Patient has failed these meds in the past: n/a Patient is currently controlled on the following medications:   Losartan-HCTZ 100-25 mg daily   We discussed: BP goals; benefits of medications; pt denies side effects; kidney function appropriate for continued use   Plan: Continue current medications and control with diet and exercise    Hyperlipidemia    LDL goal < 100 Patient has failed these meds in past: rosuvastatin, Vascepa, Livalo, ezetimibe Patient is currently controlled on the following medications:   No medication   We discussed:  diet and exercise extensively; Cholesterol goals; LDL is most recently at goal however triglycerides are high; pt could not afford Vascepa and he thinks it worsened leg cramps; improved BG control may improve triglycerides - A1c has improved since lipids were last checked.   Plan: Continue control with diet and exercise  Diabetes    A1c goal <8% Checking BG: Daily Recent FBG Readings: 110-126, 150 after eating out night before   Patient has failed these meds in past: n/a Patient is  currently uncontrolled on the following medications:  Jardiance 25 mg daily  Rybelsus 7 mg daily    We discussed: diet and exercise extensively; A1c goal; benefits of medications; pt has not had A1c rechecked since starting Rybelsus; pt denies side effects; both medications are expensive in donut hole; pt should qualify for PAP based on income   Plan Continue current medications  Pursue PAP for Jardiance ([employer]) and Rybelsus [employer])   Neuropathy / cramps    Patient has failed these meds in past: n/a Patient is currently controlled on the following medications:   Tramadol 50 mg - not using   We discussed:  Pt reports muscle aches/cramps have improved, he does not have them daily and is not using medication for symptoms   Plan: Continue to monitor   Patient Goals/Self-Care Activities . Patient will:  - take medications as prescribed focus on medication adherence by pill box collaborate with provider on medication access solutions - bring income documents to office and sign patient assistance forms  Follow Up Plan: Telephone follow up appointment with care management team member scheduled for: 6 months      Medication Assistance:  [employer] - in process. Expected approval 10/16/20 Rybelsus - [employer] - in process. Expected approval 10/16/20.  Patient's preferred pharmacy is:  [facility] [address] [place] - [address] [place] [address] Phone: [phone] Fax: [phone]  Uses pill box? Yes Pt endorses 100% compliance  We discussed: Current pharmacy is preferred with insurance plan and patient is satisfied with pharmacy services Patient decided to: Continue current medication management strategy  Care Plan and Follow Up Patient Decision:  Patient agrees to Care Plan and Follow-up.  Plan: Telephone follow up appointment with care management team member scheduled for:  6 months  Charlene Brooke, PharmD, [place], CPP Clinical Pharmacist [facility] at [facility] [phone]

## 2020-09-15 NOTE — Patient Instructions (Signed)
Visit Information  Phone number for Pharmacist: [phone]  Goals Addressed            This Visit's Progress   . Manage My Medicine       Timeframe:  Long-Range Goal Priority:  High Start Date:    09/14/20                         Expected End Date:       04/13/21                Follow Up Date 01/11/21   - call for medicine refill 2 or 3 days before it runs out - call if I am sick and can't take my medicine - keep a list of all the medicines I take; vitamins and herbals too - use a pillbox to sort medicine  -Bring Income documents to office and sign paperwork for Rybelsus and Jardiance patient assistance   Why is this important?   . These steps will help you keep on track with your medicines.   Notes:       Patient verbalizes understanding of instructions provided today and agrees to view in [place].  Telephone follow up appointment with pharmacy team member scheduled for: 6 months  Charlene Brooke, PharmD, [place], CPP Clinical Pharmacist [facility] [phone]

## 2020-09-27 DIAGNOSIS — Z6835 Body mass index (BMI) 35.0-35.9, adult: Secondary | ICD-10-CM | POA: Diagnosis not present

## 2020-09-27 DIAGNOSIS — E1165 Type 2 diabetes mellitus with hyperglycemia: Secondary | ICD-10-CM | POA: Diagnosis not present

## 2020-09-27 DIAGNOSIS — Z7982 Long term (current) use of aspirin: Secondary | ICD-10-CM | POA: Diagnosis not present

## 2020-09-27 DIAGNOSIS — Z8249 Family history of ischemic heart disease and other diseases of the circulatory system: Secondary | ICD-10-CM | POA: Diagnosis not present

## 2020-09-27 DIAGNOSIS — Z7984 Long term (current) use of oral hypoglycemic drugs: Secondary | ICD-10-CM | POA: Diagnosis not present

## 2020-09-27 DIAGNOSIS — M1990 Unspecified osteoarthritis, unspecified site: Secondary | ICD-10-CM | POA: Diagnosis not present

## 2020-09-27 DIAGNOSIS — E1142 Type 2 diabetes mellitus with diabetic polyneuropathy: Secondary | ICD-10-CM | POA: Diagnosis not present

## 2020-09-27 DIAGNOSIS — I10 Essential (primary) hypertension: Secondary | ICD-10-CM | POA: Diagnosis not present

## 2020-09-27 DIAGNOSIS — Z809 Family history of malignant neoplasm, unspecified: Secondary | ICD-10-CM | POA: Diagnosis not present

## 2020-10-05 DIAGNOSIS — E1140 Type 2 diabetes mellitus with diabetic neuropathy, unspecified: Secondary | ICD-10-CM | POA: Diagnosis not present

## 2020-10-05 DIAGNOSIS — R278 Other lack of coordination: Secondary | ICD-10-CM | POA: Diagnosis not present

## 2020-10-05 DIAGNOSIS — G4730 Sleep apnea, unspecified: Secondary | ICD-10-CM | POA: Diagnosis not present

## 2020-10-05 DIAGNOSIS — R202 Paresthesia of skin: Secondary | ICD-10-CM | POA: Diagnosis not present

## 2020-10-05 DIAGNOSIS — M79672 Pain in left foot: Secondary | ICD-10-CM | POA: Diagnosis not present

## 2020-10-05 DIAGNOSIS — G609 Hereditary and idiopathic neuropathy, unspecified: Secondary | ICD-10-CM | POA: Diagnosis not present

## 2020-10-05 DIAGNOSIS — R200 Anesthesia of skin: Secondary | ICD-10-CM | POA: Diagnosis not present

## 2020-10-05 DIAGNOSIS — M79671 Pain in right foot: Secondary | ICD-10-CM | POA: Diagnosis not present

## 2020-10-08 DIAGNOSIS — E1140 Type 2 diabetes mellitus with diabetic neuropathy, unspecified: Secondary | ICD-10-CM | POA: Diagnosis not present

## 2020-10-08 DIAGNOSIS — R200 Anesthesia of skin: Secondary | ICD-10-CM | POA: Diagnosis not present

## 2020-10-08 DIAGNOSIS — M79671 Pain in right foot: Secondary | ICD-10-CM | POA: Diagnosis not present

## 2020-10-08 DIAGNOSIS — G609 Hereditary and idiopathic neuropathy, unspecified: Secondary | ICD-10-CM | POA: Diagnosis not present

## 2020-10-08 DIAGNOSIS — G4730 Sleep apnea, unspecified: Secondary | ICD-10-CM | POA: Diagnosis not present

## 2020-10-08 DIAGNOSIS — R278 Other lack of coordination: Secondary | ICD-10-CM | POA: Diagnosis not present

## 2020-10-08 DIAGNOSIS — M79672 Pain in left foot: Secondary | ICD-10-CM | POA: Diagnosis not present

## 2020-10-08 DIAGNOSIS — R202 Paresthesia of skin: Secondary | ICD-10-CM | POA: Diagnosis not present

## 2020-10-14 DIAGNOSIS — R278 Other lack of coordination: Secondary | ICD-10-CM | POA: Diagnosis not present

## 2020-10-14 DIAGNOSIS — M79671 Pain in right foot: Secondary | ICD-10-CM | POA: Diagnosis not present

## 2020-10-14 DIAGNOSIS — R202 Paresthesia of skin: Secondary | ICD-10-CM | POA: Diagnosis not present

## 2020-10-14 DIAGNOSIS — M79672 Pain in left foot: Secondary | ICD-10-CM | POA: Diagnosis not present

## 2020-10-14 DIAGNOSIS — G609 Hereditary and idiopathic neuropathy, unspecified: Secondary | ICD-10-CM | POA: Diagnosis not present

## 2020-10-14 DIAGNOSIS — R200 Anesthesia of skin: Secondary | ICD-10-CM | POA: Diagnosis not present

## 2020-10-14 DIAGNOSIS — E1140 Type 2 diabetes mellitus with diabetic neuropathy, unspecified: Secondary | ICD-10-CM | POA: Diagnosis not present

## 2020-10-14 DIAGNOSIS — G4730 Sleep apnea, unspecified: Secondary | ICD-10-CM | POA: Diagnosis not present

## 2020-10-15 ENCOUNTER — Other Ambulatory Visit: Payer: Self-pay | Admitting: Internal Medicine

## 2020-10-15 DIAGNOSIS — M79672 Pain in left foot: Secondary | ICD-10-CM | POA: Diagnosis not present

## 2020-10-15 DIAGNOSIS — G4730 Sleep apnea, unspecified: Secondary | ICD-10-CM | POA: Diagnosis not present

## 2020-10-15 DIAGNOSIS — M79671 Pain in right foot: Secondary | ICD-10-CM | POA: Diagnosis not present

## 2020-10-15 DIAGNOSIS — R278 Other lack of coordination: Secondary | ICD-10-CM | POA: Diagnosis not present

## 2020-10-15 DIAGNOSIS — G609 Hereditary and idiopathic neuropathy, unspecified: Secondary | ICD-10-CM | POA: Diagnosis not present

## 2020-10-15 DIAGNOSIS — E1140 Type 2 diabetes mellitus with diabetic neuropathy, unspecified: Secondary | ICD-10-CM | POA: Diagnosis not present

## 2020-10-15 DIAGNOSIS — R200 Anesthesia of skin: Secondary | ICD-10-CM | POA: Diagnosis not present

## 2020-10-15 DIAGNOSIS — R202 Paresthesia of skin: Secondary | ICD-10-CM | POA: Diagnosis not present

## 2020-10-18 DIAGNOSIS — M79672 Pain in left foot: Secondary | ICD-10-CM | POA: Diagnosis not present

## 2020-10-18 DIAGNOSIS — G4730 Sleep apnea, unspecified: Secondary | ICD-10-CM | POA: Diagnosis not present

## 2020-10-18 DIAGNOSIS — R202 Paresthesia of skin: Secondary | ICD-10-CM | POA: Diagnosis not present

## 2020-10-18 DIAGNOSIS — M79671 Pain in right foot: Secondary | ICD-10-CM | POA: Diagnosis not present

## 2020-10-18 DIAGNOSIS — R278 Other lack of coordination: Secondary | ICD-10-CM | POA: Diagnosis not present

## 2020-10-18 DIAGNOSIS — E1140 Type 2 diabetes mellitus with diabetic neuropathy, unspecified: Secondary | ICD-10-CM | POA: Diagnosis not present

## 2020-10-18 DIAGNOSIS — G609 Hereditary and idiopathic neuropathy, unspecified: Secondary | ICD-10-CM | POA: Diagnosis not present

## 2020-10-18 DIAGNOSIS — R200 Anesthesia of skin: Secondary | ICD-10-CM | POA: Diagnosis not present

## 2020-10-20 DIAGNOSIS — G4730 Sleep apnea, unspecified: Secondary | ICD-10-CM | POA: Diagnosis not present

## 2020-10-20 DIAGNOSIS — E1140 Type 2 diabetes mellitus with diabetic neuropathy, unspecified: Secondary | ICD-10-CM | POA: Diagnosis not present

## 2020-10-20 DIAGNOSIS — R200 Anesthesia of skin: Secondary | ICD-10-CM | POA: Diagnosis not present

## 2020-10-20 DIAGNOSIS — R202 Paresthesia of skin: Secondary | ICD-10-CM | POA: Diagnosis not present

## 2020-10-20 DIAGNOSIS — M79671 Pain in right foot: Secondary | ICD-10-CM | POA: Diagnosis not present

## 2020-10-20 DIAGNOSIS — M79672 Pain in left foot: Secondary | ICD-10-CM | POA: Diagnosis not present

## 2020-10-20 DIAGNOSIS — G609 Hereditary and idiopathic neuropathy, unspecified: Secondary | ICD-10-CM | POA: Diagnosis not present

## 2020-10-20 DIAGNOSIS — R278 Other lack of coordination: Secondary | ICD-10-CM | POA: Diagnosis not present

## 2020-10-21 ENCOUNTER — Telehealth: Payer: Self-pay | Admitting: Pharmacist

## 2020-10-21 NOTE — Progress Notes (Signed)
    Chronic Care Management Pharmacy Assistant   Name: Matthew Villanueva  MRN: [id number] DOB: 1935/06/18   Medications: Outpatient Encounter Medications as of 10/21/2020  Medication Sig   Ascorbic Acid (VITAMIN C PO) Take by mouth.   blood glucose meter kit and supplies KIT Use to test blood sugar up to twice daily. DX: E11.21   Coenzyme Q10 (COQ10 PO) Take by mouth.   gabapentin (NEURONTIN) 100 MG capsule Take 100 mg by mouth 3 (three) times daily.   glucose blood (COOL BLOOD GLUCOSE TEST STRIPS) test strip Use to test blood sugar up to twice daily. DX: E11.21   JARDIANCE 25 MG TABS tablet TAKE ONE TABLET BY MOUTH DAILY   Lancets (ONETOUCH ULTRASOFT) lancets Use to test blood sugar up to twice daily. DX: E11.21   losartan-hydrochlorothiazide (HYZAAR) 100-25 MG tablet TAKE ONE TABLET BY MOUTH DAILY   Multiple Vitamin (MULTIVITAMIN) tablet Take 1 tablet by mouth daily.   Omega-3 Fatty Acids (FISH OIL PO) Take by mouth.   ONETOUCH VERIO test strip TEST BLOOD SUGAR TWO TIMES A DAY   RYBELSUS 7 MG TABS TAKE ONE TABLET BY MOUTH DAILY   traMADol (ULTRAM) 50 MG tablet Take 1 tablet (50 mg total) by mouth every 6 (six) hours as needed for moderate pain. (Patient not taking: Reported on 09/15/2020)   No facility-administered encounter medications on file as of 10/21/2020.   Pharmacist Review  A call was made today to [place] to check on the status of patient application. The representative stated that there is no application on file for the patient. Messaged clinical pharmacist Mendel Ryder who stated that the patient never came in to fill out paper work. Called the patient to see if he still wanted to fill out the application for Jardiance and Rybelsus. The patient stated he is still interested in getting assistance and would like the forms mailed to his home. Sent another messages to the clinical pharmacist stating to mail forms to patient.   [place] Pharmacist  Assistant [phone]   Time spent:18

## 2020-10-22 DIAGNOSIS — E1140 Type 2 diabetes mellitus with diabetic neuropathy, unspecified: Secondary | ICD-10-CM | POA: Diagnosis not present

## 2020-10-22 DIAGNOSIS — G609 Hereditary and idiopathic neuropathy, unspecified: Secondary | ICD-10-CM | POA: Diagnosis not present

## 2020-10-22 DIAGNOSIS — R202 Paresthesia of skin: Secondary | ICD-10-CM | POA: Diagnosis not present

## 2020-10-22 DIAGNOSIS — R278 Other lack of coordination: Secondary | ICD-10-CM | POA: Diagnosis not present

## 2020-10-22 DIAGNOSIS — R200 Anesthesia of skin: Secondary | ICD-10-CM | POA: Diagnosis not present

## 2020-10-22 DIAGNOSIS — G4730 Sleep apnea, unspecified: Secondary | ICD-10-CM | POA: Diagnosis not present

## 2020-10-22 DIAGNOSIS — M79671 Pain in right foot: Secondary | ICD-10-CM | POA: Diagnosis not present

## 2020-10-22 DIAGNOSIS — M79672 Pain in left foot: Secondary | ICD-10-CM | POA: Diagnosis not present

## 2020-10-27 DIAGNOSIS — R278 Other lack of coordination: Secondary | ICD-10-CM | POA: Diagnosis not present

## 2020-10-27 DIAGNOSIS — R200 Anesthesia of skin: Secondary | ICD-10-CM | POA: Diagnosis not present

## 2020-10-27 DIAGNOSIS — M79672 Pain in left foot: Secondary | ICD-10-CM | POA: Diagnosis not present

## 2020-10-27 DIAGNOSIS — E1140 Type 2 diabetes mellitus with diabetic neuropathy, unspecified: Secondary | ICD-10-CM | POA: Diagnosis not present

## 2020-10-27 DIAGNOSIS — M79671 Pain in right foot: Secondary | ICD-10-CM | POA: Diagnosis not present

## 2020-10-27 DIAGNOSIS — G4730 Sleep apnea, unspecified: Secondary | ICD-10-CM | POA: Diagnosis not present

## 2020-10-27 DIAGNOSIS — R202 Paresthesia of skin: Secondary | ICD-10-CM | POA: Diagnosis not present

## 2020-10-27 DIAGNOSIS — G609 Hereditary and idiopathic neuropathy, unspecified: Secondary | ICD-10-CM | POA: Diagnosis not present

## 2020-10-29 DIAGNOSIS — G609 Hereditary and idiopathic neuropathy, unspecified: Secondary | ICD-10-CM | POA: Diagnosis not present

## 2020-10-29 DIAGNOSIS — M79671 Pain in right foot: Secondary | ICD-10-CM | POA: Diagnosis not present

## 2020-10-29 DIAGNOSIS — R200 Anesthesia of skin: Secondary | ICD-10-CM | POA: Diagnosis not present

## 2020-10-29 DIAGNOSIS — E1140 Type 2 diabetes mellitus with diabetic neuropathy, unspecified: Secondary | ICD-10-CM | POA: Diagnosis not present

## 2020-10-29 DIAGNOSIS — R278 Other lack of coordination: Secondary | ICD-10-CM | POA: Diagnosis not present

## 2020-10-29 DIAGNOSIS — M79672 Pain in left foot: Secondary | ICD-10-CM | POA: Diagnosis not present

## 2020-10-29 DIAGNOSIS — G4730 Sleep apnea, unspecified: Secondary | ICD-10-CM | POA: Diagnosis not present

## 2020-10-29 DIAGNOSIS — R202 Paresthesia of skin: Secondary | ICD-10-CM | POA: Diagnosis not present

## 2020-10-29 NOTE — Telephone Encounter (Signed)
Mailed patient sections for [employer] and [employer] application to patient, at his request. Faxed completed provider sections to companies.

## 2020-11-05 ENCOUNTER — Telehealth: Payer: Self-pay | Admitting: Pharmacist

## 2020-11-05 NOTE — Progress Notes (Signed)
error 

## 2020-11-05 NOTE — Progress Notes (Signed)
Called and spoke with patient about his patient assistance application, patient states he has not received application in the mail yet and will come by the office next week to pick up one.  Orinda Kenner, [place] Clinical Pharmacists Assistant [phone]  Time Spent: [phone]

## 2020-11-05 NOTE — Telephone Encounter (Signed)
Patient did not receive Jardiance and Rybelsus applications in the mail. Printed out applications for patient to pick up from front office. Instructions included.

## 2020-11-11 DIAGNOSIS — Z23 Encounter for immunization: Secondary | ICD-10-CM | POA: Diagnosis not present

## 2020-11-21 ENCOUNTER — Other Ambulatory Visit: Payer: Self-pay | Admitting: Internal Medicine

## 2020-11-21 DIAGNOSIS — Z794 Long term (current) use of insulin: Secondary | ICD-10-CM

## 2020-11-21 DIAGNOSIS — E1121 Type 2 diabetes mellitus with diabetic nephropathy: Secondary | ICD-10-CM

## 2020-11-23 ENCOUNTER — Other Ambulatory Visit: Payer: Self-pay | Admitting: Internal Medicine

## 2020-11-23 ENCOUNTER — Telehealth: Payer: Self-pay | Admitting: Internal Medicine

## 2020-11-23 DIAGNOSIS — E1121 Type 2 diabetes mellitus with diabetic nephropathy: Secondary | ICD-10-CM

## 2020-11-23 DIAGNOSIS — Z794 Long term (current) use of insulin: Secondary | ICD-10-CM

## 2020-11-23 MED ORDER — EMPAGLIFLOZIN 25 MG PO TABS: 25.0000 mg | ORAL_TABLET | Freq: Every day | ORAL | 0 refills | Status: DC

## 2020-11-23 NOTE — Telephone Encounter (Signed)
1.Medication Requested: JARDIANCE 25 MG TABS tablet   2. Pharmacy (Name, Street, [place] [facility]): [facility] [id number] - [place], [place]  3. On Med List: yes   4. Last Visit with PCP: 04-07-20  5. Next visit date with PCP: 12-20-20   Agent: Please be advised that RX refills may take up to 3 business days. We ask that you follow-up with your pharmacy.

## 2020-11-27 ENCOUNTER — Other Ambulatory Visit: Payer: Self-pay | Admitting: Internal Medicine

## 2020-11-27 DIAGNOSIS — I10 Essential (primary) hypertension: Secondary | ICD-10-CM

## 2020-11-27 DIAGNOSIS — E1121 Type 2 diabetes mellitus with diabetic nephropathy: Secondary | ICD-10-CM

## 2020-12-06 ENCOUNTER — Telehealth: Payer: Self-pay | Admitting: Internal Medicine

## 2020-12-06 NOTE — Telephone Encounter (Signed)
LVM for pt to rtn my call to schedule AWV with [facility]. Please schedule AWV if pt calls the office  

## 2020-12-15 ENCOUNTER — Other Ambulatory Visit: Payer: Self-pay

## 2020-12-15 ENCOUNTER — Ambulatory Visit (INDEPENDENT_AMBULATORY_CARE_PROVIDER_SITE_OTHER): Payer: Medicare HMO

## 2020-12-15 VITALS — BP 120/60 | HR 74 | Temp 98.3°F | Ht 69.0 in | Wt 241.2 lb

## 2020-12-15 DIAGNOSIS — Z0000 Encounter for general adult medical examination without abnormal findings: Secondary | ICD-10-CM

## 2020-12-15 NOTE — Progress Notes (Signed)
Subjective:   Matthew Villanueva is a 85 y.o. male who presents for Medicare Annual/Subsequent preventive examination.  Review of Systems     Cardiac Risk Factors include: advanced age (>48men, >81 women);diabetes mellitus;dyslipidemia;male gender;obesity (BMI >30kg/m2)     Objective:    Today's Vitals   12/15/20 1510  BP: 120/60  Pulse: 74  Temp: 98.3 F (36.8 C)  SpO2: 96%  Weight: 241 lb 3.2 oz (109.4 kg)  Height: $Remove'5\' 9"'ZLKqkey$  (1.753 m)  PainSc: 0-No pain   Body mass index is 35.62 kg/m.  Advanced Directives 12/15/2020 12/04/2017 11/29/2016 03/06/2016 11/11/2015 11/09/2015  Does Patient Have a Medical Advance Directive? Yes Yes No;Yes No Yes Yes  Type of Advance Directive Living will;Healthcare Power of [place];Living will - - [place];Living will [place];Living will  Does patient want to make changes to medical advance directive? No - Patient declined - - - No - Patient declined No - Patient declined  Copy of [place] in Chart? No - copy requested No - copy requested - - No - copy requested No - copy requested  Would patient like information on creating a medical advance directive? - - Yes (ED - Information included in AVS) - - -    Current Medications (verified) Outpatient Encounter Medications as of 12/15/2020  Medication Sig   Ascorbic Acid (VITAMIN C PO) Take by mouth.   blood glucose meter kit and supplies KIT Use to test blood sugar up to twice daily. DX: E11.21   Coenzyme Q10 (COQ10 PO) Take by mouth.   empagliflozin (JARDIANCE) 25 MG TABS tablet Take 1 tablet (25 mg total) by mouth daily.   gabapentin (NEURONTIN) 100 MG capsule Take 100 mg by mouth 3 (three) times daily.   glucose blood (COOL BLOOD GLUCOSE TEST STRIPS) test strip Use to test blood sugar up to twice daily. DX: E11.21   Lancets (ONETOUCH ULTRASOFT) lancets Use to test blood sugar up to twice daily. DX: E11.21    losartan-hydrochlorothiazide (HYZAAR) 100-25 MG tablet TAKE ONE TABLET BY MOUTH DAILY   Multiple Vitamin (MULTIVITAMIN) tablet Take 1 tablet by mouth daily.   Omega-3 Fatty Acids (FISH OIL PO) Take by mouth.   ONETOUCH VERIO test strip TEST BLOOD SUGAR TWO TIMES A DAY   RYBELSUS 7 MG TABS TAKE ONE TABLET BY MOUTH DAILY   traMADol (ULTRAM) 50 MG tablet Take 1 tablet (50 mg total) by mouth every 6 (six) hours as needed for moderate pain. (Patient not taking: No sig reported)   No facility-administered encounter medications on file as of 12/15/2020.    Allergies (verified) Livalo [pitavastatin], Crestor [rosuvastatin], Metformin and related, and Pravastatin   History: Past Medical History:  Diagnosis Date   Arthritis    Diabetes mellitus    NO INSULIN   Dyspnea    Gout    History of colonic polyps    HTN (hypertension)    Localized osteoarthrosis not specified whether primary or secondary, hand    OSA (obstructive sleep apnea)    Other specified cardiac dysrhythmias(427.89) 2004   ABLATION DUE TO TACHYCARDIA   Right bundle branch block    Past Surgical History:  Procedure Laterality Date   CARDIAC ELECTROPHYSIOLOGY STUDY AND ABLATION  2004   ablation for SVT with a chronic right bundle branch block    HEMORRHOID SURGERY  1960   PILONIDAL CYST DRAINAGE     Family History  Problem Relation Age of Onset  Cancer Father        throat and tongue   Heart disease Mother    Lung cancer Sister    Diabetes Maternal Uncle    Heart attack Maternal Grandmother    Heart disease Maternal Grandmother        CAD/MI   Heart disease Maternal Grandfather    Heart disease Maternal Aunt        CAD   Social History   Socioeconomic History   Marital status: Single    Spouse name: Not on file   Number of children: Not on file   Years of education: 16   Highest education level: Not on file  Occupational History   Occupation: self employed, Chemical engineer: SELF-EMPLOYED   Tobacco Use   Smoking status: Former    Packs/day: 2.00    Years: 30.00    Pack years: 60.00    Types: Cigarettes    Quit date: 05/15/1984    Years since quitting: 36.6   Smokeless tobacco: Never  Vaping Use   Vaping Use: Never used  Substance and Sexual Activity   Alcohol use: Yes    Comment: Rarely   Drug use: No   Sexual activity: Not Currently  Other Topics Concern   Not on file  Social History Narrative   HSG, [place]   Single   Work: self employed...Recruitment consultant   I-ADLs   End-of-Life: Yes-CPR, Yes-short-term Mechanical Ventilation, but no prolonged ventilation. Is willing to undergo dialysis, prolonged tub feeding.   Pt states former smoker. 1986. 2ppd. Started age 65   Social Determinants of Health   Financial Resource Strain: Low Risk    Difficulty of Paying Living Expenses: Not hard at all  Food Insecurity: No Food Insecurity   Worried About Charity fundraiser in the Last Year: Never true   Arboriculturist in the Last Year: Never true  Transportation Needs: No Transportation Needs   Lack of Transportation (Medical): No   Lack of Transportation (Non-Medical): No  Physical Activity: Inactive   Days of Exercise per Week: 0 days   Minutes of Exercise per Session: 0 min  Stress: No Stress Concern Present   Feeling of Stress : Not at all  Social Connections: Moderately Integrated   Frequency of Communication with Friends and Family: More than three times a week   Frequency of Social Gatherings with Friends and Family: More than three times a week   Attends Religious Services: 1 to 4 times per year   Active Member of [employer] or Organizations: Yes   Attends Archivist Meetings: 1 to 4 times per year   Marital Status: Never married    Tobacco Counseling Counseling given: Not Answered   Clinical Intake:  Pre-visit preparation completed: Yes  Pain : No/denies pain Pain Score: 0-No pain     BMI - recorded: 35.62 Nutritional Status: BMI  > 30  Obese Nutritional Risks: None Diabetes: Yes CBG done?: No Did pt. bring in CBG monitor from home?: No  How often do you need to have someone help you when you read instructions, pamphlets, or other written materials from your doctor or pharmacy?: 1 - Never What is the last grade level you completed in school?: Bachelor's Degree; [employer]  Diabetic? yes  Interpreter Needed?: No  Information entered by :: Lisette Abu, LPN   Activities of Daily Living In your present state of health, do you have any difficulty performing the following activities:  12/15/2020 04/07/2020  Hearing? Tempie Donning  Vision? N N  Difficulty concentrating or making decisions? N N  Walking or climbing stairs? N N  Dressing or bathing? N N  Doing errands, shopping? N N  Preparing Food and eating ? N -  Using the Toilet? N -  In the past six months, have you accidently leaked urine? N -  Do you have problems with loss of bowel control? N -  Managing your Medications? N -  Managing your Finances? N -  Housekeeping or managing your Housekeeping? N -  Some recent data might be hidden    Patient Care Team: [name], MD as PCP - General (Internal Medicine) Chesley Mires, MD (Pulmonary Disease) Monna Fam, MD (Ophthalmology) Charlton Haws, [facility] as Pharmacist (Pharmacist)  Indicate any recent Medical Services you may have received from other than [facility] providers in the past year (date may be approximate).     Assessment:   This is a routine wellness examination for Seng.  Hearing/Vision screen Hearing Screening - Comments:: Patient stated that he has decreased hearing and wears hearing aids. Vision Screening - Comments:: Patient wears glasses.  Eye exams done once a year by Dr. Monna Fam.  Dietary issues and exercise activities discussed: Current Exercise Habits: The patient does not participate in regular exercise at present, Exercise limited by: None identified    Goals Addressed   None   Depression Screen PHQ 2/9 Scores 12/15/2020 08/16/2020 12/16/2019 11/25/2018 12/04/2017 12/03/2017 11/29/2016  PHQ - 2 Score 0 0 0 0 0 0 0  PHQ- 9 Score - - - - - - 2    Fall Risk Fall Risk  12/15/2020 08/16/2020 12/16/2019 11/25/2018 12/04/2017  Falls in the past year? 1 1 0 0 No  Number falls in past yr: 0 0 0 0 -  Injury with Fall? 1 0 0 0 -  Risk for fall due to : - - No Fall Risks - -  Follow up - - Falls evaluation completed Falls evaluation completed -    FALL RISK PREVENTION PERTAINING TO THE HOME:  Any stairs in or around the home? No  If so, are there any without handrails? No  Home free of loose throw rugs in walkways, pet beds, electrical cords, etc? Yes  Adequate lighting in your home to reduce risk of falls? Yes   ASSISTIVE DEVICES UTILIZED TO PREVENT FALLS:  Life alert? Yes  Use of a cane, walker or w/c? No  Grab bars in the bathroom? Yes  Shower chair or bench in shower? No  Elevated toilet seat or a handicapped toilet? No   TIMED UP AND GO:  Was the test performed? Yes .  Length of time to ambulate 10 feet: 7 sec.   Gait steady and fast without use of assistive device  Cognitive Function:Normal cognitive status assessed by direct observation by this Nurse Health Advisor. No abnormalities found.   MMSE - Mini Mental State Exam 12/04/2017  Orientation to time 5  Orientation to Place 5  Registration 3  Attention/ Calculation 5  Recall 3  Language- name 2 objects 2  Language- repeat 1  Language- follow 3 step command 3  Language- read & follow direction 1  Write a sentence 1  Copy design 1  Total score 30        Immunizations Immunization History  Administered Date(s) Administered   Fluad Quad(high Dose 65+) 02/17/2019   H1N1 04/21/2008   Influenza Whole 04/01/2007, 02/21/2010, 02/13/2011, 03/18/2012  Influenza, High Dose Seasonal PF 03/06/2016, 01/10/2017, 02/01/2018   Influenza,inj,Quad PF,6+ Mos 01/20/2013, 05/04/2015, 01/13/2020    PFIZER(Purple Top)SARS-COV-2 Vaccination 07/06/2019, 08/03/2019, 02/13/2020   Pneumococcal Conjugate-13 08/12/2013   Pneumococcal Polysaccharide-23 03/07/2004, 05/17/2017   Td 07/04/2004, 08/16/2020   Tdap 05/04/2015   Zoster Recombinat (Shingrix) 03/08/2019   Zoster, Live 04/29/2010    TDAP status: Up to date  Flu Vaccine status: Up to date  Pneumococcal vaccine status: Up to date  Covid-19 vaccine status: Completed vaccines  Qualifies for Shingles Vaccine? Yes   Zostavax completed Yes   Shingrix Completed?: No.    Education has been provided regarding the importance of this vaccine. Patient has been advised to call insurance company to determine out of pocket expense if they have not yet received this vaccine. Advised may also receive vaccine at local pharmacy or Health Dept. Verbalized acceptance and understanding.  Screening Tests Health Maintenance  Topic Date Due   Zoster Vaccines- Shingrix (2 of 2) 05/03/2019   COVID-19 Vaccine (4 - Booster for Pfizer series) 05/15/2020   HEMOGLOBIN A1C  09/27/2020   OPHTHALMOLOGY EXAM  12/01/2020   INFLUENZA VACCINE  12/13/2020   FOOT EXAM  03/30/2021   TETANUS/TDAP  08/17/2030   PNA vac Low Risk Adult  Completed   HPV VACCINES  Aged Out    Health Maintenance  Health Maintenance Due  Topic Date Due   Zoster Vaccines- Shingrix (2 of 2) 05/03/2019   COVID-19 Vaccine (4 - Booster for Pfizer series) 05/15/2020   HEMOGLOBIN A1C  09/27/2020   OPHTHALMOLOGY EXAM  12/01/2020   INFLUENZA VACCINE  12/13/2020    Colorectal cancer screening: No longer required.   Lung Cancer Screening: (Low Dose CT Chest recommended if Age 36-80 years, 30 pack-year currently smoking OR have quit w/in 15years.) does not qualify.   Lung Cancer Screening Referral: no  Additional Screening:  Hepatitis C Screening: does not qualify; Completed no  Vision Screening: Recommended annual ophthalmology exams for early detection of glaucoma and other  disorders of the eye. Is the patient up to date with their annual eye exam?  Yes  Who is the provider or what is the name of the office in which the patient attends annual eye exams? Dr. Monna Fam If pt is not established with a provider, would they like to be referred to a provider to establish care? No .   Dental Screening: Recommended annual dental exams for proper oral hygiene  Community Resource Referral / Chronic Care Management: CRR required this visit?  No   CCM required this visit?  No      Plan:     I have personally reviewed and noted the following in the patient's chart:   Medical and social history Use of alcohol, tobacco or illicit drugs  Current medications and supplements including opioid prescriptions. Patient is not currently taking opioid prescriptions. Functional ability and status Nutritional status Physical activity Advanced directives List of other physicians Hospitalizations, surgeries, and ER visits in previous 12 months Vitals Screenings to include cognitive, depression, and falls Referrals and appointments  In addition, I have reviewed and discussed with patient certain preventive protocols, quality metrics, and best practice recommendations. A written personalized care plan for preventive services as well as general preventive health recommendations were provided to patient.     Sheral Flow, LPN   12/21/2798   Nurse Notes: n/a

## 2020-12-15 NOTE — Patient Instructions (Addendum)
[name] , Thank you for taking time to come for your Medicare Wellness Visit. I appreciate your ongoing commitment to your health goals. Please review the following plan we discussed and let me know if I can assist you in the future.   Screening recommendations/referrals: Colonoscopy: last done 04/14/2015; no repeat due to age Recommended yearly ophthalmology/optometry visit for glaucoma screening and checkup Recommended yearly dental visit for hygiene and checkup  Vaccinations: Influenza vaccine: 01/13/2020 Pneumococcal vaccine: 08/12/2013, 05/17/2017 Tdap vaccine: 08/16/2020; due every 10 years Shingles vaccine: 03/08/2019; need second dose   Covid-19: 07/06/2019, 07/15/2019, 02/13/2020, 11/11/2020  Advanced directives: Please bring a copy of your health care power of attorney and living will to the office at your convenience.  Conditions/risks identified: Client understands the importance of follow-up with providers by attending scheduled visits and discussed goals to eat healthier, increase physical activity, exercise the brain, socialize more, get enough sleep and make time for laughter.  Next appointment: Please schedule your next Medicare Wellness Visit with your Nurse Health Advisor in 1 year by calling [phone].  Preventive Care 22 Years and Older, Male Preven/tive care refers to lifestyle choices and visits with your health c/are provider that can promote health and wellness. What does preventive care include? A yearly physical exam. This is also called an annual well check. Dental exams once or twice a year. Routine eye exams. Ask your health care provider how often you should have your eyes checked. Personal lifestyle choices, including: Daily care of your teeth and gums. Regular physical activity. Eating a healthy diet. Avoiding tobacco and drug use. Limiting alcohol use. Practicing safe sex. Taking low doses of aspirin every day. Taking vitamin and mineral supplements as  recommended by your health care provider. What happens during an annual well check? The services and screenings done by your health care provider during your annual well check will depend on your age, overall health, lifestyle risk factors, and family history of disease. Counseling  Your health care provider may ask you questions about your: Alcohol use. Tobacco use. Drug use. Emotional well-being. Home and relationship well-being. Sexual activity. Eating habits. History of falls. Memory and ability to understand (cognition). Work and work Statistician. Screening  You may have the following tests or measurements: Height, weight, and BMI. Blood pressure. Lipid and cholesterol levels. These may be checked every 5 years, or more frequently if you are over 2 years old. Skin check. Lung cancer screening. You may have this screening every year starting at age 22 if you have a 30-pack-year history of smoking and currently smoke or have quit within the past 15 years. Fecal occult blood test (FOBT) of the stool. You may have this test every year starting at age 74. Flexible sigmoidoscopy or colonoscopy. You may have a sigmoidoscopy every 5 years or a colonoscopy every 10 years starting at age 54. Prostate cancer screening. Recommendations will vary depending on your family history and other risks. Hepatitis C blood test. Hepatitis B blood test. Sexually transmitted disease (STD) testing. Diabetes screening. This is done by checking your blood sugar (glucose) after you have not eaten for a while (fasting). You may have this done every 1-3 years. Abdominal aortic aneurysm (AAA) screening. You may need this if you are a current or former smoker. Osteoporosis. You may be screened starting at age 41 if you are at high risk. Talk with your health care provider about your test results, treatment options, and if necessary, the need for more tests. Vaccines  Your  health care provider may recommend  certain vaccines, such as: Influenza vaccine. This is recommended every year. Tetanus, diphtheria, and acellular pertussis (Tdap, Td) vaccine. You may need a Td booster every 10 years. Zoster vaccine. You may need this after age 19. Pneumococcal 13-valent conjugate (PCV13) vaccine. One dose is recommended after age 27. Pneumococcal polysaccharide (PPSV23) vaccine. One dose is recommended after age 71. Talk to your health care provider about which screenings and vaccines you need and how often you need them. This information is not intended to replace advice given to you by your health care provider. Make sure you discuss any questions you have with your health care provider. Document Released: 05/28/2015 Document Revised: 01/19/2016 Document Reviewed: 03/02/2015 Elsevier Interactive Patient Education  2017 [place] Prevention in the Home Falls can cause injuries. They can happen to people of all ages. There are many things you can do to make your home safe and to help prevent falls. What can I do on the outside of my home? Regularly fix the edges of walkways and driveways and fix any cracks. Remove anything that might make you trip as you walk through a door, such as a raised step or threshold. Trim any bushes or trees on the path to your home. Use bright outdoor lighting. Clear any walking paths of anything that might make someone trip, such as rocks or tools. Regularly check to see if handrails are loose or broken. Make sure that both sides of any steps have handrails. Any raised decks and porches should have guardrails on the edges. Have any leaves, snow, or ice cleared regularly. Use sand or salt on walking paths during winter. Clean up any spills in your garage right away. This includes oil or grease spills. What can I do in the bathroom? Use night lights. Install grab bars by the toilet and in the tub and shower. Do not use towel bars as grab bars. Use non-skid mats or  decals in the tub or shower. If you need to sit down in the shower, use a plastic, non-slip stool. Keep the floor dry. Clean up any water that spills on the floor as soon as it happens. Remove soap buildup in the tub or shower regularly. Attach bath mats securely with double-sided non-slip rug tape. Do not have throw rugs and other things on the floor that can make you trip. What can I do in the bedroom? Use night lights. Make sure that you have a light by your bed that is easy to reach. Do not use any sheets or blankets that are too big for your bed. They should not hang down onto the floor. Have a firm chair that has side arms. You can use this for support while you get dressed. Do not have throw rugs and other things on the floor that can make you trip. What can I do in the kitchen? Clean up any spills right away. Avoid walking on wet floors. Keep items that you use a lot in easy-to-reach places. If you need to reach something above you, use a strong step stool that has a grab bar. Keep electrical cords out of the way. Do not use floor polish or wax that makes floors slippery. If you must use wax, use non-skid floor wax. Do not have throw rugs and other things on the floor that can make you trip. What can I do with my stairs? Do not leave any items on the stairs. Make sure that there are handrails  on both sides of the stairs and use them. Fix handrails that are broken or loose. Make sure that handrails are as long as the stairways. Check any carpeting to make sure that it is firmly attached to the stairs. Fix any carpet that is loose or worn. Avoid having throw rugs at the top or bottom of the stairs. If you do have throw rugs, attach them to the floor with carpet tape. Make sure that you have a light switch at the top of the stairs and the bottom of the stairs. If you do not have them, ask someone to add them for you. What else can I do to help prevent falls? Wear shoes that: Do not  have high heels. Have rubber bottoms. Are comfortable and fit you well. Are closed at the toe. Do not wear sandals. If you use a stepladder: Make sure that it is fully opened. Do not climb a closed stepladder. Make sure that both sides of the stepladder are locked into place. Ask someone to hold it for you, if possible. Clearly mark and make sure that you can see: Any grab bars or handrails. First and last steps. Where the edge of each step is. Use tools that help you move around (mobility aids) if they are needed. These include: Canes. Walkers. Scooters. Crutches. Turn on the lights when you go into a dark area. Replace any light bulbs as soon as they burn out. Set up your furniture so you have a clear path. Avoid moving your furniture around. If any of your floors are uneven, fix them. If there are any pets around you, be aware of where they are. Review your medicines with your doctor. Some medicines can make you feel dizzy. This can increase your chance of falling. Ask your doctor what other things that you can do to help prevent falls. This information is not intended to replace advice given to you by your health care provider. Make sure you discuss any questions you have with your health care provider. Document Released: 02/25/2009 Document Revised: 10/07/2015 Document Reviewed: 06/05/2014 Elsevier Interactive Patient Education  2017 [employer].

## 2020-12-20 ENCOUNTER — Ambulatory Visit (INDEPENDENT_AMBULATORY_CARE_PROVIDER_SITE_OTHER): Payer: Medicare HMO | Admitting: Internal Medicine

## 2020-12-20 ENCOUNTER — Other Ambulatory Visit: Payer: Self-pay

## 2020-12-20 ENCOUNTER — Encounter: Payer: Self-pay | Admitting: Internal Medicine

## 2020-12-20 VITALS — BP 148/82 | HR 75 | Temp 98.2°F | Resp 16 | Ht 69.0 in | Wt 243.0 lb

## 2020-12-20 DIAGNOSIS — I10 Essential (primary) hypertension: Secondary | ICD-10-CM

## 2020-12-20 DIAGNOSIS — E1121 Type 2 diabetes mellitus with diabetic nephropathy: Secondary | ICD-10-CM

## 2020-12-20 DIAGNOSIS — Z0000 Encounter for general adult medical examination without abnormal findings: Secondary | ICD-10-CM

## 2020-12-20 DIAGNOSIS — E785 Hyperlipidemia, unspecified: Secondary | ICD-10-CM | POA: Diagnosis not present

## 2020-12-20 DIAGNOSIS — E781 Pure hyperglyceridemia: Secondary | ICD-10-CM

## 2020-12-20 DIAGNOSIS — M159 Polyosteoarthritis, unspecified: Secondary | ICD-10-CM

## 2020-12-20 LAB — BASIC METABOLIC PANEL
BUN: 18 mg/dL (ref 6–23)
CO2: 30 mEq/L (ref 19–32)
Calcium: 9.4 mg/dL (ref 8.4–10.5)
Chloride: 99 mEq/L (ref 96–112)
Creatinine, Ser: 0.98 mg/dL (ref 0.40–1.50)
GFR: 71.66 mL/min (ref >60.00)
Glucose, Bld: 126 mg/dL — ABNORMAL HIGH (ref 70–99)
Potassium: 4 mEq/L (ref 3.5–5.1)
Sodium: 137 mEq/L (ref 135–145)

## 2020-12-20 LAB — URINALYSIS, ROUTINE W REFLEX MICROSCOPIC
Bilirubin Urine: NEGATIVE
Hgb urine dipstick: NEGATIVE
Ketones, ur: NEGATIVE
Leukocytes,Ua: NEGATIVE
Nitrite: NEGATIVE
RBC / HPF: NONE SEEN (ref 0–?)
Specific Gravity, Urine: 1.01 (ref 1.000–1.030)
Urine Glucose: >=1000 — AB
Urobilinogen, UA: 0.2 (ref 0.0–1.0)
WBC, UA: NONE SEEN (ref 0–?)
pH: 6.5 (ref 5.0–8.0)

## 2020-12-20 LAB — MICROALBUMIN / CREATININE URINE RATIO
Creatinine,U: 53.7 mg/dL
Microalb Creat Ratio: 22.8 mg/g (ref 0.0–30.0)
Microalb, Ur: 12.3 mg/dL — ABNORMAL HIGH (ref 0.0–1.9)

## 2020-12-20 LAB — CBC WITH DIFFERENTIAL/PLATELET
Basophils Absolute: 0.1 10*3/uL (ref 0.0–0.1)
Basophils Relative: 0.9 % (ref 0.0–3.0)
Eosinophils Absolute: 0.1 10*3/uL (ref 0.0–0.7)
Eosinophils Relative: 1.3 % (ref 0.0–5.0)
HCT: 50.2 % (ref 39.0–52.0)
Hemoglobin: 16.9 g/dL (ref 13.0–17.0)
Lymphocytes Relative: 18.7 % (ref 12.0–46.0)
Lymphs Abs: 1.6 10*3/uL (ref 0.7–4.0)
MCHC: 33.7 g/dL (ref 30.0–36.0)
MCV: 88.8 fl (ref 78.0–100.0)
Monocytes Absolute: 0.8 10*3/uL (ref 0.1–1.0)
Monocytes Relative: 9.4 % (ref 3.0–12.0)
Neutro Abs: 6.1 10*3/uL (ref 1.4–7.7)
Neutrophils Relative %: 69.7 % (ref 43.0–77.0)
Platelets: 203 10*3/uL (ref 150.0–400.0)
RBC: 5.65 Mil/uL (ref 4.22–5.81)
RDW: 14.9 % (ref 11.5–15.5)
WBC: 8.7 10*3/uL (ref 4.0–10.5)

## 2020-12-20 LAB — LIPID PANEL
Cholesterol: 144 mg/dL (ref 0–200)
HDL: 37.2 mg/dL — ABNORMAL LOW (ref >39.00)
NonHDL: 107.09
Total CHOL/HDL Ratio: 4
Triglycerides: 203 mg/dL — ABNORMAL HIGH (ref 0.0–149.0)
VLDL: 40.6 mg/dL — ABNORMAL HIGH (ref 0.0–40.0)

## 2020-12-20 LAB — LDL CHOLESTEROL, DIRECT: Direct LDL: 89 mg/dL

## 2020-12-20 LAB — HEPATIC FUNCTION PANEL
ALT: 23 U/L (ref 0–53)
AST: 18 U/L (ref 0–37)
Albumin: 3.9 g/dL (ref 3.5–5.2)
Alkaline Phosphatase: 19 U/L — ABNORMAL LOW (ref 39–117)
Bilirubin, Direct: 0.1 mg/dL (ref 0.0–0.3)
Total Bilirubin: 0.6 mg/dL (ref 0.2–1.2)
Total Protein: 7.3 g/dL (ref 6.0–8.3)

## 2020-12-20 LAB — HEMOGLOBIN A1C: Hgb A1c MFr Bld: 6.7 % — ABNORMAL HIGH (ref 4.6–6.5)

## 2020-12-20 LAB — TSH: TSH: 1.34 u[IU]/mL (ref 0.35–5.50)

## 2020-12-20 MED ORDER — RYBELSUS 7 MG PO TABS: 1.0000 | ORAL_TABLET | Freq: Every day | ORAL | 1 refills | Status: DC

## 2020-12-20 MED ORDER — CELECOXIB 100 MG PO CAPS: 100.0000 mg | ORAL_CAPSULE | Freq: Two times a day (BID) | ORAL | 0 refills | Status: DC

## 2020-12-20 NOTE — Patient Instructions (Signed)
Health Maintenance, Male Adopting a healthy lifestyle and getting preventive care are important in promoting health and wellness. Ask your health care provider about: The right schedule for you to have regular tests and exams. Things you can do on your own to prevent diseases and keep yourself healthy. What should I know about diet, weight, and exercise? Eat a healthy diet  Eat a diet that includes plenty of vegetables, fruits, low-fat dairy products, and lean protein. Do not eat a lot of foods that are high in solid fats, added sugars, or sodium.  Maintain a healthy weight Body mass index (BMI) is a measurement that can be used to identify possible weight problems. It estimates body fat based on height and weight. Your health care provider can help determine your BMI and help you achieve or maintain ahealthy weight. Get regular exercise Get regular exercise. This is one of the most important things you can do for your health. Most adults should: Exercise for at least 150 minutes each week. The exercise should increase your heart rate and make you sweat (moderate-intensity exercise). Do strengthening exercises at least twice a week. This is in addition to the moderate-intensity exercise. Spend less time sitting. Even light physical activity can be beneficial. Watch cholesterol and blood lipids Have your blood tested for lipids and cholesterol at 83 years of age, then havethis test every 5 years. You may need to have your cholesterol levels checked more often if: Your lipid or cholesterol levels are high. You are older than 83 years of age. You are at high risk for heart disease. What should I know about cancer screening? Many types of cancers can be detected early and may often be prevented. Depending on your health history and family history, you may need to have cancer screening at various ages. This may include screening for: Colorectal cancer. Prostate cancer. Skin cancer. Lung  cancer. What should I know about heart disease, diabetes, and high blood pressure? Blood pressure and heart disease High blood pressure causes heart disease and increases the risk of stroke. This is more likely to develop in people who have high blood pressure readings, are of African descent, or are overweight. Talk with your health care provider about your target blood pressure readings. Have your blood pressure checked: Every 3-5 years if you are 18-39 years of age. Every year if you are 40 years old or older. If you are between the ages of 65 and 75 and are a current or former smoker, ask your health care provider if you should have a one-time screening for abdominal aortic aneurysm (AAA). Diabetes Have regular diabetes screenings. This checks your fasting blood sugar level. Have the screening done: Once every three years after age 45 if you are at a normal weight and have a low risk for diabetes. More often and at a younger age if you are overweight or have a high risk for diabetes. What should I know about preventing infection? Hepatitis B If you have a higher risk for hepatitis B, you should be screened for this virus. Talk with your health care provider to find out if you are at risk forhepatitis B infection. Hepatitis C Blood testing is recommended for: Everyone born from 1945 through 1965. Anyone with known risk factors for hepatitis C. Sexually transmitted infections (STIs) You should be screened each year for STIs, including gonorrhea and chlamydia, if: You are sexually active and are younger than 83 years of age. You are older than 83 years of age   and your health care provider tells you that you are at risk for this type of infection. Your sexual activity has changed since you were last screened, and you are at increased risk for chlamydia or gonorrhea. Ask your health care provider if you are at risk. Ask your health care provider about whether you are at high risk for HIV.  Your health care provider may recommend a prescription medicine to help prevent HIV infection. If you choose to take medicine to prevent HIV, you should first get tested for HIV. You should then be tested every 3 months for as long as you are taking the medicine. Follow these instructions at home: Lifestyle Do not use any products that contain nicotine or tobacco, such as cigarettes, e-cigarettes, and chewing tobacco. If you need help quitting, ask your health care provider. Do not use street drugs. Do not share needles. Ask your health care provider for help if you need support or information about quitting drugs. Alcohol use Do not drink alcohol if your health care provider tells you not to drink. If you drink alcohol: Limit how much you have to 0-2 drinks a day. Be aware of how much alcohol is in your drink. In the [place] one drink equals one 12 oz bottle of beer (355 mL), one 5 oz glass of wine (148 mL), or one 1 oz glass of hard liquor (44 mL). General instructions Schedule regular health, dental, and eye exams. Stay current with your vaccines. Tell your health care provider if: You often feel depressed. You have ever been abused or do not feel safe at home. Summary Adopting a healthy lifestyle and getting preventive care are important in promoting health and wellness. Follow your health care provider's instructions about healthy diet, exercising, and getting tested or screened for diseases. Follow your health care provider's instructions on monitoring your cholesterol and blood pressure. This information is not intended to replace advice given to you by your health care provider. Make sure you discuss any questions you have with your healthcare provider. Document Revised: 04/24/2018 Document Reviewed: 04/24/2018 Elsevier Patient Education  2022 Elsevier Inc.  

## 2020-12-20 NOTE — Progress Notes (Signed)
Subjective:  Patient ID: Matthew Villanueva, male    DOB: [birth year]  Age: 85 y.o. MRN: [id number]  CC: Annual Exam  This visit occurred during the SARS-CoV-2 public health emergency.  Safety protocols were in place, including screening questions prior to the visit, additional usage of staff PPE, and extensive cleaning of exam room while observing appropriate contact time as indicated for disinfecting solutions.    HPI Matthew Villanueva presents for a CPX and f/up -  He complains of chronic pain in his large joints and now more recently in his right hand.  He points to the right third MCP joint.  He denies any recent trauma or injury.  Tramadol was previously prescribed but he is not taking it.  He tells me he is getting some symptom relief with hot water and Tylenol.  He is active and denies any recent episodes of chest pain, shortness of breath, palpitations, dizziness, lightheadedness, or diaphoresis.  Outpatient Medications Prior to Visit  Medication Sig Dispense Refill   Ascorbic Acid (VITAMIN C PO) Take by mouth.     blood glucose meter kit and supplies KIT Use to test blood sugar up to twice daily. DX: E11.21 1 each 0   clotrimazole-betamethasone (LOTRISONE) cream Apply 1 application topically 2 (two) times daily.     Coenzyme Q10 (COQ10 PO) Take by mouth.     colchicine 0.6 MG tablet Take 0.6 mg by mouth daily.     empagliflozin (JARDIANCE) 25 MG TABS tablet Take 1 tablet (25 mg total) by mouth daily. 90 tablet 0   gabapentin (NEURONTIN) 100 MG capsule Take 100 mg by mouth 3 (three) times daily.     glucose blood (COOL BLOOD GLUCOSE TEST STRIPS) test strip Use to test blood sugar up to twice daily. DX: E11.21 100 each 12   Lancets (ONETOUCH ULTRASOFT) lancets Use to test blood sugar up to twice daily. DX: E11.21 100 each 12   losartan-hydrochlorothiazide (HYZAAR) 100-25 MG tablet TAKE ONE TABLET BY MOUTH DAILY 30 tablet 0   Multiple Vitamin (MULTIVITAMIN) tablet Take 1 tablet  by mouth daily.     Omega-3 Fatty Acids (FISH OIL PO) Take by mouth.     ONETOUCH VERIO test strip TEST BLOOD SUGAR TWO TIMES A DAY 150 strip 5   RYBELSUS 7 MG TABS TAKE ONE TABLET BY MOUTH DAILY 90 tablet 1   traMADol (ULTRAM) 50 MG tablet Take 1 tablet (50 mg total) by mouth every 6 (six) hours as needed for moderate pain. (Patient not taking: No sig reported) 90 tablet 3   No facility-administered medications prior to visit.    ROS Review of Systems  Constitutional:  Negative for chills, diaphoresis, fatigue and fever.  HENT: Negative.    Eyes: Negative.   Respiratory:  Negative for cough, chest tightness, shortness of breath and wheezing.   Cardiovascular:  Negative for chest pain, palpitations and leg swelling.  Gastrointestinal:  Negative for abdominal pain, constipation, diarrhea and vomiting.  Endocrine: Negative.   Genitourinary: Negative.  Negative for difficulty urinating.  Musculoskeletal:  Positive for arthralgias. Negative for myalgias.  Skin: Negative.   Neurological:  Negative for dizziness and weakness.  Hematological:  Negative for adenopathy. Does not bruise/bleed easily.  Psychiatric/Behavioral: Negative.     Objective:  BP (!) 148/82 (BP Location: Right Arm, Patient Position: Sitting, Cuff Size: Large)   Pulse 75   Temp 98.2 F (36.8 C) (Oral)   Resp 16   Ht _0  (1.753 m)  Wt 243 lb (110.2 kg)   SpO2 95%   BMI 35.88 kg/m   BP Readings from Last 3 Encounters:  12/20/20 (!) 148/82  12/15/20 120/60  08/16/20 (!) 144/66    Wt Readings from Last 3 Encounters:  12/20/20 243 lb (110.2 kg)  12/15/20 241 lb 3.2 oz (109.4 kg)  08/16/20 253 lb 2 oz (114.8 kg)    Physical Exam Vitals reviewed.  HENT:     Nose: Nose normal.     Mouth/Throat:     Mouth: Mucous membranes are moist.  Eyes:     Conjunctiva/sclera: Conjunctivae normal.  Cardiovascular:     Rate and Rhythm: Normal rate and regular rhythm. FrequentExtrasystoles are present.    Heart  sounds: Normal heart sounds, S1 normal and S2 normal.     Comments: EKG- NSR with frequent PVCs RBBB, inferior Q waves are old Pulmonary:     Breath sounds: No stridor. No wheezing, rhonchi or rales.  Abdominal:     General: Abdomen is flat.     Palpations: There is no mass.     Tenderness: There is no abdominal tenderness. There is no guarding.     Hernia: No hernia is present.  Musculoskeletal:        General: Normal range of motion.     Right lower leg: No edema.     Left lower leg: No edema.     Comments: Right third MCP joint shows degenerative changes with spurring.  There is no swelling, erythema, warmth, or joint effusion.  Skin:    General: Skin is warm and dry.  Neurological:     General: No focal deficit present.     Mental Status: He is alert.    Lab Results  Component Value Date   WBC 8.7 12/20/2020   HGB 16.9 12/20/2020   HCT 50.2 12/20/2020   PLT 203.0 12/20/2020   GLUCOSE 126 (H) 12/20/2020   CHOL 144 12/20/2020   TRIG 203.0 (H) 12/20/2020   HDL 37.20 (L) 12/20/2020   LDLDIRECT 89.0 12/20/2020   LDLCALC 81 12/16/2019   ALT 23 12/20/2020   AST 18 12/20/2020   NA 137 12/20/2020   K 4.0 12/20/2020   CL 99 12/20/2020   CREATININE 0.98 12/20/2020   BUN 18 12/20/2020   CO2 30 12/20/2020   TSH 1.34 12/20/2020   PSA 2.86 12/03/2017   HGBA1C 6.7 (H) 12/20/2020   MICROALBUR 12.3 (H) 12/20/2020    DG ABD ACUTE 2+V W 1V CHEST  Result Date: 11/25/2018 CLINICAL DATA:  Right flank pain for 5 days. History of hypertension and diabetes. EXAM: DG ABDOMEN ACUTE W/ 1V CHEST COMPARISON:  03/06/2016 FINDINGS: Small round calcification projects in the left mid abdomen, possibly within the lower pole of the left kidney. No other evidence of a renal stone. No evidence of a ureteral stone. Soft tissues otherwise unremarkable. Normal bowel gas pattern.  No free air. Cardiac silhouette is normal size. No mediastinal or hilar masses. No convincing adenopathy. Prominent lung  markings in the bases. Lungs otherwise clear. Elevated right hemidiaphragm. IMPRESSION: 1. No acute findings within the abdomen or pelvis. 2. Probable small nonobstructing stone in the lower pole the right kidney. No evidence of a ureteral stone. 3. No active cardiopulmonary disease. Electronically Signed   By: Lajean Manes M.D.   On: 11/25/2018 16:51    Assessment & Plan:   Faheem was seen today for annual exam.  Diagnoses and all orders for this visit:  Essential hypertension- His blood  pressure is well controlled.  His EKG is reassuring. -     EKG 12-Lead -     CBC with Differential/Platelet; Future -     Basic metabolic panel; Future -     Hepatic function panel; Future -     TSH; Future -     Urinalysis, Routine w reflex microscopic; Future -     Urinalysis, Routine w reflex microscopic -     TSH -     Hepatic function panel -     Basic metabolic panel -     CBC with Differential/Platelet  Type 2 diabetes mellitus with diabetic nephropathy, without long-term current use of insulin ([place])- His blood sugar is adequately well controlled.  Will continue the current glycemic agents. -     Basic metabolic panel; Future -     Hemoglobin A1c; Future -     Microalbumin / creatinine urine ratio; Future -     Microalbumin / creatinine urine ratio -     Hemoglobin A1c -     Basic metabolic panel -     Semaglutide (RYBELSUS) 7 MG TABS; Take 1 tablet by mouth daily.  Hyperlipidemia with target LDL less than 100- He is not willing to take a statin. -     Lipid panel; Future -     Hepatic function panel; Future -     TSH; Future -     Urinalysis, Routine w reflex microscopic; Future -     Urinalysis, Routine w reflex microscopic -     TSH -     Hepatic function panel -     Lipid panel  Routine health maintenance- Exam completed, labs reviewed, vaccines are up-to-date, no cancer screenings indicated, patient education was given.  Pure hyperglyceridemia  Generalized OA -     celecoxib  (CELEBREX) 100 MG capsule; Take 1 capsule (100 mg total) by mouth 2 (two) times daily.  Other orders -     LDL cholesterol, direct  I have discontinued Oshua C. Blacklock's traMADol. I have also changed his Rybelsus. Additionally, I am having him start on celecoxib. Lastly, I am having him maintain his onetouch ultrasoft, glucose blood, gabapentin, OneTouch Verio, Ascorbic Acid (VITAMIN C PO), Omega-3 Fatty Acids (FISH OIL PO), multivitamin, Coenzyme Q10 (COQ10 PO), blood glucose meter kit and supplies, empagliflozin, losartan-hydrochlorothiazide, clotrimazole-betamethasone, and colchicine.  Meds ordered this encounter  Medications   celecoxib (CELEBREX) 100 MG capsule    Sig: Take 1 capsule (100 mg total) by mouth 2 (two) times daily.    Dispense:  180 capsule    Refill:  0   Semaglutide (RYBELSUS) 7 MG TABS    Sig: Take 1 tablet by mouth daily.    Dispense:  90 tablet    Refill:  1      Follow-up: Return in about 6 months (around 06/22/2021).  Scarlette Calico, MD

## 2020-12-21 ENCOUNTER — Telehealth: Payer: Self-pay | Admitting: Pharmacist

## 2020-12-21 NOTE — Telephone Encounter (Signed)
Received signed paperwork from patient. Faxed applications for Rybelsus, Jardiance to respective companies.

## 2020-12-21 NOTE — Progress Notes (Signed)
    Chronic Care Management Pharmacy Assistant   Name: Matthew Villanueva  MRN: [id number] DOB: 01-Sep-1935  Pharmacist Review  Made a call to [name] to ask if he had picked up and completed his application for Rybelsus and Jardiance. The patient stated that he has felt it out and will drop by and drop off application today.  Ethelene Hal Clinical Pharmacist Assistant [phone]   Time spent:10

## 2020-12-24 ENCOUNTER — Telehealth: Payer: Self-pay | Admitting: Pharmacist

## 2020-12-24 NOTE — Progress Notes (Signed)
    Chronic Care Management Pharmacy Assistant   Name: Matthew Villanueva  MRN: [id number] DOB: Jul 08, 1935   Pharmacist Review  A call was made to [name] to check on the status of Matthew Villanueva patient assistance application for Rybelsus. Spoke with rep [name] who stated that the patient has been approved starting 12/23/20 through 05/14/21. A shipment has been sent out which will take 10-14 business delivery to Dr. Ronnald Ramp  office. Patient is on automatic refill, one refill left.  Also a call was made to [name] to check on the status of application for Jardiance. Spoke with rep [name] who states that the patient need to send in the denial letter so that the application can be processed. A letter was sent out today to the patient for denial letter to be mailed or fax.  [place] Pharmacist Assistant [phone]   Time spent:51

## 2020-12-26 ENCOUNTER — Other Ambulatory Visit: Payer: Self-pay | Admitting: Internal Medicine

## 2020-12-26 DIAGNOSIS — E1121 Type 2 diabetes mellitus with diabetic nephropathy: Secondary | ICD-10-CM

## 2020-12-26 DIAGNOSIS — I10 Essential (primary) hypertension: Secondary | ICD-10-CM

## 2021-01-07 DIAGNOSIS — K0530 Chronic periodontitis, unspecified: Secondary | ICD-10-CM | POA: Diagnosis not present

## 2021-01-13 NOTE — Progress Notes (Signed)
Contacted [facility]  to follow up on patient assistance application for Jardiance. Per representative at [facility] cares states  patient needs to send in LIS denial letter.  Orinda Kenner, [place] Clinical Pharmacists Assistant [phone]  Time Spent: 59

## 2021-01-24 ENCOUNTER — Telehealth: Payer: Self-pay | Admitting: Pharmacist

## 2021-01-24 NOTE — Progress Notes (Signed)
Chronic Care Management Pharmacy Assistant   Name: Matthew Villanueva  MRN: [id number] DOB: 11-08-1935   Reason for Encounter: Disease State   Conditions to be addressed/monitored: HTN   Recent office visits:  12/20/20 Matthew Lima, MD-Internal Medicine (Essential hypertension) discontinued traMADol, changed his Rybelsus changed his Rybelsus, start celecoxib   Recent consult visits:  None ID  Hospital visits:  None in previous 6 months  Medications: Outpatient Encounter Medications as of 01/24/2021  Medication Sig   Ascorbic Acid (VITAMIN C PO) Take by mouth.   blood glucose meter kit and supplies KIT Use to test blood sugar up to twice daily. DX: E11.21   celecoxib (CELEBREX) 100 MG capsule Take 1 capsule (100 mg total) by mouth 2 (two) times daily.   clotrimazole-betamethasone (LOTRISONE) cream Apply 1 application topically 2 (two) times daily.   Coenzyme Q10 (COQ10 PO) Take by mouth.   colchicine 0.6 MG tablet Take 0.6 mg by mouth daily.   empagliflozin (JARDIANCE) 25 MG TABS tablet Take 1 tablet (25 mg total) by mouth daily.   gabapentin (NEURONTIN) 100 MG capsule Take 100 mg by mouth 3 (three) times daily.   glucose blood (COOL BLOOD GLUCOSE TEST STRIPS) test strip Use to test blood sugar up to twice daily. DX: E11.21   Lancets (ONETOUCH ULTRASOFT) lancets Use to test blood sugar up to twice daily. DX: E11.21   losartan-hydrochlorothiazide (HYZAAR) 100-25 MG tablet TAKE ONE TABLET BY MOUTH DAILY   Multiple Vitamin (MULTIVITAMIN) tablet Take 1 tablet by mouth daily.   Omega-3 Fatty Acids (FISH OIL PO) Take by mouth.   ONETOUCH VERIO test strip TEST BLOOD SUGAR TWO TIMES A DAY   Semaglutide (RYBELSUS) 7 MG TABS Take 1 tablet by mouth daily.   No facility-administered encounter medications on file as of 01/24/2021.    Recent Office Vitals: BP Readings from Last 3 Encounters:  12/20/20 (!) 148/82  12/15/20 120/60  08/16/20 (!) 144/66   Pulse Readings from Last  3 Encounters:  12/20/20 75  12/15/20 74  08/16/20 77    Wt Readings from Last 3 Encounters:  12/20/20 243 lb (110.2 kg)  12/15/20 241 lb 3.2 oz (109.4 kg)  08/16/20 253 lb 2 oz (114.8 kg)     Kidney Function Lab Results  Component Value Date/Time   CREATININE 0.98 12/20/2020 09:43 AM   CREATININE 0.92 03/30/2020 10:15 AM   CREATININE 1.03 12/16/2019 11:40 AM   GFR 71.66 12/20/2020 09:43 AM   GFRNONAA 68 12/16/2019 11:40 AM   GFRAA 79 12/16/2019 11:40 AM    BMP Latest Ref Rng & Units 12/20/2020 03/30/2020 12/16/2019  Glucose 70 - 99 mg/dL 126(H) 125(H) 175(H)  BUN 6 - 23 mg/dL $Remove'18 15 20  'hPhPgQp$ Creatinine 0.40 - 1.50 mg/dL 0.98 0.92 1.03  BUN/Creat Ratio 6 - 22 (calc) - - NOT APPLICABLE  Sodium 364 - 145 mEq/L 137 136 136  Potassium 3.5 - 5.1 mEq/L 4.0 3.5 4.5  Chloride 96 - 112 mEq/L 99 100 100  CO2 19 - 32 mEq/L $Remove'30 28 26  'JxvGYre$ Calcium 8.4 - 10.5 mg/dL 9.4 8.8 9.9     Contacted patient on 01/24/21 to discuss hypertension disease state  Current antihypertensive regimen:  Losartan-hctz 100-25 mg 1 tab daily  Patient verbally confirms he is taking the above medications as directed. Yes  How often are you checking your Blood Pressure? infrequently  he checks his blood pressure in the morning before taking his medication.  Current home BP readings: Patient states  that he does not check blood pressure at home  Wrist or arm cuff:NA Caffeine intake:drinks about a cup of coffee a day Salt intake:very little salt OTC medications including pseudoephedrine or NSAIDs?none   What recent interventions/DTPs have been made by any provider to improve Blood Pressure control since last CPP Visit: none ID  Any recent hospitalizations or ED visits since last visit with CPP? No  What diet changes have been made to improve Blood Pressure Control?  Patient states that he does not eat much sugar and carbs  What exercise is being done to improve your Blood Pressure Control?  Patient states that he  does not exercise  Adherence Review: Is the patient currently on ACE/ARB medication? Yes Does the patient have >5 day gap between last estimated fill dates? No   Star Rating Drugs:  Medication:  Last Fill: Day Supply Losartan-hctz  12/26/20 90   Care Gaps: Annual wellness visit in last year? Yes Most Recent BP reading:148/82  If Diabetic: Most recent A1C reading:7 03-30-20 Last eye exam / retinopathy screening:12/02/19 Last diabetic foot exam:03/30/20   Cardiology appointment on 02/25/21    [place] Pharmacist Assistant [phone]   Time spent:21

## 2021-02-01 ENCOUNTER — Other Ambulatory Visit: Payer: Self-pay

## 2021-02-01 ENCOUNTER — Ambulatory Visit (INDEPENDENT_AMBULATORY_CARE_PROVIDER_SITE_OTHER): Payer: Medicare HMO | Admitting: Emergency Medicine

## 2021-02-01 ENCOUNTER — Encounter: Payer: Self-pay | Admitting: Emergency Medicine

## 2021-02-01 VITALS — BP 124/76 | HR 93 | Ht 69.0 in | Wt 239.0 lb

## 2021-02-01 DIAGNOSIS — Z23 Encounter for immunization: Secondary | ICD-10-CM

## 2021-02-01 DIAGNOSIS — N481 Balanitis: Secondary | ICD-10-CM

## 2021-02-01 DIAGNOSIS — H6123 Impacted cerumen, bilateral: Secondary | ICD-10-CM | POA: Diagnosis not present

## 2021-02-01 MED ORDER — CLOTRIMAZOLE-BETAMETHASONE 1-0.05 % EX CREA: 1.0000 "application " | TOPICAL_CREAM | Freq: Two times a day (BID) | CUTANEOUS | 3 refills | Status: DC

## 2021-02-01 NOTE — Patient Instructions (Signed)
Balanitis Balanitis is swelling and irritation of the head of the penis (glans penis). Balanitis occurs most often among males who have not had their foreskin removed (uncircumcised). In uncircumcised males, the condition may also cause inflammation of the skin around the foreskin. Balanitis sometimes causes scarring of the penis or foreskin, which can require surgery. This condition may develop because of an infection or another medical condition. Untreated balanitis can increase the risk of penile cancer. What are the causes? Common causes of this condition include: Poor personal hygiene, especially in uncircumcised males. Not cleaning the glans penis and foreskin well can result in a buildup of bacteria, viruses, and yeast, which can lead to infection and inflammation. Irritation and lack of air flow due to fluid (smegma) that can build up on the glans penis. Other causes include: Chemical irritation from products such as soaps or shower gels, especially those that have fragrance. Chemical irritation can also be caused by condoms, personal lubricants, petroleum jelly, spermicides, or fabric softeners. Skin conditions, such as eczema, dermatitis, and psoriasis. Allergies to medicines, such as tetracycline and sulfa drugs. What increases the risk? The following factors may make you more likely to develop this condition: Being an uncircumcised male. Having diabetes. Having other medical conditions, including liver cirrhosis, congestive heart failure, or kidney disease. Having infections, such as candidiasis, HPV (human papillomavirus), herpes simplex, gonorrhea, or syphilis. Having a tight foreskin that is difficult to pull back (retract) past the glans penis. Being severely obese. What are the signs or symptoms? Symptoms of this condition include: Discharge from under the foreskin, and pain or difficulty retracting the foreskin. A bad smell or itchiness on the penis. Tenderness, redness, and  swelling of the glans penis. A rash or sores on the glans penis or foreskin. Inability to get an erection due to pain. Difficulty urinating. Scarring of the penis or foreskin, in some cases. How is this diagnosed? This condition may be diagnosed based on a physical exam and tests of a swab of discharge to check for bacterial or fungal infection. You may also have blood tests to check for: Viruses that can cause balanitis. A high blood sugar (glucose) level. This could be a sign of diabetes, which can increase the risk of balanitis. How is this treated? Treatment for balanitis depends on the cause. Treatment may include: Improving personal hygiene. Your health care provider may recommend sitting in a bath of warm water that is deep enough to cover your hips and buttocks (sitz bath). Taking medicines such as: Creams or ointments to reduce swelling (steroids) or to treat an infection. Antibiotic medicine. Antifungal medicine. Having surgery to remove or cut the foreskin (circumcision). This may be done if you have scarring on the foreskin that makes it difficult to retract. Controlling other medical problems that may be causing your condition or making it worse. Follow these instructions at home: Medicines Take over-the-counter and prescription medicines only as told by your health care provider. If you were prescribed an antibiotic medicine or a cream or ointment, use it as told by your health care provider. Do not stop using your medicine, cream, or ointment even if you start to feel better. General instructions Do not have sex until the condition clears up, or until your health care provider approves. Keep your penis clean and dry. Take sitz baths as recommended by your health care provider. Avoid products that irritate your skin or make symptoms worse, such as soaps and shower gels that have fragrance. Keep all follow-up visits   as told by your health care provider. This is  important. Contact a health care provider if: Your symptoms get worse or do not improve with home care. You develop chills or a fever. You have trouble urinating. You cannot retract your foreskin. Get help right away if: You develop severe pain. You are unable to urinate. Summary Balanitis is inflammation of the head of the penis (glans penis) caused by irritation or infection. This condition is most common among uncircumcised males. Balanitis causes pain, redness, and swelling of the glans penis. Good personal hygiene is important. Treatment may include improving personal hygiene and applying creams or ointments. Contact a health care provider if your symptoms get worse or do not improve with home care. This information is not intended to replace advice given to you by your health care provider. Make sure you discuss any questions you have with your health care provider. Document Revised: 02/19/2019 Document Reviewed: 02/19/2019 Elsevier Patient Education  2022 [employer]  

## 2021-02-01 NOTE — Progress Notes (Signed)
Matthew Villanueva 85 y.o.   Chief Complaint  Patient presents with   Cerumen Impaction    Pt states he also has a yeast infection x 2 wks    HISTORY OF PRESENT ILLNESS: This is a 85 y.o. male complaining of bilateral ear wax buildup Also complaining of yeast infection to his penis.  Has history of diabetes and takes Jardiance among his medications. Has been on Jardiance for 2 years.  First episode of balanitis. Also has history of psoriasis with atypical lesion to his lower back.  HPI   Prior to Admission medications   Medication Sig Start Date End Date Taking? Authorizing Provider  Ascorbic Acid (VITAMIN C PO) Take by mouth.   Yes [provider]  blood glucose meter kit and supplies KIT Use to test blood sugar up to twice daily. DX: E11.21 06/14/20  Yes Etta Grandchild, MD  celecoxib (CELEBREX) 100 MG capsule Take 1 capsule (100 mg total) by mouth 2 (two) times daily. 12/20/20  Yes Etta Grandchild, MD  clotrimazole-betamethasone (LOTRISONE) cream Apply 1 application topically 2 (two) times daily.   Yes [provider]  Coenzyme Q10 (COQ10 PO) Take by mouth.   Yes [provider]  colchicine 0.6 MG tablet Take 0.6 mg by mouth daily.   Yes [provider]  empagliflozin (JARDIANCE) 25 MG TABS tablet Take 1 tablet (25 mg total) by mouth daily. 11/23/20  Yes Etta Grandchild, MD  gabapentin (NEURONTIN) 100 MG capsule Take 100 mg by mouth 3 (three) times daily.   Yes [provider]  glucose blood (COOL BLOOD GLUCOSE TEST STRIPS) test strip Use to test blood sugar up to twice daily. DX: E11.21 06/01/17  Yes Etta Grandchild, MD  Lancets [facility] ULTRASOFT) lancets Use to test blood sugar up to twice daily. DX: E11.21 06/01/17  Yes Etta Grandchild, MD  losartan-hydrochlorothiazide [facility]) 100-25 MG tablet TAKE ONE TABLET BY MOUTH DAILY 12/26/20  Yes Etta Grandchild, MD  Multiple Vitamin (MULTIVITAMIN) tablet Take 1 tablet by mouth daily.   Yes  [provider]  Omega-3 Fatty Acids (FISH OIL PO) Take by mouth.   Yes [provider]  ONETOUCH VERIO test strip TEST BLOOD SUGAR TWO TIMES A DAY 03/20/20  Yes Etta Grandchild, MD  Semaglutide (RYBELSUS) 7 MG TABS Take 1 tablet by mouth daily. 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 04/15/2019   Pure hyperglyceridemia 11/09/2015   OSA (obstructive sleep apnea) 11/09/2015   Statin intolerance 06/10/2014   Chronic painful diabetic neuropathy (HCC) 12/24/2013   Other male erectile dysfunction 12/24/2013   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   Hyperlipidemia with target LDL less than 100 08/12/2013   BPH associated with nocturia 08/12/2013   Morbid obesity (HCC) 05/24/2011   Routine health maintenance 05/24/2011   HEARING LOSS 04/28/2009   DM (diabetes mellitus), 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   DJD of shoulder 07/23/2007  Gout 06/25/2007   Essential hypertension 06/25/2007    Past Medical History:  Diagnosis Date   Arthritis    Diabetes mellitus    NO INSULIN   Dyspnea    Gout    History of colonic polyps    HTN (hypertension)    Localized osteoarthrosis not specified whether primary or secondary, hand    OSA (obstructive sleep apnea)    Other specified cardiac dysrhythmias(427.89) 2004   ABLATION DUE TO TACHYCARDIA   Right bundle branch block     Past Surgical History:  Procedure Laterality Date   CARDIAC ELECTROPHYSIOLOGY STUDY AND ABLATION  2004   ablation for SVT with a  chronic right bundle branch block    HEMORRHOID SURGERY  1960   PILONIDAL CYST DRAINAGE      Social History   Socioeconomic History   Marital status: Single    Spouse name: Not on file   Number of children: Not on file   Years of education: 16   Highest education level: Not on file  Occupational History   Occupation: self employed, Chemical engineer: SELF-EMPLOYED  Tobacco Use   Smoking status: Former    Packs/day: 2.00    Years: 30.00    Pack years: 60.00    Types: Cigarettes    Quit date: 05/15/1984    Years since quitting: 36.7   Smokeless tobacco: Never  Vaping Use   Vaping Use: Never used  Substance and Sexual Activity   Alcohol use: Yes    Comment: Rarely   Drug use: No   Sexual activity: Not Currently  Other Topics Concern   Not on file  Social History Narrative   HSG, [place]   Single   Work: self employed...Recruitment consultant   I-ADLs   End-of-Life: Yes-CPR, Yes-short-term Mechanical Ventilation, but no prolonged ventilation. Is willing to undergo dialysis, prolonged tub feeding.   Pt states former smoker. 1986. 2ppd. Started age 36   Social Determinants of Health   Financial Resource Strain: Low Risk    Difficulty of Paying Living Expenses: Not hard at all  Food Insecurity: No Food Insecurity   Worried About Charity fundraiser in the Last Year: Never true   Arboriculturist in the Last Year: Never true  Transportation Needs: No Transportation Needs   Lack of Transportation (Medical): No   Lack of Transportation (Non-Medical): No  Physical Activity: Inactive   Days of Exercise per Week: 0 days   Minutes of Exercise per Session: 0 min  Stress: No Stress Concern Present   Feeling of Stress : Not at all  Social Connections: Moderately Integrated   Frequency of Communication with Friends and Family: More than three times a week   Frequency of Social Gatherings with Friends and Family: More than three times a week   Attends Religious Services:  1 to 4 times per year   Active Member of [employer] or Organizations: Yes   Attends Archivist Meetings: 1 to 4 times per year   Marital Status: Never married  Human resources officer Violence: Not At Risk   Fear of Current or Ex-Partner: No   Emotionally Abused: No   Physically Abused: No   Sexually Abused: No    Family History  Problem Relation Age of Onset   Cancer Father        throat and tongue   Heart disease Mother    Lung cancer Sister    Diabetes Maternal Uncle  Heart attack Maternal Grandmother    Heart disease Maternal Grandmother        CAD/MI   Heart disease Maternal Grandfather    Heart disease Maternal Aunt        CAD     Review of Systems  Constitutional:  Negative for chills and fever.  HENT:  Positive for hearing loss. Negative for congestion and sore throat.   Respiratory:  Negative for cough and shortness of breath.   Cardiovascular:  Negative for chest pain and palpitations.  Gastrointestinal:  Negative for abdominal pain, nausea and vomiting.  Genitourinary: Negative.  Negative for dysuria and hematuria.  Skin:  Positive for rash.  Neurological: Negative.  Negative for dizziness and headaches.   Today's Vitals   02/01/21 1004  BP: 124/76  Pulse: 93  SpO2: 96%  Weight: 239 lb (108.4 kg)  Height: $Remove'5\' 9"'AsQvdph$  (1.753 m)   Body mass index is 35.29 kg/m.  Physical Exam Vitals reviewed.  Constitutional:      Appearance: Normal appearance.  HENT:     Head: Normocephalic.     Right Ear: There is impacted cerumen.     Left Ear: There is impacted cerumen.  Cardiovascular:     Rate and Rhythm: Normal rate.  Pulmonary:     Effort: Pulmonary effort is normal.  Genitourinary:    Penis: Uncircumcised.      Testes: Normal.     Epididymis:     Right: Normal.     Left: Normal.     Comments: Uncircumcised.  Erythema to glans penis and foreskin compatible with balanitis Lymphadenopathy:     Lower Body: No right inguinal adenopathy. No left inguinal  adenopathy.  Skin:    General: Skin is warm and dry.     Findings: Rash present.     Comments: Psoriatic patch to lower back  Neurological:     General: No focal deficit present.     Mental Status: He is alert and oriented to person, place, and time.   Procedure note: Ceruminosis is noted.  Wax is removed by syringing and manual debridement. Instructions for home care to prevent wax buildup are given.  Tolerated procedure well.  No complications.  TMs intact after procedure.  ASSESSMENT & PLAN: Marteze was seen today for cerumen impaction.  Diagnoses and all orders for this visit:  Bilateral impacted cerumen -     Ear Lavage  Balanitis -     clotrimazole-betamethasone (LOTRISONE) cream; Apply 1 application topically 2 (two) times daily.  Need for influenza vaccination -     Flu Vaccine QUAD High Dose(Fluad)  Patient Instructions  Balanitis Balanitis is swelling and irritation of the head of the penis (glans penis). Balanitis occurs most often among males who have not had their foreskin removed (uncircumcised). In uncircumcised males, the condition may also cause inflammation of the skin around the foreskin. Balanitis sometimes causes scarring of the penis or foreskin, which can require surgery. This condition may develop because of an infection or another medical condition. Untreated balanitis can increase the risk of penile cancer. What are the causes? Common causes of this condition include: Poor personal hygiene, especially in uncircumcised males. Not cleaning the glans penis and foreskin well can result in a buildup of bacteria, viruses, and yeast, which can lead to infection and inflammation. Irritation and lack of air flow due to fluid (smegma) that can build up on the glans penis. Other causes include: Chemical irritation from products such as soaps or shower gels, especially those  that have fragrance. Chemical irritation can also be caused by condoms, personal lubricants,  petroleum jelly, spermicides, or fabric softeners. Skin conditions, such as eczema, dermatitis, and psoriasis. Allergies to medicines, such as tetracycline and sulfa drugs. What increases the risk? The following factors may make you more likely to develop this condition: Being an uncircumcised male. Having diabetes. Having other medical conditions, including liver cirrhosis, congestive heart failure, or kidney disease. Having infections, such as candidiasis, HPV (human papillomavirus), herpes simplex, gonorrhea, or syphilis. Having a tight foreskin that is difficult to pull back (retract) past the glans penis. Being severely obese. What are the signs or symptoms? Symptoms of this condition include: Discharge from under the foreskin, and pain or difficulty retracting the foreskin. A bad smell or itchiness on the penis. Tenderness, redness, and swelling of the glans penis. A rash or sores on the glans penis or foreskin. Inability to get an erection due to pain. Difficulty urinating. Scarring of the penis or foreskin, in some cases. How is this diagnosed? This condition may be diagnosed based on a physical exam and tests of a swab of discharge to check for bacterial or fungal infection. You may also have blood tests to check for: Viruses that can cause balanitis. A high blood sugar (glucose) level. This could be a sign of diabetes, which can increase the risk of balanitis. How is this treated? Treatment for balanitis depends on the cause. Treatment may include: Improving personal hygiene. Your health care provider may recommend sitting in a bath of warm water that is deep enough to cover your hips and buttocks (sitz bath). Taking medicines such as: Creams or ointments to reduce swelling (steroids) or to treat an infection. Antibiotic medicine. Antifungal medicine. Having surgery to remove or cut the foreskin (circumcision). This may be done if you have scarring on the foreskin that makes  it difficult to retract. Controlling other medical problems that may be causing your condition or making it worse. Follow these instructions at home: Medicines Take over-the-counter and prescription medicines only as told by your health care provider. If you were prescribed an antibiotic medicine or a cream or ointment, use it as told by your health care provider. Do not stop using your medicine, cream, or ointment even if you start to feel better. General instructions Do not have sex until the condition clears up, or until your health care provider approves. Keep your penis clean and dry. Take sitz baths as recommended by your health care provider. Avoid products that irritate your skin or make symptoms worse, such as soaps and shower gels that have fragrance. Keep all follow-up visits as told by your health care provider. This is important. Contact a health care provider if: Your symptoms get worse or do not improve with home care. You develop chills or a fever. You have trouble urinating. You cannot retract your foreskin. Get help right away if: You develop severe pain. You are unable to urinate. Summary Balanitis is inflammation of the head of the penis (glans penis) caused by irritation or infection. This condition is most common among uncircumcised males. Balanitis causes pain, redness, and swelling of the glans penis. Good personal hygiene is important. Treatment may include improving personal hygiene and applying creams or ointments. Contact a health care provider if your symptoms get worse or do not improve with home care. This information is not intended to replace advice given to you by your health care provider. Make sure you discuss any questions you have with your health  care provider. Document Revised: 02/19/2019 Document Reviewed: 02/19/2019 Elsevier Patient Education  2022 [place], MD [facility]

## 2021-02-21 NOTE — Progress Notes (Signed)
Cardiology Office Note    Date:  02/25/2021   ID:  Matthew Villanueva, DOB 03-03-1936, MRN [id number]  PCP:  [name], MD  Cardiologist:  Dr. Johnsie Cancel   CC:  Nausea   History of Present Illness:  Matthew Villanueva is a 85 y.o. 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 and  A lot on line   On Jardiance now in addition to Rybelsus A1c better 8.3 -> 6.7 on 12/20/20  Had first case of Bananitis September 2022 Rx with Lotrisone   No cardiac symptoms   Getting ready for [employer] to get to his shows in [place]   Past Medical History:  Diagnosis Date   Arthritis    Diabetes mellitus    NO INSULIN   Dyspnea    Gout    History of colonic polyps    HTN (hypertension)    Localized osteoarthrosis not specified whether primary or secondary, hand    OSA (obstructive sleep apnea)    Other specified cardiac dysrhythmias(427.89) 2004   ABLATION DUE TO TACHYCARDIA   Right bundle branch block     Past Surgical History:  Procedure Laterality Date   CARDIAC ELECTROPHYSIOLOGY STUDY AND ABLATION  2004   ablation for SVT with a chronic right bundle branch block    HEMORRHOID SURGERY  1960   PILONIDAL CYST DRAINAGE      Current Medications: Outpatient Medications Prior to Visit  Medication Sig Dispense Refill   Ascorbic Acid (VITAMIN C PO) Take by mouth.     blood glucose meter kit and supplies KIT Use to test blood sugar up to twice daily. DX: E11.21 1 each 0   celecoxib (CELEBREX) 100 MG capsule Take 1 capsule (100 mg total) by mouth 2 (two) times daily. 180 capsule 0   clotrimazole-betamethasone (LOTRISONE) cream Apply 1 application topically 2  (two) times daily. 30 g 3   Coenzyme Q10 (COQ10 PO) Take by mouth.     colchicine 0.6 MG tablet Take 0.6 mg by mouth daily.     empagliflozin (JARDIANCE) 25 MG TABS tablet Take 1 tablet (25 mg total) by mouth daily. 90 tablet 1   gabapentin (NEURONTIN) 100 MG capsule Take 100 mg by mouth 3 (three) times daily.     glucose blood (COOL BLOOD GLUCOSE TEST STRIPS) test strip Use to test blood sugar up to twice daily. DX: E11.21 100 each 12   Lancets (ONETOUCH ULTRASOFT) lancets Use to test blood sugar up to twice daily. DX: E11.21 100 each 12   losartan-hydrochlorothiazide (HYZAAR) 100-25 MG tablet TAKE ONE TABLET BY MOUTH DAILY 90 tablet 1   Multiple Vitamin (MULTIVITAMIN) tablet Take 1 tablet by mouth daily.     Omega-3 Fatty Acids (FISH OIL PO) Take by mouth.     ONETOUCH VERIO test strip TEST BLOOD SUGAR TWO TIMES A DAY 150 strip 5   Semaglutide (RYBELSUS) 7 MG TABS Take 1 tablet by mouth daily. 90 tablet 1   No facility-administered medications prior to visit.     Allergies:   Livalo [pitavastatin], Crestor [rosuvastatin], Metformin and related, and Pravastatin   Social History   Socioeconomic  History   Marital status: Single    Spouse name: Not on file   Number of children: Not on file   Years of education: 16   Highest education level: Not on file  Occupational History   Occupation: self employed, Chemical engineer: SELF-EMPLOYED  Tobacco Use   Smoking status: Former    Packs/day: 2.00    Years: 30.00    Pack years: 60.00    Types: Cigarettes    Quit date: 05/15/1984    Years since quitting: 36.8   Smokeless tobacco: Never  Vaping Use   Vaping Use: Never used  Substance and Sexual Activity   Alcohol use: Yes    Comment: Rarely   Drug use: No   Sexual activity: Not Currently  Other Topics Concern   Not on file  Social History Narrative   HSG, [place]   Single   Work: self employed...Recruitment consultant   I-ADLs   End-of-Life: Yes-CPR, Yes-short-term  Mechanical Ventilation, but no prolonged ventilation. Is willing to undergo dialysis, prolonged tub feeding.   Pt states former smoker. 1986. 2ppd. Started age 30   Social Determinants of Health   Financial Resource Strain: Low Risk    Difficulty of Paying Living Expenses: Not hard at all  Food Insecurity: No Food Insecurity   Worried About Charity fundraiser in the Last Year: Never true   Arboriculturist in the Last Year: Never true  Transportation Needs: No Transportation Needs   Lack of Transportation (Medical): No   Lack of Transportation (Non-Medical): No  Physical Activity: Inactive   Days of Exercise per Week: 0 days   Minutes of Exercise per Session: 0 min  Stress: No Stress Concern Present   Feeling of Stress : Not at all  Social Connections: Moderately Integrated   Frequency of Communication with Friends and Family: More than three times a week   Frequency of Social Gatherings with Friends and Family: More than three times a week   Attends Religious Services: 1 to 4 times per year   Active Member of [employer] or Organizations: Yes   Attends Archivist Meetings: 1 to 4 times per year   Marital Status: Never married     Family History:  The patient's family history includes Cancer in his father; Diabetes in his maternal uncle; Heart attack in his maternal grandmother; Heart disease in his maternal aunt, maternal grandfather, maternal grandmother, and mother; Lung cancer in his sister.     ROS:   Please see the history of present illness.    ROS All other systems reviewed and are negative.   PHYSICAL EXAM:   VS:  BP 132/74   Pulse 75   Ht $R'5\' 10"'ef$  (1.778 m)   Wt 242 lb (109.8 kg)   SpO2 95%   BMI 34.72 kg/m    Affect appropriate Overweight white male  HEENT: normal Neck supple with no adenopathy JVP normal no bruits no thyromegaly Lungs clear with no wheezing and good diaphragmatic motion Heart:  S1/S2 no murmur, no rub, gallop or click PMI  normal Abdomen: benighn, BS positve, no tenderness, no AAA no bruit.  No HSM or HJR Distal pulses intact with no bruits No edema Neuro non-focal Skin warm and dry No muscular weakness  Wt Readings from Last 3 Encounters:  02/25/21 242 lb (109.8 kg)  02/01/21 239 lb (108.4 kg)  12/20/20 243 lb (110.2 kg)      Studies/Labs Reviewed:   EKG:  03/01/20 SR RBBB PAC/PVCls   Recent Labs: 12/20/2020: ALT 23; BUN 18; Creatinine, Ser 0.98; Hemoglobin 16.9; Platelets 203.0; Potassium 4.0; Sodium 137; TSH 1.34   Lipid Panel    Component Value Date/Time   CHOL 144 12/20/2020 0943   TRIG 203.0 (H) 12/20/2020 0943   HDL 37.20 (L) 12/20/2020 0943   CHOLHDL 4 12/20/2020 0943   VLDL 40.6 (H) 12/20/2020 0943   LDLCALC 81 12/16/2019 1140   LDLDIRECT 89.0 12/20/2020 0943    Additional studies/ records that were reviewed today include:  ETT/Myoview 04/2013 Conclusions PACs develop during recovery only. No evidence of exercise induced ischemia. Hypertensive response to exercise , otherwise normal stress test.   ASSESSMENT & PLAN:   SOB: Improved normal echo and exam doubt cardiac etiology    PAC;s : Benign in setting of non ischemic myovue and normal EF   HTN: Well controlled.  Continue current medications and low sodium Dash type diet.    RBBB: this has been stable over time. Yearly ECG   SVT s/p ablation: no recent palpitations .  DMT2: better on Jardiance A1c 6.7 He has had some Balanitis RX with Lotrisone   OSA: continue CPAP  F/U in a year   Jenkins Rouge, MD

## 2021-02-23 ENCOUNTER — Other Ambulatory Visit: Payer: Self-pay | Admitting: Internal Medicine

## 2021-02-23 DIAGNOSIS — Z794 Long term (current) use of insulin: Secondary | ICD-10-CM

## 2021-02-23 DIAGNOSIS — E1121 Type 2 diabetes mellitus with diabetic nephropathy: Secondary | ICD-10-CM

## 2021-02-23 MED ORDER — EMPAGLIFLOZIN 25 MG PO TABS: 25.0000 mg | ORAL_TABLET | Freq: Every day | ORAL | 1 refills | Status: DC

## 2021-02-25 ENCOUNTER — Other Ambulatory Visit: Payer: Self-pay

## 2021-02-25 ENCOUNTER — Ambulatory Visit: Payer: Medicare HMO | Admitting: Cardiovascular Disease

## 2021-02-25 ENCOUNTER — Encounter: Payer: Self-pay | Admitting: Cardiovascular Disease

## 2021-02-25 VITALS — BP 132/74 | HR 75 | Ht 70.0 in | Wt 242.0 lb

## 2021-02-25 DIAGNOSIS — R0602 Shortness of breath: Secondary | ICD-10-CM | POA: Diagnosis not present

## 2021-02-25 DIAGNOSIS — I10 Essential (primary) hypertension: Secondary | ICD-10-CM

## 2021-02-25 DIAGNOSIS — I4510 Unspecified right bundle-branch block: Secondary | ICD-10-CM

## 2021-02-25 NOTE — Patient Instructions (Signed)
Medication Instructions:  *If you need a refill on your cardiac medications before your next appointment, please call your pharmacy*  Lab Work: If you have labs (blood work) drawn today and your tests are completely normal, you will receive your results only by: [place] (if you have MyChart) OR A paper copy in the mail If you have any lab test that is abnormal or we need to change your treatment, we will call you to review the results.  Testing/Procedures: None ordered today.  Follow-Up: At [facility], you and your health needs are our priority.  As part of our continuing mission to provide you with exceptional heart care, we have created designated Provider Care Teams.  These Care Teams include your primary Cardiologist (physician) and Advanced Practice Providers (APPs -  Physician Assistants and Nurse Practitioners) who all work together to provide you with the care you need, when you need it.  We recommend signing up for the patient portal called "MyChart".  Sign up information is provided on this After Visit Summary.  MyChart is used to connect with patients for Virtual Visits (Telemedicine).  Patients are able to view lab/test results, encounter notes, upcoming appointments, etc.  Non-urgent messages can be sent to your provider as well.   To learn more about what you can do with MyChart, go to [email or web address].    Your next appointment:   1 year(s)  The format for your next appointment:   In Person  Provider:   You may see Dr. Johnsie Cancel or one of the following Advanced Practice Providers on your designated Care Team:   Cecilie Kicks, NP

## 2021-03-16 ENCOUNTER — Other Ambulatory Visit: Payer: Self-pay | Admitting: Internal Medicine

## 2021-03-16 ENCOUNTER — Ambulatory Visit (INDEPENDENT_AMBULATORY_CARE_PROVIDER_SITE_OTHER): Payer: Medicare HMO

## 2021-03-16 ENCOUNTER — Telehealth: Payer: Medicare HMO

## 2021-03-16 ENCOUNTER — Other Ambulatory Visit: Payer: Self-pay

## 2021-03-16 DIAGNOSIS — I10 Essential (primary) hypertension: Secondary | ICD-10-CM

## 2021-03-16 DIAGNOSIS — M159 Polyosteoarthritis, unspecified: Secondary | ICD-10-CM

## 2021-03-16 DIAGNOSIS — E1121 Type 2 diabetes mellitus with diabetic nephropathy: Secondary | ICD-10-CM

## 2021-03-16 DIAGNOSIS — E785 Hyperlipidemia, unspecified: Secondary | ICD-10-CM

## 2021-03-16 NOTE — Patient Instructions (Signed)
[name],  It was great to talk to you today!  Please call me with any questions or concerns.  Visit Information  Patient verbalizes understanding of instructions provided today and agrees to view in [place].   Telephone follow up appointment with care management team member scheduled for: 6 months The patient has been provided with contact information for the care management team and has been advised to call with any health related questions or concerns.   Tomasa Blase, PharmD Clinical Pharmacist, [place]

## 2021-03-16 NOTE — Progress Notes (Signed)
Chronic Care Management Pharmacy Note  03/16/2021 Name:  Matthew Villanueva MRN:  [id number] DOB:  [birth year]  Summary: - noted that patient did have yeast infection in September which has resolved since starting lotrisone cream - still has supply on hand if needed - currently not having any issues  -started on celecoxib for pain control / stopped tramadol - feels that pain has been better controlled since starting celecoxib  Recommendations/Changes made from today's visit: -Recommending no changes to patient's medication, patient reports that he is not having any issues or concerns at this time, BP, LDL, and A1c are stable and at goal, patient will reach out should any issues arise  Subjective: Matthew Villanueva is an 85 y.o. year old male who is a primary patient of [name], MD.  The CCM team was consulted for assistance with disease management and care coordination needs.    Engaged with patient by telephone for follow up visit in response to provider referral for pharmacy case management and/or care coordination services.   Consent to Services:  The patient was given information about Chronic Care Management services, agreed to services, and gave verbal consent prior to initiation of services.  Please see initial visit note for detailed documentation.   Patient Care Team: [name], MD as PCP - General (Internal Medicine) Chesley Mires, MD (Pulmonary Disease) Monna Fam, MD (Ophthalmology) Charlton Haws, [facility] as Pharmacist (Pharmacist)  Recent office visits: 02/01/21 - Dr. Mitchel Honour - complaints of cerumen impaction / yeast infection - prescribed lotrisone cream - ears cleaned - given flu shot  12/20/20 - Dr. Ronnald Ramp - annual exam - stop tramadol, start celecoxib   Recent consult visits: 02/25/21 - Dr. Johnsie Cancel - Cardiology - no changes to medications 01/14/21 - Dr. Barrington Ellison - Periodontics - follow up for second stage of implant surgery  01/07/21 - Dr. Maryruth Bun -  Periodonitcs - extraction of tooth  #15   Hospital visits: None in previous 6 months  Objective:  Lab Results  Component Value Date   CREATININE 0.98 12/20/2020   BUN 18 12/20/2020   GFR 71.66 12/20/2020   GFRNONAA 68 12/16/2019   GFRAA 79 12/16/2019   NA 137 12/20/2020   K 4.0 12/20/2020   CALCIUM 9.4 12/20/2020   CO2 30 12/20/2020   GLUCOSE 126 (H) 12/20/2020    Lab Results  Component Value Date/Time   HGBA1C 6.7 (H) 12/20/2020 09:43 AM   HGBA1C 7.0 (H) 03/30/2020 10:15 AM   GFR 71.66 12/20/2020 09:43 AM   GFR 77.70 03/30/2020 10:15 AM   MICROALBUR 12.3 (H) 12/20/2020 09:43 AM   MICROALBUR 11.1 12/16/2019 11:40 AM    Last diabetic Eye exam:  Lab Results  Component Value Date/Time   HMDIABEYEEXA No Retinopathy 12/02/2019 12:00 AM    Last diabetic Foot exam:  Lab Results  Component Value Date/Time   HMDIABFOOTEX done 11/08/2017 12:00 AM     Lab Results  Component Value Date   CHOL 144 12/20/2020   HDL 37.20 (L) 12/20/2020   LDLCALC 81 12/16/2019   LDLDIRECT 89.0 12/20/2020   TRIG 203.0 (H) 12/20/2020   CHOLHDL 4 12/20/2020    Hepatic Function Latest Ref Rng & Units 12/20/2020 12/16/2019 07/30/2019  Total Protein 6.0 - 8.3 g/dL 7.3 7.2 7.5  Albumin 3.5 - 5.2 g/dL 3.9 - 4.3  AST 0 - 37 U/L $Remo'18 17 28  'ymscO$ ALT 0 - 53 U/L 23 21 CANCELED  Alk Phosphatase 39 - 117 U/L 19(L) - 31(L)  Total  Bilirubin 0.2 - 1.2 mg/dL 0.6 0.5 0.3  Bilirubin, Direct 0.0 - 0.3 mg/dL 0.1 0.1 -    Lab Results  Component Value Date/Time   TSH 1.34 12/20/2020 09:43 AM   TSH 1.08 12/16/2019 11:40 AM    CBC Latest Ref Rng & Units 12/20/2020 03/30/2020 12/16/2019  WBC 4.0 - 10.5 K/uL 8.7 10.7(H) 9.3  Hemoglobin 13.0 - 17.0 g/dL 16.9 16.3 17.8(H)  Hematocrit 39.0 - 52.0 % 50.2 47.8 52.6(H)  Platelets 150.0 - 400.0 K/uL 203.0 208.0 230    No results found for: VD25OH  Clinical ASCVD: No  The ASCVD Risk score (Arnett DK, et al., 2019) failed to calculate for the following reasons:   The 2019  ASCVD risk score is only valid for ages 72 to 10    Depression screen PHQ 2/9 12/15/2020 08/16/2020 12/16/2019  Decreased Interest 0 0 0  Down, Depressed, Hopeless 0 0 0  PHQ - 2 Score 0 0 0  Altered sleeping - - -  Tired, decreased energy - - -  Change in appetite - - -  Feeling bad or failure about yourself  - - -  Trouble concentrating - - -  Moving slowly or fidgety/restless - - -  Suicidal thoughts - - -  PHQ-9 Score - - -  Difficult doing work/chores - - -      Social History   Tobacco Use  Smoking Status Former   Packs/day: 2.00   Years: 30.00   Pack years: 60.00   Types: Cigarettes   Quit date: 05/15/1984   Years since quitting: 36.8  Smokeless Tobacco Never   BP Readings from Last 3 Encounters:  02/25/21 132/74  02/01/21 124/76  12/20/20 (!) 148/82   Pulse Readings from Last 3 Encounters:  02/25/21 75  02/01/21 93  12/20/20 75   Wt Readings from Last 3 Encounters:  02/25/21 242 lb (109.8 kg)  02/01/21 239 lb (108.4 kg)  12/20/20 243 lb (110.2 kg)   BMI Readings from Last 3 Encounters:  02/25/21 34.72 kg/m  02/01/21 35.29 kg/m  12/20/20 35.88 kg/m    Assessment/Interventions: Review of patient past medical history, allergies, medications, health status, including review of consultants reports, laboratory and other test data, was performed as part of comprehensive evaluation and provision of chronic care management services.   SDOH:  (Social Determinants of Health) assessments and interventions performed: Yes  SDOH Screenings   Alcohol Screen: Low Risk    Last Alcohol Screening Score (AUDIT): 2  Depression (PHQ2-9): Low Risk    PHQ-2 Score: 0  Financial Resource Strain: Low Risk    Difficulty of Paying Living Expenses: Not hard at all  Food Insecurity: No Food Insecurity   Worried About Charity fundraiser in the Last Year: Never true   Ran Out of Food in the Last Year: Never true  Housing: Low Risk    Last Housing Risk Score: 0  Physical  Activity: Inactive   Days of Exercise per Week: 0 days   Minutes of Exercise per Session: 0 min  Social Connections: Moderately Integrated   Frequency of Communication with Friends and Family: More than three times a week   Frequency of Social Gatherings with Friends and Family: More than three times a week   Attends Religious Services: 1 to 4 times per year   Active Member of [employer] or Organizations: Yes   Attends Archivist Meetings: 1 to 4 times per year   Marital Status: Never married  Stress: No  Stress Concern Present   Feeling of Stress : Not at all  Tobacco Use: Medium Risk   Smoking Tobacco Use: Former   Smokeless Tobacco Use: Never   Passive Exposure: Not on file  Transportation Needs: No Transportation Needs   Lack of Transportation (Medical): No   Lack of Transportation (Non-Medical): No    CCM Care Plan  Allergies  Allergen Reactions   Livalo [Pitavastatin] Other (See Comments)    Muscle aches   Crestor [Rosuvastatin]     Muscle aches   Metformin And Related Diarrhea   Pravastatin     Muscle aches    Medications Reviewed Today     Reviewed by Tomasa Blase, [facility] (Pharmacist) on 03/16/21 at 1014  Med List Status: <None>   Medication Order Taking? Sig Documenting Provider Last Dose Status Informant  Ascorbic Acid (VITAMIN C PO) [id number] No Take by mouth. [provider] Taking Active   blood glucose meter kit and supplies KIT [id number] No Use to test blood sugar up to twice daily. DX: E11.21 [name], MD Taking Active   celecoxib (CELEBREX) 100 MG capsule [id number]  TAKE ONE CAPSULE BY MOUTH TWICE A DAY [name], MD  Active   clotrimazole-betamethasone (LOTRISONE) cream [id number] No Apply 1 application topically 2 (two) times daily. Horald Pollen, MD Taking Active   Coenzyme Q10 (COQ10 PO) [id number] No Take by mouth. [provider] Taking Active   colchicine 0.6 MG tablet [id number] No Take 0.6 mg by mouth  daily. [provider] Taking Active   empagliflozin (JARDIANCE) 25 MG TABS tablet [id number] No Take 1 tablet (25 mg total) by mouth daily. [name], MD Taking Active   gabapentin (NEURONTIN) 100 MG capsule [id number] No Take 100 mg by mouth 3 (three) times daily. [provider] Taking Active   glucose blood (COOL BLOOD GLUCOSE TEST STRIPS) test strip [id number] No Use to test blood sugar up to twice daily. DX: E11.21 [name], MD Taking Active   Lancets [facility] ULTRASOFT) lancets [id number] No Use to test blood sugar up to twice daily. DX: E11.21 [name], MD Taking Active   losartan-hydrochlorothiazide [facility]) 100-25 MG tablet [id number] No TAKE ONE TABLET BY MOUTH DAILY [name], MD Taking Active   Multiple Vitamin (MULTIVITAMIN) tablet [id number] No Take 1 tablet by mouth daily. [provider] Taking Active   Omega-3 Fatty Acids (FISH OIL PO) [id number] No Take by mouth. [provider] Taking Active   [facility] VERIO test strip [id number] No TEST BLOOD SUGAR TWO TIMES A DAY [name], MD Taking Active   Semaglutide (RYBELSUS) 7 MG TABS [id number] No Take 1 tablet by mouth daily. 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   Hyperlipidemia with target LDL less than 100 08/12/2013   BPH associated with nocturia 08/12/2013   Morbid obesity ([place]) 05/24/2011   Routine health maintenance 05/24/2011    HEARING  LOSS 04/28/2009   DM (diabetes mellitus), type 2 with renal complications ([place]) 92/03/9416   DJD of shoulder 07/23/2007   Gout 06/25/2007   Essential hypertension 06/25/2007    Immunization History  Administered Date(s) Administered   Fluad Quad(high Dose 65+) 02/17/2019, 02/01/2021   H1N1 04/21/2008   Influenza Whole 04/01/2007, 02/21/2010, 02/13/2011, 03/18/2012   Influenza, High Dose Seasonal PF 03/06/2016, 01/10/2017, 02/01/2018   Influenza,inj,Quad PF,6+ Mos 01/20/2013, 05/04/2015, 01/13/2020   PFIZER(Purple Top)SARS-COV-2 Vaccination 07/06/2019, 08/03/2019, 02/13/2020, 11/11/2020   Pneumococcal Conjugate-13 08/12/2013   Pneumococcal Polysaccharide-23 03/07/2004, 05/17/2017   Td 07/04/2004, 08/16/2020   Tdap 05/04/2015   Zoster Recombinat (Shingrix) 03/08/2019   Zoster, Live 04/29/2010    Conditions to be addressed/monitored:  Hypertension, Hyperlipidemia, Diabetes and Osteoarthritis, Neuropathy  Care Plan : [place]  Updates made by Tomasa Blase, RPH since 03/16/2021 12:00 AM     Problem: Hypertension, Hyperlipidemia, Diabetes and Osteoarthritis, Neuropathy   Priority: High     Long-Range Goal: Disease management   Start Date: 09/15/2020  Expected End Date: 09/15/2021  This Visit's Progress: On track  Recent Progress: On track  Priority: High  Note:   Current Barriers:  Unable to independently afford treatment regimen Unable to independently monitor therapeutic efficacy  Pharmacist Clinical Goal(s):  Patient will verbalize ability to afford treatment regimen achieve adherence to monitoring guidelines and medication adherence to achieve therapeutic efficacy through collaboration with PharmD and provider.   Interventions: 1:1 collaboration with [name], MD regarding development and update of comprehensive plan of care as evidenced by provider attestation and co-signature Inter-disciplinary care team collaboration (see longitudinal  plan of care) Comprehensive medication review performed; medication list updated in electronic medical record  Hypertension    BP goal is:  <130/80 Patient checks BP at home infrequently Patient home BP readings are ranging: n/a   Patient has failed these meds in the past: n/a Patient is currently controlled on the following medications:  Losartan-HCTZ 100-25 mg daily   We discussed: BP goals; benefits of medications; pt denies side effects; kidney function appropriate for continued use   Plan: Continue current medications and control with diet and exercise    Hyperlipidemia    LDL goal < 100 Lab Results  Component Value Date   [place] 81 12/16/2019  Patient has failed these meds in past: rosuvastatin, Vascepa, Livalo, ezetimibe Patient is currently controlled on the following medications:  No medication   We discussed:  diet and exercise extensively; Cholesterol goals; LDL is most recently at goal however triglycerides are high; pt could not afford Vascepa and he thinks it worsened leg cramps; improved BG control may improve triglycerides - A1c has improved since lipids were last checked.   Plan: Continue control with diet and exercise  Diabetes    A1c goal <8% Lab Results  Component Value Date   HGBA1C 6.7 (H) 12/20/2020  Checking BG: Daily Recent FBG Readings: remains well controlled, A1c improved to 6.7% with last check   Patient has failed these meds in past: n/a Patient is currently uncontrolled on the following medications: Jardiance 25 mg daily Rybelsus 7 mg daily   We discussed: diet and exercise extensively; A1c goal; benefits of medications; pt has not had A1c rechecked since starting Rybelsus; pt denies side effects; both medications are expensive in [place]; pt should qualify for PAP based on income   Plan Continue current medications  -Pat[place] did have 1 occurrence of genetial yeast infection since starting jardiance, reports that lotrisone  has cleared  this up, not having any issues at this time   Neuropathy / cramps    Patient has failed these meds in past: n/a Patient is currently controlled on the following medications:  Celecoxib $RemoveBe'100mg'rAhMKmGWG$  - 1 capsule twice daily    We discussed:  Pt reports that pain is better controlled on celebrex compared to tramadol   Plan: Continue to monitor   Patient Goals/Self-Care Activities Patient will:  - take medications as prescribed focus on medication adherence by pill box  Follow Up Plan: Telephone follow up appointment with care management team member scheduled for: 6 months       Medication Assistance:  Jardiance - [place] - in process. Expected approval 10/16/20 Rybelsus - [employer] - in process. Expected approval 10/16/20.  Patient's preferred pharmacy is:  [id number] - [address], [place] [address] [place] [address] Phone: [phone] Fax: [phone]  Uses pill box? Yes Pt endorses 100% compliance  We discussed: Current pharmacy is preferred with insurance plan and patient is satisfied with pharmacy services Patient decided to: Continue current medication management strategy  Care Plan and Follow Up Patient Decision:  Patient agrees to Care Plan and Follow-up.  Plan: Telephone follow up appointment with care management team member scheduled for:  6 months  Tomasa Blase, PharmD Clinical Pharmacist, [place]

## 2021-03-30 ENCOUNTER — Telehealth: Payer: Self-pay

## 2021-03-30 DIAGNOSIS — H2513 Age-related nuclear cataract, bilateral: Secondary | ICD-10-CM | POA: Diagnosis not present

## 2021-03-30 DIAGNOSIS — E119 Type 2 diabetes mellitus without complications: Secondary | ICD-10-CM | POA: Diagnosis not present

## 2021-03-30 DIAGNOSIS — H25013 Cortical age-related cataract, bilateral: Secondary | ICD-10-CM | POA: Diagnosis not present

## 2021-03-30 DIAGNOSIS — H35033 Hypertensive retinopathy, bilateral: Secondary | ICD-10-CM | POA: Diagnosis not present

## 2021-03-30 LAB — HM DIABETES EYE EXAM

## 2021-03-30 NOTE — Progress Notes (Signed)
    Chronic Care Management Pharmacy Assistant   Name: Matthew Villanueva  MRN: [id number] DOB: 01-13-1936  Patient is currently enrolled in [place] patient assistance program for the medication Rybelsus until date: 05/14/21 Patient would like to re-enroll in patient assistance for the next calendar year.  Renewal forms were completed on behalf of the patient. Patient has requested to sign forms in the office. Forms will be printed and placed at front desk.Left message for patient to return call.  [facility] [place] Pharmacist Assistant [phone]

## 2021-04-13 DIAGNOSIS — E1121 Type 2 diabetes mellitus with diabetic nephropathy: Secondary | ICD-10-CM

## 2021-04-13 DIAGNOSIS — E785 Hyperlipidemia, unspecified: Secondary | ICD-10-CM | POA: Diagnosis not present

## 2021-04-13 DIAGNOSIS — I10 Essential (primary) hypertension: Secondary | ICD-10-CM

## 2021-05-13 ENCOUNTER — Other Ambulatory Visit: Payer: Self-pay | Admitting: Internal Medicine

## 2021-05-13 DIAGNOSIS — E1121 Type 2 diabetes mellitus with diabetic nephropathy: Secondary | ICD-10-CM

## 2021-06-14 IMAGING — DX DG ABDOMEN ACUTE W/ 1V CHEST
4 series · 4 of 4 positions shown · non-contrast
Comparison: 03/06/2016

CLINICAL DATA: Right flank pain for 5 days. History of hypertension
and diabetes.

EXAM:
DG ABDOMEN ACUTE W/ 1V CHEST

[chest pa]
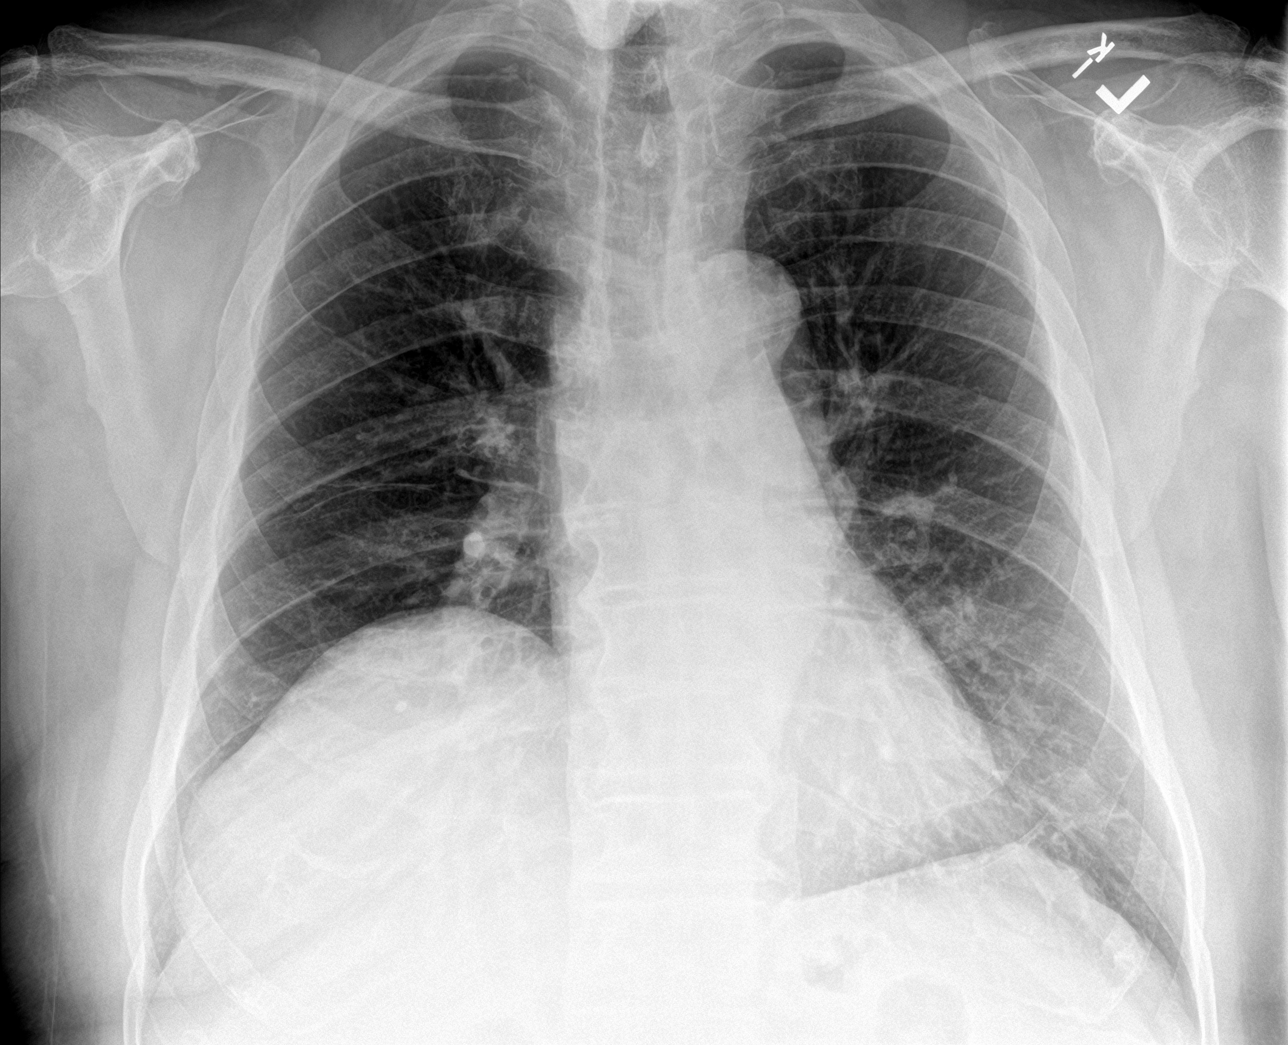

[abdomen erect]
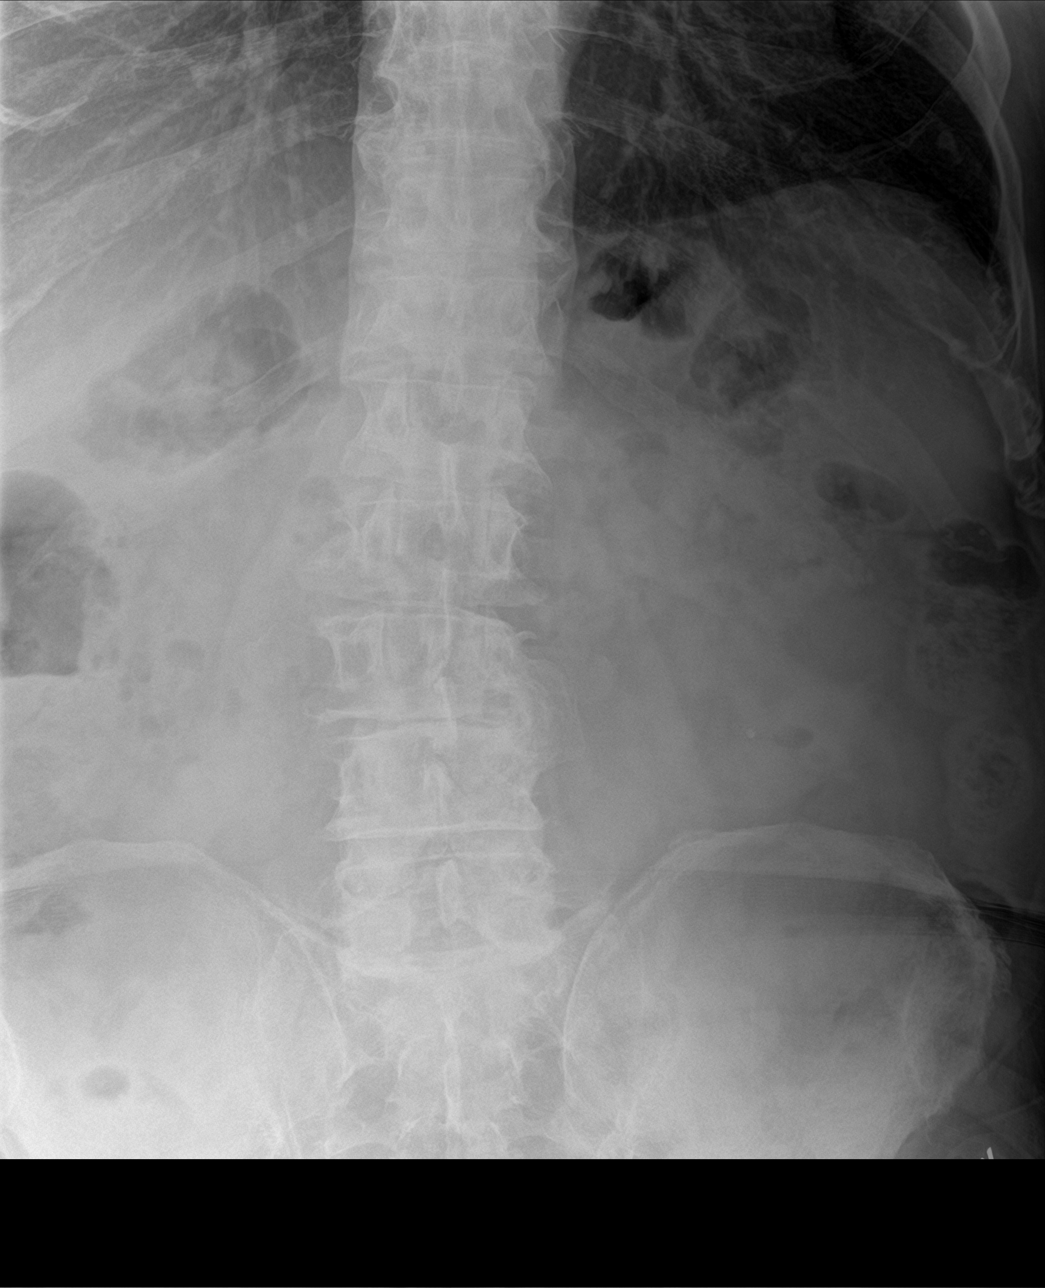

[abdomen supine (1 of 2)]
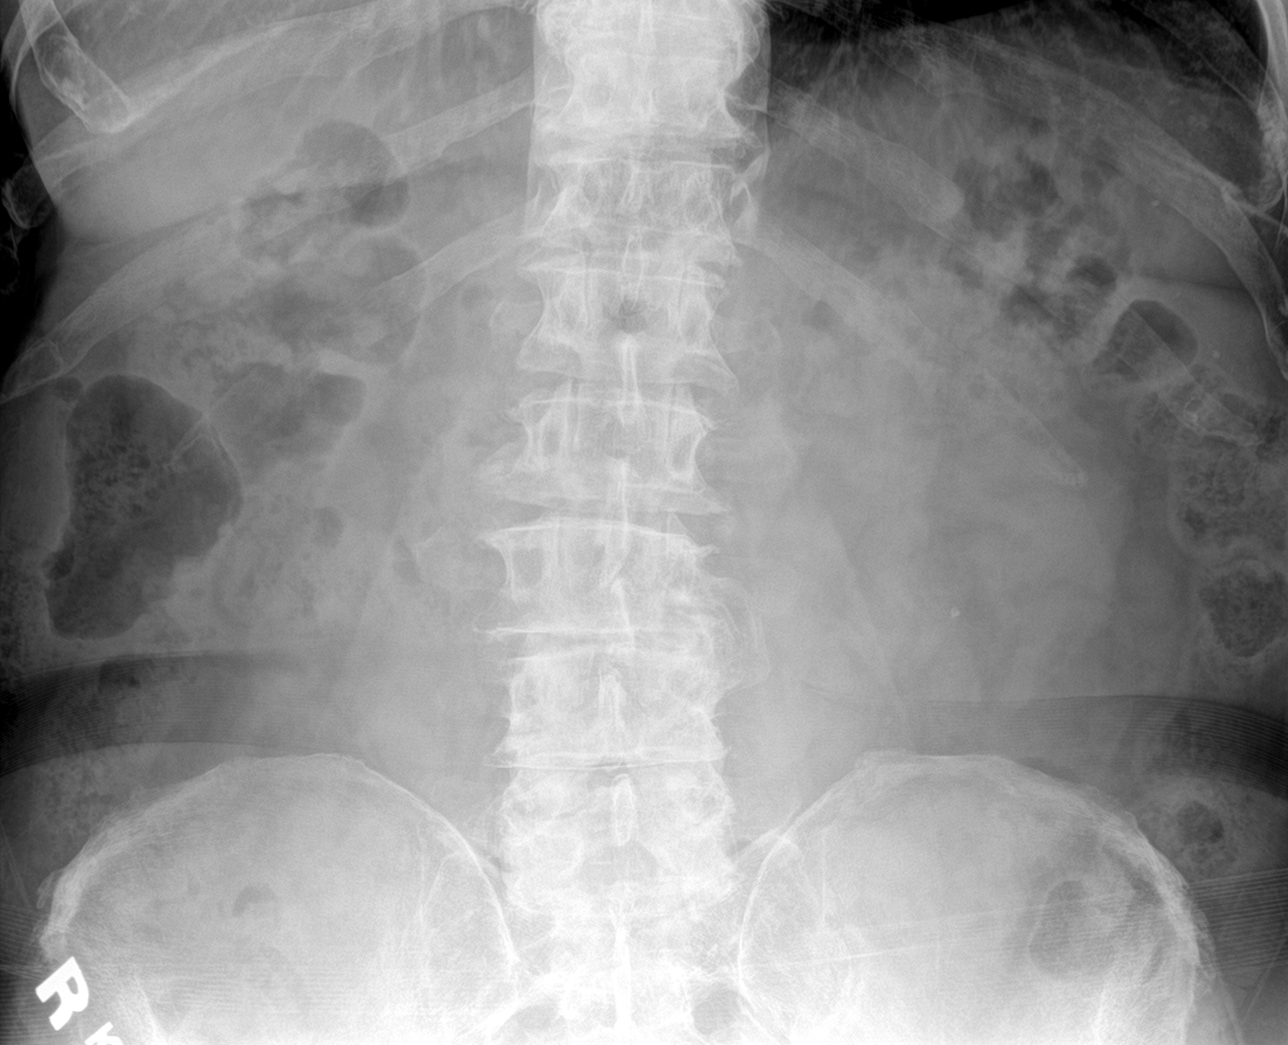

[abdomen supine (2 of 2)]
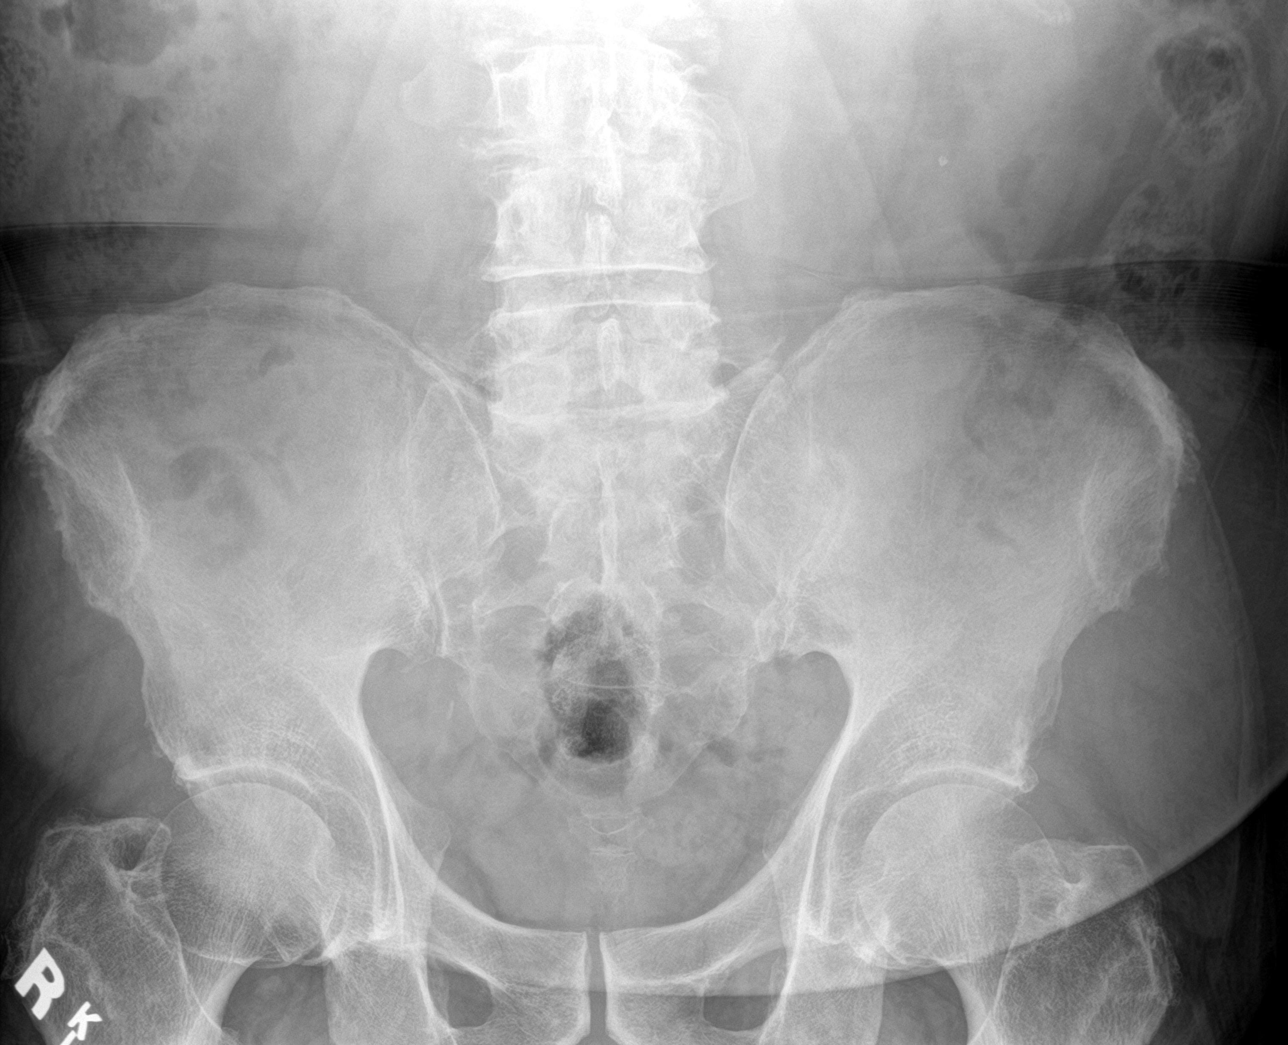

[4 of 4 positions shown; findings below may reference images not displayed]

FINDINGS: Small round calcification projects in the left mid abdomen, possibly
within the lower pole of the left kidney. No other evidence of a
renal stone. No evidence of a ureteral stone. Soft tissues otherwise
unremarkable.

Normal bowel gas pattern.  No free air.

Cardiac silhouette is normal size. No mediastinal or hilar masses.
No convincing adenopathy.

Prominent lung markings in the bases. Lungs otherwise clear.
Elevated right hemidiaphragm.
IMPRESSION: 1. No acute findings within the abdomen or pelvis.
2. Probable small nonobstructing stone in the lower pole the right
kidney. No evidence of a ureteral stone.
3. No active cardiopulmonary disease.

## 2021-07-11 ENCOUNTER — Other Ambulatory Visit: Payer: Self-pay | Admitting: Internal Medicine

## 2021-07-11 DIAGNOSIS — I10 Essential (primary) hypertension: Secondary | ICD-10-CM

## 2021-07-11 DIAGNOSIS — E1121 Type 2 diabetes mellitus with diabetic nephropathy: Secondary | ICD-10-CM

## 2021-07-12 ENCOUNTER — Telehealth: Payer: Self-pay | Admitting: Internal Medicine

## 2021-07-12 ENCOUNTER — Other Ambulatory Visit: Payer: Self-pay | Admitting: Internal Medicine

## 2021-07-12 DIAGNOSIS — E1121 Type 2 diabetes mellitus with diabetic nephropathy: Secondary | ICD-10-CM

## 2021-07-12 DIAGNOSIS — I10 Essential (primary) hypertension: Secondary | ICD-10-CM

## 2021-07-12 NOTE — Telephone Encounter (Signed)
PT did not specify! Just that you and Dr.Jones needed it

## 2021-07-12 NOTE — Telephone Encounter (Signed)
PT visits today and leaves proof of income. Copy of W2 has been left in Dr.Jones' mailbox!

## 2021-07-16 ENCOUNTER — Other Ambulatory Visit: Payer: Self-pay | Admitting: Internal Medicine

## 2021-07-16 DIAGNOSIS — I10 Essential (primary) hypertension: Secondary | ICD-10-CM

## 2021-07-16 DIAGNOSIS — E1121 Type 2 diabetes mellitus with diabetic nephropathy: Secondary | ICD-10-CM

## 2021-07-26 ENCOUNTER — Other Ambulatory Visit: Payer: Self-pay

## 2021-07-26 ENCOUNTER — Encounter: Payer: Self-pay | Admitting: Internal Medicine

## 2021-07-26 ENCOUNTER — Ambulatory Visit (INDEPENDENT_AMBULATORY_CARE_PROVIDER_SITE_OTHER): Payer: Medicare HMO | Admitting: Internal Medicine

## 2021-07-26 VITALS — BP 126/80 | HR 67 | Temp 98.0°F | Ht 70.0 in | Wt 248.0 lb

## 2021-07-26 DIAGNOSIS — H903 Sensorineural hearing loss, bilateral: Secondary | ICD-10-CM | POA: Diagnosis not present

## 2021-07-26 DIAGNOSIS — L989 Disorder of the skin and subcutaneous tissue, unspecified: Secondary | ICD-10-CM | POA: Diagnosis not present

## 2021-07-26 DIAGNOSIS — E1121 Type 2 diabetes mellitus with diabetic nephropathy: Secondary | ICD-10-CM | POA: Diagnosis not present

## 2021-07-26 DIAGNOSIS — E785 Hyperlipidemia, unspecified: Secondary | ICD-10-CM

## 2021-07-26 DIAGNOSIS — I10 Essential (primary) hypertension: Secondary | ICD-10-CM | POA: Diagnosis not present

## 2021-07-26 LAB — BASIC METABOLIC PANEL
BUN: 21 mg/dL (ref 6–23)
CO2: 27 mEq/L (ref 19–32)
Calcium: 9.5 mg/dL (ref 8.4–10.5)
Chloride: 101 mEq/L (ref 96–112)
Creatinine, Ser: 1.12 mg/dL (ref 0.40–1.50)
GFR: 60.79 mL/min (ref >60.00)
Glucose, Bld: 132 mg/dL — ABNORMAL HIGH (ref 70–99)
Potassium: 3.9 mEq/L (ref 3.5–5.1)
Sodium: 136 mEq/L (ref 135–145)

## 2021-07-26 LAB — LIPID PANEL
Cholesterol: 155 mg/dL (ref 0–200)
HDL: 38.2 mg/dL — ABNORMAL LOW (ref >39.00)
NonHDL: 116.31
Total CHOL/HDL Ratio: 4
Triglycerides: 380 mg/dL — ABNORMAL HIGH (ref 0.0–149.0)
VLDL: 76 mg/dL — ABNORMAL HIGH (ref 0.0–40.0)

## 2021-07-26 LAB — HEMOGLOBIN A1C: Hgb A1c MFr Bld: 7.9 % — ABNORMAL HIGH (ref 4.6–6.5)

## 2021-07-26 LAB — HEPATIC FUNCTION PANEL
ALT: 22 U/L (ref 0–53)
AST: 20 U/L (ref 0–37)
Albumin: 4 g/dL (ref 3.5–5.2)
Alkaline Phosphatase: 22 U/L — ABNORMAL LOW (ref 39–117)
Bilirubin, Direct: 0.1 mg/dL (ref 0.0–0.3)
Total Bilirubin: 0.4 mg/dL (ref 0.2–1.2)
Total Protein: 7 g/dL (ref 6.0–8.3)

## 2021-07-26 LAB — LDL CHOLESTEROL, DIRECT: Direct LDL: 91 mg/dL

## 2021-07-26 MED ORDER — RYBELSUS 7 MG PO TABS: 1.0000 | ORAL_TABLET | Freq: Every day | ORAL | 1 refills | Status: DC

## 2021-07-26 MED ORDER — EMPAGLIFLOZIN 25 MG PO TABS: 25.0000 mg | ORAL_TABLET | Freq: Every day | ORAL | 1 refills | Status: DC

## 2021-07-26 MED ORDER — LOSARTAN POTASSIUM-HCTZ 100-25 MG PO TABS: 1.0000 | ORAL_TABLET | Freq: Every day | ORAL | 1 refills | Status: DC

## 2021-07-26 NOTE — Progress Notes (Signed)
Patient ID: Matthew Villanueva, male   DOB: 1935-10-19, 86 y.o.   MRN: [id number] ? ? ? ?    Chief Complaint: follow up HTN, hyperglycemia, skin lesion mid low back, hld with hx of statin intolerance ? ?     HPI:  Matthew Villanueva is a 86 y.o. male here as PCP not able today; Pt denies chest pain, increased sob or doe, wheezing, orthopnea, PND, increased LE swelling, palpitations, dizziness or syncope.   Pt denies polydipsia, polyuria, or new focal neuro s/s.    Pt denies fever, wt loss, night sweats, loss of appetite, or other constitutional symptoms  does have 2 mo worsening nontender raised skin lesion to mid right lower back that he is aware of, asking for derm referral.  Has unfortunately been out of medication for bp and dm for 2 wks as had some difficultly with coordination of communication with pharmacy and our office per pt.  Also has had worsening bilateral hearing loss for over 6 mo, now at least moderate, and starting to think he may need hearing aids.  ?      ?Wt Readings from Last 3 Encounters:  ?07/26/21 248 lb (112.5 kg)  ?02/25/21 242 lb (109.8 kg)  ?02/01/21 239 lb (108.4 kg)  ? ?BP Readings from Last 3 Encounters:  ?07/26/21 126/80  ?02/25/21 132/74  ?02/01/21 124/76  ? ?      ?Past Medical History:  ?Diagnosis Date  ? Arthritis   ? Diabetes mellitus   ? NO INSULIN  ? Dyspnea   ? Gout   ? History of colonic polyps   ? HTN (hypertension)   ? Localized osteoarthrosis not specified whether primary or secondary, hand   ? OSA (obstructive sleep apnea)   ? Other specified cardiac dysrhythmias(427.89) 2004  ? ABLATION DUE TO TACHYCARDIA  ? Right bundle branch block   ? ?Past Surgical History:  ?Procedure Laterality Date  ? CARDIAC ELECTROPHYSIOLOGY STUDY AND ABLATION  2004  ? ablation for SVT with a chronic right bundle branch block   ? [place]  ? PILONIDAL CYST DRAINAGE    ? ? reports that he quit smoking about 37 years ago. His smoking use included cigarettes. He has a 60.00  pack-year smoking history. He has never used smokeless tobacco. He reports current alcohol use. He reports that he does not use drugs. ?family history includes Cancer in his father; Diabetes in his maternal uncle; Heart attack in his maternal grandmother; Heart disease in his maternal aunt, maternal grandfather, maternal grandmother, and mother; Lung cancer in his sister. ?Allergies  ?Allergen Reactions  ? Livalo [Pitavastatin] Other (See Comments)  ?  Muscle aches  ? Crestor [Rosuvastatin]   ?  Muscle aches  ? Metformin And Related Diarrhea  ? Pravastatin   ?  Muscle aches  ? ?Current Outpatient Medications on File Prior to Visit  ?Medication Sig Dispense Refill  ? Ascorbic Acid (VITAMIN C PO) Take by mouth.    ? blood glucose meter kit and supplies KIT Use to test blood sugar up to twice daily. DX: E11.21 1 each 0  ? celecoxib (CELEBREX) 100 MG capsule TAKE ONE CAPSULE BY MOUTH TWICE A DAY 180 capsule 0  ? clotrimazole-betamethasone (LOTRISONE) cream Apply 1 application topically 2 (two) times daily. 30 g 3  ? Coenzyme Q10 (COQ10 PO) Take by mouth.    ? colchicine 0.6 MG tablet Take 0.6 mg by mouth daily.    ? gabapentin (NEURONTIN) 100 MG capsule  Take 100 mg by mouth 3 (three) times daily.    ? glucose blood (COOL BLOOD GLUCOSE TEST STRIPS) test strip Use to test blood sugar up to twice daily. DX: E11.21 100 each 12  ? Lancets (ONETOUCH ULTRASOFT) lancets Use to test blood sugar up to twice daily. DX: E11.21 100 each 12  ? Multiple Vitamin (MULTIVITAMIN) tablet Take 1 tablet by mouth daily.    ? Omega-3 Fatty Acids (FISH OIL PO) Take by mouth.    ? ONETOUCH VERIO test strip USE TO TEST BLOOD SUGAR TWO TIMES A DAY 150 strip 5  ? ?No current facility-administered medications on file prior to visit.  ? ?     ROS:  All others reviewed and negative. ? ?Objective  ? ?     PE:  BP 126/80 (BP Location: Right Arm, Patient Position: Sitting, Cuff Size: Large)   Pulse 67   Temp 98 ?F (36.7 ?C) (Oral)   Ht $R'5\' 10"'Hv$  (1.778  m)   Wt 248 lb (112.5 kg)   SpO2 94%   BMI 35.58 kg/m?  ? ?              Constitutional: Pt appears in NAD ?              HENT: Head: NCAT.  ?              Right Ear: External ear normal.   ?              Left Ear: External ear normal.  ?              Eyes: . Pupils are equal, round, and reactive to light. Conjunctivae and EOM are normal ?              Nose: without d/c or deformity ?              Neck: Neck supple. Gross normal ROM ?              Cardiovascular: Normal rate and regular rhythm.   ?              Pulmonary/Chest: Effort normal and breath sounds without rales or wheezing.  ?              Abd:  Soft, NT, ND, + BS, no organomegaly ?              Neurological: Pt is alert. At baseline orientation, motor grossly intact ?              Skin:  LE edema - none; right low mid back approx L3 just paravertebral is approx 12 mm slight raised tan/dark lesion, regular edges nonulcerated ?              Psychiatric: Pt behavior is normal without agitation  ? ?Micro: none ? ?Cardiac tracings I have personally interpreted today:  none ? ?Pertinent Radiological findings (summarize): none  ? ?Lab Results  ?Component Value Date  ? WBC 8.7 12/20/2020  ? HGB 16.9 12/20/2020  ? HCT 50.2 12/20/2020  ? PLT 203.0 12/20/2020  ? GLUCOSE 132 (H) 07/26/2021  ? CHOL 155 07/26/2021  ? TRIG 380.0 (H) 07/26/2021  ? HDL 38.20 (L) 07/26/2021  ? LDLDIRECT 91.0 07/26/2021  ? [place] 81 12/16/2019  ? ALT 22 07/26/2021  ? AST 20 07/26/2021  ? NA 136 07/26/2021  ? K 3.9 07/26/2021  ? CL 101 07/26/2021  ? CREATININE 1.12 07/26/2021  ?  BUN 21 07/26/2021  ? CO2 27 07/26/2021  ? TSH 1.34 12/20/2020  ? PSA 2.86 12/03/2017  ? HGBA1C 7.9 (H) 07/26/2021  ? MICROALBUR 12.3 (H) 12/20/2020  ? ?Assessment/Plan:  ?Matthew Villanueva is a 86 y.o. White or Caucasian [1] male with  has a past medical history of Arthritis, Diabetes mellitus, Dyspnea, Gout, History of colonic polyps, HTN (hypertension), Localized osteoarthrosis not specified whether  primary or secondary, hand, OSA (obstructive sleep apnea), Other specified cardiac dysrhythmias(427.89) (2004), and Right bundle branch block. ? ?Essential hypertension ?BP Readings from Last 3 Encounters:  ?07/26/21 126/80  ?02/25/21 132/74  ?02/01/21 124/76  ? ?Stable, pt to continue medical treatment hyzaar ? ? ?DM (diabetes mellitus), type 2 with renal complications ?Lab Results  ?Component Value Date  ? HGBA1C 7.9 (H) 07/26/2021  ? ?Uncontrolled, out of med for 2 wks but mild elevated overall prior to that,  To continue jardiance, and increase rybelsus to 14 qd ? ?Hearing loss ?Bialteral, no wax impactions or infection today, I enouraged pt to seek OTC hearing aid such as [facility] or [facility] ? ?Hyperlipidemia with target LDL less than 100 ?Lab Results  ?Component Value Date  ? [place] 81 12/16/2019  ? ?Mild uncontrolled, goal ldl < 70, , pt to continue current diet as declines statin or zetia today ? ? ?Back skin lesion ?Has hx of skin CA squamous cell, cant r/o malignancy with this lesion, for derm referral ? ?Followup: Return in about 6 months (around 01/26/2022). ? ?Cathlean Cower, MD 07/30/2021 7:40 AM ?[facility] ?[place] ?Internal Medicine ?

## 2021-07-26 NOTE — Patient Instructions (Signed)
Please continue all other medications as before, and refills have been done if requested for the BP and diabetes meds ? ?Please have the pharmacy call with any other refills you may need. ? ?Please continue your efforts at being more active, low cholesterol diet, and weight control. ? ?Please keep your appointments with your specialists as you may have planned ? ?You will be contacted regarding the referral for: dermatology ? ?Please go to the LAB at the blood drawing area for the tests to be done ? ?You will be contacted by phone if any changes need to be made immediately.  Otherwise, you will receive a letter about your results with an explanation, but please check with MyChart first. ? ?Please remember to sign up for MyChart if you have not done so, as this will be important to you in the future with finding out test results, communicating by private email, and scheduling acute appointments online when needed. ? ?Please make an Appointment to return in 6 months, or sooner if needed, to Dr Ronnald Ramp ?

## 2021-07-27 ENCOUNTER — Other Ambulatory Visit: Payer: Self-pay | Admitting: Internal Medicine

## 2021-07-27 MED ORDER — RYBELSUS 14 MG PO TABS
ORAL_TABLET | ORAL | 3 refills | Status: DC
Start: 2021-07-27 — End: 2023-09-24

## 2021-07-30 ENCOUNTER — Encounter: Payer: Self-pay | Admitting: Internal Medicine

## 2021-07-30 DIAGNOSIS — L989 Disorder of the skin and subcutaneous tissue, unspecified: Secondary | ICD-10-CM | POA: Insufficient documentation

## 2021-07-30 NOTE — Assessment & Plan Note (Signed)
Lab Results  ?Component Value Date  ? HGBA1C 7.9 (H) 07/26/2021  ? ?Uncontrolled, out of med for 2 wks but mild elevated overall prior to that,  To continue jardiance, and increase rybelsus to 14 qd ?

## 2021-07-30 NOTE — Assessment & Plan Note (Signed)
Lab Results  ?Component Value Date  ? Fowler 81 12/16/2019  ? ?Mild uncontrolled, goal ldl < 70, , pt to continue current diet as declines statin or zetia today ? ?

## 2021-07-30 NOTE — Assessment & Plan Note (Signed)
Bialteral, no wax impactions or infection today, I enouraged pt to seek OTC hearing aid such as [facility] or [facility] ?

## 2021-07-30 NOTE — Assessment & Plan Note (Signed)
BP Readings from Last 3 Encounters:  ?07/26/21 126/80  ?02/25/21 132/74  ?02/01/21 124/76  ? ?Stable, pt to continue medical treatment hyzaar ? ?

## 2021-07-30 NOTE — Assessment & Plan Note (Signed)
Has hx of skin CA squamous cell, cant r/o malignancy with this lesion, for derm referral ?

## 2021-08-09 ENCOUNTER — Telehealth: Payer: Self-pay | Admitting: Internal Medicine

## 2021-08-09 NOTE — Telephone Encounter (Signed)
PT visits today and provides a Patient assistant form as well as their proof of insurance. Forms have been left in the mail box! ?

## 2021-08-09 NOTE — Telephone Encounter (Signed)
PAP application faxed to [employer] ? ?Follow up in 1 week for status of PAP ?

## 2021-08-11 ENCOUNTER — Telehealth: Payer: Self-pay

## 2021-08-11 NOTE — Progress Notes (Signed)
? ? ?  Chronic Care Management ?Pharmacy Assistant  ? ?Name: Matthew Villanueva  MRN: [id number] DOB: 1936-04-22 ? ? ?Contacted  [employer]   to follow up on patient assistance application for Rybelsus. Per representative at  [employer]  states patient has been approved starting 08/11/21 through 05/14/22. Shipment was processed today but may take up to 6 weeks for delivery.   ? ?Ethelene Hal ?Clinical Pharmacist Assistant ?[phone]  ?

## 2021-08-25 ENCOUNTER — Telehealth: Payer: Self-pay | Admitting: Internal Medicine

## 2021-08-25 NOTE — Telephone Encounter (Signed)
Pt checking status of rybelsus delivery. Pt hung up before I could inform him per Linna Hoff  ? ?" he was approved at the end of last month but they have been quite delayed on their shipping...he can call [employer] if he wants to ask for an update  ? ? ?[phone] " ?

## 2021-09-01 ENCOUNTER — Telehealth: Payer: Self-pay

## 2021-09-01 NOTE — Progress Notes (Signed)
? ? ?  Chronic Care Management ?Pharmacy Assistant  ? ?Name: Matthew Villanueva  MRN: [id number] DOB: 10-29-1935 ? ? ?Reason for Encounter: Disease State-General ?  ?Recent office visits:  ?07/26/21 Biagio Borg, MD-Internal Medicine (Back skin lesion) Blood work orders  ? ?Recent consult visits:  ?None since last coordination call ? ?Hospital visits:  ?None in previous 6 months ? ?Medications: ?Outpatient Encounter Medications as of 09/01/2021  ?Medication Sig  ? Ascorbic Acid (VITAMIN C PO) Take by mouth.  ? blood glucose meter kit and supplies KIT Use to test blood sugar up to twice daily. DX: E11.21  ? celecoxib (CELEBREX) 100 MG capsule TAKE ONE CAPSULE BY MOUTH TWICE A DAY  ? clotrimazole-betamethasone (LOTRISONE) cream Apply 1 application topically 2 (two) times daily.  ? Coenzyme Q10 (COQ10 PO) Take by mouth.  ? colchicine 0.6 MG tablet Take 0.6 mg by mouth daily.  ? empagliflozin (JARDIANCE) 25 MG TABS tablet Take 1 tablet (25 mg total) by mouth daily.  ? gabapentin (NEURONTIN) 100 MG capsule Take 100 mg by mouth 3 (three) times daily.  ? glucose blood (COOL BLOOD GLUCOSE TEST STRIPS) test strip Use to test blood sugar up to twice daily. DX: E11.21  ? Lancets (ONETOUCH ULTRASOFT) lancets Use to test blood sugar up to twice daily. DX: E11.21  ? losartan-hydrochlorothiazide (HYZAAR) 100-25 MG tablet Take 1 tablet by mouth daily.  ? Multiple Vitamin (MULTIVITAMIN) tablet Take 1 tablet by mouth daily.  ? Omega-3 Fatty Acids (FISH OIL PO) Take by mouth.  ? ONETOUCH VERIO test strip USE TO TEST BLOOD SUGAR TWO TIMES A DAY  ? Semaglutide (RYBELSUS) 14 MG TABS 1 tab by mouth once daily  ? ?No facility-administered encounter medications on file as of 09/01/2021.  ? ?Reviewed chart for medication changes and drug therapy problems ahead of medication adherence call. ? ?Attempted to contact patient x 3 for medication review and health check, unable to reach patient, left voicemails to return call. ? ?  ?Care  Gaps: ?Colonoscopy-03/31/15 ?Diabetic Foot Exam-03/30/20 ?Ophthalmology-03/30/21 ?Dexa Scan - NA ?Annual Well Visit - NA ?Micro albumin-12/20/20 ?Hemoglobin A1c- 07/26/21 ? ?Star Rating Drugs: ?Rybelsus 14 mg-last fill 08/24/21 30 ds ?Losartan-HCTZ 100-52m-last fill 07/26/21 90 ds ?Jardiance 25 mg-last fill 08/28/21 30 ds ? ?TEthelene Hal?Clinical Pharmacist Assistant ?[phone] ?

## 2021-09-16 ENCOUNTER — Other Ambulatory Visit: Payer: Self-pay | Admitting: Internal Medicine

## 2021-09-16 ENCOUNTER — Ambulatory Visit (INDEPENDENT_AMBULATORY_CARE_PROVIDER_SITE_OTHER): Payer: Medicare HMO

## 2021-09-16 DIAGNOSIS — E1121 Type 2 diabetes mellitus with diabetic nephropathy: Secondary | ICD-10-CM

## 2021-09-16 DIAGNOSIS — E785 Hyperlipidemia, unspecified: Secondary | ICD-10-CM

## 2021-09-16 DIAGNOSIS — I10 Essential (primary) hypertension: Secondary | ICD-10-CM

## 2021-09-16 DIAGNOSIS — M1A39X0 Chronic gout due to renal impairment, multiple sites, without tophus (tophi): Secondary | ICD-10-CM

## 2021-09-16 MED ORDER — COLCHICINE 0.6 MG PO TABS: 0.6000 mg | ORAL_TABLET | Freq: Every day | ORAL | 0 refills | Status: DC

## 2021-09-16 NOTE — Progress Notes (Signed)
?Chronic Care Management ?Pharmacy Note ? ?09/16/2021 ?Name:  Matthew Villanueva MRN:  [id number] DOB:  Mar 11, 1936 ? ?Summary: ?- Patient endorses compliance to current medications, denies any issues since increasing rybelsus to $RemoveBef'14mg'ejUnDJBtmf$  daily  ?-Notes that his diet has not been the best as he has been traveling recently, BG has been ranging from 120-170 ?-Not currently checking BP at home, but has been controlled at most recent office visits ? ?Recommendations/Changes made from today's visit: ?-Recommending no changes to medications at this time, advised for patient to reduce carb/sugar intake and to watch diet to bring A1c back to goal ?-Patient to reach out should Bg remain elevated from goal ?-f/u in 6 months  ? ?Subjective: ?Matthew Villanueva is an 86 y.o. year old male who is a primary patient of [name], MD.  The CCM team was consulted for assistance with disease management and care coordination needs.   ? ?Engaged with patient by telephone for follow up visit in response to provider referral for pharmacy case management and/or care coordination services.  ? ?Consent to Services:  ?The patient was given information about Chronic Care Management services, agreed to services, and gave verbal consent prior to initiation of services.  Please see initial visit note for detailed documentation.  ? ?Patient Care Team: ?[name], MD as PCP - General (Internal Medicine) ?Chesley Mires, MD (Pulmonary Disease) ?Monna Fam, MD (Ophthalmology) ?Foltanski, Cleaster Corin, [facility] as Pharmacist (Pharmacist) ? ?Recent office visits: ?07/26/2021  - Dr. Jenny Reichmann  - no changes to medications  ? ?Recent consult visits: ?07/18/2021 - Dr. Renie Ora - Dentistry - implant crown placement #29, 20 ? ?Hospital visits: ?None in previous 6 months ? ?Objective: ? ?Lab Results  ?Component Value Date  ? CREATININE 1.12 07/26/2021  ? BUN 21 07/26/2021  ? GFR 60.79 07/26/2021  ? GFRNONAA 68 12/16/2019  ? GFRAA 79 12/16/2019  ? NA 136  07/26/2021  ? K 3.9 07/26/2021  ? CALCIUM 9.5 07/26/2021  ? CO2 27 07/26/2021  ? GLUCOSE 132 (H) 07/26/2021  ? ? ?Lab Results  ?Component Value Date/Time  ? HGBA1C 7.9 (H) 07/26/2021 10:14 AM  ? HGBA1C 6.7 (H) 12/20/2020 09:43 AM  ? GFR 60.79 07/26/2021 10:14 AM  ? GFR 71.66 12/20/2020 09:43 AM  ? MICROALBUR 12.3 (H) 12/20/2020 09:43 AM  ? MICROALBUR 11.1 12/16/2019 11:40 AM  ?  ?Last diabetic Eye exam:  ?Lab Results  ?Component Value Date/Time  ? HMDIABEYEEXA No Retinopathy 03/30/2021 12:00 AM  ?  ?Last diabetic Foot exam:  ?Lab Results  ?Component Value Date/Time  ? HMDIABFOOTEX done 11/08/2017 12:00 AM  ?  ? ?Lab Results  ?Component Value Date  ? CHOL 155 07/26/2021  ? HDL 38.20 (L) 07/26/2021  ? [place] 81 12/16/2019  ? LDLDIRECT 91.0 07/26/2021  ? TRIG 380.0 (H) 07/26/2021  ? CHOLHDL 4 07/26/2021  ? ? ? ?  Latest Ref Rng & Units 07/26/2021  ? 10:14 AM 12/20/2020  ?  9:43 AM 12/16/2019  ? 11:40 AM  ?Hepatic Function  ?Total Protein 6.0 - 8.3 g/dL 7.0   7.3   7.2    ?Albumin 3.5 - 5.2 g/dL 4.0   3.9     ?AST 0 - 37 U/L $Remo'20   18   17    'jpNDb$ ?ALT 0 - 53 U/L $Remo'22   23   21    'dpmiN$ ?Alk Phosphatase 39 - 117 U/L 22   19     ?Total Bilirubin 0.2 - 1.2 mg/dL 0.4  0.6   0.5    ?Bilirubin, Direct 0.0 - 0.3 mg/dL 0.1   0.1   0.1    ? ? ?Lab Results  ?Component Value Date/Time  ? TSH 1.34 12/20/2020 09:43 AM  ? TSH 1.08 12/16/2019 11:40 AM  ? ? ? ?  Latest Ref Rng & Units 12/20/2020  ?  9:43 AM 03/30/2020  ? 10:15 AM 12/16/2019  ? 11:40 AM  ?CBC  ?WBC 4.0 - 10.5 K/uL 8.7   10.7   9.3    ?Hemoglobin 13.0 - 17.0 g/dL 16.9   16.3   17.8    ?Hematocrit 39.0 - 52.0 % 50.2   47.8   52.6    ?Platelets 150.0 - 400.0 K/uL 203.0   208.0   230    ? ? ?No results found for: VD25OH ? ?Clinical ASCVD: No  ?The ASCVD Risk score (Arnett DK, et al., 2019) failed to calculate for the following reasons: ?  The 2019 ASCVD risk score is only valid for ages 31 to 72   ? ? ?  12/15/2020  ?  4:28 PM 08/16/2020  ?  1:15 PM 12/16/2019  ? 10:40 AM  ?Depression screen PHQ 2/9   ?Decreased Interest 0 0 0  ?Down, Depressed, Hopeless 0 0 0  ?PHQ - 2 Score 0 0 0  ?  ? ? ?Social History  ? ?Tobacco Use  ?Smoking Status Former  ? Packs/day: 2.00  ? Years: 30.00  ? Pack years: 60.00  ? Types: Cigarettes  ? Quit date: 05/15/1984  ? Years since quitting: 37.3  ?Smokeless Tobacco Never  ? ?BP Readings from Last 3 Encounters:  ?07/26/21 126/80  ?02/25/21 132/74  ?02/01/21 124/76  ? ?Pulse Readings from Last 3 Encounters:  ?07/26/21 67  ?02/25/21 75  ?02/01/21 93  ? ?Wt Readings from Last 3 Encounters:  ?07/26/21 248 lb (112.5 kg)  ?02/25/21 242 lb (109.8 kg)  ?02/01/21 239 lb (108.4 kg)  ? ?BMI Readings from Last 3 Encounters:  ?07/26/21 35.58 kg/m?  ?02/25/21 34.72 kg/m?  ?02/01/21 35.29 kg/m?  ? ? ?Assessment/Interventions: Review of patient past medical history, allergies, medications, health status, including review of consultants reports, laboratory and other test data, was performed as part of comprehensive evaluation and provision of chronic care management services.  ? ?SDOH:  (Social Determinants of Health) assessments and interventions performed: Yes ? ?SDOH Screenings  ? ?Alcohol Screen: Low Risk   ? Last Alcohol Screening Score (AUDIT): 2  ?Depression (PHQ2-9): Low Risk   ? PHQ-2 Score: 0  ?Financial Resource Strain: Low Risk   ? Difficulty of Paying Living Expenses: Not hard at all  ?Food Insecurity: No Food Insecurity  ? Worried About Charity fundraiser in the Last Year: Never true  ? Ran Out of Food in the Last Year: Never true  ?Housing: Low Risk   ? Last Housing Risk Score: 0  ?Physical Activity: Inactive  ? Days of Exercise per Week: 0 days  ? Minutes of Exercise per Session: 0 min  ?Social Connections: Moderately Integrated  ? Frequency of Communication with Friends and Family: More than three times a week  ? Frequency of Social Gatherings with Friends and Family: More than three times a week  ? Attends Religious Services: 1 to 4 times per year  ? Active Member of Clubs or  Organizations: Yes  ? Attends Archivist Meetings: 1 to 4 times per year  ? Marital Status: Never married  ?Stress: No Stress Concern  Present  ? Feeling of Stress : Not at all  ?Tobacco Use: Medium Risk  ? Smoking Tobacco Use: Former  ? Smokeless Tobacco Use: Never  ? Passive Exposure: Not on file  ?Transportation Needs: No Transportation Needs  ? Lack of Transportation (Medical): No  ? Lack of Transportation (Non-Medical): No  ? ? ?CCM Care Plan ? ?Allergies  ?Allergen Reactions  ? Livalo [Pitavastatin] Other (See Comments)  ?  Muscle aches  ? Crestor [Rosuvastatin]   ?  Muscle aches  ? Metformin And Related Diarrhea  ? Pravastatin   ?  Muscle aches  ? ? ?Medications Reviewed Today   ? ? Reviewed by Tomasa Blase, [facility] (Pharmacist) on 09/16/21 at [place] List Status: <None>  ? ?Medication Order Taking? Sig Documenting Provider Last Dose Status Informant  ?Ascorbic Acid (VITAMIN C PO) [id number] Yes Take by mouth. [provider] Taking Active   ?blood glucose meter kit and supplies KIT [id number]  Use to test blood sugar up to twice daily. DX: E11.21 [name], MD  Active   ?celecoxib (CELEBREX) 100 MG capsule [id number] Yes TAKE ONE CAPSULE BY MOUTH TWICE A DAY [name], MD Taking Active   ?clotrimazole-betamethasone (LOTRISONE) cream [id number] Yes Apply 1 application topically 2 (two) times daily. Horald Pollen, MD Taking Active   ?colchicine 0.6 MG tablet [id number] Yes Take 0.6 mg by mouth daily. [provider] Taking Active   ?empagliflozin (JARDIANCE) 25 MG TABS tablet [id number] Yes Take 1 tablet (25 mg total) by mouth daily. Biagio Borg, MD Taking Active   ?gabapentin (NEURONTIN) 100 MG capsule [id number] Yes Take 100 mg by mouth 3 (three) times daily as needed. [provider] Taking Active   ?glucose blood (COOL BLOOD GLUCOSE TEST STRIPS) test strip [id number]  Use to test blood sugar up to twice daily. DX: E11.21 [name], MD  Active    ?Lancets (ONETOUCH ULTRASOFT) lancets [id number]  Use to test blood sugar up to twice daily. DX: E11.21 [name], MD  Active   ?losartan-hydrochlorothiazide [facility]) 100-25 MG tablet [id number] Yes Take 1 tablet by m

## 2021-09-16 NOTE — Patient Instructions (Signed)
Visit Information ? ?Following are the goals we discussed today:  ? ?Manage My Medicine  ? ?Timeframe:  Long-Range Goal ?Priority:  High ?Start Date:    09/14/2020                        ?Expected End Date:       03/16/2022             ? ?Follow Up Date 03/2022 ?  ?- call for medicine refill 2 or 3 days before it runs out ?- call if I am sick and can't take my medicine ?- keep a list of all the medicines I take; vitamins and herbals too ?- use a pillbox to sort medicine  ?  ?Why is this important?   ?These steps will help you keep on track with your medicines. ? ?Plan: Telephone follow up appointment with care management team member scheduled for:  6 months ?The patient has been provided with contact information for the care management team and has been advised to call with any health related questions or concerns.  ? ?Matthew Villanueva, PharmD ?Clinical Pharmacist, [place]  ? ?Please call the care guide team at [phone] if you need to cancel or reschedule your appointment.  ? ?Patient verbalizes understanding of instructions and care plan provided today and agrees to view in [place]. Active MyChart status confirmed with patient.   ? ?

## 2021-09-30 DIAGNOSIS — R051 Acute cough: Secondary | ICD-10-CM | POA: Diagnosis not present

## 2021-10-12 DIAGNOSIS — I10 Essential (primary) hypertension: Secondary | ICD-10-CM | POA: Diagnosis not present

## 2021-10-12 DIAGNOSIS — E1140 Type 2 diabetes mellitus with diabetic neuropathy, unspecified: Secondary | ICD-10-CM | POA: Diagnosis not present

## 2021-10-12 DIAGNOSIS — Z7984 Long term (current) use of oral hypoglycemic drugs: Secondary | ICD-10-CM | POA: Diagnosis not present

## 2021-10-12 DIAGNOSIS — E785 Hyperlipidemia, unspecified: Secondary | ICD-10-CM | POA: Diagnosis not present

## 2021-10-12 DIAGNOSIS — Z87891 Personal history of nicotine dependence: Secondary | ICD-10-CM | POA: Diagnosis not present

## 2021-10-18 DIAGNOSIS — M1990 Unspecified osteoarthritis, unspecified site: Secondary | ICD-10-CM | POA: Diagnosis not present

## 2021-10-18 DIAGNOSIS — Z791 Long term (current) use of non-steroidal anti-inflammatories (NSAID): Secondary | ICD-10-CM | POA: Diagnosis not present

## 2021-10-18 DIAGNOSIS — Z87891 Personal history of nicotine dependence: Secondary | ICD-10-CM | POA: Diagnosis not present

## 2021-10-18 DIAGNOSIS — Z8249 Family history of ischemic heart disease and other diseases of the circulatory system: Secondary | ICD-10-CM | POA: Diagnosis not present

## 2021-10-18 DIAGNOSIS — Z7982 Long term (current) use of aspirin: Secondary | ICD-10-CM | POA: Diagnosis not present

## 2021-10-18 DIAGNOSIS — Z833 Family history of diabetes mellitus: Secondary | ICD-10-CM | POA: Diagnosis not present

## 2021-10-18 DIAGNOSIS — I10 Essential (primary) hypertension: Secondary | ICD-10-CM | POA: Diagnosis not present

## 2021-10-18 DIAGNOSIS — Z6834 Body mass index (BMI) 34.0-34.9, adult: Secondary | ICD-10-CM | POA: Diagnosis not present

## 2021-10-18 DIAGNOSIS — Z7984 Long term (current) use of oral hypoglycemic drugs: Secondary | ICD-10-CM | POA: Diagnosis not present

## 2021-10-18 DIAGNOSIS — E669 Obesity, unspecified: Secondary | ICD-10-CM | POA: Diagnosis not present

## 2021-10-18 DIAGNOSIS — E1140 Type 2 diabetes mellitus with diabetic neuropathy, unspecified: Secondary | ICD-10-CM | POA: Diagnosis not present

## 2021-10-26 ENCOUNTER — Telehealth: Payer: Self-pay | Admitting: Internal Medicine

## 2021-10-26 ENCOUNTER — Other Ambulatory Visit: Payer: Self-pay | Admitting: Internal Medicine

## 2021-10-26 NOTE — Telephone Encounter (Signed)
Medicare INS called to recommenced pt be put on statin medication to help manage his diabetes.   For any questions or concerns please call: [place]: [phone]

## 2021-11-02 NOTE — Telephone Encounter (Signed)
error 

## 2021-11-03 ENCOUNTER — Ambulatory Visit (INDEPENDENT_AMBULATORY_CARE_PROVIDER_SITE_OTHER): Payer: Medicare HMO | Admitting: Emergency Medicine

## 2021-11-03 ENCOUNTER — Encounter: Payer: Self-pay | Admitting: Emergency Medicine

## 2021-11-03 VITALS — BP 110/60 | HR 82 | Temp 98.0°F | Ht 70.0 in | Wt 243.0 lb

## 2021-11-03 DIAGNOSIS — H6123 Impacted cerumen, bilateral: Secondary | ICD-10-CM | POA: Diagnosis not present

## 2021-11-03 NOTE — Progress Notes (Signed)
Matthew Villanueva 86 y.o.   Chief Complaint  Patient presents with   clogged ears   spot on back     HISTORY OF PRESENT ILLNESS: Acute problem visit today.  Patient of Dr. Scarlette Calico. This is a 86 y.o. male complaining of clogged ears with excess wax. Also has some spots on his skin.  Has dermatology appointment next month. No other complaints or medical concerns today.  HPI   Prior to Admission medications   Medication Sig Start Date End Date Taking? Authorizing Provider  Ascorbic Acid (VITAMIN C PO) Take by mouth.   Yes [provider]  blood glucose meter kit and supplies KIT Use to test blood sugar up to twice daily. DX: E11.21 06/14/20  Yes [name], MD  celecoxib (CELEBREX) 100 MG capsule TAKE ONE CAPSULE BY MOUTH TWICE A DAY 03/16/21  Yes [name], MD  clotrimazole-betamethasone (LOTRISONE) cream Apply 1 application topically 2 (two) times daily. 02/01/21  Yes Mahlani Berninger, Ines Bloomer, MD  colchicine 0.6 MG tablet Take 1 tablet (0.6 mg total) by mouth daily. 09/16/21  Yes [name], MD  empagliflozin (JARDIANCE) 25 MG TABS tablet Take 1 tablet (25 mg total) by mouth daily. 07/26/21  Yes Biagio Borg, MD  gabapentin (NEURONTIN) 100 MG capsule Take 100 mg by mouth 3 (three) times daily as needed.   Yes [provider]  glucose blood (COOL BLOOD GLUCOSE TEST STRIPS) test strip Use to test blood sugar up to twice daily. DX: E11.21 06/01/17  Yes [name], MD  Lancets [facility] ULTRASOFT) lancets Use to test blood sugar up to twice daily. DX: E11.21 06/01/17  Yes [name], MD  losartan-hydrochlorothiazide (HYZAAR) 100-25 MG tablet Take 1 tablet by mouth daily. 07/26/21  Yes Biagio Borg, MD  Multiple Vitamin (MULTIVITAMIN) tablet Take 1 tablet by mouth daily.   Yes [provider]  ONETOUCH VERIO test strip USE TO TEST BLOOD SUGAR TWO TIMES A DAY 05/13/21  Yes [name], MD  Semaglutide [facility]) 14 MG TABS 1 tab by  mouth once daily 07/27/21  Yes Biagio Borg, MD    Allergies  Allergen Reactions   Livalo [Pitavastatin] Other (See Comments)    Muscle aches   Crestor [Rosuvastatin]     Muscle aches   Metformin And Related Diarrhea   Pravastatin     Muscle aches    Patient Active Problem List   Diagnosis Date Noted   Back skin lesion 07/30/2021   Generalized OA 12/20/2020   Psoriasis 05/18/2020   Primary osteoarthritis of both hips 03/30/2020   SCC (squamous cell carcinoma), hand, left 04/23/2019   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 04/15/2019   Pure hyperglyceridemia 11/09/2015   OSA (obstructive sleep apnea) 11/09/2015   Statin intolerance 06/10/2014   Chronic painful diabetic neuropathy ([place]) 12/24/2013   Other male erectile dysfunction 12/24/2013   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   Hyperlipidemia with target LDL less than 100 08/12/2013   BPH associated with nocturia 08/12/2013   Morbid obesity ([place]) 05/24/2011   Routine health maintenance 05/24/2011   Hearing loss 04/28/2009   DM (diabetes mellitus), type 2 with renal complications ([place]) 15/83/0940   DJD of shoulder 07/23/2007   Gout 06/25/2007   Essential hypertension 06/25/2007    Past Medical History:  Diagnosis Date   Arthritis    Diabetes mellitus    NO INSULIN   Dyspnea    Gout  History of colonic polyps    HTN (hypertension)    Localized osteoarthrosis not specified whether primary or secondary, hand    OSA (obstructive sleep apnea)    Other specified cardiac dysrhythmias(427.89) 2004   ABLATION DUE TO TACHYCARDIA   Right bundle branch block     Past Surgical History:  Procedure Laterality Date   CARDIAC ELECTROPHYSIOLOGY STUDY AND ABLATION  2004   ablation for SVT with a chronic right bundle branch block    HEMORRHOID SURGERY  1960   PILONIDAL CYST DRAINAGE      Social History   Socioeconomic History   Marital status: Single    Spouse name: Not on file   Number of children: Not  on file   Years of education: 16   Highest education level: Not on file  Occupational History   Occupation: self employed, Chemical engineer: SELF-EMPLOYED  Tobacco Use   Smoking status: Former    Packs/day: 2.00    Years: 30.00    Total pack years: 60.00    Types: Cigarettes    Quit date: 05/15/1984    Years since quitting: 37.4   Smokeless tobacco: Never  Vaping Use   Vaping Use: Never used  Substance and Sexual Activity   Alcohol use: Yes    Comment: Rarely   Drug use: No   Sexual activity: Not Currently  Other Topics Concern   Not on file  Social History Narrative   HSG, [place]   Single   Work: self employed...Recruitment consultant   I-ADLs   End-of-Life: Yes-CPR, Yes-short-term Mechanical Ventilation, but no prolonged ventilation. Is willing to undergo dialysis, prolonged tub feeding.   Pt states former smoker. 1986. 2ppd. Started age 70   Social Determinants of Health   Financial Resource Strain: Low Risk  (12/15/2020)   Overall Financial Resource Strain (CARDIA)    Difficulty of Paying Living Expenses: Not hard at all  Food Insecurity: No Food Insecurity (12/15/2020)   Hunger Vital Sign    Worried About Running Out of Food in the Last Year: Never true    [place] in the Last Year: Never true  Transportation Needs: No Transportation Needs (12/15/2020)   PRAPARE - Hydrologist (Medical): No    Lack of Transportation (Non-Medical): No  Physical Activity: Inactive (12/15/2020)   Exercise Vital Sign    Days of Exercise per Week: 0 days    Minutes of Exercise per Session: 0 min  Stress: No Stress Concern Present (12/15/2020)   [place]    Feeling of Stress : Not at all  Social Connections: Moderately Integrated (12/15/2020)   Social Connection and Isolation Panel [NHANES]    Frequency of Communication with Friends and Family: More than three times a week     Frequency of Social Gatherings with Friends and Family: More than three times a week    Attends Religious Services: 1 to 4 times per year    Active Member of [employer] or Organizations: Yes    Attends Archivist Meetings: 1 to 4 times per year    Marital Status: Never married  Intimate Partner Violence: Not At Risk (12/15/2020)   Humiliation, Afraid, Rape, and Kick questionnaire    Fear of Current or Ex-Partner: No    Emotionally Abused: No    Physically Abused: No    Sexually Abused: No    Family History  Problem Relation Age  of Onset   Cancer Father        throat and tongue   Heart disease Mother    Lung cancer Sister    Diabetes Maternal Uncle    Heart attack Maternal Grandmother    Heart disease Maternal Grandmother        CAD/MI   Heart disease Maternal Grandfather    Heart disease Maternal Aunt        CAD     Review of Systems  Constitutional: Negative.  Negative for chills and fever.  HENT:  Positive for hearing loss. Negative for congestion and sore throat.   Respiratory: Negative.  Negative for cough and shortness of breath.   Cardiovascular: Negative.  Negative for chest pain and palpitations.  Gastrointestinal:  Negative for abdominal pain, diarrhea, nausea and vomiting.  Skin: Negative.  Negative for rash.  Neurological:  Negative for dizziness and headaches.  All other systems reviewed and are negative.  Today's Vitals   11/03/21 1428  BP: 110/60  Pulse: 82  Temp: 98 F (36.7 C)  TempSrc: Oral  SpO2: 95%  Weight: 243 lb (110.2 kg)  Height: $Remove'5\' 10"'Luueqjx$  (1.778 m)   Body mass index is 34.87 kg/m.   Physical Exam Vitals reviewed.  Constitutional:      Appearance: Normal appearance.  HENT:     Head: Normocephalic.     Right Ear: There is impacted cerumen.     Left Ear: There is impacted cerumen.     Mouth/Throat:     Mouth: Mucous membranes are moist.     Pharynx: Oropharynx is clear.  Eyes:     Extraocular Movements: Extraocular movements  intact.     Pupils: Pupils are equal, round, and reactive to light.  Cardiovascular:     Rate and Rhythm: Normal rate and regular rhythm.     Pulses: Normal pulses.     Heart sounds: Normal heart sounds.  Pulmonary:     Effort: Pulmonary effort is normal.     Breath sounds: Normal breath sounds.  Musculoskeletal:     Cervical back: No tenderness.  Lymphadenopathy:     Cervical: No cervical adenopathy.  Skin:    General: Skin is warm and dry.     Capillary Refill: Capillary refill takes less than 2 seconds.     Comments: No malignant looking skin lesions on back  Neurological:     General: No focal deficit present.     Mental Status: He is alert and oriented to person, place, and time.  Psychiatric:        Mood and Affect: Mood normal.        Behavior: Behavior normal.    Ceruminosis is noted.  Wax is removed by syringing and manual debridement. Instructions for home care to prevent wax buildup are given.   ASSESSMENT & PLAN: Problem List Items Addressed This Visit   None Visit Diagnoses     Bilateral impacted cerumen    -  Primary   Relevant Orders   Ear Lavage      Patient Instructions  Earwax Buildup, Adult The ears produce a substance called earwax that helps keep bacteria out of the ear and protects the skin in the ear canal. Occasionally, earwax can build up in the ear and cause discomfort or hearing loss. What are the causes? This condition is caused by a buildup of earwax. Ear canals are self-cleaning. Ear wax is made in the outer part of the ear canal and generally falls out in small  amounts over time. When the self-cleaning mechanism is not working, earwax builds up and can cause decreased hearing and discomfort. Attempting to clean ears with cotton swabs can push the earwax deep into the ear canal and cause decreased hearing and pain. What increases the risk? This condition is more likely to develop in people who: Clean their ears often with cotton  swabs. Pick at their ears. Use earplugs or in-ear headphones often, or wear hearing aids. The following factors may also make you more likely to develop this condition: Being male. Being of older age. Naturally producing more earwax. Having narrow ear canals. Having earwax that is overly thick or sticky. Having excess hair in the ear canal. Having eczema. Being dehydrated. What are the signs or symptoms? Symptoms of this condition include: Reduced or muffled hearing. A feeling of fullness in the ear or feeling that the ear is plugged. Fluid coming from the ear. Ear pain or an itchy ear. Ringing in the ear. Coughing. Balance problems. An obvious piece of earwax that can be seen inside the ear canal. How is this diagnosed? This condition may be diagnosed based on: Your symptoms. Your medical history. An ear exam. During the exam, your health care provider will look into your ear with an instrument called an otoscope. You may have tests, including a hearing test. How is this treated? This condition may be treated by: Using ear drops to soften the earwax. Having the earwax removed by a health care provider. The health care provider may: Flush the ear with water. Use an instrument that has a loop on the end (curette). Use a suction device. Having surgery to remove the wax buildup. This may be done in severe cases. Follow these instructions at home:  Take over-the-counter and prescription medicines only as told by your health care provider. Do not put any objects, including cotton swabs, into your ear. You can clean the opening of your ear canal with a washcloth or facial tissue. Follow instructions from your health care provider about cleaning your ears. Do not overclean your ears. Drink enough fluid to keep your urine pale yellow. This will help to thin the earwax. Keep all follow-up visits as told. If earwax builds up in your ears often or if you use hearing aids, consider  seeing your health care provider for routine, preventive ear cleanings. Ask your health care provider how often you should schedule your cleanings. If you have hearing aids, clean them according to instructions from the manufacturer and your health care provider. Contact a health care provider if: You have ear pain. You develop a fever. You have pus or other fluid coming from your ear. You have hearing loss. You have ringing in your ears that does not go away. You feel like the room is spinning (vertigo). Your symptoms do not improve with treatment. Get help right away if: You have bleeding from the affected ear. You have severe ear pain. Summary Earwax can build up in the ear and cause discomfort or hearing loss. The most common symptoms of this condition include reduced or muffled hearing, a feeling of fullness in the ear, or feeling that the ear is plugged. This condition may be diagnosed based on your symptoms, your medical history, and an ear exam. This condition may be treated by using ear drops to soften the earwax or by having the earwax removed by a health care provider. Do not put any objects, including cotton swabs, into your ear. You can clean the opening  of your ear canal with a washcloth or facial tissue. This information is not intended to replace advice given to you by your health care provider. Make sure you discuss any questions you have with your health care provider. Document Revised: 08/19/2019 Document Reviewed: 08/19/2019 Elsevier Patient Education  [place], MD [facility]

## 2021-11-03 NOTE — Patient Instructions (Signed)

## 2021-11-22 DIAGNOSIS — L814 Other melanin hyperpigmentation: Secondary | ICD-10-CM | POA: Diagnosis not present

## 2021-11-22 DIAGNOSIS — L821 Other seborrheic keratosis: Secondary | ICD-10-CM | POA: Diagnosis not present

## 2021-11-22 DIAGNOSIS — N481 Balanitis: Secondary | ICD-10-CM | POA: Diagnosis not present

## 2021-11-22 DIAGNOSIS — L400 Psoriasis vulgaris: Secondary | ICD-10-CM | POA: Diagnosis not present

## 2021-11-22 DIAGNOSIS — D225 Melanocytic nevi of trunk: Secondary | ICD-10-CM | POA: Diagnosis not present

## 2021-12-02 ENCOUNTER — Ambulatory Visit (INDEPENDENT_AMBULATORY_CARE_PROVIDER_SITE_OTHER): Payer: Medicare HMO

## 2021-12-02 ENCOUNTER — Encounter: Payer: Self-pay | Admitting: Podiatry

## 2021-12-02 ENCOUNTER — Ambulatory Visit: Payer: Medicare HMO | Admitting: Podiatry

## 2021-12-02 DIAGNOSIS — E1149 Type 2 diabetes mellitus with other diabetic neurological complication: Secondary | ICD-10-CM

## 2021-12-02 DIAGNOSIS — L84 Corns and callosities: Secondary | ICD-10-CM

## 2021-12-02 DIAGNOSIS — S90851A Superficial foreign body, right foot, initial encounter: Secondary | ICD-10-CM

## 2021-12-02 DIAGNOSIS — E1140 Type 2 diabetes mellitus with diabetic neuropathy, unspecified: Secondary | ICD-10-CM

## 2021-12-05 NOTE — Progress Notes (Signed)
Subjective:   Patient ID: Matthew Villanueva, male   DOB: 86 y.o.   MRN: [id number]   HPI Patient states he has had a lesion on the bottom of his right foot that he is concerned about having long-term diabetes with neuropathy and states that he tried a medicated pad on it without any relief or getting rid of it.  Patient does not smoke likes to be active if possible   Review of Systems  All other systems reviewed and are negative.       Objective:  Physical Exam Vitals and nursing note reviewed.  Constitutional:      Appearance: He is well-developed.  Pulmonary:     Effort: Pulmonary effort is normal.  Musculoskeletal:        General: Normal range of motion.  Skin:    General: Skin is warm.  Neurological:     Mental Status: He is alert.     Neurovascular status intact muscle strength found to be adequate range of motion within normal limits.  Patient is found to have lesion plantar aspect right foot that is painful and just localized no erythema edema drainage surrounding it with patient found to have diminishment of sharp dull vibratory noted bilateral.  Good digital perfusion well oriented x3      Assessment:  Lesion formation plantar aspect right with long-term history of diabetes and neuropathy     Plan:  H&P reviewed with him generalized diabetic care and I recommended daily inspections of his feet.  I then went ahead and I did sharp sterile debridement of the lesion no iatrogenic bleeding and discussed not using medicated pads on this.  Patient will be seen back as needed  X-rays indicate that there is no indication that there is a significant bone process here it appears to be an interchange between bone and soft tissue

## 2022-01-05 ENCOUNTER — Ambulatory Visit (INDEPENDENT_AMBULATORY_CARE_PROVIDER_SITE_OTHER): Payer: Medicare HMO | Admitting: Internal Medicine

## 2022-01-05 VITALS — BP 122/64 | HR 74 | Temp 97.8°F | Ht 70.0 in | Wt 244.0 lb

## 2022-01-05 DIAGNOSIS — E538 Deficiency of other specified B group vitamins: Secondary | ICD-10-CM

## 2022-01-05 DIAGNOSIS — Z0001 Encounter for general adult medical examination with abnormal findings: Secondary | ICD-10-CM

## 2022-01-05 DIAGNOSIS — E1121 Type 2 diabetes mellitus with diabetic nephropathy: Secondary | ICD-10-CM | POA: Diagnosis not present

## 2022-01-05 DIAGNOSIS — M10071 Idiopathic gout, right ankle and foot: Secondary | ICD-10-CM

## 2022-01-05 DIAGNOSIS — G629 Polyneuropathy, unspecified: Secondary | ICD-10-CM | POA: Insufficient documentation

## 2022-01-05 DIAGNOSIS — M1A39X0 Chronic gout due to renal impairment, multiple sites, without tophus (tophi): Secondary | ICD-10-CM | POA: Diagnosis not present

## 2022-01-05 DIAGNOSIS — H9193 Unspecified hearing loss, bilateral: Secondary | ICD-10-CM | POA: Insufficient documentation

## 2022-01-05 DIAGNOSIS — I10 Essential (primary) hypertension: Secondary | ICD-10-CM

## 2022-01-05 DIAGNOSIS — H900 Conductive hearing loss, bilateral: Secondary | ICD-10-CM

## 2022-01-05 DIAGNOSIS — E559 Vitamin D deficiency, unspecified: Secondary | ICD-10-CM

## 2022-01-05 DIAGNOSIS — E785 Hyperlipidemia, unspecified: Secondary | ICD-10-CM | POA: Diagnosis not present

## 2022-01-05 LAB — LIPID PANEL
Cholesterol: 141 mg/dL (ref 0–200)
HDL: 38.3 mg/dL — ABNORMAL LOW (ref >39.00)
NonHDL: 102.74
Total CHOL/HDL Ratio: 4
Triglycerides: 214 mg/dL — ABNORMAL HIGH (ref 0.0–149.0)
VLDL: 42.8 mg/dL — ABNORMAL HIGH (ref 0.0–40.0)

## 2022-01-05 LAB — CBC WITH DIFFERENTIAL/PLATELET
Basophils Absolute: 0.1 10*3/uL (ref 0.0–0.1)
Basophils Relative: 0.7 % (ref 0.0–3.0)
Eosinophils Absolute: 0.1 10*3/uL (ref 0.0–0.7)
Eosinophils Relative: 1.2 % (ref 0.0–5.0)
HCT: 50.9 % (ref 39.0–52.0)
Hemoglobin: 17.1 g/dL — ABNORMAL HIGH (ref 13.0–17.0)
Lymphocytes Relative: 18 % (ref 12.0–46.0)
Lymphs Abs: 1.5 10*3/uL (ref 0.7–4.0)
MCHC: 33.5 g/dL (ref 30.0–36.0)
MCV: 88.4 fl (ref 78.0–100.0)
Monocytes Absolute: 0.8 10*3/uL (ref 0.1–1.0)
Monocytes Relative: 9.1 % (ref 3.0–12.0)
Neutro Abs: 6.1 10*3/uL (ref 1.4–7.7)
Neutrophils Relative %: 71 % (ref 43.0–77.0)
Platelets: 238 10*3/uL (ref 150.0–400.0)
RBC: 5.76 Mil/uL (ref 4.22–5.81)
RDW: 14.8 % (ref 11.5–15.5)
WBC: 8.6 10*3/uL (ref 4.0–10.5)

## 2022-01-05 LAB — BASIC METABOLIC PANEL
BUN: 22 mg/dL (ref 6–23)
CO2: 28 mEq/L (ref 19–32)
Calcium: 9.3 mg/dL (ref 8.4–10.5)
Chloride: 98 mEq/L (ref 96–112)
Creatinine, Ser: 1.13 mg/dL (ref 0.40–1.50)
GFR: 59.96 mL/min — ABNORMAL LOW (ref >60.00)
Glucose, Bld: 178 mg/dL — ABNORMAL HIGH (ref 70–99)
Potassium: 4 mEq/L (ref 3.5–5.1)
Sodium: 136 mEq/L (ref 135–145)

## 2022-01-05 LAB — MICROALBUMIN / CREATININE URINE RATIO
Creatinine,U: 57 mg/dL
Microalb Creat Ratio: 13.9 mg/g (ref 0.0–30.0)
Microalb, Ur: 7.9 mg/dL — ABNORMAL HIGH (ref 0.0–1.9)

## 2022-01-05 LAB — URINALYSIS, ROUTINE W REFLEX MICROSCOPIC
Bilirubin Urine: NEGATIVE
Hgb urine dipstick: NEGATIVE
Ketones, ur: NEGATIVE
Leukocytes,Ua: NEGATIVE
Nitrite: NEGATIVE
Specific Gravity, Urine: 1.015 (ref 1.000–1.030)
Total Protein, Urine: NEGATIVE
Urine Glucose: >=1000 — AB
Urobilinogen, UA: 0.2 (ref 0.0–1.0)
WBC, UA: NONE SEEN (ref 0–?)
pH: 5.5 (ref 5.0–8.0)

## 2022-01-05 LAB — HEPATIC FUNCTION PANEL
ALT: 23 U/L (ref 0–53)
AST: 18 U/L (ref 0–37)
Albumin: 4 g/dL (ref 3.5–5.2)
Alkaline Phosphatase: 19 U/L — ABNORMAL LOW (ref 39–117)
Bilirubin, Direct: 0.1 mg/dL (ref 0.0–0.3)
Total Bilirubin: 0.7 mg/dL (ref 0.2–1.2)
Total Protein: 7.2 g/dL (ref 6.0–8.3)

## 2022-01-05 LAB — HEMOGLOBIN A1C: Hgb A1c MFr Bld: 7.8 % — ABNORMAL HIGH (ref 4.6–6.5)

## 2022-01-05 LAB — URIC ACID: Uric Acid, Serum: 6.8 mg/dL (ref 4.0–7.8)

## 2022-01-05 LAB — TSH: TSH: 1.15 u[IU]/mL (ref 0.35–5.50)

## 2022-01-05 LAB — VITAMIN B12: Vitamin B-12: 229 pg/mL (ref 211–911)

## 2022-01-05 LAB — VITAMIN D 25 HYDROXY (VIT D DEFICIENCY, FRACTURES): VITD: 21.88 ng/mL — ABNORMAL LOW (ref 30.00–100.00)

## 2022-01-05 LAB — LDL CHOLESTEROL, DIRECT: Direct LDL: 81 mg/dL

## 2022-01-05 NOTE — Patient Instructions (Signed)
Your ears were cleared with irrigation today  Please continue all other medications as before, and refills have been done if requested.  Please have the pharmacy call with any other refills you may need.  Please continue your efforts at being more active, low cholesterol diet, and weight control.  You are otherwise up to date with prevention measures today.  Please keep your appointments with your specialists as you may have planned  Please go to the LAB at the blood drawing area for the tests to be done  You will be contacted by phone if any changes need to be made immediately.  Otherwise, you will receive a letter about your results with an explanation, but please check with MyChart first.  Please remember to sign up for MyChart if you have not done so, as this will be important to you in the future with finding out test results, communicating by private email, and scheduling acute appointments online when needed.  Please make an Appointment to return in 6 months, or sooner if needed

## 2022-01-05 NOTE — Progress Notes (Signed)
Patient ID: Matthew Villanueva, male   DOB: 13-May-1936, 86 y.o.   MRN: [id number]         Chief Complaint:: wellness exam as PCP not available today       HPI:  Matthew Villanueva is a 86 y.o. male here for wellness exam; declines covid booster and flu shot, plans to have shingrix at the pharmacy, o/w up to date                        Also tolerating increased rybelsus 14 mg but not lost any wt recently. Saw podiatry last wk with known neuropathy, but circulation appeared intact. Pt denies chest pain, increased sob or doe, wheezing, orthopnea, PND, increased LE swelling, palpitations, dizziness or syncope.   Pt denies polydipsia, polyuria, or new focal neuro s/s.    Pt denies fever, wt loss, night sweats, loss of appetite, or other constitutional symptoms  Has had worsening hearing loss to the right ear in the past wk.     Wt Readings from Last 3 Encounters:  01/05/22 244 lb (110.7 kg)  11/03/21 243 lb (110.2 kg)  07/26/21 248 lb (112.5 kg)   BP Readings from Last 3 Encounters:  01/05/22 122/64  11/03/21 110/60  07/26/21 126/80   Immunization History  Administered Date(s) Administered   Fluad Quad(high Dose 65+) 02/17/2019, 02/01/2021   H1N1 04/21/2008   Influenza Whole 04/01/2007, 02/21/2010, 02/13/2011, 03/18/2012   Influenza, High Dose Seasonal PF 03/06/2016, 01/10/2017, 02/01/2018   Influenza,inj,Quad PF,6+ Mos 01/20/2013, 05/04/2015, 01/13/2020   PFIZER(Purple Top)SARS-COV-2 Vaccination 07/06/2019, 08/03/2019, 02/13/2020, 11/11/2020   Pneumococcal Conjugate-13 08/12/2013   Pneumococcal Polysaccharide-23 03/07/2004, 05/17/2017   Td 07/04/2004, 08/16/2020   Tdap 05/04/2015   Zoster Recombinat (Shingrix) 03/08/2019   Zoster, Live 04/29/2010   There are no preventive care reminders to display for this patient.     Past Medical History:  Diagnosis Date   Arthritis    Diabetes mellitus    NO INSULIN   Dyspnea    Gout    History of colonic polyps    HTN (hypertension)     Localized osteoarthrosis not specified whether primary or secondary, hand    OSA (obstructive sleep apnea)    Other specified cardiac dysrhythmias(427.89) 2004   ABLATION DUE TO TACHYCARDIA   Right bundle branch block    Past Surgical History:  Procedure Laterality Date   CARDIAC ELECTROPHYSIOLOGY STUDY AND ABLATION  2004   ablation for SVT with a chronic right bundle branch block    HEMORRHOID SURGERY  1960   PILONIDAL CYST DRAINAGE      reports that he quit smoking about 37 years ago. His smoking use included cigarettes. He has a 60.00 pack-year smoking history. He has never used smokeless tobacco. He reports current alcohol use. He reports that he does not use drugs. family history includes Cancer in his father; Diabetes in his maternal uncle; Heart attack in his maternal grandmother; Heart disease in his maternal aunt, maternal grandfather, maternal grandmother, and mother; Lung cancer in his sister. Allergies  Allergen Reactions   Livalo [Pitavastatin] Other (See Comments)    Muscle aches   Crestor [Rosuvastatin]     Muscle aches   Metformin And Related Diarrhea   Pravastatin     Muscle aches   Current Outpatient Medications on File Prior to Visit  Medication Sig Dispense Refill   Ascorbic Acid (VITAMIN C PO) Take by mouth.     blood glucose meter kit and  supplies KIT Use to test blood sugar up to twice daily. DX: E11.21 1 each 0   celecoxib (CELEBREX) 100 MG capsule TAKE ONE CAPSULE BY MOUTH TWICE A DAY 180 capsule 0   clotrimazole-betamethasone (LOTRISONE) cream Apply 1 application topically 2 (two) times daily. 30 g 3   colchicine 0.6 MG tablet Take 1 tablet (0.6 mg total) by mouth daily. 90 tablet 0   empagliflozin (JARDIANCE) 25 MG TABS tablet Take 1 tablet (25 mg total) by mouth daily. 90 tablet 1   gabapentin (NEURONTIN) 100 MG capsule Take 100 mg by mouth 3 (three) times daily as needed.     glucose blood (COOL BLOOD GLUCOSE TEST STRIPS) test strip Use to test blood  sugar up to twice daily. DX: E11.21 100 each 12   Lancets (ONETOUCH ULTRASOFT) lancets Use to test blood sugar up to twice daily. DX: E11.21 100 each 12   losartan-hydrochlorothiazide (HYZAAR) 100-25 MG tablet Take 1 tablet by mouth daily. 90 tablet 1   Multiple Vitamin (MULTIVITAMIN) tablet Take 1 tablet by mouth daily.     ONETOUCH VERIO test strip USE TO TEST BLOOD SUGAR TWO TIMES A DAY 150 strip 5   Semaglutide (RYBELSUS) 14 MG TABS 1 tab by mouth once daily 90 tablet 3   No current facility-administered medications on file prior to visit.        ROS:  All others reviewed and negative.  Objective        PE:  BP 122/64 (BP Location: Right Arm, Patient Position: Sitting, Cuff Size: Large)   Pulse 74   Temp 97.8 F (36.6 C) (Oral)   Ht $R'5\' 10"'QO$  (1.778 m)   Wt 244 lb (110.7 kg)   SpO2 91%   BMI 35.01 kg/m                 Constitutional: Pt appears in NAD               HENT: Head: NCAT.                Right Ear: External ear normal.                 Left Ear: External ear normal. Right and left canal wax impaction resolved with irrigation and hearing improved               Eyes: . Pupils are equal, round, and reactive to light. Conjunctivae and EOM are normal               Nose: without d/c or deformity               Neck: Neck supple. Gross normal ROM               Cardiovascular: Normal rate and regular rhythm.                 Pulmonary/Chest: Effort normal and breath sounds without rales or wheezing.                Abd:  Soft, NT, ND, + BS, no organomegaly               Neurological: Pt is alert. At baseline orientation, motor grossly intact               Skin: Skin is warm. No rashes, no other new lesions, LE edema - none               Psychiatric: Pt behavior is  normal without agitation   Micro: none  Cardiac tracings I have personally interpreted today:  none  Pertinent Radiological findings (summarize): none   Lab Results  Component Value Date   WBC 8.6 01/05/2022    HGB 17.1 (H) 01/05/2022   HCT 50.9 01/05/2022   PLT 238.0 01/05/2022   GLUCOSE 178 (H) 01/05/2022   CHOL 141 01/05/2022   TRIG 214.0 (H) 01/05/2022   HDL 38.30 (L) 01/05/2022   LDLDIRECT 81.0 01/05/2022   LDLCALC 81 12/16/2019   ALT 23 01/05/2022   AST 18 01/05/2022   NA 136 01/05/2022   K 4.0 01/05/2022   CL 98 01/05/2022   CREATININE 1.13 01/05/2022   BUN 22 01/05/2022   CO2 28 01/05/2022   TSH 1.15 01/05/2022   PSA 2.86 12/03/2017   HGBA1C 7.8 (H) 01/05/2022   MICROALBUR 7.9 (H) 01/05/2022   Assessment/Plan:  Matthew Villanueva is a 86 y.o. White or Caucasian [1] male with  has a past medical history of Arthritis, Diabetes mellitus, Dyspnea, Gout, History of colonic polyps, HTN (hypertension), Localized osteoarthrosis not specified whether primary or secondary, hand, OSA (obstructive sleep apnea), Other specified cardiac dysrhythmias(427.89) (2004), and Right bundle branch block.  Encounter for well adult exam with abnormal findings Age and sex appropriate education and counseling updated with regular exercise and diet Referrals for preventative services - none needed Immunizations addressed - declines covid booster, flu shot, and shingrx for now Smoking counseling  - none needed Evidence for depression or other mood disorder - none significant Most recent labs reviewed. I have personally reviewed and have noted: 1) the patient's medical and social history 2) The patient's current medications and supplements 3) The patient's height, weight, and BMI have been recorded in the chart   Bilateral hearing loss Right canal wax impaction resolved with irrigation and hearing improved  Ceruminosis is noted.  Wax is removed by syringing and manual debridement. Instructions for home care to prevent wax buildup are given.   DM (diabetes mellitus), type 2 with renal complications (HCC) Lab Results  Component Value Date   HGBA1C 7.8 (H) 01/05/2022   uncontrolled, pt to continue  current medical treatment jardiance 25 mg qd, rybelsus 14 mg qd as declines change today   Essential hypertension BP Readings from Last 3 Encounters:  01/05/22 122/64  11/03/21 110/60  07/26/21 126/80   Stable, pt to continue medical treatment hyzaar 100 - 25 mg qd   Hyperlipidemia with target LDL less than 100 Lab Results  Component Value Date   LDLCALC 81 12/16/2019   Uncontrolled, goal ldl < 70, pt to continue current low chol diet, declines statin for now   Gout Asympt, for uric acid with labs  Followup: Return in about 6 months (around 07/08/2022).  Cathlean Cower, MD 01/08/2022 3:23 PM [facility]

## 2022-01-06 ENCOUNTER — Ambulatory Visit: Payer: Medicare HMO

## 2022-01-08 ENCOUNTER — Encounter: Payer: Self-pay | Admitting: Internal Medicine

## 2022-01-08 NOTE — Assessment & Plan Note (Signed)
Lab Results  Component Value Date   HGBA1C 7.8 (H) 01/05/2022   uncontrolled, pt to continue current medical treatment jardiance 25 mg qd, rybelsus 14 mg qd as declines change today

## 2022-01-08 NOTE — Assessment & Plan Note (Signed)
Asympt, for uric acid with labs

## 2022-01-08 NOTE — Assessment & Plan Note (Signed)
Lab Results  Component Value Date   LDLCALC 81 12/16/2019   Uncontrolled, goal ldl < 70, pt to continue current low chol diet, declines statin for now

## 2022-01-08 NOTE — Assessment & Plan Note (Signed)
BP Readings from Last 3 Encounters:  01/05/22 122/64  11/03/21 110/60  07/26/21 126/80   Stable, pt to continue medical treatment hyzaar 100 - 25 mg qd

## 2022-01-08 NOTE — Assessment & Plan Note (Signed)
Right canal wax impaction resolved with irrigation and hearing improved  Ceruminosis is noted.  Wax is removed by syringing and manual debridement. Instructions for home care to prevent wax buildup are given.

## 2022-01-08 NOTE — Assessment & Plan Note (Signed)
Age and sex appropriate education and counseling updated with regular exercise and diet Referrals for preventative services - none needed Immunizations addressed - declines covid booster, flu shot, and shingrx for now Smoking counseling  - none needed Evidence for depression or other mood disorder - none significant Most recent labs reviewed. I have personally reviewed and have noted: 1) the patient's medical and social history 2) The patient's current medications and supplements 3) The patient's height, weight, and BMI have been recorded in the chart

## 2022-01-19 ENCOUNTER — Other Ambulatory Visit: Payer: Self-pay | Admitting: Internal Medicine

## 2022-01-19 DIAGNOSIS — I10 Essential (primary) hypertension: Secondary | ICD-10-CM

## 2022-01-19 DIAGNOSIS — E1121 Type 2 diabetes mellitus with diabetic nephropathy: Secondary | ICD-10-CM

## 2022-01-19 NOTE — Telephone Encounter (Signed)
Please refill as per office routine med refill policy (all routine meds to be refilled for 3 mo or monthly (per pt preference) up to one year from last visit, then month to month grace period for 3 mo, then further med refills will have to be denied) ? ?

## 2022-01-26 ENCOUNTER — Encounter: Payer: Self-pay | Admitting: Podiatry

## 2022-01-26 ENCOUNTER — Ambulatory Visit: Payer: Medicare HMO | Admitting: Podiatry

## 2022-01-26 DIAGNOSIS — B070 Plantar wart: Secondary | ICD-10-CM | POA: Diagnosis not present

## 2022-01-26 DIAGNOSIS — E1140 Type 2 diabetes mellitus with diabetic neuropathy, unspecified: Secondary | ICD-10-CM

## 2022-01-26 DIAGNOSIS — E1149 Type 2 diabetes mellitus with other diabetic neurological complication: Secondary | ICD-10-CM

## 2022-01-26 DIAGNOSIS — S90851A Superficial foreign body, right foot, initial encounter: Secondary | ICD-10-CM

## 2022-01-26 NOTE — Progress Notes (Signed)
Subjective:   Patient ID: Matthew Villanueva, male   DOB: 86 y.o.   MRN: [id number]   HPI Patient states that he has had reoccurrence of irritation on the bottom of his right foot.  Does not feel like he had tremendous progress the first time it was probably some better but still is bothersome and it recently has been bothersome again more   ROS      Objective:  Physical Exam  Neurovascular status intact with patient having the area plantar aspect right measuring about 5 x 5 mm with a darkish discoloration localized no erythema edema surrounding it and painful when pressed directly with patient having no history of foreign body     Assessment:  Difficult to make determination as to whether this could be a verruca porokeratotic lesion a foreign body that he was not aware of or other pathology     Plan:  I reviewed that with him I did debride out the tissue I was not able to find foreign body currently there was a lesion present and I went ahead and applied immune agent cried create immune response with sterile dressing and I want him to use padding around it along with soaks.  He will be seen back to recheck and I gave strict instructions if any erythema edema drainage or swelling were to occur he is to reappoint immediately

## 2022-03-01 ENCOUNTER — Other Ambulatory Visit: Payer: Self-pay | Admitting: Internal Medicine

## 2022-03-01 DIAGNOSIS — E1121 Type 2 diabetes mellitus with diabetic nephropathy: Secondary | ICD-10-CM

## 2022-03-01 NOTE — Telephone Encounter (Signed)
Please refill as per office routine med refill policy (all routine meds to be refilled for 3 mo or monthly (per pt preference) up to one year from last visit, then month to month grace period for 3 mo, then further med refills will have to be denied) ? ?

## 2022-03-06 ENCOUNTER — Telehealth: Payer: Self-pay | Admitting: Internal Medicine

## 2022-03-06 NOTE — Telephone Encounter (Signed)
Notified pt--not due for the pneumonia shot. Pt verified One touch verio flex meter cover with insurance. Please advise

## 2022-03-06 NOTE — Telephone Encounter (Signed)
Patient would like to know if he is due for a pneumonia vaccine - Please call patient and let him know.  Patient would also like a new blood glucose meter kit and supplies sent to his pharmacy - [facility] at [facility].

## 2022-03-07 ENCOUNTER — Other Ambulatory Visit: Payer: Self-pay | Admitting: Internal Medicine

## 2022-03-08 ENCOUNTER — Telehealth: Payer: Self-pay

## 2022-03-08 NOTE — Telephone Encounter (Signed)
Mailed Novo Nordisk renewal application to patient home. 

## 2022-03-15 ENCOUNTER — Ambulatory Visit: Payer: Medicare HMO | Admitting: Nurse Practitioner

## 2022-03-17 ENCOUNTER — Ambulatory Visit: Payer: Medicare HMO | Admitting: Physician Assistant

## 2022-03-21 ENCOUNTER — Ambulatory Visit: Payer: Medicare HMO | Attending: Nurse Practitioner | Admitting: Student

## 2022-03-21 ENCOUNTER — Encounter: Payer: Self-pay | Admitting: Student

## 2022-03-21 VITALS — BP 124/68 | HR 75 | Ht 70.0 in | Wt 247.0 lb

## 2022-03-21 DIAGNOSIS — E119 Type 2 diabetes mellitus without complications: Secondary | ICD-10-CM | POA: Diagnosis not present

## 2022-03-21 DIAGNOSIS — I6523 Occlusion and stenosis of bilateral carotid arteries: Secondary | ICD-10-CM

## 2022-03-21 DIAGNOSIS — G4733 Obstructive sleep apnea (adult) (pediatric): Secondary | ICD-10-CM

## 2022-03-21 DIAGNOSIS — E781 Pure hyperglyceridemia: Secondary | ICD-10-CM | POA: Diagnosis not present

## 2022-03-21 DIAGNOSIS — I471 Supraventricular tachycardia, unspecified: Secondary | ICD-10-CM

## 2022-03-21 DIAGNOSIS — I10 Essential (primary) hypertension: Secondary | ICD-10-CM | POA: Diagnosis not present

## 2022-03-21 DIAGNOSIS — I4510 Unspecified right bundle-branch block: Secondary | ICD-10-CM | POA: Diagnosis not present

## 2022-03-21 NOTE — Progress Notes (Signed)
  Cardiology Clinic Note   Patient Name: Matthew Villanueva Date of Encounter: 03/21/2022  Primary Care Provider:  Jones, Thomas L, MD Primary Cardiologist:  Peter Nishan, MD  Patient Profile    [name] is an 86 year-old male with a past medical history of SVT s/p ablation in [place], RBBB, OSA not on CPAP, hypertension, bilateral carotid artery disease, history of TIA, and T2DM who presents to the clinic today for 1 year follow-up of chronic cardiac conditions.  Past Medical History    Past Medical History:  Diagnosis Date   Arthritis    Diabetes mellitus    NO INSULIN   Dyspnea    Gout    History of colonic polyps    HTN (hypertension)    Localized osteoarthrosis not specified whether primary or secondary, hand    OSA (obstructive sleep apnea)    Other specified cardiac dysrhythmias(427.89) 2004   ABLATION DUE TO TACHYCARDIA   Right bundle branch block    Past Surgical History:  Procedure Laterality Date   CARDIAC ELECTROPHYSIOLOGY STUDY AND ABLATION  2004   ablation for SVT with a chronic right bundle branch block    HEMORRHOID SURGERY  1960   PILONIDAL CYST DRAINAGE      Allergies  Allergies  Allergen Reactions   Livalo [Pitavastatin] Other (See Comments)    Muscle aches   Crestor [Rosuvastatin]     Muscle aches   Metformin And Related Diarrhea   Pravastatin     Muscle aches    History of Present Illness    [name] has a past medical history of: SVT. S/p ablation in [place] 2004 RBBB. Chronic, stable.  Nuclear stress test 03/16/2016: Low risk nuclear study with normal perfusion and normal left ventricular regional and global systolic function.  EF 54%. Echo 03/27/2016: EF 55 to 60%.  Mild concentric LVH.  Grade I DD.  Mildly calcified aortic valve leaflets.  Mitral valve with calcified annulus and mildly thickened leaflets. Hypertension. Hypertriglyceridemia. Last lipid panel (nonfasting) 01/05/2022: LDL 81,HDL 38, triglycerides 214, total  cholesterol 141. OSA. He has not used a CPAP in years.  He reports he attempted to use it but could not tolerate it. T2DM. Last A1C 01/05/2022: 7.8. Bilateral carotid artery disease. History of TIA. Carotid ultrasound 06/11/2012: Mild heterogeneous plaque noted bilaterally.  0 to 39% bilateral ICA stenosis.  Recommend follow-up in 1 year.  [name] has been a patient of Dr. Nishan.  He has a previous cardiac history in [place].  He was last seen on 02/25/2021 by Dr. Nishan.  He was doing well at that time.  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  All medications were continued at that time and he was scheduled to follow-up in 1 year.  Today, patient patient reports he is doing very well.  He denies palpitations, chest pain, shortness of breath, DOE, or lower extremity edema.  He denies bradycardia, dizziness, lightheadedness, or syncope.  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  He has a friend drive him to [place].  He does report some mild swelling when he sits with legs in a dependent position for long periods of time.  He does try to elevate his legs when he notices them getting puffy and it resolves.  He does not exercise.  He follows a low-sodium low sugar diet as best he can.  He reports that his sometimes difficult   when he is traveling for work.  Home Medications    Current Meds  Medication Sig   Ascorbic Acid (VITAMIN C PO) Take by mouth.   blood glucose meter kit and supplies KIT Use to test blood sugar up to twice daily. DX: E11.21   celecoxib (CELEBREX) 100 MG capsule TAKE ONE CAPSULE BY MOUTH TWICE A DAY   clotrimazole-betamethasone (LOTRISONE) cream Apply 1 application topically 2 (two) times daily.   colchicine 0.6 MG tablet Take 1 tablet (0.6 mg total) by mouth daily.   empagliflozin (JARDIANCE) 25 MG TABS tablet TAKE ONE TABLET BY MOUTH DAILY   gabapentin (NEURONTIN) 100 MG  capsule Take 100 mg by mouth 3 (three) times daily as needed.   glucose blood (COOL BLOOD GLUCOSE TEST STRIPS) test strip Use to test blood sugar up to twice daily. DX: E11.21   Lancets (ONETOUCH ULTRASOFT) lancets Use to test blood sugar up to twice daily. DX: E11.21   losartan-hydrochlorothiazide (HYZAAR) 100-25 MG tablet TAKE ONE TABLET BY MOUTH DAILY   Multiple Vitamin (MULTIVITAMIN) tablet Take 1 tablet by mouth daily.   nystatin cream (MYCOSTATIN) Apply 1 Application topically 2 (two) times daily.   ONETOUCH VERIO test strip USE TO TEST BLOOD SUGAR TWO TIMES A DAY   Semaglutide (RYBELSUS) 14 MG TABS 1 tab by mouth once daily    Family History    Family History  Problem Relation Age of Onset   Cancer Father        throat and tongue   Heart disease Mother    Lung cancer Sister    Diabetes Maternal Uncle    Heart attack Maternal Grandmother    Heart disease Maternal Grandmother        CAD/MI   Heart disease Maternal Grandfather    Heart disease Maternal Aunt        CAD   He indicated that his mother is deceased. He indicated that his father is deceased. He indicated that only one of his two sisters is alive. He indicated that his maternal grandmother is deceased. He indicated that his maternal grandfather is deceased. He indicated that his paternal grandmother is deceased. He indicated that his paternal grandfather is deceased. He indicated that his maternal aunt is deceased. He indicated that his maternal uncle is deceased.   Social History    Social History   Socioeconomic History   Marital status: Single    Spouse name: Not on file   Number of children: Not on file   Years of education: 16   Highest education level: Not on file  Occupational History   Occupation: self employed, sales fabric    Employer: SELF-EMPLOYED  Tobacco Use   Smoking status: Former    Packs/day: 2.00    Years: 30.00    Total pack years: 60.00    Types: Cigarettes    Quit date: 05/15/1984     Years since quitting: 37.8   Smokeless tobacco: Never  Vaping Use   Vaping Use: Never used  Substance and Sexual Activity   Alcohol use: Yes    Comment: Rarely   Drug use: No   Sexual activity: Not Currently  Other Topics Concern   Not on file  Social History Narrative   HS[employer], [employer]   Single   Work: self employed...sales fabric   I-ADLs   End-of-Life: Yes-CPR, Yes-short-term Mechanical Ventilation, but no prolonged ventilation. Is willing to undergo dialysis, prolonged tub feeding.   Pt states former smoker. 1986. 2ppd. Started age 19     Social Determinants of Health   Financial Resource Strain: Low Risk  (12/15/2020)   Overall Financial Resource Strain (CARDIA)    Difficulty of Paying Living Expenses: Not hard at all  Food Insecurity: No Food Insecurity (12/15/2020)   Hunger Vital Sign    Worried About Running Out of Food in the Last Year: Never true    Ran Out of Food in the Last Year: Never true  Transportation Needs: No Transportation Needs (12/15/2020)   PRAPARE - Transportation    Lack of Transportation (Medical): No    Lack of Transportation (Non-Medical): No  Physical Activity: Inactive (12/15/2020)   Exercise Vital Sign    Days of Exercise per Week: 0 days    Minutes of Exercise per Session: 0 min  Stress: No Stress Concern Present (12/15/2020)   [employer] - Occupational Stress Questionnaire    Feeling of Stress : Not at all  Social Connections: Moderately Integrated (12/15/2020)   Social Connection and Isolation Panel [NHANES]    Frequency of Communication with Friends and Family: More than three times a week    Frequency of Social Gatherings with Friends and Family: More than three times a week    Attends Religious Services: 1 to 4 times per year    Active Member of Clubs or Organizations: Yes    Attends Club or Organization Meetings: 1 to 4 times per year    Marital Status: Never married  Intimate Partner Violence: Not At Risk  (12/15/2020)   Humiliation, Afraid, Rape, and Kick questionnaire    Fear of Current or Ex-Partner: No    Emotionally Abused: No    Physically Abused: No    Sexually Abused: No     Review of Systems    General:  No chills, fever, night sweats or weight changes.  Cardiovascular:  No chest pain, dyspnea on exertion, edema, orthopnea, palpitations, paroxysmal nocturnal dyspnea. Dermatological: No rash, lesions/masses Respiratory: No cough, dyspnea Urologic: No hematuria, dysuria Abdominal:   No nausea, vomiting, diarrhea, bright red blood per rectum, melena, or hematemesis Neurologic:  No visual changes, weakness, changes in mental status. All other systems reviewed and are otherwise negative except as noted above.  Physical Exam    VS:  BP 124/68   Pulse 75   Ht 5' 10" (1.778 m)   Wt 247 lb (112 kg)   SpO2 94%   BMI 35.44 kg/m  , BMI Body mass index is 35.44 kg/m. GEN:  Well nourished, well developed, in no acute distress. HEENT: Normal. Neck: Supple, no JVD, carotid bruits, or masses. Cardiac: RRR, no murmurs, rubs, or gallops. No clubbing, cyanosis, edema.  Radials/DP/PT 2+ and equal bilaterally.  Respiratory:  Respirations regular and unlabored, clear to auscultation bilaterally. GI: Soft, nontender, nondistended. MS: No deformity or atrophy. Skin: Warm and dry, no rash. Neuro: Strength and sensation are intact. Psych: Normal affect.  Accessory Clinical Findings   Recent Labs: 01/05/2022: ALT 23; BUN 22; Creatinine, Ser 1.13; Hemoglobin 17.1; Platelets 238.0; Potassium 4.0; Sodium 136; TSH 1.15   Recent Lipid Panel    Component Value Date/Time   CHOL 141 01/05/2022 0956   TRIG 214.0 (H) 01/05/2022 0956   HDL 38.30 (L) 01/05/2022 0956   CHOLHDL 4 01/05/2022 0956   VLDL 42.8 (H) 01/05/2022 0956   LDLCALC 81 12/16/2019 1140   LDLDIRECT 81.0 01/05/2022 0956       ECG personally reviewed by me today normal sinus rhythm, RBBB, heart rate 83.  Unchanged from  12/20/2020.      Assessment & Plan   SVT/RBBB.  He is status post ablation in [place] in 2004.  He has had no issues with palpitations or racing heart rate since.  Right bundle branch block appears chronic and stable.  Today shows normal sinus rhythm with right bundle branch block and heart rate of 83.  It is unchanged from prior EKGs. He has not required any AVN blocking agents since ablation. Hypertension.  Patient's BP today is 124/68.  Denies headaches or vision changes.  He will continue losartan/HCTZ 100/25 mg daily. Hypertriglyceridemia without hypercholesterolemia.  Last lipid panel on 01/05/2022 was nonfasting and showed LDL 81, HDL 38, triglycerides 214, total cholesterol 141.  He is followed by his PCP for this.  He is intolerant to statins.  May benefit from Zetia.  Will await results of carotid ultrasound. Bilateral carotid artery disease.  Patient has a remote history of TIA.  He denies any recurrence.  His last carotid ultrasound was 06/03/2012 which showed mild heterogeneous plaque bilaterally.  0 to 39% bilateral ICA stenosis.  It was recommended he follow-up in 1 year.  He has not had a repeat ultrasound since that time.  We will order that today.  Disposition: Carotid ultrasound for bilateral carotid artery disease.  May begin Zetia based on results.  Follow-up with Dr. Johnsie Cancel in 1 year.   Justice Britain. Almetta Liddicoat, NP-C     03/21/2022, 3:52 PM [facility] [address] Office [phone] Fax [phone]   I spent 12 minutes examining this patient, reviewing medications, and using patient centered shared decision making involving her cardiac care.  Prior to her visit I spent greater than 20 minutes reviewing her past medical history,  medications, and prior cardiac tests.

## 2022-03-21 NOTE — Patient Instructions (Signed)
Medication Instructions:   Your physician recommends that you continue on your current medications as directed. Please refer to the Current Medication list given to you today.   *If you need a refill on your cardiac medications before your next appointment, please call your pharmacy*   Lab Work: [place]    If you have labs (blood work) drawn today and your tests are completely normal, you will receive your results only by: Impact (if you have MyChart) OR A paper copy in the mail If you have any lab test that is abnormal or we need to change your treatment, we will call you to review the results.   Testing/Procedures: Your physician has requested that you have a carotid duplex. This test is an ultrasound of the carotid arteries in your neck. It looks at blood flow through these arteries that supply the brain with blood. Allow one hour for this exam. There are no restrictions or special instructions.   Follow-Up: At [facility], you and your health needs are our priority.  As part of our continuing mission to provide you with exceptional heart care, we have created designated Provider Care Teams.  These Care Teams include your primary Cardiologist (physician) and Advanced Practice Providers (APPs -  Physician Assistants and Nurse Practitioners) who all work together to provide you with the care you need, when you need it.  We recommend signing up for the patient portal called "MyChart".  Sign up information is provided on this After Visit Summary.  MyChart is used to connect with patients for Virtual Visits (Telemedicine).  Patients are able to view lab/test results, encounter notes, upcoming appointments, etc.  Non-urgent messages can be sent to your provider as well.   To learn more about what you can do with MyChart, go to [email or web address].    Your next appointment:   1 year(s)  The format for your next appointment:   In Person  Provider:  Dr.  Acie Fredrickson   Other Instructions   Important Information About Sugar

## 2022-04-04 ENCOUNTER — Ambulatory Visit (HOSPITAL_COMMUNITY)
Admission: RE | Admit: 2022-04-04 | Discharge: 2022-04-04 | Disposition: A | Discharge status: Home / Self Care | Payer: Medicare HMO | Source: Ambulatory Visit | Attending: Internal Medicine | Admitting: Internal Medicine

## 2022-04-04 DIAGNOSIS — I6523 Occlusion and stenosis of bilateral carotid arteries: Secondary | ICD-10-CM | POA: Insufficient documentation

## 2022-04-05 ENCOUNTER — Telehealth: Payer: Self-pay

## 2022-04-05 DIAGNOSIS — I6523 Occlusion and stenosis of bilateral carotid arteries: Secondary | ICD-10-CM

## 2022-04-05 MED ORDER — EZETIMIBE 10 MG PO TABS: 10.0000 mg | ORAL_TABLET | Freq: Every day | ORAL | 3 refills | Status: DC

## 2022-04-05 NOTE — Telephone Encounter (Signed)
The patient has been notified of the result and verbalized understanding.  All questions (if any) were answered. Bernestine Amass, RN 04/05/2022 11:13 AM  Zetia has been sent in. Lab work has been scheduled.

## 2022-04-05 NOTE — Telephone Encounter (Signed)
-----   Message from Mayra Reel, NP sent at 04/05/2022  8:05 AM EST ----- Please let patient know carotid ultrasound showed some narrowing in bilateral carotids that is about the same as it was in 2014. Given these results and his LDL not at goal (he is at 81, goal <70) I would like him to start Zetia 10 mg daily. Please let him know this is not a statin so he should be able to tolerate it without side effects. Ask him to come in 8 weeks for lipid panel and LFTs.   Thank you!

## 2022-04-17 DIAGNOSIS — I10 Essential (primary) hypertension: Secondary | ICD-10-CM | POA: Diagnosis not present

## 2022-04-17 DIAGNOSIS — H6123 Impacted cerumen, bilateral: Secondary | ICD-10-CM | POA: Diagnosis not present

## 2022-04-17 DIAGNOSIS — Z6835 Body mass index (BMI) 35.0-35.9, adult: Secondary | ICD-10-CM | POA: Diagnosis not present

## 2022-05-31 ENCOUNTER — Telehealth: Payer: Self-pay | Admitting: Internal Medicine

## 2022-05-31 DIAGNOSIS — R051 Acute cough: Secondary | ICD-10-CM | POA: Diagnosis not present

## 2022-05-31 DIAGNOSIS — R5383 Other fatigue: Secondary | ICD-10-CM | POA: Diagnosis not present

## 2022-05-31 DIAGNOSIS — U071 COVID-19: Secondary | ICD-10-CM | POA: Diagnosis not present

## 2022-05-31 NOTE — Telephone Encounter (Signed)
Notified pt inform he will need to have an appt. Next appt is w/ Dr. Jenny Reichmann on 06/02/21 @ 1:40.Marland KitchenJohny Chess

## 2022-05-31 NOTE — Telephone Encounter (Signed)
Patient called and stated he has a dry cough, sneezing, and a runny nose for the last 2 days. He tried to take a COVID test but couldn't get the results. He would like to know if something can be prescribed for him or should he come in to be seen A good callback number is [phone].

## 2022-06-02 ENCOUNTER — Ambulatory Visit (INDEPENDENT_AMBULATORY_CARE_PROVIDER_SITE_OTHER): Payer: Medicare HMO | Admitting: Internal Medicine

## 2022-06-02 ENCOUNTER — Telehealth: Payer: Self-pay | Admitting: Internal Medicine

## 2022-06-02 VITALS — BP 120/68 | HR 66 | Temp 98.0°F | Ht 70.0 in | Wt 242.8 lb

## 2022-06-02 DIAGNOSIS — M10071 Idiopathic gout, right ankle and foot: Secondary | ICD-10-CM

## 2022-06-02 DIAGNOSIS — U071 COVID-19: Secondary | ICD-10-CM | POA: Diagnosis not present

## 2022-06-02 DIAGNOSIS — M1A39X0 Chronic gout due to renal impairment, multiple sites, without tophus (tophi): Secondary | ICD-10-CM

## 2022-06-02 DIAGNOSIS — E1121 Type 2 diabetes mellitus with diabetic nephropathy: Secondary | ICD-10-CM | POA: Diagnosis not present

## 2022-06-02 DIAGNOSIS — I10 Essential (primary) hypertension: Secondary | ICD-10-CM

## 2022-06-02 MED ORDER — MOLNUPIRAVIR EUA 200MG CAPSULE: 4.0000 | ORAL_CAPSULE | Freq: Two times a day (BID) | ORAL | 0 refills | Status: AC

## 2022-06-02 MED ORDER — COLCHICINE 0.6 MG PO TABS: 0.6000 mg | ORAL_TABLET | Freq: Every day | ORAL | 0 refills | Status: AC

## 2022-06-02 NOTE — Telephone Encounter (Signed)
Sorry, there is no specific tx except the paxlovid or molnupiravir - I have nothing else to offer.

## 2022-06-02 NOTE — Telephone Encounter (Signed)
Patient called and said that the medication sent in for covid was not covered by insurance and was over a $1000 and he asked if something else can be sent in. He asked for a call back to let him know

## 2022-06-02 NOTE — Patient Instructions (Addendum)
Since the Paxlovid is so expensive, Please take all new medication as prescribed - molnupiravir  Please continue all other medications as before, and refills have been done if requested - colchicine  Please have the pharmacy call with any other refills you may need.  Please continue your efforts at being more active, low cholesterol diet, and weight control.  You are otherwise up to date with prevention measures today.  Please keep your appointments with your specialists as you may have planned  No other lab work needed today  Please make an Appointment to return in 3 months, or sooner if needed, to Dr Ronnald Ramp

## 2022-06-02 NOTE — Progress Notes (Signed)
Patient ID: Matthew Villanueva, male   DOB: May 24, 1935, 87 y.o.   MRN: [id number]        Chief Complaint: follow up recent onsest covid infection, gout, dm, htn       HPI:  Matthew Villanueva is a 87 y.o. male here with recent onset covid infection tx with paxlovid, but too expensive with his insurance.  Still with 3 days fatigue, scant prod cough, last covid booster was dec 2023.  Pt denies chest pain, increased sob or doe, wheezing, orthopnea, PND, increased LE swelling, palpitations, dizziness or syncope.   Pt denies polydipsia, polyuria, or new focal neuro s/s.    Pt denies recent wt loss, night sweats, loss of appetite, or other constitutional symptoms  Did have twinge of gout arthritis in the past wk, now improved, but needs colchicine refill.         Wt Readings from Last 3 Encounters:  06/02/22 242 lb 12.8 oz (110.1 kg)  03/21/22 247 lb (112 kg)  01/05/22 244 lb (110.7 kg)   BP Readings from Last 3 Encounters:  06/02/22 120/68  03/21/22 124/68  01/05/22 122/64         Past Medical History:  Diagnosis Date   Arthritis    Diabetes mellitus    NO INSULIN   Dyspnea    Gout    History of colonic polyps    HTN (hypertension)    Localized osteoarthrosis not specified whether primary or secondary, hand    OSA (obstructive sleep apnea)    Other specified cardiac dysrhythmias(427.89) 2004   ABLATION DUE TO TACHYCARDIA   Right bundle branch block    Past Surgical History:  Procedure Laterality Date   CARDIAC ELECTROPHYSIOLOGY STUDY AND ABLATION  2004   ablation for SVT with a chronic right bundle branch block    HEMORRHOID SURGERY  1960   PILONIDAL CYST DRAINAGE      reports that he quit smoking about 38 years ago. His smoking use included cigarettes. He has a 60.00 pack-year smoking history. He has never used smokeless tobacco. He reports current alcohol use. He reports that he does not use drugs. family history includes Cancer in his father; Diabetes in his maternal uncle;  Heart attack in his maternal grandmother; Heart disease in his maternal aunt, maternal grandfather, maternal grandmother, and mother; Lung cancer in his sister. Allergies  Allergen Reactions   Livalo [Pitavastatin] Other (See Comments)    Muscle aches   Crestor [Rosuvastatin]     Muscle aches   Metformin And Related Diarrhea   Pravastatin     Muscle aches   Current Outpatient Medications on File Prior to Visit  Medication Sig Dispense Refill   Ascorbic Acid (VITAMIN C PO) Take by mouth.     blood glucose meter kit and supplies KIT Use to test blood sugar up to twice daily. DX: E11.21 1 each 0   celecoxib (CELEBREX) 100 MG capsule TAKE ONE CAPSULE BY MOUTH TWICE A DAY 180 capsule 0   clotrimazole-betamethasone (LOTRISONE) cream Apply 1 application topically 2 (two) times daily. 30 g 3   empagliflozin (JARDIANCE) 25 MG TABS tablet TAKE ONE TABLET BY MOUTH DAILY 30 tablet 3   ezetimibe (ZETIA) 10 MG tablet Take 1 tablet (10 mg total) by mouth daily. 90 tablet 3   gabapentin (NEURONTIN) 100 MG capsule Take 100 mg by mouth 3 (three) times daily as needed.     glucose blood (COOL BLOOD GLUCOSE TEST STRIPS) test strip Use to test blood  sugar up to twice daily. DX: E11.21 100 each 12   Lancets (ONETOUCH ULTRASOFT) lancets Use to test blood sugar up to twice daily. DX: E11.21 100 each 12   losartan-hydrochlorothiazide (HYZAAR) 100-25 MG tablet TAKE ONE TABLET BY MOUTH DAILY 90 tablet 1   Multiple Vitamin (MULTIVITAMIN) tablet Take 1 tablet by mouth daily.     nystatin cream (MYCOSTATIN) Apply 1 Application topically 2 (two) times daily.     ONETOUCH VERIO test strip USE TO TEST BLOOD SUGAR TWO TIMES A DAY 150 strip 5   Semaglutide (RYBELSUS) 14 MG TABS 1 tab by mouth once daily 90 tablet 3   No current facility-administered medications on file prior to visit.        ROS:  All others reviewed and negative.  Objective        PE:  BP 120/68 (BP Location: Left Arm, Patient Position: Sitting,  Cuff Size: Large)   Pulse 66   Temp 98 F (36.7 C) (Oral)   Ht '5\' 10"'$  (1.778 m)   Wt 242 lb 12.8 oz (110.1 kg)   SpO2 94%   BMI 34.84 kg/m                 Constitutional: Pt appears in NAD               HENT: Head: NCAT.                Right Ear: External ear normal.                 Left Ear: External ear normal.   Bilat tm's with mild erythema.  Max sinus areas mild tender.  Pharynx with mild erythema, no exudate               Eyes: . Pupils are equal, round, and reactive to light. Conjunctivae and EOM are normal               Nose: without d/c or deformity               Neck: Neck supple. Gross normal ROM               Cardiovascular: Normal rate and regular rhythm.                 Pulmonary/Chest: Effort normal and breath sounds without rales or wheezing.                Abd:  Soft, NT, ND, + BS, no organomegaly               Neurological: Pt is alert. At baseline orientation, motor grossly intact               Skin: Skin is warm. No rashes, no other new lesions, LE edema - none               Psychiatric: Pt behavior is normal without agitation   Micro: none  Cardiac tracings I have personally interpreted today:  none  Pertinent Radiological findings (summarize): none   Lab Results  Component Value Date   WBC 8.6 01/05/2022   HGB 17.1 (H) 01/05/2022   HCT 50.9 01/05/2022   PLT 238.0 01/05/2022   GLUCOSE 178 (H) 01/05/2022   CHOL 141 01/05/2022   TRIG 214.0 (H) 01/05/2022   HDL 38.30 (L) 01/05/2022   LDLDIRECT 81.0 01/05/2022   LDLCALC 81 12/16/2019   ALT 23 01/05/2022   AST 18 01/05/2022   NA  136 01/05/2022   K 4.0 01/05/2022   CL 98 01/05/2022   CREATININE 1.13 01/05/2022   BUN 22 01/05/2022   CO2 28 01/05/2022   TSH 1.15 01/05/2022   PSA 2.86 12/03/2017   HGBA1C 7.8 (H) 01/05/2022   MICROALBUR 7.9 (H) 01/05/2022   Assessment/Plan:  Matthew Villanueva is a 87 y.o. White or Caucasian [1] male with  has a past medical history of Arthritis, Diabetes mellitus,  Dyspnea, Gout, History of colonic polyps, HTN (hypertension), Localized osteoarthrosis not specified whether primary or secondary, hand, OSA (obstructive sleep apnea), Other specified cardiac dysrhythmias(427.89) (2004), and Right bundle branch block.  DM (diabetes mellitus), type 2 with renal complications (HCC) Lab Results  Component Value Date   HGBA1C 7.8 (H) 01/05/2022   Uncontrolled, pt to continue current medical treatment jardiance 25 mg qd, rybelsus 14 qd, cont to work on wt loss   Essential hypertension BP Readings from Last 3 Encounters:  06/02/22 120/68  03/21/22 124/68  01/05/22 122/64   Stable, pt to continue medical treatment hyzaar 100 25 mg qd   COVID-19 virus infection Mild to mod, for antibx course with change paxlovid to molnupiravir so hopefully less expensive,  to f/u any worsening symptoms or concerns  Gout Ok for colchicine refill  Followup: Return in 3 months (on 09/01/2022), or if symptoms worsen or fail to improve.  Cathlean Cower, MD 06/03/2022 7:51 PM [facility]

## 2022-06-03 ENCOUNTER — Encounter: Payer: Self-pay | Admitting: Internal Medicine

## 2022-06-03 NOTE — Assessment & Plan Note (Signed)
Lab Results  Component Value Date   HGBA1C 7.8 (H) 01/05/2022   Uncontrolled, pt to continue current medical treatment jardiance 25 mg qd, rybelsus 14 qd, cont to work on wt loss

## 2022-06-03 NOTE — Assessment & Plan Note (Signed)
Ok for colchicine refill

## 2022-06-03 NOTE — Assessment & Plan Note (Signed)
BP Readings from Last 3 Encounters:  06/02/22 120/68  03/21/22 124/68  01/05/22 122/64   Stable, pt to continue medical treatment hyzaar 100 25 mg qd

## 2022-06-03 NOTE — Assessment & Plan Note (Signed)
Mild to mod, for antibx course with change paxlovid to molnupiravir so hopefully less expensive,  to f/u any worsening symptoms or concerns

## 2022-06-05 ENCOUNTER — Ambulatory Visit: Payer: Medicare HMO | Attending: Student

## 2022-06-05 DIAGNOSIS — I6523 Occlusion and stenosis of bilateral carotid arteries: Secondary | ICD-10-CM | POA: Diagnosis not present

## 2022-06-05 LAB — HEPATIC FUNCTION PANEL
ALT: 18 IU/L (ref 0–44)
AST: 14 IU/L (ref 0–40)
Albumin: 3.9 g/dL (ref 3.7–4.7)
Alkaline Phosphatase: 25 IU/L — ABNORMAL LOW (ref 44–121)
Bilirubin Total: 0.4 mg/dL (ref 0.0–1.2)
Bilirubin, Direct: 0.14 mg/dL (ref 0.00–0.40)
Total Protein: 6.9 g/dL (ref 6.0–8.5)

## 2022-06-05 LAB — LIPID PANEL
Chol/HDL Ratio: 3.3 ratio (ref 0.0–5.0)
Cholesterol, Total: 124 mg/dL (ref 100–199)
HDL: 38 mg/dL — ABNORMAL LOW (ref >39)
LDL Chol Calc (NIH): 59 mg/dL (ref 0–99)
Triglycerides: 159 mg/dL — ABNORMAL HIGH (ref 0–149)
VLDL Cholesterol Cal: 27 mg/dL (ref 5–40)

## 2022-06-10 ENCOUNTER — Other Ambulatory Visit: Payer: Self-pay | Admitting: Internal Medicine

## 2022-06-10 DIAGNOSIS — E1121 Type 2 diabetes mellitus with diabetic nephropathy: Secondary | ICD-10-CM

## 2022-06-14 NOTE — Telephone Encounter (Signed)
Received pt portion PAP FOR RYBELSUS application/ faxing provider portion to office please review and sign and fax back to [phone] ATTENTION Suliman Termini L.

## 2022-06-20 ENCOUNTER — Telehealth: Payer: Self-pay | Admitting: Internal Medicine

## 2022-06-20 NOTE — Telephone Encounter (Signed)
Elmyra Ricks, a patient advocate was returning Aubrey's call. She would like a callback at [phone]

## 2022-06-20 NOTE — Telephone Encounter (Signed)
Returned call to Elmyra Ricks, a copy of allergy and med list faxed

## 2022-06-20 NOTE — Telephone Encounter (Signed)
Submitted RENEWAL application for RYBELSUS  to [place] for patient assistance.   Phone: [phone]  PLEASE BE ADVISE D  [place]!  Sandre [facility] Patient Advocate

## 2022-07-03 ENCOUNTER — Other Ambulatory Visit: Payer: Self-pay | Admitting: Internal Medicine

## 2022-07-03 DIAGNOSIS — E1121 Type 2 diabetes mellitus with diabetic nephropathy: Secondary | ICD-10-CM

## 2022-07-03 NOTE — Telephone Encounter (Signed)
Please refill as per office routine med refill policy (all routine meds to be refilled for 3 mo or monthly (per pt preference) up to one year from last visit, then month to month grace period for 3 mo, then further med refills will have to be denied) ? ?

## 2022-07-06 ENCOUNTER — Ambulatory Visit: Payer: Medicare HMO | Admitting: Internal Medicine

## 2022-07-19 ENCOUNTER — Other Ambulatory Visit: Payer: Self-pay | Admitting: Internal Medicine

## 2022-07-19 DIAGNOSIS — E1121 Type 2 diabetes mellitus with diabetic nephropathy: Secondary | ICD-10-CM

## 2022-07-19 DIAGNOSIS — I10 Essential (primary) hypertension: Secondary | ICD-10-CM

## 2022-07-19 NOTE — Telephone Encounter (Signed)
Please refill as per office routine med refill policy (all routine meds to be refilled for 3 mo or monthly (per pt preference) up to one year from last visit, then month to month grace period for 3 mo, then further med refills will have to be denied) ? ?

## 2022-07-27 ENCOUNTER — Telehealth: Payer: Self-pay

## 2022-07-27 ENCOUNTER — Encounter: Payer: Self-pay | Admitting: Internal Medicine

## 2022-07-27 ENCOUNTER — Ambulatory Visit (INDEPENDENT_AMBULATORY_CARE_PROVIDER_SITE_OTHER): Payer: Medicare HMO | Admitting: Internal Medicine

## 2022-07-27 VITALS — BP 124/66 | HR 62 | Temp 98.2°F | Ht 70.0 in | Wt 243.0 lb

## 2022-07-27 DIAGNOSIS — L409 Psoriasis, unspecified: Secondary | ICD-10-CM

## 2022-07-27 DIAGNOSIS — E1121 Type 2 diabetes mellitus with diabetic nephropathy: Secondary | ICD-10-CM

## 2022-07-27 DIAGNOSIS — E1140 Type 2 diabetes mellitus with diabetic neuropathy, unspecified: Secondary | ICD-10-CM

## 2022-07-27 DIAGNOSIS — N481 Balanitis: Secondary | ICD-10-CM | POA: Diagnosis not present

## 2022-07-27 DIAGNOSIS — H6122 Impacted cerumen, left ear: Secondary | ICD-10-CM | POA: Diagnosis not present

## 2022-07-27 DIAGNOSIS — I10 Essential (primary) hypertension: Secondary | ICD-10-CM

## 2022-07-27 LAB — CBC WITH DIFFERENTIAL/PLATELET
Basophils Absolute: 0.1 10*3/uL (ref 0.0–0.1)
Basophils Relative: 1 % (ref 0.0–3.0)
Eosinophils Absolute: 0.2 10*3/uL (ref 0.0–0.7)
Eosinophils Relative: 2.2 % (ref 0.0–5.0)
HCT: 49.9 % (ref 39.0–52.0)
Hemoglobin: 17.3 g/dL — ABNORMAL HIGH (ref 13.0–17.0)
Lymphocytes Relative: 22.3 % (ref 12.0–46.0)
Lymphs Abs: 2.2 10*3/uL (ref 0.7–4.0)
MCHC: 34.6 g/dL (ref 30.0–36.0)
MCV: 89 fl (ref 78.0–100.0)
Monocytes Absolute: 0.8 10*3/uL (ref 0.1–1.0)
Monocytes Relative: 8.1 % (ref 3.0–12.0)
Neutro Abs: 6.6 10*3/uL (ref 1.4–7.7)
Neutrophils Relative %: 66.4 % (ref 43.0–77.0)
Platelets: 236 10*3/uL (ref 150.0–400.0)
RBC: 5.61 Mil/uL (ref 4.22–5.81)
RDW: 14.7 % (ref 11.5–15.5)
WBC: 9.9 10*3/uL (ref 4.0–10.5)

## 2022-07-27 LAB — BASIC METABOLIC PANEL
BUN: 19 mg/dL (ref 6–23)
CO2: 27 mEq/L (ref 19–32)
Calcium: 9.5 mg/dL (ref 8.4–10.5)
Chloride: 99 mEq/L (ref 96–112)
Creatinine, Ser: 1.11 mg/dL (ref 0.40–1.50)
GFR: 61.02 mL/min (ref >60.00)
Glucose, Bld: 133 mg/dL — ABNORMAL HIGH (ref 70–99)
Potassium: 4 mEq/L (ref 3.5–5.1)
Sodium: 137 mEq/L (ref 135–145)

## 2022-07-27 LAB — HEMOGLOBIN A1C: Hgb A1c MFr Bld: 7.7 % — ABNORMAL HIGH (ref 4.6–6.5)

## 2022-07-27 MED ORDER — PREGABALIN 25 MG PO CAPS: 25.0000 mg | ORAL_CAPSULE | Freq: Two times a day (BID) | ORAL | 0 refills | Status: DC

## 2022-07-27 MED ORDER — CLOTRIMAZOLE-BETAMETHASONE 1-0.05 % EX CREA: 1.0000 | TOPICAL_CREAM | Freq: Two times a day (BID) | CUTANEOUS | 3 refills | Status: DC

## 2022-07-27 MED ORDER — NYSTATIN 100000 UNIT/GM EX CREA: 1.0000 | TOPICAL_CREAM | Freq: Two times a day (BID) | CUTANEOUS | 2 refills | Status: DC

## 2022-07-27 NOTE — Telephone Encounter (Signed)
Key: [id number]

## 2022-07-27 NOTE — Progress Notes (Signed)
PRE-PROCEDURE EXAM: Left & Right TM cannot be visualized due to total occlusion/impaction of the ear canal.  PROCEDURE INDICATION: remove wax to visualize ear drum & relieve discomfort  CONSENT:  Verbal     PROCEDURE NOTE:     BILATERAL EAR:  I used warm water irrigation under direct visualization with the otoscope to free the wax bolus from the ear canal.    POST- PROCEDURE EXAM: TMs successfully visualized and found to have no erythema     The patient tolerated the procedure well.

## 2022-07-27 NOTE — Progress Notes (Signed)
Subjective:  Patient ID: Matthew Villanueva, male    DOB: 10-May-1936  Age: 87 y.o. MRN: [id number]  CC: Hypertension and Diabetes   HPI Matthew Villanueva presents for f/up  ---  He is active and denies DOE, CP, diaphoresis, edema. He complains of diabetic nerve pain.  Outpatient Medications Prior to Visit  Medication Sig Dispense Refill   blood glucose meter kit and supplies KIT Use to test blood sugar up to twice daily. DX: E11.21 1 each 0   celecoxib (CELEBREX) 100 MG capsule TAKE ONE CAPSULE BY MOUTH TWICE A DAY 180 capsule 0   colchicine 0.6 MG tablet Take 1 tablet (0.6 mg total) by mouth daily. 90 tablet 0   glucose blood (COOL BLOOD GLUCOSE TEST STRIPS) test strip Use to test blood sugar up to twice daily. DX: E11.21 100 each 12   JARDIANCE 25 MG TABS tablet TAKE 1 TABLET BY MOUTH DAILY 30 tablet 3   Lancets (ONETOUCH ULTRASOFT) lancets Use to test blood sugar up to twice daily. DX: E11.21 100 each 12   losartan-hydrochlorothiazide (HYZAAR) 100-25 MG tablet TAKE 1 TABLET BY MOUTH DAILY 90 tablet 1   Multiple Vitamin (MULTIVITAMIN) tablet Take 1 tablet by mouth daily.     ONETOUCH VERIO test strip USE TO TEST BLOOD SUGAR TWO TIMES A DAY 150 strip 5   Semaglutide (RYBELSUS) 14 MG TABS 1 tab by mouth once daily 90 tablet 3   Ascorbic Acid (VITAMIN C PO) Take by mouth.     clotrimazole-betamethasone (LOTRISONE) cream Apply 1 application topically 2 (two) times daily. 30 g 3   ezetimibe (ZETIA) 10 MG tablet Take 1 tablet (10 mg total) by mouth daily. 90 tablet 3   gabapentin (NEURONTIN) 100 MG capsule Take 100 mg by mouth 3 (three) times daily as needed.     nystatin cream (MYCOSTATIN) Apply 1 Application topically 2 (two) times daily.     No facility-administered medications prior to visit.    ROS Review of Systems  Constitutional: Negative.  Negative for diaphoresis and fatigue.  HENT:  Positive for hearing loss. Negative for sinus pressure.   Eyes: Negative.  Negative  for visual disturbance.  Respiratory:  Negative for cough, chest tightness, shortness of breath and wheezing.   Cardiovascular:  Negative for chest pain, palpitations and leg swelling.  Gastrointestinal:  Negative for abdominal pain, diarrhea, nausea and vomiting.  Endocrine: Negative.   Genitourinary: Negative.  Negative for difficulty urinating.  Musculoskeletal: Negative.  Negative for arthralgias.  Skin: Negative.   Neurological: Negative.  Negative for dizziness, weakness and light-headedness.  Hematological:  Negative for adenopathy. Does not bruise/bleed easily.  Psychiatric/Behavioral: Negative.      Objective:  BP 124/66 (BP Location: Left Arm, Patient Position: Sitting, Cuff Size: Large)   Pulse 62   Temp 98.2 F (36.8 C) (Oral)   Ht 5\' 10"  (1.778 m)   Wt 243 lb (110.2 kg)   SpO2 90%   BMI 34.87 kg/m   BP Readings from Last 3 Encounters:  07/27/22 124/66  06/02/22 120/68  03/21/22 124/68    Wt Readings from Last 3 Encounters:  07/27/22 243 lb (110.2 kg)  06/02/22 242 lb 12.8 oz (110.1 kg)  03/21/22 247 lb (112 kg)    Physical Exam Vitals reviewed.  HENT:     Right Ear: Tympanic membrane and external ear normal. Decreased hearing noted. There is no impacted cerumen.     Left Ear: Decreased hearing noted. There is impacted cerumen.  Ears:     Comments: I put Colace in the left EAC and irrigated it.  The cerumen was removed.  The examination afterwards is normal.  He tolerated this well.    Nose: Nose normal.     Mouth/Throat:     Mouth: Mucous membranes are moist.  Eyes:     General: No scleral icterus.    Conjunctiva/sclera: Conjunctivae normal.  Cardiovascular:     Rate and Rhythm: Normal rate and regular rhythm.     Heart sounds: No murmur heard. Pulmonary:     Effort: Pulmonary effort is normal.     Breath sounds: No stridor. No wheezing, rhonchi or rales.  Abdominal:     General: Abdomen is flat.     Palpations: There is no mass.      Tenderness: There is no abdominal tenderness. There is no guarding.     Hernia: No hernia is present.  Musculoskeletal:        General: Normal range of motion.     Cervical back: Neck supple.     Right lower leg: No edema.     Left lower leg: No edema.  Lymphadenopathy:     Cervical: No cervical adenopathy.  Skin:    General: Skin is warm and dry.     Coloration: Skin is not pale.  Neurological:     General: No focal deficit present.     Mental Status: Mental status is at baseline.  Psychiatric:        Mood and Affect: Mood normal.        Behavior: Behavior normal.     Lab Results  Component Value Date   WBC 9.9 07/27/2022   HGB 17.3 (H) 07/27/2022   HCT 49.9 07/27/2022   PLT 236.0 07/27/2022   GLUCOSE 133 (H) 07/27/2022   CHOL 124 06/05/2022   TRIG 159 (H) 06/05/2022   HDL 38 (L) 06/05/2022   LDLDIRECT 81.0 01/05/2022   LDLCALC 59 06/05/2022   ALT 18 06/05/2022   AST 14 06/05/2022   NA 137 07/27/2022   K 4.0 07/27/2022   CL 99 07/27/2022   CREATININE 1.11 07/27/2022   BUN 19 07/27/2022   CO2 27 07/27/2022   TSH 1.15 01/05/2022   PSA 2.86 12/03/2017   HGBA1C 7.7 (H) 07/27/2022   MICROALBUR 7.9 (H) 01/05/2022    VAS US CAROTID  Result Date: 04/05/2022 Carotid Arterial Duplex Study Patient Name:  TRAVONTE DUGGAL  Date of Exam:   04/04/2022 Medical Rec #: [id number]             Accession #:    [id number] Date of Birth: 01-03-1936            Patient Gender: M Patient Age:   86 years Exam Location:  [facility] Procedure:      VAS US CAROTID Referring Phys: Mayra Reel --------------------------------------------------------------------------------  Indications:       Remote history of TIA with mild carotid artery stenosis                    presents for follow-up evaluation. Patient denies any                    cerebrovascular symptoms at this time. Risk Factors:      Hypertension, hyperlipidemia, Diabetes, past history of                    smoking. Comparison  Study:  Previous carotid duplex performed 06/11/2012 showed  RICA                    velocities of AB-123456789 cm/sec and LICA velocities of AB-123456789                    cm/sec. Study was listed as technically challenging. Performing Technologist: Mariane Masters RVT  Examination Guidelines: A complete evaluation includes B-mode imaging, spectral Doppler, color Doppler, and power Doppler as needed of all accessible portions of each vessel. Bilateral testing is considered an integral part of a complete examination. Limited examinations for reoccurring indications may be performed as noted.  Right Carotid Findings: +----------+--------+--------+--------+------------------+--------+           PSV cm/sEDV cm/sStenosisPlaque DescriptionComments +----------+--------+--------+--------+------------------+--------+ CCA Prox  151     15                                         +----------+--------+--------+--------+------------------+--------+ CCA Distal83      14                                         +----------+--------+--------+--------+------------------+--------+ ICA Prox  95      12              heterogenous               +----------+--------+--------+--------+------------------+--------+ ICA Mid   120     28      1-39%   heterogenous               +----------+--------+--------+--------+------------------+--------+ ICA Distal124     29                                         +----------+--------+--------+--------+------------------+--------+ ECA       103     16                                         +----------+--------+--------+--------+------------------+--------+ +----------+--------+-------+----------------+-------------------+           PSV cm/sEDV cmsDescribe        Arm Pressure (mmHG) +----------+--------+-------+----------------+-------------------+ Subclavian142     0      Multiphasic, [id number]                  +----------+--------+-------+----------------+-------------------+ +---------+--------+--------+--------------+ VertebralPSV cm/sEDV cm/sNot identified +---------+--------+--------+--------------+  Left Carotid Findings: +----------+--------+--------+--------+--------------------+--------+           PSV cm/sEDV cm/sStenosisPlaque Description  Comments +----------+--------+--------+--------+--------------------+--------+ CCA Prox  105     18                                           +----------+--------+--------+--------+--------------------+--------+ CCA Distal68      10                                           +----------+--------+--------+--------+--------------------+--------+ ICA Prox  83      14  1-39%   diffuse and calcific         +----------+--------+--------+--------+--------------------+--------+ ICA Mid   67      14                                           +----------+--------+--------+--------+--------------------+--------+ ICA Distal71      22                                           +----------+--------+--------+--------+--------------------+--------+ ECA       122     6                                            +----------+--------+--------+--------+--------------------+--------+ +----------+--------+--------+----------------+-------------------+           PSV cm/sEDV cm/sDescribe        Arm Pressure (mmHG) +----------+--------+--------+----------------+-------------------+ Subclavian132     0       Multiphasic, [id number]                 +----------+--------+--------+----------------+-------------------+ +---------+--------+--+--------+--+---------+ VertebralPSV cm/s40EDV cm/s17Antegrade +---------+--------+--+--------+--+---------+   Summary: Right Carotid: Velocities in the right ICA are consistent with a 1-39% stenosis. Left Carotid: Velocities in the left ICA are consistent with a 1-39% stenosis. Vertebrals:  Left  vertebral artery demonstrates antegrade flow. Right vertebral              artery demonstrates no discernable flow. Subclavians: Normal flow hemodynamics were seen in bilateral subclavian              arteries. *See table(s) above for measurements and observations.  Electronically signed by Carlyle Dolly MD on 04/05/2022 at 2:17:12 PM.    Final     Assessment & Plan:  Type 2 diabetes mellitus with diabetic nephropathy, without long-term current use of insulin ([place])- His blood sugar is adequately well-controlled. -     Basic metabolic panel; Future -     Hemoglobin A1c; Future -     Ambulatory referral to Ophthalmology  Essential hypertension- His blood pressure is well-controlled. -     Basic metabolic panel; Future -     CBC with Differential/Platelet; Future  Chronic painful diabetic neuropathy (HCC) -     Pregabalin; Take 1 capsule (25 mg total) by mouth 2 (two) times daily.  Dispense: 270 capsule; Refill: 0  Balanitis -     Nystatin; Apply 1 Application topically 2 (two) times daily.  Dispense: 30 g; Refill: 2  Hearing loss due to cerumen impaction, left- The cerumen has been removed.     Follow-up: Return in about 4 months (around 11/26/2022).  Scarlette Calico, MD

## 2022-07-27 NOTE — Patient Instructions (Signed)

## 2022-07-27 NOTE — Telephone Encounter (Signed)
approval authorizes your coverage from 05/15/2022 - 05/15/2023

## 2022-07-28 DIAGNOSIS — N481 Balanitis: Secondary | ICD-10-CM | POA: Insufficient documentation

## 2022-08-21 NOTE — Telephone Encounter (Signed)
Received notification from [employer] regarding approval for RYBELSUS. Patient assistance approved from 08/16/2022 to 05/15/2023.  Phone: [phone] Georga B[facility] Patient Advocate [phone]) [phone]  LETTER OF APPROVAL IS IN MEDIA OF CHART

## 2022-08-25 ENCOUNTER — Telehealth: Payer: Self-pay

## 2022-08-25 NOTE — Telephone Encounter (Signed)
Shipment received to office,patient notified for pick up

## 2022-10-18 ENCOUNTER — Telehealth: Payer: Self-pay

## 2022-10-18 NOTE — Telephone Encounter (Signed)
RECEIVED Reorder refill request for  RYBELSUS 14MG   from [employer] I WILL FAX TO provider review, fil out and fax to [phone] ATTENTION TO Jovita Persing Romelle Starcher CPhT Rx Patient Advocate [phone]    PLEASE BE ADVISED

## 2022-11-03 ENCOUNTER — Other Ambulatory Visit: Payer: Self-pay

## 2022-11-03 ENCOUNTER — Other Ambulatory Visit: Payer: Self-pay | Admitting: Internal Medicine

## 2022-11-03 DIAGNOSIS — E1121 Type 2 diabetes mellitus with diabetic nephropathy: Secondary | ICD-10-CM

## 2022-11-22 NOTE — Telephone Encounter (Deleted)
Submitted application for {PAPMEDS:25187} to {PAPCOMPANY:25188} for patient assistance.   Phone: ***

## 2022-11-22 NOTE — Telephone Encounter (Signed)
RECEIVED PROVIDER REORDER-REFILL PAGES AND Submitted application for RYBELSUS to [employer] for patient assistance PROCESSING.    Phone: [phone]  PLEASE BE ADVISED

## 2022-11-30 ENCOUNTER — Ambulatory Visit: Payer: Medicare HMO

## 2022-11-30 VITALS — Ht 70.0 in | Wt 243.0 lb

## 2022-11-30 DIAGNOSIS — Z0000 Encounter for general adult medical examination without abnormal findings: Secondary | ICD-10-CM | POA: Diagnosis not present

## 2022-11-30 NOTE — Progress Notes (Signed)
Subjective:   Matthew Villanueva is a 87 y.o. male who presents for Medicare Annual/Subsequent preventive examination.  Visit Complete: Virtual  I connected with  Matthew Villanueva on 11/30/22 by a audio enabled telemedicine application and verified that I am speaking with the correct person using two identifiers.  Patient Location: Home  Provider Location: Office/Clinic  I discussed the limitations of evaluation and management by telemedicine. The patient expressed understanding and agreed to proceed.  Review of Systems    Cardiac Risk Factors include: advanced age (>52men, >39 women);male gender;diabetes mellitus;dyslipidemia;hypertension;obesity (BMI >30kg/m2)     Objective:    Today's Vitals   11/30/22 1016  Weight: 243 lb (110.2 kg)  Height: 5\' 10"  (1.778 m)   Body mass index is 34.87 kg/m.     11/30/2022   10:23 AM 12/15/2020    4:31 PM 12/04/2017   10:26 AM 11/29/2016   10:51 AM 03/06/2016   12:39 PM 11/11/2015    8:08 AM 11/09/2015    1:52 PM  Advanced Directives  Does Patient Have a Medical Advance Directive? Yes Yes Yes No;Yes No Yes Yes  Type of Estate agent of [place];Living will Living will;Healthcare Power of [employer] Power of [place];Living will   Healthcare Power of [place];Living will Healthcare Power of [place];Living will  Does patient want to make changes to medical advance directive?  No - Patient declined    No - Patient declined No - Patient declined  Copy of Healthcare Power of Attorney in Chart? No - copy requested No - copy requested No - copy requested   No - copy requested No - copy requested  Would patient like information on creating a medical advance directive?    Yes (ED - Information included in AVS)       Current Medications (verified) Outpatient Encounter Medications as of 11/30/2022  Medication Sig   blood glucose meter kit and supplies KIT Use to test blood sugar up to twice daily. DX: E11.21    celecoxib (CELEBREX) 100 MG capsule TAKE ONE CAPSULE BY MOUTH TWICE A DAY   colchicine 0.6 MG tablet Take 1 tablet (0.6 mg total) by mouth daily.   glucose blood (COOL BLOOD GLUCOSE TEST STRIPS) test strip Use to test blood sugar up to twice daily. DX: E11.21   JARDIANCE 25 MG TABS tablet TAKE 1 TABLET BY MOUTH DAILY   Lancets (ONETOUCH ULTRASOFT) lancets Use to test blood sugar up to twice daily. DX: E11.21   losartan-hydrochlorothiazide (HYZAAR) 100-25 MG tablet TAKE 1 TABLET BY MOUTH DAILY   Multiple Vitamin (MULTIVITAMIN) tablet Take 1 tablet by mouth daily.   nystatin cream (MYCOSTATIN) Apply 1 Application topically 2 (two) times daily.   ONETOUCH VERIO test strip USE TO TEST BLOOD SUGAR TWO TIMES A DAY   pregabalin (LYRICA) 25 MG capsule Take 1 capsule (25 mg total) by mouth 2 (two) times daily.   Semaglutide (RYBELSUS) 14 MG TABS 1 tab by mouth once daily   No facility-administered encounter medications on file as of 11/30/2022.    Allergies (verified) Livalo [pitavastatin], Crestor [rosuvastatin], Metformin and related, Zetia [ezetimibe], and Pravastatin   History: Past Medical History:  Diagnosis Date   Arthritis    Diabetes mellitus    NO INSULIN   Dyspnea    Gout    History of colonic polyps    HTN (hypertension)    Localized osteoarthrosis not specified whether primary or secondary, hand    OSA (obstructive sleep apnea)  Other specified cardiac dysrhythmias(427.89) 2004   ABLATION DUE TO TACHYCARDIA   Right bundle branch block    Past Surgical History:  Procedure Laterality Date   CARDIAC ELECTROPHYSIOLOGY STUDY AND ABLATION  2004   ablation for SVT with a chronic right bundle branch block    HEMORRHOID SURGERY  1960   PILONIDAL CYST DRAINAGE     Family History  Problem Relation Age of Onset   Cancer Father        throat and tongue   Heart disease Mother    Lung cancer Sister    Diabetes Maternal Uncle    Heart attack Maternal Grandmother    Heart disease  Maternal Grandmother        CAD/MI   Heart disease Maternal Grandfather    Heart disease Maternal Aunt        CAD   Social History   Socioeconomic History   Marital status: Single    Spouse name: Not on file   Number of children: Not on file   Years of education: 16   Highest education level: Not on file  Occupational History   Occupation: self employed, Manufacturing systems engineer: SELF-EMPLOYED  Tobacco Use   Smoking status: Former    Current packs/day: 0.00    Average packs/day: 2.0 packs/day for 30.0 years (60.0 ttl pk-yrs)    Types: Cigarettes    Start date: 05/15/1954    Quit date: 05/15/1984    Years since quitting: 38.5   Smokeless tobacco: Never  Vaping Use   Vaping status: Never Used  Substance and Sexual Activity   Alcohol use: Yes    Comment: Rarely   Drug use: No   Sexual activity: Not Currently  Other Topics Concern   Not on file  Social History Narrative   HS[place], [place]   Single   Work: self employed...Nurse, children's   I-ADLs   End-of-Life: Yes-CPR, Yes-short-term Mechanical Ventilation, but no prolonged ventilation. Is willing to undergo dialysis, prolonged tub feeding.   Pt states former smoker. 1986. 2ppd. Started age 41   Social Determinants of Health   Financial Resource Strain: Low Risk  (11/30/2022)   Overall Financial Resource Strain (CARDIA)    Difficulty of Paying Living Expenses: Not very hard  Food Insecurity: No Food Insecurity (11/30/2022)   Hunger Vital Sign    Worried About Running Out of Food in the Last Year: Never true    Ran Out of Food in the Last Year: Never true  Transportation Needs: No Transportation Needs (11/30/2022)   PRAPARE - Administrator, Civil Service (Medical): No    Lack of Transportation (Non-Medical): No  Physical Activity: Unknown (11/30/2022)   Exercise Vital Sign    Days of Exercise per Week: 0 days    Minutes of Exercise per Session: Not on file  Stress: No Stress Concern Present (11/30/2022)    [employer] of Occupational Health - Occupational Stress Questionnaire    Feeling of Stress : Not at all  Social Connections: Unknown (11/30/2022)   Social Connection and Isolation Panel [NHANES]    Frequency of Communication with Friends and Family: Once a week    Frequency of Social Gatherings with Friends and Family: More than three times a week    Attends Religious Services: Never    Database administrator or Organizations: No    Attends Banker Meetings: Never    Marital Status: Patient declined    Tobacco Counseling Counseling given:  Not Answered   Clinical Intake:  Pre-visit preparation completed: Yes  Pain : No/denies pain     BMI - recorded: 34.87 Nutritional Status: BMI > 30  Obese Nutritional Risks: None Diabetes: Yes CBG done?: No (131 fasting-per patient) Did pt. bring in CBG monitor from home?: No     Interpreter Needed?: No  Information entered by :: Reeder Brisby, RMA   Activities of Daily Living    11/30/2022   10:20 AM  In your present state of health, do you have any difficulty performing the following activities:  Hearing? 1  Vision? 0  Difficulty concentrating or making decisions? 0  Walking or climbing stairs? 1  Comment per patient some  Dressing or bathing? 0  Doing errands, shopping? 0  Preparing Food and eating ? N  Using the Toilet? N  In the past six months, have you accidently leaked urine? N  Do you have problems with loss of bowel control? N  Managing your Medications? N  Managing your Finances? N  Housekeeping or managing your Housekeeping? N    Patient Care Team: Etta Grandchild, MD as PCP - General (Internal Medicine) Wendall Stade, MD as PCP - Cardiology (Cardiology) Coralyn Helling, MD (Pulmonary Disease) Kathyrn Sheriff, [facility] (Inactive) as Pharmacist (Pharmacist)  Indicate any recent Medical Services you may have received from other than [facility] providers in the past year (date may be  approximate).     Assessment:   This is a routine wellness examination for [place].  Hearing/Vision screen Hearing Screening - Comments:: Wears hearing aides  Dietary issues and exercise activities discussed:     Goals Addressed             This Visit's Progress    Matintain My Quality of Life        Depression Screen    11/30/2022   10:27 AM 06/02/2022    1:39 PM 01/05/2022    9:28 AM 01/05/2022    9:11 AM 11/03/2021    2:31 PM 12/15/2020    4:28 PM 08/16/2020    1:15 PM  PHQ 2/9 Scores  PHQ - 2 Score 0 0 0 0 0 0 0  PHQ- 9 Score 1   2       Fall Risk    11/30/2022   10:23 AM 06/02/2022    1:39 PM 01/05/2022    9:11 AM 11/03/2021    2:31 PM 12/15/2020    4:33 PM  Fall Risk   Falls in the past year? 0 0 1 0 1  Number falls in past yr: 0 0 1  0  Injury with Fall? 0 0 0  1  Risk for fall due to : No Fall Risks No Fall Risks Other (Comment)    Follow up Falls prevention discussed Falls evaluation completed Falls evaluation completed      MEDICARE RISK AT HOME:  Medicare Risk at Home - 11/30/22 1024     Any stairs in or around the home? No    Home free of loose throw rugs in walkways, pet beds, electrical cords, etc? Yes    Adequate lighting in your home to reduce risk of falls? Yes    Life alert? Yes    Use of a cane, walker or w/c? No    Grab bars in the bathroom? Yes    Shower chair or bench in shower? Yes    Elevated toilet seat or a handicapped toilet? No  TIMED UP AND GO:  Was the test performed?  No    Cognitive Function:    12/04/2017   10:29 AM  MMSE - Mini Mental State Exam  Orientation to time 5  Orientation to Place 5  Registration 3  Attention/ Calculation 5  Recall 3  Language- name 2 objects 2  Language- repeat 1  Language- follow 3 step command 3  Language- read & follow direction 1  Write a sentence 1  Copy design 1  Total score 30        Immunizations Immunization History  Administered Date(s) Administered   Fluad  Quad(high Dose 65+) 02/17/2019, 02/01/2021   H1N1 04/21/2008   Influenza Whole 04/01/2007, 02/21/2010, 02/13/2011, 03/18/2012   Influenza, High Dose Seasonal PF 03/06/2016, 01/10/2017, 02/01/2018   Influenza,inj,Quad PF,6+ Mos 01/20/2013, 05/04/2015, 01/13/2020   PFIZER(Purple Top)SARS-COV-2 Vaccination 07/06/2019, 08/03/2019, 02/13/2020, 11/11/2020   Pneumococcal Conjugate-13 08/12/2013   Pneumococcal Polysaccharide-23 03/07/2004, 05/17/2017   Td 07/04/2004, 08/16/2020   Tdap 05/04/2015   Zoster Recombinant(Shingrix) 03/08/2019   Zoster, Live 04/29/2010    TDAP status: Up to date  Flu Vaccine status: Up to date  Pneumococcal vaccine status: Up to date  Covid-19 vaccine status: Completed vaccines  Qualifies for Shingles Vaccine? Yes   Zostavax completed Yes   Shingrix Completed?: No.    Education has been provided regarding the importance of this vaccine. Patient has been advised to call insurance company to determine out of pocket expense if they have not yet received this vaccine. Advised may also receive vaccine at local pharmacy or Health Dept. Verbalized acceptance and understanding.  Screening Tests Health Maintenance  Topic Date Due   Zoster Vaccines- Shingrix (2 of 2) 05/03/2019   COVID-19 Vaccine (5 - 2023-24 season) 01/13/2022   OPHTHALMOLOGY EXAM  03/30/2022   INFLUENZA VACCINE  12/14/2022   Diabetic kidney evaluation - Urine ACR  01/06/2023   FOOT EXAM  01/06/2023   HEMOGLOBIN A1C  01/27/2023   Diabetic kidney evaluation - eGFR measurement  07/27/2023   Medicare Annual Wellness (AWV)  11/30/2023   DTaP/Tdap/Td (4 - Td or Tdap) 08/17/2030   Pneumonia Vaccine 69+ Years old  Completed   HPV VACCINES  Aged Out    Health Maintenance  Health Maintenance Due  Topic Date Due   Zoster Vaccines- Shingrix (2 of 2) 05/03/2019   COVID-19 Vaccine (5 - 2023-24 season) 01/13/2022   OPHTHALMOLOGY EXAM  03/30/2022    Colorectal cancer screening: No longer required.    Lung Cancer Screening: (Low Dose CT Chest recommended if Age 81-80 years, 20 pack-year currently smoking OR have quit w/in 15years.) does not qualify.   Lung Cancer Screening Referral: N/a  Additional Screening:  Hepatitis C Screening: does not qualify;  Vision Screening: Recommended annual ophthalmology exams for early detection of glaucoma and other disorders of the eye. Is the patient up to date with their annual eye exam?  No  Who is the provider or what is the name of the office in which the patient attends annual eye exams? Patient has an up coming appointment in August 2024.  He does not know the name of eye practice. If pt is not established with a provider, would they like to be referred to a provider to establish care? No .   Dental Screening: Recommended annual dental exams for proper oral hygiene  Diabetic Foot Exam: Diabetic Foot Exam: Completed 01/05/2022  Community Resource Referral / Chronic Care Management: CRR required this visit?  No   CCM required  this visit?  No     Plan:     I have personally reviewed and noted the following in the patient's chart:   Medical and social history Use of alcohol, tobacco or illicit drugs  Current medications and supplements including opioid prescriptions. Patient is not currently taking opioid prescriptions. Functional ability and status Nutritional status Physical activity Advanced directives List of other physicians Hospitalizations, surgeries, and ER visits in previous 12 months Vitals Screenings to include cognitive, depression, and falls Referrals and appointments  In addition, I have reviewed and discussed with patient certain preventive protocols, quality metrics, and best practice recommendations. A written personalized care plan for preventive services as well as general preventive health recommendations were provided to patient.     Westin Knotts L Jatasia Gundrum, CMA   11/30/2022   After Visit Summary: (MyChart) Due to  this being a telephonic visit, the after visit summary with patients personalized plan was offered to patient via MyChart   Nurse Notes: Patient is due for 2nd shingrix Vaccine.  Patient aware that he can get it at his pharmacy or during his next office visit here. He has an up coming eye exam in August but does not know the name of provider or eye care center.  Patient declined to schedule with me for next year's AWVS, he will call office to schedule.

## 2022-11-30 NOTE — Patient Instructions (Signed)
Matthew Villanueva , Thank you for taking time to come for your Medicare Wellness Visit. I appreciate your ongoing commitment to your health goals. Please review the following plan we discussed and let me know if I can assist you in the future.   These are the goals we discussed:  Goals      Matintain My Quality of Life        This is a list of the screening recommended for you and due dates:  Health Maintenance  Topic Date Due   Zoster (Shingles) Vaccine (2 of 2) 05/03/2019   COVID-19 Vaccine (5 - 2023-24 season) 01/13/2022   Eye exam for diabetics  03/30/2022   Flu Shot  12/14/2022   Yearly kidney health urinalysis for diabetes  01/06/2023   Complete foot exam   01/06/2023   Hemoglobin A1C  01/27/2023   Yearly kidney function blood test for diabetes  07/27/2023   Medicare Annual Wellness Visit  11/30/2023   DTaP/Tdap/Td vaccine (4 - Td or Tdap) 08/17/2030   Pneumonia Vaccine  Completed   HPV Vaccine  Aged Out    Advanced directives: Please bring a copy of your health care power of attorney and living will to the office to be added to your chart at your convenience.   Conditions/risks identified: Thank you for attending this appointment today.  Please remember to have your 2nd Shingrix vaccine done, at your pharmacy of during your next office visit here.  Aim for 30 minutes of exercise or brisk walking, 6-8 glasses of water, and 5 servings of fruits and vegetables each day.   Next appointment: Follow up in one year for your annual wellness visit.   Preventive Care 85 Years and Older, Male  Preventive care refers to lifestyle choices and visits with your health care provider that can promote health and wellness. What does preventive care include? A yearly physical exam. This is also called an annual well check. Dental exams once or twice a year. Routine eye exams. Ask your health care provider how often you should have your eyes checked. Personal lifestyle choices, including: Daily  care of your teeth and gums. Regular physical activity. Eating a healthy diet. Avoiding tobacco and drug use. Limiting alcohol use. Practicing safe sex. Taking low doses of aspirin every day. Taking vitamin and mineral supplements as recommended by your health care provider. What happens during an annual well check? The services and screenings done by your health care provider during your annual well check will depend on your age, overall health, lifestyle risk factors, and family history of disease. Counseling  Your health care provider may ask you questions about your: Alcohol use. Tobacco use. Drug use. Emotional well-being. Home and relationship well-being. Sexual activity. Eating habits. History of falls. Memory and ability to understand (cognition). Work and work Astronomer. Screening  You may have the following tests or measurements: Height, weight, and BMI. Blood pressure. Lipid and cholesterol levels. These may be checked every 5 years, or more frequently if you are over 77 years old. Skin check. Lung cancer screening. You may have this screening every year starting at age 83 if you have a 30-pack-year history of smoking and currently smoke or have quit within the past 15 years. Fecal occult blood test (FOBT) of the stool. You may have this test every year starting at age 39. Flexible sigmoidoscopy or colonoscopy. You may have a sigmoidoscopy every 5 years or a colonoscopy every 10 years starting at age 69. Prostate cancer screening. Recommendations  will vary depending on your family history and other risks. Hepatitis C blood test. Hepatitis B blood test. Sexually transmitted disease (STD) testing. Diabetes screening. This is done by checking your blood sugar (glucose) after you have not eaten for a while (fasting). You may have this done every 1-3 years. Abdominal aortic aneurysm (AAA) screening. You may need this if you are a current or former smoker. Osteoporosis.  You may be screened starting at age 62 if you are at high risk. Talk with your health care provider about your test results, treatment options, and if necessary, the need for more tests. Vaccines  Your health care provider may recommend certain vaccines, such as: Influenza vaccine. This is recommended every year. Tetanus, diphtheria, and acellular pertussis (Tdap, Td) vaccine. You may need a Td booster every 10 years. Zoster vaccine. You may need this after age 74. Pneumococcal 13-valent conjugate (PCV13) vaccine. One dose is recommended after age 63. Pneumococcal polysaccharide (PPSV23) vaccine. One dose is recommended after age 46. Talk to your health care provider about which screenings and vaccines you need and how often you need them. This information is not intended to replace advice given to you by your health care provider. Make sure you discuss any questions you have with your health care provider. Document Released: 05/28/2015 Document Revised: 01/19/2016 Document Reviewed: 03/02/2015 Elsevier Interactive Patient Education  2017 [employer].  Fall Prevention in the Home Falls can cause injuries. They can happen to people of all ages. There are many things you can do to make your home safe and to help prevent falls. What can I do on the outside of my home? Regularly fix the edges of walkways and driveways and fix any cracks. Remove anything that might make you trip as you walk through a door, such as a raised step or threshold. Trim any bushes or trees on the path to your home. Use bright outdoor lighting. Clear any walking paths of anything that might make someone trip, such as rocks or tools. Regularly check to see if handrails are loose or broken. Make sure that both sides of any steps have handrails. Any raised decks and porches should have guardrails on the edges. Have any leaves, snow, or ice cleared regularly. Use sand or salt on walking paths during winter. Clean up any  spills in your garage right away. This includes oil or grease spills. What can I do in the bathroom? Use night lights. Install grab bars by the toilet and in the tub and shower. Do not use towel bars as grab bars. Use non-skid mats or decals in the tub or shower. If you need to sit down in the shower, use a plastic, non-slip stool. Keep the floor dry. Clean up any water that spills on the floor as soon as it happens. Remove soap buildup in the tub or shower regularly. Attach bath mats securely with double-sided non-slip rug tape. Do not have throw rugs and other things on the floor that can make you trip. What can I do in the bedroom? Use night lights. Make sure that you have a light by your bed that is easy to reach. Do not use any sheets or blankets that are too big for your bed. They should not hang down onto the floor. Have a firm chair that has side arms. You can use this for support while you get dressed. Do not have throw rugs and other things on the floor that can make you trip. What can I do  in the kitchen? Clean up any spills right away. Avoid walking on wet floors. Keep items that you use a lot in easy-to-reach places. If you need to reach something above you, use a strong step stool that has a grab bar. Keep electrical cords out of the way. Do not use floor polish or wax that makes floors slippery. If you must use wax, use non-skid floor wax. Do not have throw rugs and other things on the floor that can make you trip. What can I do with my stairs? Do not leave any items on the stairs. Make sure that there are handrails on both sides of the stairs and use them. Fix handrails that are broken or loose. Make sure that handrails are as long as the stairways. Check any carpeting to make sure that it is firmly attached to the stairs. Fix any carpet that is loose or worn. Avoid having throw rugs at the top or bottom of the stairs. If you do have throw rugs, attach them to the floor  with carpet tape. Make sure that you have a light switch at the top of the stairs and the bottom of the stairs. If you do not have them, ask someone to add them for you. What else can I do to help prevent falls? Wear shoes that: Do not have high heels. Have rubber bottoms. Are comfortable and fit you well. Are closed at the toe. Do not wear sandals. If you use a stepladder: Make sure that it is fully opened. Do not climb a closed stepladder. Make sure that both sides of the stepladder are locked into place. Ask someone to hold it for you, if possible. Clearly mark and make sure that you can see: Any grab bars or handrails. First and last steps. Where the edge of each step is. Use tools that help you move around (mobility aids) if they are needed. These include: Canes. Walkers. Scooters. Crutches. Turn on the lights when you go into a dark area. Replace any light bulbs as soon as they burn out. Set up your furniture so you have a clear path. Avoid moving your furniture around. If any of your floors are uneven, fix them. If there are any pets around you, be aware of where they are. Review your medicines with your doctor. Some medicines can make you feel dizzy. This can increase your chance of falling. Ask your doctor what other things that you can do to help prevent falls. This information is not intended to replace advice given to you by your health care provider. Make sure you discuss any questions you have with your health care provider. Document Released: 02/25/2009 Document Revised: 10/07/2015 Document Reviewed: 06/05/2014 Elsevier Interactive Patient Education  2017 [employer].

## 2022-12-01 ENCOUNTER — Telehealth: Payer: Self-pay

## 2022-12-01 ENCOUNTER — Telehealth: Payer: Self-pay | Admitting: Internal Medicine

## 2022-12-01 NOTE — Telephone Encounter (Signed)
Track [id number] Rybelsus 14 mG has

## 2022-12-01 NOTE — Telephone Encounter (Signed)
Pt called to see if his meds was here and it is. He will be here to pick up on Monday 7.22.24  Medication company called as well for confirmation that we received it with Tracking number:  [id number] (Rybelsus 14 mG).

## 2022-12-01 NOTE — Telephone Encounter (Signed)
Pt assistance was placed up front for pick up.

## 2022-12-05 NOTE — Telephone Encounter (Signed)
Received notification from [employer] regarding approval for RYBELSUS 14MG . Patient assistance approved from 11/24/2022 to 05/15/2023.  Phone: [phone] PLEASE BE ADVISE PT CAN CALL NUMBER ABOVE TO CHECK STATUS OF SHIPPING     Melanee Spry CPhT [facility] [phone]

## 2023-01-18 DIAGNOSIS — G43109 Migraine with aura, not intractable, without status migrainosus: Secondary | ICD-10-CM | POA: Diagnosis not present

## 2023-01-18 DIAGNOSIS — H25813 Combined forms of age-related cataract, bilateral: Secondary | ICD-10-CM | POA: Diagnosis not present

## 2023-01-18 DIAGNOSIS — E119 Type 2 diabetes mellitus without complications: Secondary | ICD-10-CM | POA: Diagnosis not present

## 2023-01-18 LAB — HM DIABETES EYE EXAM

## 2023-01-25 NOTE — Telephone Encounter (Signed)
PAP: Application for Rybelsus has been submitted to PAP Companies: [employer], via fax   PLEASE BE ADVISED REFILL RE-ORDER

## 2023-02-05 ENCOUNTER — Telehealth: Payer: Self-pay | Admitting: Internal Medicine

## 2023-02-05 ENCOUNTER — Other Ambulatory Visit: Payer: Self-pay | Admitting: Internal Medicine

## 2023-02-05 DIAGNOSIS — E1121 Type 2 diabetes mellitus with diabetic nephropathy: Secondary | ICD-10-CM

## 2023-02-05 DIAGNOSIS — I10 Essential (primary) hypertension: Secondary | ICD-10-CM

## 2023-02-05 MED ORDER — LOSARTAN POTASSIUM-HCTZ 100-25 MG PO TABS: 1.0000 | ORAL_TABLET | Freq: Every day | ORAL | 0 refills | Status: DC

## 2023-02-05 NOTE — Telephone Encounter (Signed)
Patient has OV scheduled for 03/14/23.   Prescription Request  02/05/2023  LOV: 07/27/2022  What is the name of the medication or equipment? losartan-hydrochlorothiazide (HYZAAR) 100-25 MG tablet   Have you contacted your pharmacy to request a refill? Yes   Which pharmacy would you like this sent to?  [facility] [id number] [place], [place] - [address] [place] [address] Phone: [phone] Fax: [phone]    Patient notified that their request is being sent to the clinical staff for review and that they should receive a response within 2 business days.   Please advise at Mobile [phone] (mobile)

## 2023-02-12 ENCOUNTER — Encounter: Payer: Self-pay | Admitting: Family Medicine

## 2023-02-12 ENCOUNTER — Ambulatory Visit (INDEPENDENT_AMBULATORY_CARE_PROVIDER_SITE_OTHER): Payer: Medicare HMO | Admitting: Family Medicine

## 2023-02-12 ENCOUNTER — Telehealth: Payer: Self-pay | Admitting: Internal Medicine

## 2023-02-12 VITALS — BP 122/80 | HR 62 | Temp 98.3°F | Resp 20 | Ht 70.0 in | Wt 242.0 lb

## 2023-02-12 DIAGNOSIS — H6123 Impacted cerumen, bilateral: Secondary | ICD-10-CM | POA: Diagnosis not present

## 2023-02-12 MED ORDER — ONETOUCH ULTRASOFT LANCETS MISC: 12 refills | Status: AC

## 2023-02-12 NOTE — Progress Notes (Signed)
Assessment & Plan:  1. Bilateral impacted cerumen Education provided on earwax buildup.   Follow up plan: Return if symptoms worsen or fail to improve.  Deliah Boston, MSN, APRN, FNP-C  Subjective:  HPI: Matthew Villanueva is a 87 y.o. male presenting on 02/12/2023 for Ear Fullness (Possible wax (right ear is worse than left) )  Patient is here due to ear fullness and decreased hearing.  He states this occurs when he needs his ears washed out.  He tried to flush his ears at home without success.   ROS: Negative unless specifically indicated above in HPI.   Relevant past medical history reviewed and updated as indicated.   Allergies and medications reviewed and updated.   Current Outpatient Medications:    blood glucose meter kit and supplies KIT, Use to test blood sugar up to twice daily. DX: E11.21, Disp: 1 each, Rfl: 0   celecoxib (CELEBREX) 100 MG capsule, TAKE ONE CAPSULE BY MOUTH TWICE A DAY, Disp: 180 capsule, Rfl: 0   colchicine 0.6 MG tablet, Take 1 tablet (0.6 mg total) by mouth daily., Disp: 90 tablet, Rfl: 0   glucose blood (COOL BLOOD GLUCOSE TEST STRIPS) test strip, Use to test blood sugar up to twice daily. DX: E11.21, Disp: 100 each, Rfl: 12   JARDIANCE 25 MG TABS tablet, TAKE 1 TABLET BY MOUTH DAILY, Disp: 30 tablet, Rfl: 3   Lancets (ONETOUCH ULTRASOFT) lancets, Use to test blood sugar up to twice daily. DX: E11.21, Disp: 100 each, Rfl: 12   losartan-hydrochlorothiazide (HYZAAR) 100-25 MG tablet, Take 1 tablet by mouth daily., Disp: 90 tablet, Rfl: 0   Multiple Vitamin (MULTIVITAMIN) tablet, Take 1 tablet by mouth daily., Disp: , Rfl:    nystatin cream (MYCOSTATIN), Apply 1 Application topically 2 (two) times daily., Disp: 30 g, Rfl: 2   ONETOUCH VERIO test strip, USE TO TEST BLOOD SUGAR TWO TIMES A DAY, Disp: 150 strip, Rfl: 5   pregabalin (LYRICA) 25 MG capsule, Take 1 capsule (25 mg total) by mouth 2 (two) times daily., Disp: 270 capsule, Rfl: 0    Semaglutide (RYBELSUS) 14 MG TABS, 1 tab by mouth once daily, Disp: 90 tablet, Rfl: 3  Allergies  Allergen Reactions   Livalo [Pitavastatin] Other (See Comments)    Muscle aches   Crestor [Rosuvastatin]     Muscle aches   Metformin And Related Diarrhea   Zetia [Ezetimibe] Diarrhea   Pravastatin     Muscle aches    Objective:   BP 122/80   Pulse 62   Temp 98.3 F (36.8 C)   Resp 20   Ht 5\' 10"  (1.778 m)   Wt 242 lb (109.8 kg)   BMI 34.72 kg/m    Physical Exam Vitals reviewed.  Constitutional:      General: He is not in acute distress.    Appearance: Normal appearance. He is not ill-appearing, toxic-appearing or diaphoretic.  HENT:     Head: Normocephalic and atraumatic.     Right Ear: Tympanic membrane, ear canal and external ear normal. There is impacted cerumen.     Left Ear: Tympanic membrane, ear canal and external ear normal. There is impacted cerumen.  Eyes:     General: No scleral icterus.       Right eye: No discharge.        Left eye: No discharge.     Conjunctiva/sclera: Conjunctivae normal.  Cardiovascular:     Rate and Rhythm: Normal rate.  Pulmonary:  Effort: Pulmonary effort is normal. No respiratory distress.  Musculoskeletal:        General: Normal range of motion.     Cervical back: Normal range of motion.  Skin:    General: Skin is warm and dry.  Neurological:     Mental Status: He is alert and oriented to person, place, and time. Mental status is at baseline.  Psychiatric:        Mood and Affect: Mood normal.        Behavior: Behavior normal.        Thought Content: Thought content normal.        Judgment: Judgment normal.    Ear Cerumen Removal  Date/Time: 02/12/2023 11:50 AM  Performed by: Gwenlyn Fudge, FNP Authorized by: Gwenlyn Fudge, FNP   Anesthesia: Local Anesthetic: none Location details: right ear and left ear Patient tolerance: patient tolerated the procedure well with no immediate complications Procedure type:  irrigation  Sedation: Patient sedated: no

## 2023-02-12 NOTE — Telephone Encounter (Signed)
Prescription Request  02/12/2023  LOV: 07/27/2022  What is the name of the medication or equipment? Lancets (ONETOUCH ULTRASOFT) lancets   Have you contacted your pharmacy to request a refill? No   Which pharmacy would you like this sent to?  [facility] [id number] [place], [place] - [address] [place] [address] Phone: [phone] Fax: [phone]    Patient notified that their request is being sent to the clinical staff for review and that they should receive a response within 2 business days.   Please advise at Mobile [phone] (mobile)

## 2023-02-27 ENCOUNTER — Telehealth: Payer: Self-pay

## 2023-02-27 NOTE — Telephone Encounter (Signed)
Called and left voicemail, letting Pt know Rybelsus has arrived and placed up front for pickup.

## 2023-03-06 NOTE — Telephone Encounter (Signed)
Patient has picked up Rybelsus.

## 2023-03-08 ENCOUNTER — Other Ambulatory Visit: Payer: Self-pay

## 2023-03-08 ENCOUNTER — Other Ambulatory Visit: Payer: Self-pay | Admitting: Internal Medicine

## 2023-03-08 DIAGNOSIS — E1121 Type 2 diabetes mellitus with diabetic nephropathy: Secondary | ICD-10-CM

## 2023-03-14 ENCOUNTER — Encounter: Payer: Self-pay | Admitting: Internal Medicine

## 2023-03-14 ENCOUNTER — Ambulatory Visit: Payer: Medicare HMO | Admitting: Internal Medicine

## 2023-03-14 VITALS — BP 142/80 | HR 67 | Temp 97.8°F | Resp 16 | Ht 70.0 in | Wt 241.4 lb

## 2023-03-14 DIAGNOSIS — I10 Essential (primary) hypertension: Secondary | ICD-10-CM

## 2023-03-14 DIAGNOSIS — Z789 Other specified health status: Secondary | ICD-10-CM | POA: Diagnosis not present

## 2023-03-14 DIAGNOSIS — E1121 Type 2 diabetes mellitus with diabetic nephropathy: Secondary | ICD-10-CM | POA: Diagnosis not present

## 2023-03-14 DIAGNOSIS — Z23 Encounter for immunization: Secondary | ICD-10-CM | POA: Insufficient documentation

## 2023-03-14 DIAGNOSIS — Z0001 Encounter for general adult medical examination with abnormal findings: Secondary | ICD-10-CM

## 2023-03-14 DIAGNOSIS — M1A09X0 Idiopathic chronic gout, multiple sites, without tophus (tophi): Secondary | ICD-10-CM

## 2023-03-14 LAB — BASIC METABOLIC PANEL
BUN: 21 mg/dL (ref 6–23)
CO2: 28 meq/L (ref 19–32)
Calcium: 9.5 mg/dL (ref 8.4–10.5)
Chloride: 99 meq/L (ref 96–112)
Creatinine, Ser: 1.08 mg/dL (ref 0.40–1.50)
GFR: 62.78 mL/min (ref >60.00)
Glucose, Bld: 160 mg/dL — ABNORMAL HIGH (ref 70–99)
Potassium: 4.1 meq/L (ref 3.5–5.1)
Sodium: 135 meq/L (ref 135–145)

## 2023-03-14 LAB — URINALYSIS, ROUTINE W REFLEX MICROSCOPIC
Bilirubin Urine: NEGATIVE
Hgb urine dipstick: NEGATIVE
Ketones, ur: NEGATIVE
Leukocytes,Ua: NEGATIVE
Nitrite: NEGATIVE
RBC / HPF: NONE SEEN (ref 0–?)
Specific Gravity, Urine: 1.02 (ref 1.000–1.030)
Total Protein, Urine: 30 — AB
Urine Glucose: >=1000 — AB
Urobilinogen, UA: 0.2 (ref 0.0–1.0)
pH: 6 (ref 5.0–8.0)

## 2023-03-14 LAB — CBC WITH DIFFERENTIAL/PLATELET
Basophils Absolute: 0.1 10*3/uL (ref 0.0–0.1)
Basophils Relative: 0.8 % (ref 0.0–3.0)
Eosinophils Absolute: 0.1 10*3/uL (ref 0.0–0.7)
Eosinophils Relative: 0.9 % (ref 0.0–5.0)
HCT: 52.7 % — ABNORMAL HIGH (ref 39.0–52.0)
Hemoglobin: 17 g/dL (ref 13.0–17.0)
Lymphocytes Relative: 18.8 % (ref 12.0–46.0)
Lymphs Abs: 1.6 10*3/uL (ref 0.7–4.0)
MCHC: 32.3 g/dL (ref 30.0–36.0)
MCV: 90.8 fL (ref 78.0–100.0)
Monocytes Absolute: 0.7 10*3/uL (ref 0.1–1.0)
Monocytes Relative: 8 % (ref 3.0–12.0)
Neutro Abs: 6.2 10*3/uL (ref 1.4–7.7)
Neutrophils Relative %: 71.5 % (ref 43.0–77.0)
Platelets: 239 10*3/uL (ref 150.0–400.0)
RBC: 5.8 Mil/uL (ref 4.22–5.81)
RDW: 15 % (ref 11.5–15.5)
WBC: 8.7 10*3/uL (ref 4.0–10.5)

## 2023-03-14 LAB — HEPATIC FUNCTION PANEL
ALT: 28 U/L (ref 0–53)
AST: 22 U/L (ref 0–37)
Albumin: 4.1 g/dL (ref 3.5–5.2)
Alkaline Phosphatase: 23 U/L — ABNORMAL LOW (ref 39–117)
Bilirubin, Direct: 0.1 mg/dL (ref 0.0–0.3)
Total Bilirubin: 0.6 mg/dL (ref 0.2–1.2)
Total Protein: 7.6 g/dL (ref 6.0–8.3)

## 2023-03-14 LAB — HEMOGLOBIN A1C: Hgb A1c MFr Bld: 7.6 % — ABNORMAL HIGH (ref 4.6–6.5)

## 2023-03-14 LAB — MICROALBUMIN / CREATININE URINE RATIO
Creatinine,U: 64.9 mg/dL
Microalb Creat Ratio: 45.1 mg/g — ABNORMAL HIGH (ref 0.0–30.0)
Microalb, Ur: 29.3 mg/dL — ABNORMAL HIGH (ref 0.0–1.9)

## 2023-03-14 LAB — URIC ACID: Uric Acid, Serum: 7.9 mg/dL — ABNORMAL HIGH (ref 4.0–7.8)

## 2023-03-14 LAB — TSH: TSH: 1.33 u[IU]/mL (ref 0.35–5.50)

## 2023-03-14 MED ORDER — ALLOPURINOL 100 MG PO TABS
100.0000 mg | ORAL_TABLET | Freq: Every day | ORAL | 0 refills | Status: DC
Start: 2023-03-14 — End: 2023-06-12

## 2023-03-14 MED ORDER — SHINGRIX 50 MCG/0.5ML IM SUSR: 0.5000 mL | Freq: Once | INTRAMUSCULAR | 1 refills | Status: AC

## 2023-03-14 NOTE — Patient Instructions (Signed)
Health Maintenance, Male Adopting a healthy lifestyle and getting preventive care are important in promoting health and wellness. Ask your health care provider about: The right schedule for you to have regular tests and exams. Things you can do on your own to prevent diseases and keep yourself healthy. What should I know about diet, weight, and exercise? Eat a healthy diet  Eat a diet that includes plenty of vegetables, fruits, low-fat dairy products, and lean protein. Do not eat a lot of foods that are high in solid fats, added sugars, or sodium. Maintain a healthy weight Body mass index (BMI) is a measurement that can be used to identify possible weight problems. It estimates body fat based on height and weight. Your health care provider can help determine your BMI and help you achieve or maintain a healthy weight. Get regular exercise Get regular exercise. This is one of the most important things you can do for your health. Most adults should: Exercise for at least 150 minutes each week. The exercise should increase your heart rate and make you sweat (moderate-intensity exercise). Do strengthening exercises at least twice a week. This is in addition to the moderate-intensity exercise. Spend less time sitting. Even light physical activity can be beneficial. Watch cholesterol and blood lipids Have your blood tested for lipids and cholesterol at 85 years of age, then have this test every 5 years. You may need to have your cholesterol levels checked more often if: Your lipid or cholesterol levels are high. You are older than 85 years of age. You are at high risk for heart disease. What should I know about cancer screening? Many types of cancers can be detected early and may often be prevented. Depending on your health history and family history, you may need to have cancer screening at various ages. This may include screening for: Colorectal cancer. Prostate cancer. Skin cancer. Lung  cancer. What should I know about heart disease, diabetes, and high blood pressure? Blood pressure and heart disease High blood pressure causes heart disease and increases the risk of stroke. This is more likely to develop in people who have high blood pressure readings or are overweight. Talk with your health care provider about your target blood pressure readings. Have your blood pressure checked: Every 3-5 years if you are 18-39 years of age. Every year if you are 40 years old or older. If you are between the ages of 65 and 75 and are a current or former smoker, ask your health care provider if you should have a one-time screening for abdominal aortic aneurysm (AAA). Diabetes Have regular diabetes screenings. This checks your fasting blood sugar level. Have the screening done: Once every three years after age 45 if you are at a normal weight and have a low risk for diabetes. More often and at a younger age if you are overweight or have a high risk for diabetes. What should I know about preventing infection? Hepatitis B If you have a higher risk for hepatitis B, you should be screened for this virus. Talk with your health care provider to find out if you are at risk for hepatitis B infection. Hepatitis C Blood testing is recommended for: Everyone born from 1945 through 1965. Anyone with known risk factors for hepatitis C. Sexually transmitted infections (STIs) You should be screened each year for STIs, including gonorrhea and chlamydia, if: You are sexually active and are younger than 85 years of age. You are older than 85 years of age and your   health care provider tells you that you are at risk for this type of infection. Your sexual activity has changed since you were last screened, and you are at increased risk for chlamydia or gonorrhea. Ask your health care provider if you are at risk. Ask your health care provider about whether you are at high risk for HIV. Your health care provider  may recommend a prescription medicine to help prevent HIV infection. If you choose to take medicine to prevent HIV, you should first get tested for HIV. You should then be tested every 3 months for as long as you are taking the medicine. Follow these instructions at home: Alcohol use Do not drink alcohol if your health care provider tells you not to drink. If you drink alcohol: Limit how much you have to 0-2 drinks a day. Know how much alcohol is in your drink. In the [place] one drink equals one 12 oz bottle of beer (355 mL), one 5 oz glass of wine (148 mL), or one 1 oz glass of hard liquor (44 mL). Lifestyle Do not use any products that contain nicotine or tobacco. These products include cigarettes, chewing tobacco, and vaping devices, such as e-cigarettes. If you need help quitting, ask your health care provider. Do not use street drugs. Do not share needles. Ask your health care provider for help if you need support or information about quitting drugs. General instructions Schedule regular health, dental, and eye exams. Stay current with your vaccines. Tell your health care provider if: You often feel depressed. You have ever been abused or do not feel safe at home. Summary Adopting a healthy lifestyle and getting preventive care are important in promoting health and wellness. Follow your health care provider's instructions about healthy diet, exercising, and getting tested or screened for diseases. Follow your health care provider's instructions on monitoring your cholesterol and blood pressure. This information is not intended to replace advice given to you by your health care provider. Make sure you discuss any questions you have with your health care provider. Document Revised: 09/20/2020 Document Reviewed: 09/20/2020 Elsevier Patient Education  2024 Elsevier Inc.  

## 2023-03-19 ENCOUNTER — Telehealth: Payer: Self-pay

## 2023-03-19 NOTE — Progress Notes (Signed)
   Care Guide Note  03/19/2023 Name: Chanze Teagle MRN: [id number] DOB: January 15, 1936  Referred by: Etta Grandchild, MD Reason for referral : Care Coordination (Outreach to schedule with Pharm d )   Gracen Southwell is a 87 y.o. year old male who is a primary care patient of Yetta Barre Bernadene Bell, MD. [name] was referred to the pharmacist for assistance related to DM.    Successful contact was made with the patient to discuss pharmacy services including being ready for the pharmacist to call at least 5 minutes before the scheduled appointment time, to have medication bottles and any blood sugar or blood pressure readings ready for review. The patient agreed to meet with the pharmacist via with the pharmacist via telephone visit on (date/time).  03/27/2023  Penne Lash, RMA Care Guide [facility]  [place], [place] [address] Direct Dial: [phone] [email or web address]

## 2023-03-27 ENCOUNTER — Other Ambulatory Visit (INDEPENDENT_AMBULATORY_CARE_PROVIDER_SITE_OTHER): Payer: Medicare HMO | Admitting: Pharmacist

## 2023-03-27 DIAGNOSIS — M1000 Idiopathic gout, unspecified site: Secondary | ICD-10-CM

## 2023-03-27 DIAGNOSIS — E1129 Type 2 diabetes mellitus with other diabetic kidney complication: Secondary | ICD-10-CM

## 2023-03-27 MED ORDER — DAPAGLIFLOZIN PROPANEDIOL 10 MG PO TABS: 10.0000 mg | ORAL_TABLET | Freq: Every day | ORAL | Status: DC

## 2023-03-27 NOTE — Progress Notes (Signed)
03/27/2023 Name: Matthew Villanueva MRN: [id number] DOB: 1936-01-26  Chief Complaint  Patient presents with   Diabetes   Medication Management   Medication Access    Matthew Villanueva is a 87 y.o. year old male who presented for a telephone visit.   They were referred to the pharmacist by their PCP for assistance in managing diabetes.    Subjective:  Care Team: Primary Care Provider: Etta Grandchild, MD ; Next Scheduled Visit: none scheduled  Medication Access/Adherence  Current Pharmacy:  [facility] [id number] [place], [place] - [address] [place] [address] Phone: [phone] Fax: [phone]   Patient reports affordability concerns with their medications: Yes  Patient reports access/transportation concerns to their pharmacy: No  Patient reports adherence concerns with their medications:  Yes  [name] is very expensive in the donut hole, cost prohibitive.   Diabetes:  Current medications: Jardiance 25 mg daily, Rybelsus 14 mg daily  Current medication access support: Rybelsus PAP - already renewed per patient  Pt reports uncontrolled neuropathy  Gout: Current medications: colchicine PRN, allopurinol PRN PCP recently prescribed allopurinol because uric acid level was increased. Pt notes he does not take this daily and only PRN for flare. Pt denies recent gout flare sx Pt reports eating red meat several times per week and pork, shellfish and chicken liver about once per month, and occasional alcohol.  Objective:  Lab Results  Component Value Date   HGBA1C 7.6 (H) 03/14/2023    Lab Results  Component Value Date   CREATININE 1.08 03/14/2023   BUN 21 03/14/2023   NA 135 03/14/2023   K 4.1 03/14/2023   CL 99 03/14/2023   CO2 28 03/14/2023    Lab Results  Component Value Date   CHOL 124 06/05/2022   HDL 38 (L) 06/05/2022   LDLCALC 59 06/05/2022   LDLDIRECT 81.0 01/05/2022   TRIG 159 (H) 06/05/2022    CHOLHDL 3.3 06/05/2022    Medications Reviewed Today     Reviewed by Bonita Quin, RPH (Pharmacist) on 03/27/23 at 1332  Med List Status: <None>   Medication Order Taking? Sig Documenting Provider Last Dose Status Informant  allopurinol (ZYLOPRIM) 100 MG tablet [id number] No Take 1 tablet (100 mg total) by mouth daily.  Patient not taking: Reported on 03/27/2023   Etta Grandchild, MD Not Taking Active   aspirin EC 81 MG tablet [id number] Yes Take 81 mg by mouth daily. Swallow whole. [provider] Taking Active   blood glucose meter kit and supplies KIT [id number]  Use to test blood sugar up to twice daily. DX: E11.21 Etta Grandchild, MD  Active   colchicine 0.6 MG tablet [id number] No Take 1 tablet (0.6 mg total) by mouth daily.  Patient not taking: Reported on 03/27/2023   Corwin Levins, MD Not Taking Active   glucose blood (COOL BLOOD GLUCOSE TEST STRIPS) test strip [id number]  Use to test blood sugar up to twice daily. DX: E11.21 Etta Grandchild, MD  Active   JARDIANCE 25 MG TABS tablet [id number] Yes TAKE 1 TABLET BY MOUTH DAILY Corwin Levins, MD Taking Active   Lancets [facility] ULTRASOFT) lancets [id number]  Use to test blood sugar up to twice daily. DX: E11.21 Etta Grandchild, MD  Active   losartan-hydrochlorothiazide [facility]) 100-25 MG tablet [id number] Yes Take 1 tablet by mouth daily. Etta Grandchild, MD Taking Active   Multiple Vitamin (MULTIVITAMIN) tablet [id number] Yes Take 1  tablet by mouth daily. [provider] Taking Active   nystatin cream (MYCOSTATIN) [id number]  Apply 1 Application topically 2 (two) times daily.  Patient taking differently: Apply 1 Application topically as needed.   Etta Grandchild, MD  Active   [facility] VERIO test strip [id number]  USE TO TEST BLOOD SUGAR TWO TIMES A DAY Etta Grandchild, MD  Active   Semaglutide [facility]) 14 MG TABS [id number] Yes 1 tab by mouth once daily Corwin Levins, MD Taking Active                Assessment/Plan:   Diabetes: - Currently controlled, A1c goal <8% - Reviewed goal A1c, importance of BG control to slow progression of neuropathy - Recommend to switch Jardiance to [place] 10 mg daily - Meets financial criteria for [employer] patient assistance program through [employer]. Will initiate application and leave at front desk for patient to sign.   Gout: - Currently uncontrolled, uric acid level goal <6 - Reviewed foods to limit/avoid to limit uric acid - Advised patient to not take allopurinol PRN for gout flare as it can exacerbate flares. Reviewed that it is meant to be a daily medication to reduce uric acid.   Follow Up Plan: F/u on PAP in a few weeks   Arbutus Leas, PharmD, BCPS Clinical Pharmacist [facility] [phone]

## 2023-04-10 ENCOUNTER — Telehealth: Payer: Self-pay | Admitting: Pharmacist

## 2023-04-10 NOTE — Telephone Encounter (Signed)
Contacted patient regarding status of [employer] application for [employer]. Spoke to a representative at [employer] who explained the application was denied due to patient's income being higher than the elgibility criteria. She noted if the patient has a 1040 tax form that will show he is below the 300% FPL, that can be submitted to [employer].  Left message for patient stating application status and left a call back number.  Arbutus Leas, PharmD, BCPS Clinical Pharmacist Practitioner [facility] [phone]

## 2023-04-18 ENCOUNTER — Telehealth: Payer: Self-pay

## 2023-04-18 NOTE — Telephone Encounter (Signed)
Pt has not responded to Mychart message regarding 2025 re enrollment for Rybelsus through [employer] patient assistance program.

## 2023-04-18 NOTE — Telephone Encounter (Signed)
Fill and mail out patients portion for [employer] (Rybelsus 14)

## 2023-04-23 ENCOUNTER — Ambulatory Visit (INDEPENDENT_AMBULATORY_CARE_PROVIDER_SITE_OTHER): Payer: Medicare HMO | Admitting: Family Medicine

## 2023-04-23 ENCOUNTER — Encounter: Payer: Self-pay | Admitting: Family Medicine

## 2023-04-23 VITALS — BP 142/80 | HR 64 | Temp 97.9°F | Resp 20 | Ht 70.0 in | Wt 240.0 lb

## 2023-04-23 DIAGNOSIS — J069 Acute upper respiratory infection, unspecified: Secondary | ICD-10-CM

## 2023-04-23 DIAGNOSIS — G629 Polyneuropathy, unspecified: Secondary | ICD-10-CM | POA: Diagnosis not present

## 2023-04-23 NOTE — Progress Notes (Signed)
Assessment & Plan:  1. Viral URI Education provided on viral URI.  Discussed typical duration and progression of viral illnesses.  Encouraged symptom management including throat lozenges, chloraseptic spray, warm salt water gargles, hot tea/honey, cough syrup (Delsym), Tylenol cold day and night, Vicks, and a humidifier at night.   2. Peripheral polyneuropathy Handicap placard provided.    Follow up plan: Return if symptoms worsen or fail to improve.  Deliah Boston, MSN, APRN, FNP-C  Subjective:  HPI: Robert Labrada is a 87 y.o. male presenting on 04/23/2023 for Cough (Dry cough, some sneezing and runny nose - woke with symptoms today. /No fever, No GI upset, No body aches)  Patient complains of cough, runny nose, and sneezing. He denies fever, shortness of breath, wheezing, nausea, vomiting, diarrhea, and body aches . Onset of symptoms was this morning. He is drinking plenty of fluids. Evaluation to date: none. Treatment to date:  Amoxicillin, ibuprofen, and Tylenol that he was given for a tooth; he has 4 more days of the amoxicillin . He does not smoke.   Patient is also requesting a handicap placard for his car due to neuropathy and balance issues.    ROS: Negative unless specifically indicated above in HPI.   Relevant past medical history reviewed and updated as indicated.   Allergies and medications reviewed and updated.   Current Outpatient Medications:    allopurinol (ZYLOPRIM) 100 MG tablet, Take 1 tablet (100 mg total) by mouth daily., Disp: 90 tablet, Rfl: 0   amoxicillin (AMOXIL) 500 MG capsule, Take 500 mg by mouth 3 (three) times daily., Disp: , Rfl:    aspirin EC 81 MG tablet, Take 81 mg by mouth daily. Swallow whole., Disp: , Rfl:    blood glucose meter kit and supplies KIT, Use to test blood sugar up to twice daily. DX: E11.21, Disp: 1 each, Rfl: 0   dapagliflozin propanediol (FARXIGA) 10 MG TABS tablet, Take 1 tablet (10 mg total) by mouth daily before  breakfast., Disp: , Rfl:    glucose blood (COOL BLOOD GLUCOSE TEST STRIPS) test strip, Use to test blood sugar up to twice daily. DX: E11.21, Disp: 100 each, Rfl: 12   JARDIANCE 25 MG TABS tablet, Take 25 mg by mouth daily., Disp: , Rfl:    Lancets (ONETOUCH ULTRASOFT) lancets, Use to test blood sugar up to twice daily. DX: E11.21, Disp: 100 each, Rfl: 12   losartan-hydrochlorothiazide (HYZAAR) 100-25 MG tablet, Take 1 tablet by mouth daily., Disp: 90 tablet, Rfl: 0   Multiple Vitamin (MULTIVITAMIN) tablet, Take 1 tablet by mouth daily., Disp: , Rfl:    ONETOUCH VERIO test strip, USE TO TEST BLOOD SUGAR TWO TIMES A DAY, Disp: 150 strip, Rfl: 5   Semaglutide (RYBELSUS) 14 MG TABS, 1 tab by mouth once daily, Disp: 90 tablet, Rfl: 3   colchicine 0.6 MG tablet, Take 1 tablet (0.6 mg total) by mouth daily. (Patient not taking: Reported on 03/27/2023), Disp: 90 tablet, Rfl: 0   nystatin cream (MYCOSTATIN), Apply 1 Application topically 2 (two) times daily. (Patient not taking: Reported on 04/23/2023), Disp: 30 g, Rfl: 2  Allergies  Allergen Reactions   Livalo [Pitavastatin] Other (See Comments)    Muscle aches   Crestor [Rosuvastatin]     Muscle aches   Metformin And Related Diarrhea   Zetia [Ezetimibe] Diarrhea   Pravastatin     Muscle aches    Objective:   BP (!) 142/80   Pulse 64   Temp 97.9 F (  36.6 C)   Resp 20   Ht 5\' 10"  (1.778 m)   Wt 240 lb (108.9 kg)   SpO2 95%   BMI 34.44 kg/m    Physical Exam Vitals reviewed.  Constitutional:      General: He is not in acute distress.    Appearance: Normal appearance. He is not ill-appearing, toxic-appearing or diaphoretic.  HENT:     Head: Normocephalic and atraumatic.     Right Ear: Tympanic membrane, ear canal and external ear normal. There is no impacted cerumen.     Left Ear: Tympanic membrane, ear canal and external ear normal. There is no impacted cerumen.     Nose: Congestion present.     Right Sinus: No maxillary sinus  tenderness or frontal sinus tenderness.     Left Sinus: No maxillary sinus tenderness or frontal sinus tenderness.     Mouth/Throat:     Mouth: Mucous membranes are moist.     Pharynx: Oropharynx is clear. No oropharyngeal exudate or posterior oropharyngeal erythema.     Tonsils: No tonsillar exudate or tonsillar abscesses.  Eyes:     General: No scleral icterus.       Right eye: No discharge.        Left eye: No discharge.     Conjunctiva/sclera: Conjunctivae normal.  Cardiovascular:     Rate and Rhythm: Normal rate. Rhythm irregular.  Pulmonary:     Effort: Pulmonary effort is normal. No respiratory distress.  Musculoskeletal:        General: Normal range of motion.     Cervical back: Normal range of motion.  Lymphadenopathy:     Cervical: No cervical adenopathy.  Skin:    General: Skin is warm and dry.  Neurological:     Mental Status: He is alert and oriented to person, place, and time. Mental status is at baseline.  Psychiatric:        Mood and Affect: Mood normal.        Behavior: Behavior normal.        Thought Content: Thought content normal.        Judgment: Judgment normal.

## 2023-04-23 NOTE — Patient Instructions (Signed)
Throat lozenges, chloraseptic spray, warm salt water gargles, hot tea/honey, cough syrup (Delsym), Tylenol Cold day and night, Vicks, and a humidifier at night.

## 2023-04-24 DIAGNOSIS — E118 Type 2 diabetes mellitus with unspecified complications: Secondary | ICD-10-CM | POA: Diagnosis not present

## 2023-04-27 ENCOUNTER — Ambulatory Visit (INDEPENDENT_AMBULATORY_CARE_PROVIDER_SITE_OTHER): Payer: Medicare HMO | Admitting: Family Medicine

## 2023-04-27 ENCOUNTER — Encounter: Payer: Self-pay | Admitting: Family Medicine

## 2023-04-27 VITALS — BP 108/68 | HR 69 | Temp 98.4°F | Ht 70.0 in | Wt 241.6 lb

## 2023-04-27 DIAGNOSIS — J069 Acute upper respiratory infection, unspecified: Secondary | ICD-10-CM

## 2023-04-27 DIAGNOSIS — R051 Acute cough: Secondary | ICD-10-CM | POA: Diagnosis not present

## 2023-04-27 MED ORDER — AZITHROMYCIN 250 MG PO TABS: ORAL_TABLET | ORAL | 0 refills | Status: DC

## 2023-04-27 NOTE — Progress Notes (Unsigned)
Acute Office Visit  Subjective:     Patient ID: Matthew Villanueva, male    DOB: Aug 13, 1935, 87 y.o.   MRN: [id number]  Chief Complaint  Patient presents with   Diarrhea   Cough    Cold/flu like symptoms since Monday. Cough with slightly green phlegm. PT was seen by [place] that day and symptoms have not gotten better. PT notes not having Covid or flu test done that day or at home. PT currently treating with tylenol, vitamin C, vitamin D, and Zinc   Fatigue   Fever    HPI Patient is in today for evaluation of productive cough, fatigue, runny nose, sneezing for the last 5 days. Reports that he is also having some diarrhea. Has been taking amoxicillin for an infected tooth and today is his last day.  This has not made a difference in his symptoms. Denies known sick contacts. Denies abdominal pain, nausea, vomiting, rash, fever, chills, other symptoms.  Medical hx as outlined below.  ROS Per HPI      Objective:    BP 108/68 (BP Location: Left Arm, Patient Position: Sitting, Cuff Size: Normal)   Pulse 69   Temp 98.4 F (36.9 C) (Oral)   Ht 5\' 10"  (1.778 m)   Wt 241 lb 9.6 oz (109.6 kg)   SpO2 94%   BMI 34.67 kg/m    Physical Exam Vitals and nursing note reviewed.  HENT:     Head: Normocephalic and atraumatic.     Right Ear: Tympanic membrane and ear canal normal.     Left Ear: Tympanic membrane and ear canal normal.     Nose: Congestion present.     Mouth/Throat:     Mouth: Mucous membranes are moist.     Pharynx: Oropharynx is clear. No oropharyngeal exudate or posterior oropharyngeal erythema.     Comments: Cobblestoning Eyes:     Extraocular Movements: Extraocular movements intact.  Cardiovascular:     Rate and Rhythm: Normal rate and regular rhythm.     Heart sounds: Normal heart sounds.  Pulmonary:     Effort: Pulmonary effort is normal. No respiratory distress.     Breath sounds: Wheezing present. No rhonchi or rales.     Comments: Productive  cough Musculoskeletal:     Cervical back: Normal range of motion.  Lymphadenopathy:     Cervical: Cervical adenopathy present.  Skin:    General: Skin is warm and dry.  Neurological:     Mental Status: He is alert.    Results for orders placed or performed in visit on 04/27/23  POC COVID-19  Result Value Ref Range   SARS Coronavirus 2 Ag Negative Negative  POC Influenza A&B (Binax test)  Result Value Ref Range   Influenza A, POC Negative Negative   Influenza B, POC Negative Negative        Assessment & Plan:  1. Acute cough (Primary)  - POC COVID-19 - POC Influenza A&B (Binax test)  2. Upper respiratory tract infection, unspecified type  - azithromycin (ZITHROMAX Z-PAK) 250 MG tablet; Take 2 tablets (500 mg) PO today, then 1 tablet (250 mg) PO daily x4 days.  Dispense: 6 tablet; Refill: 0  COVID test is negative today.  Flu test is also negative today.  I have sent in azithromycin for you to take.  Take 2 tablets today, then 1 tablet daily for the next 4 days.  DO NOT take colchicine with azithromycin.  Follow-up with me for new or worsening symptoms.  Meds ordered this encounter  Medications   azithromycin (ZITHROMAX Z-PAK) 250 MG tablet    Sig: Take 2 tablets (500 mg) PO today, then 1 tablet (250 mg) PO daily x4 days.    Dispense:  6 tablet    Refill:  0    Return if symptoms worsen or fail to improve.  Moshe Cipro, FNP

## 2023-04-27 NOTE — Patient Instructions (Addendum)
COVID test is negative today.  Flu test is also negative today.  I have sent in azithromycin for you to take.  Take 2 tablets today, then 1 tablet daily for the next 4 days.  DO NOT take colchicine with azithromycin.  Follow-up with me for new or worsening symptoms.

## 2023-04-29 LAB — POC INFLUENZA A&B (BINAX/QUICKVUE)
Influenza A, POC: NEGATIVE
Influenza B, POC: NEGATIVE

## 2023-04-29 LAB — POC COVID19 BINAXNOW: SARS Coronavirus 2 Ag: NEGATIVE

## 2023-05-02 ENCOUNTER — Other Ambulatory Visit: Payer: Self-pay | Admitting: Internal Medicine

## 2023-05-02 DIAGNOSIS — E1121 Type 2 diabetes mellitus with diabetic nephropathy: Secondary | ICD-10-CM

## 2023-05-02 DIAGNOSIS — I10 Essential (primary) hypertension: Secondary | ICD-10-CM

## 2023-06-12 ENCOUNTER — Other Ambulatory Visit: Payer: Self-pay | Admitting: Internal Medicine

## 2023-06-12 DIAGNOSIS — M1A09X0 Idiopathic chronic gout, multiple sites, without tophus (tophi): Secondary | ICD-10-CM

## 2023-06-19 ENCOUNTER — Telehealth: Payer: Self-pay

## 2023-06-19 NOTE — Telephone Encounter (Signed)
PLEASE BE ADVISED CALLED PT TO FOLLOW UP ON PAP APP FOR RYBELSUS(NOVO NORDISK) HE STATED HE DROP PAPER WORK OFF AT OFFICE OF Sanda Linger AT FRONT DESK.

## 2023-06-20 NOTE — Telephone Encounter (Signed)
 Faxed PROVIDER PAGES TO PROVIDER OFFICE OF Sandra Crouch FOR REVIEW AND PROCESSING   PLEASE BE ADVISED

## 2023-06-29 NOTE — Telephone Encounter (Signed)
PAP: Application for Rybelsus has been submitted to [employer], via fax    PLEASE BE ADVISED

## 2023-07-05 NOTE — Telephone Encounter (Signed)
RE Faxed PROVIDER PAGE BACK WITH CORRECTION OF NPI and  to [employer] at [phone]   PLEASE BE ADVISED

## 2023-07-10 NOTE — Telephone Encounter (Signed)
 PAP: Patient assistance application for Rybelsus has been approved by PAP Companies: [employer] from 07/10/2023 to 05/14/2024. Medication should be delivered to PAP Delivery: Provider's office. For further shipping updates, please [employer] at [phone]. Patient ID is: [id number]  PLEASE BE ADVISED I HAVE NOTIFIED PT IN MY CHART.

## 2023-07-25 ENCOUNTER — Other Ambulatory Visit: Payer: Self-pay | Admitting: Internal Medicine

## 2023-07-30 ENCOUNTER — Other Ambulatory Visit: Payer: Self-pay | Admitting: Internal Medicine

## 2023-07-30 DIAGNOSIS — E1121 Type 2 diabetes mellitus with diabetic nephropathy: Secondary | ICD-10-CM

## 2023-07-30 DIAGNOSIS — I10 Essential (primary) hypertension: Secondary | ICD-10-CM

## 2023-08-08 ENCOUNTER — Telehealth: Payer: Self-pay

## 2023-08-08 NOTE — Telephone Encounter (Signed)
 Patient has been made aware that his medication has been delivered and he can pick it up.  Medication placed up front.

## 2023-08-27 ENCOUNTER — Other Ambulatory Visit: Payer: Self-pay | Admitting: Internal Medicine

## 2023-08-27 NOTE — Telephone Encounter (Signed)
 Copied from CRM [id number]. Topic: Clinical - Medication Refill >> Aug 27, 2023  8:51 AM Everlene Hobby D wrote: Most Recent Primary Care Visit:  Provider: Wellington Half  Department: [facility]  Visit Type: SAME DAY  Date: 04/27/2023  Medication: empagliflozin (JARDIANCE) 25 MG TABS tablet  Has the patient contacted their pharmacy? Yes (Agent: If no, request that the patient contact the pharmacy for the refill. If patient does not wish to contact the pharmacy document the reason why and proceed with request.) (Agent: If yes, when and what did the pharmacy advise?)  Is this the correct pharmacy for this prescription? Yes If no, delete pharmacy and type the correct one.  This is the patient's preferred pharmacy:  [facility] [id number] - [place], [place] - [address] [place] [address] Phone: [phone] Fax: [phone]   Has the prescription been filled recently? No  Is the patient out of the medication? Yes  Has the patient been seen for an appointment in the last year OR does the patient have an upcoming appointment? Yes  Can we respond through MyChart? No prefers a phone call  Agent: Please be advised that Rx refills may take up to 3 business days. We ask that you follow-up with your pharmacy.

## 2023-08-28 ENCOUNTER — Other Ambulatory Visit: Payer: Self-pay | Admitting: Internal Medicine

## 2023-08-28 NOTE — Telephone Encounter (Signed)
 Copied from CRM [id number]. Topic: Clinical - Medication Refill >> Aug 27, 2023  8:51 AM Everlene Hobby D wrote: Most Recent Primary Care Visit:  Provider: Wellington Half  Department: [facility]  Visit Type: SAME DAY  Date: 04/27/2023  Medication: empagliflozin (JARDIANCE) 25 MG TABS tablet  Has the patient contacted their pharmacy? Yes (Agent: If no, request that the patient contact the pharmacy for the refill. If patient does not wish to contact the pharmacy document the reason why and proceed with request.) (Agent: If yes, when and what did the pharmacy advise?)  Is this the correct pharmacy for this prescription? Yes If no, delete pharmacy and type the correct one.  This is the patient's preferred pharmacy:  [facility] [id number] - [place], [place] - [address] [place] [address] Phone: [phone] Fax: [phone]   Has the prescription been filled recently? No  Is the patient out of the medication? Yes  Has the patient been seen for an appointment in the last year OR does the patient have an upcoming appointment? Yes  Can we respond through MyChart? No prefers a phone call  Agent: Please be advised that Rx refills may take up to 3 business days. We ask that you follow-up with your pharmacy. >> Aug 28, 2023 10:44 AM Kita Perish H wrote: Patient calling to check status of his refill request for the empagliflozin (JARDIANCE) 25 MG TABS tablet  message never routed, patient states he has been out of medication for 3 days.  Please reach out to patient, thanks  [facility] [phone]

## 2023-08-29 ENCOUNTER — Encounter: Payer: Self-pay | Admitting: Family Medicine

## 2023-08-29 ENCOUNTER — Ambulatory Visit: Admitting: Family Medicine

## 2023-08-29 VITALS — BP 102/66 | HR 72 | Temp 97.3°F | Ht 70.0 in | Wt 239.0 lb

## 2023-08-29 DIAGNOSIS — H6123 Impacted cerumen, bilateral: Secondary | ICD-10-CM

## 2023-08-29 DIAGNOSIS — Z974 Presence of external hearing-aid: Secondary | ICD-10-CM

## 2023-08-29 DIAGNOSIS — N481 Balanitis: Secondary | ICD-10-CM | POA: Diagnosis not present

## 2023-08-29 MED ORDER — NYSTATIN 100000 UNIT/GM EX CREA: 1.0000 | TOPICAL_CREAM | Freq: Two times a day (BID) | CUTANEOUS | 0 refills | Status: DC

## 2023-08-29 MED ORDER — EMPAGLIFLOZIN 25 MG PO TABS
25.0000 mg | ORAL_TABLET | Freq: Every day | ORAL | 0 refills | Status: DC
Start: 2023-08-29 — End: 2023-09-24

## 2023-08-29 NOTE — Progress Notes (Signed)
PRE-PROCEDURE EXAM: bilateral TM cannot be visualized due to total occlusion/impaction of the ear canal.  PROCEDURE INDICATION: remove wax to visualize ear drum & relieve discomfort  CONSENT:  Verbal     PROCEDURE NOTE:     bilateral EAR:  I used warm water irrigation under direct visualization with the otoscope to free the wax bolus from the ear canal.     POST- PROCEDURE EXAM: TMs successfully visualized and found to have no erythema     The patient tolerated the procedure well.

## 2023-08-29 NOTE — Addendum Note (Signed)
 Addended by: Carys Malina E on: 08/29/2023 03:02 PM   Modules accepted: Orders

## 2023-08-29 NOTE — Progress Notes (Signed)
 Subjective:  Matthew Villanueva is a 88 y.o. male who presents for decreased hearing bilaterally.  States he believes he has wax buildup with a history of the same.  He wears hearing aids in both ears.  Denies ear pain.  He denies any URI symptoms, cough, chest pain, shortness of breath.    ROS as in subjective.   Objective: Vitals:   08/29/23 1417  BP: 102/66  Pulse: 72  Temp: (!) 97.3 F (36.3 C)  SpO2: 96%    General appearance: Alert, WD/WN, no distress                             Skin: warm, no rash                           Head: no sinus tenderness                            Eyes: conjunctiva normal, corneas clear, PERRLA                            Ears: bilateral ear canals with cerumen impaction. Post lavage, pearly TMs, external ear canals normal                          Nose: septum midline, no drainage              Mouth/throat: MMM                           Neck: supple, no adenopathy, no thyromegaly, nontender                          Heart: RRR                         Lungs: CTA bilaterally, no wheezes, rales, or rhonchi      Assessment: Hearing loss due to cerumen impaction, bilateral  Balanitis - Plan: nystatin cream (MYCOSTATIN)  Wears hearing aid in both ears   Plan: Cerumen impaction bilaterally with decreased hearing-improved following ear lavage bilaterally performed by CMA.  Provided him with 30 day refill of Jardiance and Nystatin until he can follow up with PCP.

## 2023-09-17 ENCOUNTER — Other Ambulatory Visit: Payer: Self-pay | Admitting: Internal Medicine

## 2023-09-17 DIAGNOSIS — M1A09X0 Idiopathic chronic gout, multiple sites, without tophus (tophi): Secondary | ICD-10-CM

## 2023-09-21 ENCOUNTER — Other Ambulatory Visit: Payer: Self-pay | Admitting: Internal Medicine

## 2023-09-21 DIAGNOSIS — E1121 Type 2 diabetes mellitus with diabetic nephropathy: Secondary | ICD-10-CM

## 2023-09-24 ENCOUNTER — Encounter: Payer: Self-pay | Admitting: Internal Medicine

## 2023-09-24 ENCOUNTER — Ambulatory Visit (INDEPENDENT_AMBULATORY_CARE_PROVIDER_SITE_OTHER): Admitting: Internal Medicine

## 2023-09-24 VITALS — BP 140/70 | HR 73 | Temp 97.8°F | Ht 70.0 in | Wt 235.6 lb

## 2023-09-24 DIAGNOSIS — E785 Hyperlipidemia, unspecified: Secondary | ICD-10-CM | POA: Diagnosis not present

## 2023-09-24 DIAGNOSIS — Z7984 Long term (current) use of oral hypoglycemic drugs: Secondary | ICD-10-CM | POA: Diagnosis not present

## 2023-09-24 DIAGNOSIS — G4733 Obstructive sleep apnea (adult) (pediatric): Secondary | ICD-10-CM

## 2023-09-24 DIAGNOSIS — E1121 Type 2 diabetes mellitus with diabetic nephropathy: Secondary | ICD-10-CM

## 2023-09-24 DIAGNOSIS — I10 Essential (primary) hypertension: Secondary | ICD-10-CM

## 2023-09-24 DIAGNOSIS — E1140 Type 2 diabetes mellitus with diabetic neuropathy, unspecified: Secondary | ICD-10-CM

## 2023-09-24 DIAGNOSIS — R9431 Abnormal electrocardiogram [ECG] [EKG]: Secondary | ICD-10-CM | POA: Diagnosis not present

## 2023-09-24 DIAGNOSIS — M1A09X0 Idiopathic chronic gout, multiple sites, without tophus (tophi): Secondary | ICD-10-CM | POA: Diagnosis not present

## 2023-09-24 DIAGNOSIS — E781 Pure hyperglyceridemia: Secondary | ICD-10-CM

## 2023-09-24 DIAGNOSIS — N481 Balanitis: Secondary | ICD-10-CM

## 2023-09-24 DIAGNOSIS — M1A0710 Idiopathic chronic gout, right ankle and foot, without tophus (tophi): Secondary | ICD-10-CM

## 2023-09-24 LAB — BASIC METABOLIC PANEL WITH GFR
BUN: 19 mg/dL (ref 6–23)
CO2: 29 meq/L (ref 19–32)
Calcium: 9.2 mg/dL (ref 8.4–10.5)
Chloride: 100 meq/L (ref 96–112)
Creatinine, Ser: 1.12 mg/dL (ref 0.40–1.50)
GFR: 59.88 mL/min — ABNORMAL LOW (ref >60.00)
Glucose, Bld: 158 mg/dL — ABNORMAL HIGH (ref 70–99)
Potassium: 4.3 meq/L (ref 3.5–5.1)
Sodium: 137 meq/L (ref 135–145)

## 2023-09-24 LAB — CBC WITH DIFFERENTIAL/PLATELET
Basophils Absolute: 0.1 10*3/uL (ref 0.0–0.1)
Basophils Relative: 1 % (ref 0.0–3.0)
Eosinophils Absolute: 0.1 10*3/uL (ref 0.0–0.7)
Eosinophils Relative: 1.3 % (ref 0.0–5.0)
HCT: 51.6 % (ref 39.0–52.0)
Hemoglobin: 17.3 g/dL — ABNORMAL HIGH (ref 13.0–17.0)
Lymphocytes Relative: 20.8 % (ref 12.0–46.0)
Lymphs Abs: 1.9 10*3/uL (ref 0.7–4.0)
MCHC: 33.5 g/dL (ref 30.0–36.0)
MCV: 89.1 fl (ref 78.0–100.0)
Monocytes Absolute: 0.8 10*3/uL (ref 0.1–1.0)
Monocytes Relative: 8.6 % (ref 3.0–12.0)
Neutro Abs: 6.2 10*3/uL (ref 1.4–7.7)
Neutrophils Relative %: 68.3 % (ref 43.0–77.0)
Platelets: 246 10*3/uL (ref 150.0–400.0)
RBC: 5.79 Mil/uL (ref 4.22–5.81)
RDW: 14.4 % (ref 11.5–15.5)
WBC: 9.1 10*3/uL (ref 4.0–10.5)

## 2023-09-24 LAB — LIPID PANEL
Cholesterol: 171 mg/dL (ref 0–200)
HDL: 40.5 mg/dL (ref >39.00)
LDL Cholesterol: 61 mg/dL (ref 0–99)
NonHDL: 130.39
Total CHOL/HDL Ratio: 4
Triglycerides: 346 mg/dL — ABNORMAL HIGH (ref 0.0–149.0)
VLDL: 69.2 mg/dL — ABNORMAL HIGH (ref 0.0–40.0)

## 2023-09-24 LAB — URINALYSIS, ROUTINE W REFLEX MICROSCOPIC
Bilirubin Urine: NEGATIVE
Hgb urine dipstick: NEGATIVE
Ketones, ur: NEGATIVE
Leukocytes,Ua: NEGATIVE
Nitrite: NEGATIVE
Specific Gravity, Urine: 1.01 (ref 1.000–1.030)
Urine Glucose: >=1000 — AB
Urobilinogen, UA: 0.2 (ref 0.0–1.0)
pH: 6.5 (ref 5.0–8.0)

## 2023-09-24 LAB — HEMOGLOBIN A1C: Hgb A1c MFr Bld: 7.3 % — ABNORMAL HIGH (ref 4.6–6.5)

## 2023-09-24 LAB — MICROALBUMIN / CREATININE URINE RATIO
Creatinine,U: 73.1 mg/dL
Microalb Creat Ratio: 175.3 mg/g — ABNORMAL HIGH (ref 0.0–30.0)
Microalb, Ur: 12.8 mg/dL — ABNORMAL HIGH (ref 0.0–1.9)

## 2023-09-24 LAB — HEPATIC FUNCTION PANEL
ALT: 22 U/L (ref 0–53)
AST: 22 U/L (ref 0–37)
Albumin: 4 g/dL (ref 3.5–5.2)
Alkaline Phosphatase: 21 U/L — ABNORMAL LOW (ref 39–117)
Bilirubin, Direct: 0.1 mg/dL (ref 0.0–0.3)
Total Bilirubin: 0.5 mg/dL (ref 0.2–1.2)
Total Protein: 7.4 g/dL (ref 6.0–8.3)

## 2023-09-24 LAB — URIC ACID: Uric Acid, Serum: 6.9 mg/dL (ref 4.0–7.8)

## 2023-09-24 MED ORDER — NYSTATIN 100000 UNIT/GM EX CREA: 1.0000 | TOPICAL_CREAM | Freq: Two times a day (BID) | CUTANEOUS | 3 refills | Status: AC

## 2023-09-24 MED ORDER — ALLOPURINOL 100 MG PO TABS: 100.0000 mg | ORAL_TABLET | Freq: Every day | ORAL | 0 refills | Status: DC

## 2023-09-24 MED ORDER — FREESTYLE LIBRE 3 PLUS SENSOR MISC: 1.0000 | 1 refills | Status: AC

## 2023-09-24 MED ORDER — OZEMPIC (0.25 OR 0.5 MG/DOSE) 2 MG/3ML ~~LOC~~ SOPN
0.2500 mg | PEN_INJECTOR | SUBCUTANEOUS | 0 refills | Status: DC
Start: 2023-09-24 — End: 2023-10-16

## 2023-09-24 MED ORDER — INSULIN PEN NEEDLE 32G X 6 MM MISC: 1.0000 | 0 refills | Status: AC

## 2023-09-24 MED ORDER — LOSARTAN POTASSIUM-HCTZ 100-25 MG PO TABS: 1.0000 | ORAL_TABLET | Freq: Every day | ORAL | 0 refills | Status: DC

## 2023-09-24 NOTE — Progress Notes (Unsigned)
 Subjective:  Patient ID: Matthew Villanueva, male    DOB: 1935/08/24  Age: 88 y.o. MRN: [id number]  CC: Hypertension, Hyperlipidemia, and Diabetes   HPI Matthew Villanueva presents for f/up ----  Discussed the use of AI scribe software for clinical note transcription with the patient, who gave verbal consent to proceed.  History of Present Illness   Lucino Rash is an 88 year old male who presents for follow-up on hearing loss, yeast infection, and blood sugar monitoring.  He has hearing aids and notes improved hearing after a recent cleaning of his ears and hearing aids. He hears well with the hearing aids in place, although he struggles to hear if someone turns his back.  He is experiencing a recurrent yeast infection on his penis and has had this issue before. He ran out of the cream previously used for treatment and is seeking a refill. No trouble urinating.  He has a history of sleep apnea and notes it has been six or seven years since his last sleep apnea test. He has tried various treatments, including a mask and oral devices, but none have been effective. He experiences poor sleep quality, waking up tired, and frequently getting up during the night to urinate.  He discusses his blood sugar monitoring and mentions needing strips for this purpose. He has been feeling fatigued but attributes some improvement to taking vitamins. No chest pain, shortness of breath, dizziness, or lightheadedness.  He inquires about a generic version of Jardiance  due to its high cost, but acknowledges receiving another medication for free, which helps with expenses.       Outpatient Medications Prior to Visit  Medication Sig Dispense Refill   aspirin EC 81 MG tablet Take 81 mg by mouth daily. Swallow whole.     blood glucose meter kit and supplies KIT Use to test blood sugar up to twice daily. DX: E11.21 1 each 0   colchicine  0.6 MG tablet Take 1 tablet (0.6 mg total) by mouth daily. 90  tablet 0   glucose blood (COOL BLOOD GLUCOSE TEST STRIPS) test strip Use to test blood sugar up to twice daily. DX: E11.21 100 each 12   Lancets (ONETOUCH ULTRASOFT) lancets Use to test blood sugar up to twice daily. DX: E11.21 100 each 12   Multiple Vitamin (MULTIVITAMIN) tablet Take 1 tablet by mouth daily.     ONETOUCH VERIO test strip USE TO TEST BLOOD SUGAR TWO TIMES A DAY 150 strip 5   allopurinol  (ZYLOPRIM ) 100 MG tablet TAKE 1 TABLET BY MOUTH DAILY 90 tablet 0   empagliflozin  (JARDIANCE ) 25 MG TABS tablet Take 1 tablet (25 mg total) by mouth daily. 30 tablet 0   losartan -hydrochlorothiazide (HYZAAR) 100-25 MG tablet TAKE 1 TABLET BY MOUTH DAILY 90 tablet 0   nystatin  cream (MYCOSTATIN ) Apply 1 Application topically 2 (two) times daily. 30 g 0   Semaglutide  (RYBELSUS ) 14 MG TABS 1 tab by mouth once daily 90 tablet 3   No facility-administered medications prior to visit.    ROS Review of Systems  Objective:  BP (!) 140/70 (BP Location: Left Arm, Patient Position: Sitting, Cuff Size: Normal)   Pulse 73   Temp 97.8 F (36.6 C) (Oral)   Ht 5\' 10"  (1.778 m)   Wt 235 lb 9.6 oz (106.9 kg)   SpO2 94%   BMI 33.81 kg/m   BP Readings from Last 3 Encounters:  09/24/23 (!) 140/70  08/29/23 102/66  04/27/23 108/68  Wt Readings from Last 3 Encounters:  09/24/23 235 lb 9.6 oz (106.9 kg)  08/29/23 239 lb (108.4 kg)  04/27/23 241 lb 9.6 oz (109.6 kg)    Physical Exam Vitals reviewed.  Constitutional:      Appearance: Normal appearance.  HENT:     Mouth/Throat:     Mouth: Mucous membranes are moist.  Eyes:     General: No scleral icterus. Cardiovascular:     Rate and Rhythm: Normal rate and regular rhythm.     Heart sounds: No murmur heard.    No friction rub. No gallop.     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     Breath sounds: No stridor. No wheezing, rhonchi or  rales.  Abdominal:     General: Abdomen is flat.     Palpations: There is no mass.     Tenderness: There is no abdominal tenderness. There is no guarding.     Hernia: No hernia is present.  Musculoskeletal:        General: Normal range of motion.     Cervical back: Neck supple.     Right lower leg: No edema.     Left lower leg: No edema.  Lymphadenopathy:     Cervical: No cervical adenopathy.  Skin:    General: Skin is warm and dry.  Neurological:     General: No focal deficit present.     Mental Status: He is alert. Mental status is at baseline.  Psychiatric:        Mood and Affect: Mood normal.        Behavior: Behavior normal.        Thought Content: Thought content normal.        Judgment: Judgment normal.     Lab Results  Component Value Date   WBC 9.1 09/24/2023   HGB 17.3 (H) 09/24/2023   HCT 51.6 09/24/2023   PLT 246.0 09/24/2023   GLUCOSE 158 (H) 09/24/2023   CHOL 171 09/24/2023   TRIG 346.0 (H) 09/24/2023   HDL 40.50 09/24/2023   LDLDIRECT 81.0 01/05/2022   LDLCALC 61 09/24/2023   ALT 22 09/24/2023   AST 22 09/24/2023   NA 137 09/24/2023   K 4.3 09/24/2023   CL 100 09/24/2023   CREATININE 1.12 09/24/2023   BUN 19 09/24/2023   CO2 29 09/24/2023   TSH 1.33 03/14/2023   PSA 2.86 12/03/2017   HGBA1C 7.3 (H) 09/24/2023   MICROALBUR 12.8 (H) 09/24/2023    VAS US  CAROTID Result Date: 04/05/2022 Carotid Arterial Duplex Study Patient Name:  Matthew Villanueva  Date of Exam:   04/04/2022 Medical Rec #: [id number]             Accession #:    [id number] Date of Birth: [birth year]            Patient Gender: M Patient Age:   41 years Exam Location:  [facility] Procedure:      VAS US  CAROTID Referring Phys: Morey Ar --------------------------------------------------------------------------------  Indications:       Remote history of TIA with mild carotid artery stenosis                    presents for follow-up evaluation. Patient denies any                     cerebrovascular symptoms at  this time. Risk Factors:      Hypertension, hyperlipidemia, Diabetes, past history of                    smoking. Comparison Study:  Previous carotid duplex performed 06/11/2012 showed RICA                    velocities of 105/14 cm/sec and LICA velocities of 89/21                    cm/sec. Study was listed as technically challenging. Performing Technologist: Harless Lien RVT  Examination Guidelines: A complete evaluation includes B-mode imaging, spectral Doppler, color Doppler, and power Doppler as needed of all accessible portions of each vessel. Bilateral testing is considered an integral part of a complete examination. Limited examinations for reoccurring indications may be performed as noted.  Right Carotid Findings: +----------+--------+--------+--------+------------------+--------+           PSV cm/sEDV cm/sStenosisPlaque DescriptionComments +----------+--------+--------+--------+------------------+--------+ CCA Prox  151     15                                         +----------+--------+--------+--------+------------------+--------+ CCA Distal83      14                                         +----------+--------+--------+--------+------------------+--------+ ICA Prox  95      12              heterogenous               +----------+--------+--------+--------+------------------+--------+ ICA Mid   120     28      1-39%   heterogenous               +----------+--------+--------+--------+------------------+--------+ ICA Distal124     29                                         +----------+--------+--------+--------+------------------+--------+ ECA       103     16                                         +----------+--------+--------+--------+------------------+--------+ +----------+--------+-------+----------------+-------------------+           PSV cm/sEDV cmsDescribe        Arm Pressure (mmHG)  +----------+--------+-------+----------------+-------------------+ Subclavian142     0      Multiphasic, [id number]                 +----------+--------+-------+----------------+-------------------+ +---------+--------+--------+--------------+ VertebralPSV cm/sEDV cm/sNot identified +---------+--------+--------+--------------+  Left Carotid Findings: +----------+--------+--------+--------+--------------------+--------+           PSV cm/sEDV cm/sStenosisPlaque Description  Comments +----------+--------+--------+--------+--------------------+--------+ CCA Prox  105     18                                           +----------+--------+--------+--------+--------------------+--------+ CCA Distal68      10                                           +----------+--------+--------+--------+--------------------+--------+  ICA Prox  83      14      1-39%   diffuse and calcific         +----------+--------+--------+--------+--------------------+--------+ ICA Mid   67      14                                           +----------+--------+--------+--------+--------------------+--------+ ICA Distal71      22                                           +----------+--------+--------+--------+--------------------+--------+ ECA       122     6                                            +----------+--------+--------+--------+--------------------+--------+ +----------+--------+--------+----------------+-------------------+           PSV cm/sEDV cm/sDescribe        Arm Pressure (mmHG) +----------+--------+--------+----------------+-------------------+ Subclavian132     0       Multiphasic, [id number]                 +----------+--------+--------+----------------+-------------------+ +---------+--------+--+--------+--+---------+ VertebralPSV cm/s40EDV cm/s17Antegrade +---------+--------+--+--------+--+---------+   Summary: Right Carotid: Velocities in the right ICA are  consistent with a 1-39% stenosis. Left Carotid: Velocities in the left ICA are consistent with a 1-39% stenosis. Vertebrals:  Left vertebral artery demonstrates antegrade flow. Right vertebral              artery demonstrates no discernable flow. Subclavians: Normal flow hemodynamics were seen in bilateral subclavian              arteries. *See table(s) above for measurements and observations.  Electronically signed by Armida Lander MD on 04/05/2022 at 2:17:12 PM.    Final     Assessment & Plan:  Hyperlipidemia with target LDL less than 100 -     Hepatic function panel; Future -     Lipid panel; Future  Type 2 diabetes mellitus with diabetic nephropathy, without long-term current use of insulin (HCC) -     Microalbumin / creatinine urine ratio; Future -     Urinalysis, Routine w reflex microscopic; Future -     Hepatic function panel; Future -     Hemoglobin A1c; Future -     Basic metabolic panel with GFR; Future -     Losartan  Potassium-HCTZ; Take 1 tablet by mouth daily.  Dispense: 90 tablet; Refill: 0 -     Ozempic (0.25 or 0.5 MG/DOSE); Inject 0.25 mg into the skin once a week.  Dispense: 3 mL; Refill: 0 -     Insulin Pen Needle; 1 Act by Does not apply route once a week.  Dispense: 100 each; Refill: 0 -     FreeStyle Libre 3 Plus Sensor; Apply 1 Act topically every 14 (fourteen) days. Change sensor every 15 days.  Dispense: 6 each; Refill: 1  Essential hypertension -     Microalbumin / creatinine urine ratio; Future -     Urinalysis, Routine w reflex microscopic; Future -     Hepatic function panel; Future -     CBC with Differential/Platelet; Future -     Basic  metabolic panel with GFR; Future -     EKG 12-Lead -     Losartan  Potassium-HCTZ; Take 1 tablet by mouth daily.  Dispense: 90 tablet; Refill: 0  Pure hyperglyceridemia -     Lipid panel; Future  Chronic painful diabetic neuropathy (HCC)  Balanitis -     Nystatin ; Apply 1 Application topically 2 (two) times daily.   Dispense: 30 g; Refill: 3  Idiopathic chronic gout of right foot without tophus -     Uric acid; Future  OSA (obstructive sleep apnea) -     Pulmonary Visit  Abnormal EKG -     ECHOCARDIOGRAM COMPLETE; Future  Idiopathic chronic gout of multiple sites without tophus -     Allopurinol ; Take 1 tablet (100 mg total) by mouth daily.  Dispense: 90 tablet; Refill: 0     Follow-up: Return in about 6 months (around 03/26/2024).  Sandra Crouch, MD

## 2023-09-24 NOTE — Patient Instructions (Signed)

## 2023-10-02 NOTE — Progress Notes (Signed)
 From Dr DeDios 2017: patient was diagnosed with severe OSA based on PSG done in 2002. AHI was 47. He had a rpt PAP study in 08/2010 which showed he was good on BiPaP 19/15.  He used cpap x 3 mos in 2002.  He was non compliant so DME got CPAP.  He could not tolerate cpap. He was claustrophobic.  He tried an oral device but once his back teeth were pulled, oral device was deemed not working.   10/04/23- 88 yoM for sleep evaluation courtesy of Dr Sandra Crouch with concern of OSA Medical problem list includes HTN, DM/ Neuropathy, OA, BPH/Nocturia, Gout,  Sleep Consult-snoring, wakes up feeling tired Discussed the use of AI scribe software for clinical note transcription with the patient, who gave verbal consent to proceed. He has  been off CPAP for years, but symptoms are obtrusive and he asks re-evaluation. Lives alone. History of Present Illness   Matthew Villanueva is an 88 year old male with sleep apnea who presents with worsening symptoms and fatigue.  He experiences significant fatigue and wakes up feeling very tired in the morning. A sleep study in 2017 showed moderately severe sleep apnea with 26 apneic episodes per hour. He has tried various treatments without success and does not use any sleep medication.  He consumes three to four glasses of iced tea and a cup of coffee daily. He experiences nocturia, waking three to four times per night, but can return to sleep. 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  He sleeps alone and denies unusual sleep behaviors such as kicking, punching, or sleepwalking. He does not find himself in different parts of the house upon waking. He has a couple of dental implants, but otherwise teeth are his own. History of cardiac ablation for tachycardia and EKG apparently suggested possible old MI. He is followed by cardiology.   Prior to Admission  medications   Medication Sig Start Date End Date Taking? Authorizing Provider  allopurinol  (ZYLOPRIM ) 100 MG tablet Take 1 tablet (100 mg total) by mouth daily. Patient taking differently: Take 100 mg by mouth daily. As needed 09/24/23  Yes Arcadio Knuckles, MD  aspirin EC 81 MG tablet Take 81 mg by mouth daily. Swallow whole.   Yes [provider]  blood glucose meter kit and supplies KIT Use to test blood sugar up to twice daily. DX: E11.21 06/14/20  Yes Arcadio Knuckles, MD  colchicine  0.6 MG tablet Take 1 tablet (0.6 mg total) by mouth daily. Patient taking differently: Take 0.6 mg by mouth daily. As needed 06/02/22  Yes Roslyn Coombe, MD  Continuous Glucose Sensor (FREESTYLE LIBRE 3 PLUS SENSOR) MISC Apply 1 Act topically every 14 (fourteen) days. Change sensor every 15 days. 09/24/23  Yes Arcadio Knuckles, MD  glucose blood (COOL BLOOD GLUCOSE TEST STRIPS) test strip Use to test blood sugar up to twice daily. DX: E11.21 06/01/17  Yes Arcadio Knuckles, MD  Insulin  Pen Needle 32G X 6 MM MISC 1 Act by Does not apply route once a week. 09/24/23  Yes Arcadio Knuckles, MD  Lancets [facility] ULTRASOFT) lancets Use to test blood sugar up to twice daily. DX: E11.21 02/12/23  Yes Arcadio Knuckles, MD  losartan -hydrochlorothiazide (HYZAAR) 100-25 MG tablet Take 1 tablet by mouth daily. 09/24/23  Yes Arcadio Knuckles, MD  Multiple Vitamin (MULTIVITAMIN) tablet Take 1  tablet by mouth daily.   Yes [provider]  nystatin  cream (MYCOSTATIN ) Apply 1 Application topically 2 (two) times daily. 09/24/23  Yes Arcadio Knuckles, MD  ONETOUCH VERIO test strip USE TO TEST BLOOD SUGAR TWO TIMES A DAY 09/25/23  Yes Arcadio Knuckles, MD  Semaglutide ,0.25 or 0.5MG /DOS, (OZEMPIC , 0.25 OR 0.5 MG/DOSE,) 2 MG/3ML SOPN Inject 0.25 mg into the skin once a week. 09/24/23  Yes Arcadio Knuckles, MD   Past Medical History:  Diagnosis Date   Arthritis    Diabetes mellitus    NO INSULIN    Dyspnea    Gout    History of colonic  polyps    HTN (hypertension)    Localized osteoarthrosis not specified whether primary or secondary, hand    OSA (obstructive sleep apnea)    Other specified cardiac dysrhythmias(427.89) 2004   ABLATION DUE TO TACHYCARDIA   Right bundle branch block    Past Surgical History:  Procedure Laterality Date   CARDIAC ELECTROPHYSIOLOGY STUDY AND ABLATION  2004   ablation for SVT with a chronic right bundle branch block    HEMORRHOID SURGERY  1960   PILONIDAL CYST DRAINAGE     Breast Cancer-related family history is not on file. Social History   Socioeconomic History   Marital status: Single    Spouse name: Not on file   Number of children: Not on file   Years of education: 16   Highest education level: Not on file  Occupational History   Occupation: self employed, Manufacturing systems engineer: SELF-EMPLOYED  Tobacco Use   Smoking status: Former    Current packs/day: 0.00    Average packs/day: 2.0 packs/day for 30.0 years (60.0 ttl pk-yrs)    Types: Cigarettes    Start date: 05/15/1954    Quit date: 05/15/1984    Years since quitting: 39.4   Smokeless tobacco: Never  Vaping Use   Vaping status: Never Used  Substance and Sexual Activity   Alcohol use: Yes    Comment: Rarely   Drug use: No   Sexual activity: Not Currently  Other Topics Concern   Not on file  Social History Narrative   HS[place], [place]   Single   Work: self employed...Nurse, children's   I-ADLs   End-of-Life: Yes-CPR, Yes-short-term Mechanical Ventilation, but no prolonged ventilation. Is willing to undergo dialysis, prolonged tub feeding.   Pt states former smoker. 1986. 2ppd. Started age 14   Social Drivers of Health   Financial Resource Strain: Low Risk  (11/30/2022)   Overall Financial Resource Strain (CARDIA)    Difficulty of Paying Living Expenses: Not very hard  Food Insecurity: No Food Insecurity (11/30/2022)   Hunger Vital Sign    Worried About Running Out of Food in the Last Year: Never true    Ran  Out of Food in the Last Year: Never true  Transportation Needs: No Transportation Needs (11/30/2022)   PRAPARE - Administrator, Civil Service (Medical): No    Lack of Transportation (Non-Medical): No  Physical Activity: Unknown (11/30/2022)   Exercise Vital Sign    Days of Exercise per Week: 0 days    Minutes of Exercise per Session: Not on file  Stress: No Stress Concern Present (11/30/2022)   [employer] of Occupational Health - Occupational Stress Questionnaire    Feeling of Stress : Not at all  Social Connections: Unknown (11/30/2022)   Social Connection and Isolation Panel [NHANES]    Frequency of Communication  with Friends and Family: Once a week    Frequency of Social Gatherings with Friends and Family: More than three times a week    Attends Religious Services: Never    Database administrator or Organizations: No    Attends Banker Meetings: Never    Marital Status: Patient declined  Catering manager Violence: Not At Risk (11/30/2022)   Humiliation, Afraid, Rape, and Kick questionnaire    Fear of Current or Ex-Partner: No    Emotionally Abused: No    Physically Abused: No    Sexually Abused: No   Assessment and Plan:    Obstructive Sleep Apnea Chronic moderate obstructive sleep apnea with excessive daytime sleepiness and nocturnal awakenings. Discussed CPAP and Inspire therapy options. - Order home sleep study to reassess severity. - Consider CPAP if severity unchanged. - Discuss ENT referral for Inspire if CPAP intolerant.  Enlarged Prostate Enlarged prostate contributing to nocturia, exacerbating sleep disturbances.  Tachycardia and Ablation Tachycardia previously treated with ablation. Recent EKG irregularities noted. - Follow-up with cardiologist on June 24th.     ROS-see HPI   + = positive Constitutional:    weight loss, night sweats, fevers, chills, fatigue, lassitude. HEENT:    headaches, difficulty swallowing, tooth/dental  problems, sore throat,       +sneezing, itching, ear ache, +nasal congestion, post nasal drip, snoring CV:    chest pain, orthopnea, PND, swelling in lower extremities, anasarca,                                   dizziness, palpitations Resp:   +shortness of breath with exertion or at rest.                productive cough,   non-productive cough, coughing up of blood.              change in color of mucus.  wheezing.   Skin:    rash or lesions. GI:  No-   heartburn, indigestion, abdominal pain, nausea, vomiting, diarrhea,                 change in bowel habits, loss of appetite GU: dysuria, change in color of urine, no urgency or frequency.   flank pain. MS:   joint pain, +stiffness, decreased range of motion, back pain. Neuro-     nothing unusual Psych:  change in mood or affect.  depression or anxiety.   memory loss.  OBJ- Physical Exam General- Alert, Oriented, Affect-appropriate, Distress- none acute Skin- rash-none, lesions- none, excoriation- none Lymphadenopathy- none Head- atraumatic            Eyes- Gross vision intact, PERRLA, conjunctivae and secretions clear            Ears- Hearing, canals-normal            Nose- Clear, no-Septal dev, mucus, polyps, erosion, perforation             Throat- Mallampati II , mucosa clear , drainage- none, tonsils- atrophic Neck- flexible , trachea midline, no stridor , thyroid  nl, carotid no bruit Chest - symmetrical excursion , unlabored           Heart/CV- RRR , no murmur , no gallop  , no rub, nl s1 s2                           -  JVD- none , edema- none, stasis changes- none, varices- none           Lung- clear to P&A, wheeze- none, cough- none , dullness-none, rub- none           Chest wall-  Abd-  Br/ Gen/ Rectal- Not done, not indicated Extrem- cyanosis- none, clubbing, none, atrophy- none, strength- nl Neuro- grossly intact to observation

## 2023-10-03 ENCOUNTER — Telehealth: Payer: Self-pay | Admitting: *Deleted

## 2023-10-03 NOTE — Progress Notes (Signed)
 Care Guide Pharmacy Note  10/03/2023 Name: Matthew Villanueva MRN: [id number] DOB: 09-Jan-1936  Referred By: Arcadio Knuckles, MD Reason for referral: Complex Care Management   Matthew Villanueva is a 88 y.o. year old male who is a primary care patient of Arcadio Knuckles, MD.  Matthew Villanueva was referred to the pharmacist for assistance related to: DMII  Successful contact was made with the patient to discuss pharmacy services including being ready for the pharmacist to call at least 5 minutes before the scheduled appointment time and to have medication bottles and any blood pressure readings ready for review. The patient agreed to meet with the pharmacist via telephone visit on 10/16/2023  Matthew Villanueva, CMA [place]  [facility], [facility] Guide Direct Dial: [phone]  Fax: [phone] Website: [email or web address]

## 2023-10-04 ENCOUNTER — Encounter: Payer: Self-pay | Admitting: Internal Medicine

## 2023-10-04 ENCOUNTER — Telehealth: Payer: Self-pay

## 2023-10-04 ENCOUNTER — Ambulatory Visit: Admitting: Internal Medicine

## 2023-10-04 VITALS — BP 122/64 | HR 63 | Temp 97.8°F | Ht 70.0 in | Wt 239.8 lb

## 2023-10-04 DIAGNOSIS — Z87891 Personal history of nicotine dependence: Secondary | ICD-10-CM | POA: Diagnosis not present

## 2023-10-04 DIAGNOSIS — R0683 Snoring: Secondary | ICD-10-CM | POA: Diagnosis not present

## 2023-10-04 NOTE — Telephone Encounter (Signed)
 Spoke with patient, his Echo is awaiting a PA reached out to referrals group to handle that. Advised not to restart jardiance , patient gave verbal understanding

## 2023-10-04 NOTE — Patient Instructions (Signed)
 Order- schedule home sleep test   dx OSA  Please call me about 2 weeks after your test for results and recommendations

## 2023-10-04 NOTE — Telephone Encounter (Signed)
 Copied from CRM [id number]. Topic: General - Other >> Oct 04, 2023 10:10 AM Marissa P wrote: Reason for CRM: Patient called to see if he should get scheduled for his echo cardiogram and if he should stop taking the med: Nila Barth permanently or is that just temporary please advise.

## 2023-10-05 ENCOUNTER — Encounter (HOSPITAL_COMMUNITY): Payer: Self-pay

## 2023-10-05 ENCOUNTER — Ambulatory Visit (HOSPITAL_BASED_OUTPATIENT_CLINIC_OR_DEPARTMENT_OTHER)

## 2023-10-05 DIAGNOSIS — R9431 Abnormal electrocardiogram [ECG] [EKG]: Secondary | ICD-10-CM

## 2023-10-05 LAB — ECHOCARDIOGRAM COMPLETE
AR max vel: 3.88 cm2
AV Area VTI: 3.67 cm2
AV Area mean vel: 4.12 cm2
AV Mean grad: 3 mmHg
AV Peak grad: 7.2 mmHg
Ao pk vel: 1.34 m/s
Area-P 1/2: 2.44 cm2
S' Lateral: 2.7 cm

## 2023-10-05 MED ORDER — PERFLUTREN LIPID MICROSPHERE
1.0000 mL | INTRAVENOUS | Status: AC | PRN
  Administered 2023-10-05: 5 mL via INTRAVENOUS

## 2023-10-09 ENCOUNTER — Ambulatory Visit: Payer: Self-pay | Admitting: Internal Medicine

## 2023-10-16 ENCOUNTER — Other Ambulatory Visit (INDEPENDENT_AMBULATORY_CARE_PROVIDER_SITE_OTHER): Admitting: Pharmacist

## 2023-10-16 ENCOUNTER — Telehealth: Payer: Self-pay

## 2023-10-16 DIAGNOSIS — E1121 Type 2 diabetes mellitus with diabetic nephropathy: Secondary | ICD-10-CM

## 2023-10-16 MED ORDER — OZEMPIC (0.25 OR 0.5 MG/DOSE) 2 MG/3ML ~~LOC~~ SOPN: 0.5000 mg | PEN_INJECTOR | SUBCUTANEOUS | 0 refills | Status: DC

## 2023-10-16 NOTE — Telephone Encounter (Signed)
 Received a fax from [employer] and have fax it to [employer] to switch from Rybelsis to Ozempic  will follow up in a few days.

## 2023-10-16 NOTE — Patient Instructions (Signed)
 It was a pleasure speaking with you today!  Increase Ozempic  to 0.5 mg weekly after completing 4 weeks on the 0.25 mg.  I will request Ozempic  to be sent from the [facility] patient assistance program.  Feel free to call with any questions or concerns!  Rainelle Bur, PharmD, BCPS, CPP Clinical Pharmacist Practitioner [facility] at [facility] [phone]

## 2023-10-16 NOTE — Progress Notes (Signed)
 10/16/2023 Name: Matthew Villanueva MRN: [id number] DOB: May 13, 1936  Chief Complaint  Patient presents with   Diabetes   Medication Management    Dheeraj Hail is a 88 y.o. year old male who presented for a telephone visit.   They were referred to the pharmacist by their PCP for assistance in managing diabetes.    Subjective:  Care Team: Primary Care Provider: Arcadio Knuckles, MD ; Next Scheduled Visit: none scheduled  Medication Access/Adherence  Current Pharmacy:  [facility] [id number] [place], [place] - [address] [place] [address] Phone: [phone] Fax: [phone]   Patient reports affordability concerns with their medications: Yes  Patient reports access/transportation concerns to their pharmacy: No  Patient reports adherence concerns with their medications:  Yes    Ozempic  is $245 per month supply  Diabetes:  Current medications: Rybelsus  14 mg daily, Ozempic  0.25 mg weekly (has been taking for 3-4 weeks)  Pt did stop Jardiance  - issues with recurrent yeast infection  Pt notes his BG have increased and he has gained weight since stopping Jardiance  and starting Ozempic  0.25 mg  Current medication access support: Rybelsus  PAP    Objective:  Lab Results  Component Value Date   HGBA1C 7.3 (H) 09/24/2023    Lab Results  Component Value Date   CREATININE 1.12 09/24/2023   BUN 19 09/24/2023   NA 137 09/24/2023   K 4.3 09/24/2023   CL 100 09/24/2023   CO2 29 09/24/2023    Lab Results  Component Value Date   CHOL 171 09/24/2023   HDL 40.50 09/24/2023   LDLCALC 61 09/24/2023   LDLDIRECT 81.0 01/05/2022   TRIG 346.0 (H) 09/24/2023   CHOLHDL 4 09/24/2023    Medications Reviewed Today     Reviewed by Dion Frankel, RPH (Pharmacist) on 10/16/23 at 1024  Med List Status: <None>   Medication Order Taking? Sig Documenting Provider Last Dose Status Informant  allopurinol  (ZYLOPRIM ) 100 MG  tablet [id number]  Take 1 tablet (100 mg total) by mouth daily.  Patient taking differently: Take 100 mg by mouth daily. As needed   Arcadio Knuckles, MD  Active   aspirin EC 81 MG tablet [id number]  Take 81 mg by mouth daily. Swallow whole. [provider]  Active   blood glucose meter kit and supplies KIT [id number]  Use to test blood sugar up to twice daily. DX: E11.21 Arcadio Knuckles, MD  Active   colchicine  0.6 MG tablet [id number]  Take 1 tablet (0.6 mg total) by mouth daily.  Patient taking differently: Take 0.6 mg by mouth daily. As needed   Roslyn Coombe, MD  Active   Continuous Glucose Sensor (FREESTYLE LIBRE 3 PLUS SENSOR) [place] [id number]  Apply 1 Act topically every 14 (fourteen) days. Change sensor every 15 days. Arcadio Knuckles, MD  Active   glucose blood (COOL BLOOD GLUCOSE TEST STRIPS) test strip [id number]  Use to test blood sugar up to twice daily. DX: E11.21 Arcadio Knuckles, MD  Active   Insulin  Pen Needle 32G X 6 MM MISC [id number]  1 Act by Does not apply route once a week. Arcadio Knuckles, MD  Active   Lancets [facility] ULTRASOFT) lancets [id number]  Use to test blood sugar up to twice daily. DX: E11.21 Arcadio Knuckles, MD  Active   losartan -hydrochlorothiazide (HYZAAR) 100-25 MG tablet [id number]  Take 1 tablet by mouth daily. Arcadio Knuckles, MD  Active  Multiple Vitamin (MULTIVITAMIN) tablet [id number]  Take 1 tablet by mouth daily. [provider]  Active   nystatin  cream (MYCOSTATIN ) [id number]  Apply 1 Application topically 2 (two) times daily. Arcadio Knuckles, MD  Active   [facility] VERIO test strip [id number]  USE TO TEST BLOOD SUGAR TWO TIMES A DAY Arcadio Knuckles, MD  Active   Semaglutide ,0.25 or 0.5MG /DOS, (OZEMPIC , 0.25 OR 0.5 MG/DOSE,) 2 MG/3ML SOPN [id number] Yes Inject 0.25 mg into the skin once a week. Arcadio Knuckles, MD Taking Active               Assessment/Plan:   Diabetes: - Currently controlled, A1c goal <8% - Advised to STOP Rybelsus  -  Will send change form to [employer] PAP for pt to get Ozempic  through PAP - Increase Ozempic  to 0.5 mg weekly after 4 weeks of 0.25 mg - Counseled on side effects to monitor for after dose increase    Follow Up Plan: F/u on PAP in a few weeks   Rainelle Bur, PharmD, BCPS Clinical Pharmacist [facility] [phone]

## 2023-10-17 ENCOUNTER — Encounter

## 2023-10-17 DIAGNOSIS — G4730 Sleep apnea, unspecified: Secondary | ICD-10-CM | POA: Diagnosis not present

## 2023-10-17 DIAGNOSIS — R0683 Snoring: Secondary | ICD-10-CM

## 2023-10-22 ENCOUNTER — Other Ambulatory Visit: Payer: Self-pay | Admitting: Internal Medicine

## 2023-10-22 DIAGNOSIS — E1121 Type 2 diabetes mellitus with diabetic nephropathy: Secondary | ICD-10-CM

## 2023-10-23 NOTE — Telephone Encounter (Signed)
 Call [employer] to follow up on PAP Ozempic  representative said pt has been APPROVED thru 05/14/2024 left a msg at pt number.

## 2023-11-05 NOTE — Telephone Encounter (Unsigned)
 Copied from CRM [id number]. Topic: Clinical - Medication Question >> Nov 05, 2023  1:18 PM Suzen RAMAN wrote: Reason for CRM: Patient would like a call back pertaining to Semaglutide ,0.25 or 0.5MG /DOS, (OZEMPIC , 0.25 OR 0.5 MG/DOSE,) 2 MG/3ML SOPN and when medication will be available for pickup up at the office CB# [phone]

## 2023-11-06 ENCOUNTER — Ambulatory Visit: Attending: Nurse Practitioner | Admitting: Nurse Practitioner

## 2023-11-06 ENCOUNTER — Encounter: Payer: Self-pay | Admitting: Nurse Practitioner

## 2023-11-06 ENCOUNTER — Telehealth: Payer: Self-pay

## 2023-11-06 VITALS — BP 126/62 | HR 77 | Ht 70.0 in | Wt 242.0 lb

## 2023-11-06 DIAGNOSIS — I4510 Unspecified right bundle-branch block: Secondary | ICD-10-CM | POA: Diagnosis not present

## 2023-11-06 DIAGNOSIS — I471 Supraventricular tachycardia, unspecified: Secondary | ICD-10-CM | POA: Diagnosis not present

## 2023-11-06 DIAGNOSIS — I10 Essential (primary) hypertension: Secondary | ICD-10-CM

## 2023-11-06 DIAGNOSIS — I77810 Thoracic aortic ectasia: Secondary | ICD-10-CM | POA: Diagnosis not present

## 2023-11-06 DIAGNOSIS — G4733 Obstructive sleep apnea (adult) (pediatric): Secondary | ICD-10-CM

## 2023-11-06 DIAGNOSIS — E119 Type 2 diabetes mellitus without complications: Secondary | ICD-10-CM | POA: Diagnosis not present

## 2023-11-06 DIAGNOSIS — I6523 Occlusion and stenosis of bilateral carotid arteries: Secondary | ICD-10-CM

## 2023-11-06 DIAGNOSIS — E785 Hyperlipidemia, unspecified: Secondary | ICD-10-CM | POA: Diagnosis not present

## 2023-11-06 NOTE — Patient Instructions (Signed)
 Medication Instructions:  Your physician recommends that you continue on your current medications as directed. Please refer to the Current Medication list given to you today.  *If you need a refill on your cardiac medications before your next appointment, please call your pharmacy*  Lab Work: NONE ordered at this time of appointment   Testing/Procedures: Your physician has requested that you have a carotid duplex. This test is an ultrasound of the carotid arteries in your neck. It looks at blood flow through these arteries that supply the brain with blood. Allow one hour for this exam. There are no restrictions or special instructions.   Follow-Up: At [facility], you and your health needs are our priority.  As part of our continuing mission to provide you with exceptional heart care, our providers are all part of one team.  This team includes your primary Cardiologist (physician) and Advanced Practice Providers or APPs (Physician Assistants and Nurse Practitioners) who all work together to provide you with the care you need, when you need it.  Your next appointment:   1 year(s)  Provider:   Maude Emmer, MD    We recommend signing up for the patient portal called MyChart.  Sign up information is provided on this After Visit Summary.  MyChart is used to connect with patients for Virtual Visits (Telemedicine).  Patients are able to view lab/test results, encounter notes, upcoming appointments, etc.  Non-urgent messages can be sent to your provider as well.   To learn more about what you can do with MyChart, go to [email or web address].

## 2023-11-06 NOTE — Telephone Encounter (Signed)
 Patients Ozempic  medication has arrived and placed in the fridge. I was unable to reach the patient but I did leave a very detailed message for him to come and pick it up at his earliest convenience

## 2023-11-06 NOTE — Progress Notes (Signed)
 Office Visit    Patient Name: Matthew Villanueva Date of Encounter: 11/06/2023  Primary Care Provider:  Joshua Debby CROME, MD Primary Cardiologist:  Maude Emmer, MD  Chief Complaint   88 year old male with a history of SVT s/p ablation, RBBB, mild dilation of ascending aorta, carotid artery stenosis, TIA, hypertension, hyperlipidemia, type 2 diabetes, OSA, and gout who presents for follow-up related to SVT.  Past Medical History    Past Medical History:  Diagnosis Date   Arthritis    Diabetes mellitus    NO INSULIN    Dyspnea    Gout    History of colonic polyps    HTN (hypertension)    Localized osteoarthrosis not specified whether primary or secondary, hand    OSA (obstructive sleep apnea)    Other specified cardiac dysrhythmias(427.89) 2004   ABLATION DUE TO TACHYCARDIA   Right bundle branch block    Past Surgical History:  Procedure Laterality Date   CARDIAC ELECTROPHYSIOLOGY STUDY AND ABLATION  2004   ablation for SVT with a chronic right bundle branch block    HEMORRHOID SURGERY  1960   PILONIDAL CYST DRAINAGE      Allergies  Allergies  Allergen Reactions   Livalo  [Pitavastatin ] Other (See Comments)    Muscle aches   Crestor  [Rosuvastatin ]     Muscle aches   Metformin  And Related Diarrhea   Zetia  [Ezetimibe ] Diarrhea   Pravastatin      Muscle aches     Labs/Other Studies Reviewed    The following studies were reviewed today:  Cardiac Studies & Procedures   ______________________________________________________________________________________________   STRESS TESTS  MYOCARDIAL PERFUSION IMAGING 03/16/2016  Narrative  Nuclear stress EF: 54%.  There was no ST segment deviation noted during stress.  The study is normal.  This is a low risk study.  Low risk stress nuclear study with normal perfusion and normal left ventricular regional and global systolic function.   ECHOCARDIOGRAM  ECHOCARDIOGRAM COMPLETE  10/05/2023  Narrative ECHOCARDIOGRAM REPORT    Patient Name:   Matthew Villanueva Date of Exam: 10/05/2023 Medical Rec #:  [id number]            Height:       70.0 in Accession #:    [id number]           Weight:       239.8 lb Date of Birth:  [birth year]           BSA:          2.255 m Patient Age:    87 years             BP:           122/64 mmHg Patient Gender: M                    HR:           66 bpm. Exam Location:  Outpatient  Procedure: 2D Echo, Cardiac Doppler, Color Doppler and Intracardiac Opacification Agent (Both Spectral and Color Flow Doppler were utilized during procedure).  Indications:    R06.9 DOE; R94.31 Abnormal EKG  History:        Patient has prior history of Echocardiogram examinations, most recent 03/27/2016. Abnormal ECG, Arrythmias:RBBB, Signs/Symptoms:Dyspnea; Risk Factors:Family History of Coronary Artery Disease, Sleep Apnea, Diabetes, Dyslipidemia and Former Smoker. Patient denies chest pain and leg edema. He does have DOE with a recent abnormal (new changes) EKG.  Sonographer:    Annabella Cater RVT, RDCS (AE),  RDMS Referring Phys: [id number] DEBBY LITTIE MOLT   Sonographer Comments: Suboptimal apical window and patient is obese. Image acquisition challenging due to patient body habitus. IMPRESSIONS   1. Left ventricular ejection fraction, by estimation, is 60 to 65%. The left ventricle has normal function. The left ventricle has no regional wall motion abnormalities. There is mild concentric left ventricular hypertrophy. Left ventricular diastolic parameters are consistent with Grade I diastolic dysfunction (impaired relaxation). Elevated left atrial pressure. The E/e' is 13.2. 2. Right ventricular systolic function is normal. The right ventricular size is normal. There is normal pulmonary artery systolic pressure. The estimated right ventricular systolic pressure is 26.3 mmHg. 3. The mitral valve is normal in structure. No evidence of mitral valve  regurgitation. No evidence of mitral stenosis. 4. The aortic valve is tricuspid. Aortic valve regurgitation is not visualized. No aortic stenosis is present. 5. Aortic dilatation noted. There is mild dilatation of the aortic root, measuring 40 mm. 6. The inferior vena cava is dilated in size with >50% respiratory variability, suggesting right atrial pressure of 8 mmHg.  Comparison(s): EF 55%.  FINDINGS Left Ventricle: Left ventricular ejection fraction, by estimation, is 60 to 65%. The left ventricle has normal function. The left ventricle has no regional wall motion abnormalities. Definity  contrast agent was given IV to delineate the left ventricular endocardial borders. The left ventricular internal cavity size was normal in size. There is mild concentric left ventricular hypertrophy. Left ventricular diastolic parameters are consistent with Grade I diastolic dysfunction (impaired relaxation). Elevated left atrial pressure. The E/e' is 13.2.  Right Ventricle: The right ventricular size is normal. No increase in right ventricular wall thickness. Right ventricular systolic function is normal. There is normal pulmonary artery systolic pressure. The tricuspid regurgitant velocity is 2.14 m/s, and with an assumed right atrial pressure of 8 mmHg, the estimated right ventricular systolic pressure is 26.3 mmHg.  Left Atrium: Left atrial size was normal in size.  Right Atrium: Right atrial size was normal in size.  Pericardium: There is no evidence of pericardial effusion.  Mitral Valve: The mitral valve is normal in structure. No evidence of mitral valve regurgitation. No evidence of mitral valve stenosis.  Tricuspid Valve: The tricuspid valve is normal in structure. Tricuspid valve regurgitation is trivial. No evidence of tricuspid stenosis.  Aortic Valve: The aortic valve is tricuspid. Aortic valve regurgitation is not visualized. No aortic stenosis is present. Aortic valve mean gradient  measures 3.0 mmHg. Aortic valve peak gradient measures 7.2 mmHg. Aortic valve area, by VTI measures 3.67 cm.  Pulmonic Valve: The pulmonic valve was grossly normal. Pulmonic valve regurgitation is not visualized. No evidence of pulmonic stenosis.  Aorta: Aortic dilatation noted. There is mild dilatation of the aortic root, measuring 40 mm.  Venous: The inferior vena cava is dilated in size with greater than 50% respiratory variability, suggesting right atrial pressure of 8 mmHg.  IAS/Shunts: No atrial level shunt detected by color flow Doppler.   LEFT VENTRICLE PLAX 2D LVIDd:         4.30 cm   Diastology LVIDs:         2.70 cm   LV e' medial:    4.25 cm/s LV PW:         1.27 cm   LV E/e' medial:  17.4 LV IVS:        1.14 cm   LV e' lateral:   8.25 cm/s LVOT diam:     2.50 cm   LV E/e' lateral: 9.0  LV SV:         100 LV SV Index:   44 LVOT Area:     4.91 cm   RIGHT VENTRICLE RV S prime:     13.80 cm/s TAPSE (M-mode): 2.8 cm  LEFT ATRIUM           Index        RIGHT ATRIUM           Index LA Vol (A2C): 55.0 ml 24.41 ml/m  RA Area:     16.60 cm LA Vol (A4C): 57.3 ml 25.42 ml/m  RA Volume:   46.10 ml  20.45 ml/m AORTIC VALVE                    PULMONIC VALVE AV Area (Vmax):    3.88 cm     PV Vmax:          1.10 m/s AV Area (Vmean):   4.12 cm     PV Peak grad:     4.9 mmHg AV Area (VTI):     3.67 cm     PR End Diast Vel: 2.78 msec AV Vmax:           134.00 cm/s AV Vmean:          85.350 cm/s AV VTI:            0.272 m AV Peak Grad:      7.2 mmHg AV Mean Grad:      3.0 mmHg LVOT Vmax:         106.00 cm/s LVOT Vmean:        71.700 cm/s LVOT VTI:          0.204 m LVOT/AV VTI ratio: 0.75  AORTA Ao Root diam: 4.00 cm Ao Asc diam:  3.60 cm  MITRAL VALVE                TRICUSPID VALVE MV Area (PHT): 2.44 cm     TR Peak grad:   18.3 mmHg MV Decel Time: 311 msec     TR Vmax:        214.00 cm/s MV E velocity: 73.90 cm/s MV A velocity: 104.00 cm/s  SHUNTS MV E/A ratio:   0.71         Systemic VTI:  0.20 m Systemic Diam: 2.50 cm  Jerel Croitoru MD Electronically signed by Jerel Balding MD Signature Date/Time: 10/05/2023/4:13:52 PM    Final          ______________________________________________________________________________________________     Recent Labs: 03/14/2023: TSH 1.33 09/24/2023: ALT 22; BUN 19; Creatinine, Ser 1.12; Hemoglobin 17.3; Platelets 246.0; Potassium 4.3; Sodium 137  Recent Lipid Panel    Component Value Date/Time   CHOL 171 09/24/2023 1414   CHOL 124 06/05/2022 0810   TRIG 346.0 (H) 09/24/2023 1414   HDL 40.50 09/24/2023 1414   HDL 38 (L) 06/05/2022 0810   CHOLHDL 4 09/24/2023 1414   VLDL 69.2 (H) 09/24/2023 1414   LDLCALC 61 09/24/2023 1414   LDLCALC 59 06/05/2022 0810   LDLCALC 81 12/16/2019 1140   LDLDIRECT 81.0 01/05/2022 0956    History of Present Illness    88 year old male with the above past medical history including SVT s/p ablation, RBBB, mild dilation of ascending aorta, carotid artery stenosis, TIA, hypertension, hyperlipidemia, type 2 diabetes, OSA and gout.  He has a history of SVT s/p ablation in [place] in 2004, chronic RBBB.  Nuclear stress test in 2017 was low risk, EF 54%.  Echocardiogram in 2017 showed EF 55 to 60%, mild concentric LVH, G1 DD, mildly calcified aortic valve leaflets, mitral valve calcification.  He has a history of TIA, bilateral carotid artery stenosis (1 to 39% B ICA stenosis in 2014).  He was last seen in the office on 03/21/2022 and was doing well from a cardiac standpoint.  He denied palpitations.  Repeat carotid ultrasound in 03/2022 showed 1 to 39% B ICA stenosis.  He saw his PCP in 09/2023 who noted a possibly abnormal EKG.  Follow-up echocardiogram in May 2025 per PCP showed EF 60 to 65%, normal LV function, no RWMA, mild concentric LVH, G1 DD, normal RV systolic function, no significant valvular abnormalities, mild dilation of ascending aorta measuring 40 mm.  He presents  today for follow-up.  Since his last visit he has done well from a cardiac standpoint.  He reports occasional chest tightness when lying in bed at night, often after eating a snack.  Symptoms will last for a few minutes and resolve spontaneously.  He denies any exertional symptoms concerning for angina.  He denies palpitations, dizziness, presyncope or syncope.  He is following with pulmonology for management of sleep apnea.  Overall, he reports feeling well.  Home Medications    Current Outpatient Medications  Medication Sig Dispense Refill   allopurinol  (ZYLOPRIM ) 100 MG tablet Take 1 tablet (100 mg total) by mouth daily. 90 tablet 0   aspirin EC 81 MG tablet Take 81 mg by mouth daily. Swallow whole.     blood glucose meter kit and supplies KIT Use to test blood sugar up to twice daily. DX: E11.21 1 each 0   colchicine  0.6 MG tablet Take 1 tablet (0.6 mg total) by mouth daily. 90 tablet 0   Continuous Glucose Sensor (FREESTYLE LIBRE 3 PLUS SENSOR) MISC Apply 1 Act topically every 14 (fourteen) days. Change sensor every 15 days. 6 each 1   glucose blood (COOL BLOOD GLUCOSE TEST STRIPS) test strip Use to test blood sugar up to twice daily. DX: E11.21 100 each 12   Insulin  Pen Needle 32G X 6 MM MISC 1 Act by Does not apply route once a week. 100 each 0   Lancets (ONETOUCH ULTRASOFT) lancets Use to test blood sugar up to twice daily. DX: E11.21 100 each 12   losartan -hydrochlorothiazide (HYZAAR) 100-25 MG tablet Take 1 tablet by mouth daily. 90 tablet 0   Multiple Vitamin (MULTIVITAMIN) tablet Take 1 tablet by mouth daily.     ONETOUCH VERIO test strip USE TO TEST BLOOD SUGAR TWO TIMES A DAY 150 strip 5   Semaglutide ,0.25 or 0.5MG /DOS, (OZEMPIC , 0.25 OR 0.5 MG/DOSE,) 2 MG/3ML SOPN Inject 0.5 mg into the skin once a week. 3 mL 0   nystatin  cream (MYCOSTATIN ) Apply 1 Application topically 2 (two) times daily. (Patient not taking: Reported on 11/06/2023) 30 g 3   No current facility-administered  medications for this visit.     Review of Systems    He denies chest pain, palpitations, dyspnea, pnd, orthopnea, n, v, dizziness, syncope, edema, weight gain, or early satiety. All other systems reviewed and are otherwise negative except as noted above.   Physical Exam    VS:  BP 126/62 (BP Location: Left Arm, Patient Position: Sitting)   Pulse 77   Ht 5' 10 (1.778 m)   Wt 242 lb (109.8 kg)   SpO2 95%   BMI 34.72 kg/m   GEN: Well nourished, well developed, in no acute distress. HEENT: normal. Neck: Supple,  no JVD, carotid bruits, or masses. Cardiac: RRR, no murmurs, rubs, or gallops. No clubbing, cyanosis, edema.  Radials/DP/PT 2+ and equal bilaterally.  Respiratory:  Respirations regular and unlabored, clear to auscultation bilaterally. GI: Soft, nontender, nondistended, BS + x 4. MS: no deformity or atrophy. Skin: warm and dry, no rash. Neuro:  Strength and sensation are intact. Psych: Normal affect.  Accessory Clinical Findings    ECG personally reviewed by me today -    - no EKG in office today.    Lab Results  Component Value Date   WBC 9.1 09/24/2023   HGB 17.3 (H) 09/24/2023   HCT 51.6 09/24/2023   MCV 89.1 09/24/2023   PLT 246.0 09/24/2023   Lab Results  Component Value Date   CREATININE 1.12 09/24/2023   BUN 19 09/24/2023   NA 137 09/24/2023   K 4.3 09/24/2023   CL 100 09/24/2023   CO2 29 09/24/2023   Lab Results  Component Value Date   ALT 22 09/24/2023   AST 22 09/24/2023   ALKPHOS 21 (L) 09/24/2023   BILITOT 0.5 09/24/2023   Lab Results  Component Value Date   CHOL 171 09/24/2023   HDL 40.50 09/24/2023   LDLCALC 61 09/24/2023   LDLDIRECT 81.0 01/05/2022   TRIG 346.0 (H) 09/24/2023   CHOLHDL 4 09/24/2023    Lab Results  Component Value Date   HGBA1C 7.3 (H) 09/24/2023    Assessment & Plan    1. History of SVT/RBBB: S/p ablation in [place] in 2004, chronic RBBB. Nuclear stress test in 2017 was low risk, EF 54%. He saw his PCP in  09/2023 who noted a possibly abnormal EKG. EKG reviewed which shows right bundle branch block, nonspecific ST/T wave abnormality in the setting.  Follow-up echocardiogram in May 2025 per PCP showed EF 60 to 65%, normal LV function, no RWMA, mild concentric LVH, G1 DD, normal RV systolic function, no significant valvular abnormalities, mild dilation of ascending aorta measuring 40 mm. He reports occasional chest tightness when lying in bed at night, often after eating a snack. Symptoms will last for a few minutes and resolve spontaneously.  He denies any exertional symptoms concerning for angina.  Continue to monitor symptoms.  No indication for ischemic evaluation at this time.  Continue aspirin.  2. Mild dilation of ascending aorta: Echocardiogram in May 2025 showed EF 60 to 65%, normal LV function, no RWMA, mild concentric LVH, G1 DD, normal RV systolic function, no significant valvular abnormalities, mild dilation of ascending aorta measuring 40 mm.  Consider repeat imaging with echocardiogram versus CT in 1 year.  3. Carotid artery stenosis/history of TIA: Most recent carotid ultrasound in 03/2022 showed 1 to 39% B ICA stenosis.  He denies any significant dizziness, presyncope, syncope.  Will repeat carotid ultrasound for routine monitoring.  Continue aspirin.  4. Hypertension: BP well controlled. Continue current antihypertensive regimen.   5. Hyperlipidemia: LDL was 61 in 09/2023.  He is not on lipid-lowering therapy at this time.  6. Type 2 diabetes: A1c was 7.3 in 09/2023.  Monitored and managed per PCP.  7. OSA: Not on CPAP.  He just had a repeat sleep study, pending recommendations.  Followed by pulmonology.  8. Disposition: Follow-up in 1 year, sooner if needed.      Damien JAYSON Braver, NP 11/06/2023, 1:58 PM

## 2023-12-03 ENCOUNTER — Ambulatory Visit (INDEPENDENT_AMBULATORY_CARE_PROVIDER_SITE_OTHER)

## 2023-12-03 VITALS — Ht 70.0 in | Wt 239.0 lb

## 2023-12-03 DIAGNOSIS — Z0000 Encounter for general adult medical examination without abnormal findings: Secondary | ICD-10-CM | POA: Diagnosis not present

## 2023-12-03 NOTE — Progress Notes (Signed)
 Subjective:   Matthew Villanueva is a 88 y.o. who presents for a Medicare Wellness preventive visit.  As a reminder, Annual Wellness Visits don't include a physical exam, and some assessments may be limited, especially if this visit is performed virtually. We may recommend an in-person follow-up visit with your provider if needed.  Visit Complete: Virtual I connected with  Matthew Villanueva on 12/03/23 by a audio enabled telemedicine application and verified that I am speaking with the correct person using two identifiers.  Patient Location: Home  Provider Location: Home Office  I discussed the limitations of evaluation and management by telemedicine. The patient expressed understanding and agreed to proceed.  Vital Signs: Because this visit was a virtual/telehealth visit, some criteria may be missing or patient reported. Any vitals not documented were not able to be obtained and vitals that have been documented are patient reported.  VideoDeclined- This patient declined Librarian, academic. Therefore the visit was completed with audio only.  Persons Participating in Visit: Patient.  AWV Questionnaire: No: Patient Medicare AWV questionnaire was not completed prior to this visit.  Cardiac Risk Factors include: advanced age (>45men, >86 women);hypertension;male gender;diabetes mellitus;dyslipidemia;Other (see comment), Risk factor comments: BPH, OSA     Objective:    Today's Vitals   12/03/23 0811  Weight: 239 lb (108.4 kg)  Height: 5' 10 (1.778 m)   Body mass index is 34.29 kg/m.     12/03/2023    8:25 AM 11/30/2022   10:23 AM 12/15/2020    4:31 PM 12/04/2017   10:26 AM 11/29/2016   10:51 AM 03/06/2016   12:39 PM 11/11/2015    8:08 AM  Advanced Directives  Does Patient Have a Medical Advance Directive? Yes Yes Yes Yes  No;Yes  No  Yes   Type of Estate agent of [place];Living will Healthcare Power of [place];Living will  Living will;Healthcare Power of [employer] Power of [place];Living will   Healthcare Power of [place];Living will   Does patient want to make changes to medical advance directive?   No - Patient declined    No - Patient declined   Copy of Healthcare Power of Attorney in Chart? No - copy requested No - copy requested No - copy requested No - copy requested    No - copy requested   Would patient like information on creating a medical advance directive?     Yes (ED - Information included in AVS)        Data saved with a previous flowsheet row definition    Current Medications (verified) Outpatient Encounter Medications as of 12/03/2023  Medication Sig   allopurinol  (ZYLOPRIM ) 100 MG tablet Take 1 tablet (100 mg total) by mouth daily.   aspirin EC 81 MG tablet Take 81 mg by mouth daily. Swallow whole.   blood glucose meter kit and supplies KIT Use to test blood sugar up to twice daily. DX: E11.21   colchicine  0.6 MG tablet Take 1 tablet (0.6 mg total) by mouth daily.   Continuous Glucose Sensor (FREESTYLE LIBRE 3 PLUS SENSOR) MISC Apply 1 Act topically every 14 (fourteen) days. Change sensor every 15 days.   glucose blood (COOL BLOOD GLUCOSE TEST STRIPS) test strip Use to test blood sugar up to twice daily. DX: E11.21   Insulin  Pen Needle 32G X 6 MM MISC 1 Act by Does not apply route once a week.   Lancets (ONETOUCH ULTRASOFT) lancets Use to test blood sugar up to twice daily.  DX: E11.21   losartan -hydrochlorothiazide (HYZAAR) 100-25 MG tablet Take 1 tablet by mouth daily.   Multiple Vitamin (MULTIVITAMIN) tablet Take 1 tablet by mouth daily.   ONETOUCH VERIO test strip USE TO TEST BLOOD SUGAR TWO TIMES A DAY   Semaglutide ,0.25 or 0.5MG /DOS, (OZEMPIC , 0.25 OR 0.5 MG/DOSE,) 2 MG/3ML SOPN Inject 0.5 mg into the skin once a week.   nystatin  cream (MYCOSTATIN ) Apply 1 Application topically 2 (two) times daily. (Patient not taking: Reported on 12/03/2023)   No facility-administered encounter  medications on file as of 12/03/2023.    Allergies (verified) Livalo  [pitavastatin ], Crestor  [rosuvastatin ], Metformin  and related, Zetia  [ezetimibe ], and Pravastatin    History: Past Medical History:  Diagnosis Date   Arthritis    Diabetes mellitus    NO INSULIN    Dyspnea    Gout    History of colonic polyps    HTN (hypertension)    Localized osteoarthrosis not specified whether primary or secondary, hand    OSA (obstructive sleep apnea)    Other specified cardiac dysrhythmias(427.89) 2004   ABLATION DUE TO TACHYCARDIA   Right bundle branch block    Past Surgical History:  Procedure Laterality Date   CARDIAC ELECTROPHYSIOLOGY STUDY AND ABLATION  2004   ablation for SVT with a chronic right bundle branch block    HEMORRHOID SURGERY  1960   PILONIDAL CYST DRAINAGE     Family History  Problem Relation Age of Onset   Cancer Father        throat and tongue   Heart disease Mother    Lung cancer Sister    Diabetes Maternal Uncle    Heart attack Maternal Grandmother    Heart disease Maternal Grandmother        CAD/MI   Heart disease Maternal Grandfather    Heart disease Maternal Aunt        CAD   Social History   Socioeconomic History   Marital status: Single    Spouse name: Not on file   Number of children: Not on file   Years of education: 16   Highest education level: Not on file  Occupational History   Occupation: self employed, Manufacturing systems engineer: SELF-EMPLOYED   Occupation: RETIRED  Tobacco Use   Smoking status: Former    Current packs/day: 0.00    Average packs/day: 2.0 packs/day for 30.0 years (60.0 ttl pk-yrs)    Types: Cigarettes    Start date: 05/15/1954    Quit date: 05/15/1984    Years since quitting: 39.5   Smokeless tobacco: Never  Vaping Use   Vaping status: Never Used  Substance and Sexual Activity   Alcohol use: Yes    Comment: Rarely   Drug use: No   Sexual activity: Not Currently  Other Topics Concern   Not on file  Social History  Narrative   HS[place], [place]  StateSingleWork: self employed...sales fabricI-ADLsEnd-of-Life: Yes-CPR, Yes-short-term Mechanical Ventilation, but no prolonged ventilation. Is willing to undergo dialysis, prolonged tub feeding.Pt states former smoker. 1986. 2ppd. Started age 57      Lives alone/2025   Social Drivers of Health   Financial Resource Strain: Low Risk  (11/30/2022)   Overall Financial Resource Strain (CARDIA)    Difficulty of Paying Living Expenses: Not very hard  Food Insecurity: No Food Insecurity (11/30/2022)   Hunger Vital Sign    Worried About Running Out of Food in the Last Year: Never true    Ran Out of Food in the Last Year: Never true  Transportation Needs: No Transportation Needs (12/03/2023)   PRAPARE - Administrator, Civil Service (Medical): No    Lack of Transportation (Non-Medical): No  Physical Activity: Inactive (12/03/2023)   Exercise Vital Sign    Days of Exercise per Week: 0 days    Minutes of Exercise per Session: 0 min  Stress: No Stress Concern Present (12/03/2023)   [employer] of Occupational Health - Occupational Stress Questionnaire    Feeling of Stress: Only a little  Social Connections: Socially Isolated (12/03/2023)   Social Connection and Isolation Panel    Frequency of Communication with Friends and Family: More than three times a week    Frequency of Social Gatherings with Friends and Family: Three times a week    Attends Religious Services: Never    Active Member of Clubs or Organizations: No    Attends Banker Meetings: Never    Marital Status: Never married    Tobacco Counseling Counseling given: Not Answered    Clinical Intake:  Pre-visit preparation completed: Yes  Pain : No/denies pain     BMI - recorded: 34.29 Nutritional Status: BMI > 30  Obese Nutritional Risks: Nausea/ vomitting/ diarrhea (diarrhea) Diabetes: Yes CBG done?: Yes (140 per pt) CBG resulted in Enter/ Edit results?: No Did  pt. bring in CBG monitor from home?: No  Lab Results  Component Value Date   HGBA1C 7.3 (H) 09/24/2023   HGBA1C 7.6 (H) 03/14/2023   HGBA1C 7.7 (H) 07/27/2022     How often do you need to have someone help you when you read instructions, pamphlets, or other written materials from your doctor or pharmacy?: 1 - Never  Interpreter Needed?: No  Information entered by :: Khamarion Bjelland, RMA   Activities of Daily Living     12/03/2023    8:12 AM  In your present state of health, do you have any difficulty performing the following activities:  Hearing? 1  Comment Wears hearing aides  Vision? 0  Difficulty concentrating or making decisions? 0  Walking or climbing stairs? 0  Dressing or bathing? 0  Doing errands, shopping? 0  Preparing Food and eating ? N  Using the Toilet? N  In the past six months, have you accidently leaked urine? N  Do you have problems with loss of bowel control? N  Managing your Medications? N  Managing your Finances? N  Housekeeping or managing your Housekeeping? N    Patient Care Team: Joshua Debby CROME, MD as PCP - General (Internal Medicine) Delford Maude BROCKS, MD as PCP - Cardiology (Cardiology) Shellia Oh, MD (Inactive) (Pulmonary Disease) Merceda Lela SAUNDERS, [facility] (Pharmacist)  I have updated your Care Teams any recent Medical Services you may have received from other providers in the past year.     Assessment:   This is a routine wellness examination for Matthew Villanueva.  Hearing/Vision screen Hearing Screening - Comments:: Wears hearing aides Vision Screening - Comments:: Wears eyeglasses/    Goals Addressed   None    Depression Screen     12/03/2023    8:30 AM 09/24/2023    1:26 PM 04/27/2023   10:57 AM 04/23/2023    1:05 PM 02/12/2023   11:31 AM 11/30/2022   10:27 AM 06/02/2022    1:39 PM  PHQ 2/9 Scores  PHQ - 2 Score 0 0 4 0 0 0 0  PHQ- 9 Score 1 1 13   1      Fall Risk     12/03/2023  8:25 AM 10/04/2023    9:04 AM 09/24/2023    1:26 PM  04/23/2023    1:05 PM 02/12/2023   11:31 AM  Fall Risk   Falls in the past year? 0 0 0 0 0  Number falls in past yr: 0  0 0 0  Injury with Fall? 0  0 0 0  Risk for fall due to :   No Fall Risks No Fall Risks No Fall Risks  Follow up Falls evaluation completed;Falls prevention discussed  Falls evaluation completed Falls evaluation completed Falls evaluation completed    MEDICARE RISK AT HOME:  Medicare Risk at Home Any stairs in or around the home?: No If so, are there any without handrails?: No Home free of loose throw rugs in walkways, pet beds, electrical cords, etc?: Yes Adequate lighting in your home to reduce risk of falls?: Yes Life alert?: No Use of a cane, walker or w/c?: Yes (occasionally) Grab bars in the bathroom?: Yes Shower chair or bench in shower?: Yes Elevated toilet seat or a handicapped toilet?: Yes  TIMED UP AND GO:  Was the test performed?  No  Cognitive Function: 6CIT completed    12/04/2017   10:29 AM  MMSE - Mini Mental State Exam  Orientation to time 5  Orientation to Place 5  Registration 3  Attention/ Calculation 5  Recall 3  Language- name 2 objects 2  Language- repeat 1  Language- follow 3 step command 3  Language- read & follow direction 1  Write a sentence 1  Copy design 1  Total score 30        12/03/2023    8:26 AM 11/30/2022   10:40 AM  6CIT Screen  What Year? 0 points 0 points  What month? 0 points 0 points  What time? 0 points   Count back from 20 0 points 0 points  Months in reverse 0 points 0 points  Repeat phrase 0 points 0 points  Total Score 0 points     Immunizations Immunization History  Administered Date(s) Administered   Fluad Quad(high Dose 65+) 02/17/2019, 02/01/2021, 02/07/2023   H1N1 04/21/2008   Influenza Whole 04/01/2007, 02/21/2010, 02/13/2011, 03/18/2012   Influenza, High Dose Seasonal PF 03/06/2016, 01/10/2017, 02/01/2018   Influenza,inj,Quad PF,6+ Mos 01/20/2013, 05/04/2015, 01/13/2020   PFIZER(Purple  Top)SARS-COV-2 Vaccination 07/06/2019, 08/03/2019, 02/13/2020, 11/11/2020   Pneumococcal Conjugate-13 08/12/2013   Pneumococcal Polysaccharide-23 03/07/2004, 05/17/2017   Td 07/04/2004, 08/16/2020   Tdap 05/04/2015   Zoster Recombinant(Shingrix ) 03/08/2019   Zoster, Live 04/29/2010    Screening Tests Health Maintenance  Topic Date Due   Zoster Vaccines- Shingrix  (2 of 2) 05/03/2019   COVID-19 Vaccine (5 - 2024-25 season) 01/14/2023   INFLUENZA VACCINE  12/14/2023   OPHTHALMOLOGY EXAM  01/18/2024   FOOT EXAM  03/13/2024   HEMOGLOBIN A1C  03/26/2024   Diabetic kidney evaluation - eGFR measurement  09/23/2024   Diabetic kidney evaluation - Urine ACR  09/23/2024   Medicare Annual Wellness (AWV)  12/02/2024   DTaP/Tdap/Td (4 - Td or Tdap) 08/17/2030   Pneumococcal Vaccine: 50+ Years  Completed   Hepatitis B Vaccines  Aged Out   HPV VACCINES  Aged Out   Meningococcal B Vaccine  Aged Out    Health Maintenance  Health Maintenance Due  Topic Date Due   Zoster Vaccines- Shingrix  (2 of 2) 05/03/2019   COVID-19 Vaccine (5 - 2024-25 season) 01/14/2023   Health Maintenance Items Addressed: See Nurse Notes at the end of this note  Additional Screening:  Vision Screening: Recommended annual ophthalmology exams for early detection of glaucoma and other disorders of the eye. Would you like a referral to an eye doctor? No    Dental Screening: Recommended annual dental exams for proper oral hygiene  Community Resource Referral / Chronic Care Management: CRR required this visit?  No   CCM required this visit?  No   Plan:    I have personally reviewed and noted the following in the patient's chart:   Medical and social history Use of alcohol, tobacco or illicit drugs  Current medications and supplements including opioid prescriptions. Patient is not currently taking opioid prescriptions. Functional ability and status Nutritional status Physical activity Advanced  directives List of other physicians Hospitalizations, surgeries, and ER visits in previous 12 months Vitals Screenings to include cognitive, depression, and falls Referrals and appointments  In addition, I have reviewed and discussed with patient certain preventive protocols, quality metrics, and best practice recommendations. A written personalized care plan for preventive services as well as general preventive health recommendations were provided to patient.   Mackenzee Becvar L Judd Mccubbin, CMA   12/03/2023   After Visit Summary: (MyChart) Due to this being a telephonic visit, the after visit summary with patients personalized plan was offered to patient via MyChart   Notes: Patient is due for a 2nd shingles vaccine.  He stated that he will call the office to schedule his next office visit.

## 2023-12-03 NOTE — Patient Instructions (Signed)
 Matthew Villanueva , Thank you for taking time out of your busy schedule to complete your Annual Wellness Visit with me. I enjoyed our conversation and look forward to speaking with you again next year. I, as well as your care team,  appreciate your ongoing commitment to your health goals. Please review the following plan we discussed and let me know if I can assist you in the future. Your Game plan/ To Do List   Follow up Visits: Next Medicare AWV with our clinical staff: 12/03/2024.   Have you seen your provider in the last 6 months (3 months if uncontrolled diabetes)? Yes Next Office Visit with your provider: Patient stated that he will call the office to schedule his next office visit.  Last visit was on 09/24/2023.  Clinician Recommendations:  Aim for 30 minutes of exercise or brisk walking, 6-8 glasses of water, and 5 servings of fruits and vegetables each day. You are due for a 2nd shingles vaccine.  You can get this done at your local pharmacy.      This is a list of the screening recommended for you and due dates:  Health Maintenance  Topic Date Due   Zoster (Shingles) Vaccine (2 of 2) 05/03/2019   COVID-19 Vaccine (5 - 2024-25 season) 01/14/2023   Flu Shot  12/14/2023   Eye exam for diabetics  01/18/2024   Complete foot exam   03/13/2024   Hemoglobin A1C  03/26/2024   Yearly kidney function blood test for diabetes  09/23/2024   Yearly kidney health urinalysis for diabetes  09/23/2024   Medicare Annual Wellness Visit  12/02/2024   DTaP/Tdap/Td vaccine (4 - Td or Tdap) 08/17/2030   Pneumococcal Vaccine for age over 79  Completed   Hepatitis B Vaccine  Aged Out   HPV Vaccine  Aged Out   Meningitis B Vaccine  Aged Out    Advanced directives: (Copy Requested) Please bring a copy of your health care power of attorney and living will to the office to be added to your chart at your convenience. You can mail to [facility] [address] [place], [place] [address] or email to  [email or web address] Advance Care Planning is important because it:  [x]  Makes sure you receive the medical care that is consistent with your values, goals, and preferences  [x]  It provides guidance to your family and loved ones and reduces their decisional burden about whether or not they are making the right decisions based on your wishes.  Follow the link provided in your after visit summary or read over the paperwork we have mailed to you to help you started getting your Advance Directives in place. If you need assistance in completing these, please reach out to us  so that we can help you!  See attachments for Preventive Care and Fall Prevention Tips.

## 2023-12-05 ENCOUNTER — Ambulatory Visit (HOSPITAL_COMMUNITY)
Admission: RE | Admit: 2023-12-05 | Discharge: 2023-12-05 | Disposition: A | Discharge status: Home / Self Care | Source: Ambulatory Visit | Attending: Nurse Practitioner | Admitting: Nurse Practitioner

## 2023-12-05 DIAGNOSIS — I6523 Occlusion and stenosis of bilateral carotid arteries: Secondary | ICD-10-CM | POA: Diagnosis not present

## 2023-12-12 ENCOUNTER — Ambulatory Visit: Payer: Self-pay | Admitting: Nurse Practitioner

## 2023-12-20 ENCOUNTER — Other Ambulatory Visit: Payer: Self-pay | Admitting: Internal Medicine

## 2023-12-20 DIAGNOSIS — M1A09X0 Idiopathic chronic gout, multiple sites, without tophus (tophi): Secondary | ICD-10-CM

## 2024-01-06 NOTE — Progress Notes (Signed)
 HPI M followed for OSA, previously intolerant of CPAP, complicated by HTN, DM/ Neuropathy, OA, BPH/Nocturia, Gout, HST (SNAP) 10/17/23- AHI 25.2/hr, desat to 79%, body weight 239 lbs  ============================================================================================================================   From Dr DeDios 2017: patient was diagnosed with severe OSA based on PSG done in 2002. AHI was 47. He had a rpt PAP study in 08/2010 which showed he was good on BiPaP 19/15.  He used cpap x 3 mos in 2002.  He was non compliant so DME got CPAP.  He could not tolerate cpap. He was claustrophobic.  He tried an oral device but once his back teeth were pulled, oral device was deemed not working.   10/04/23- 88 yoM for sleep evaluation courtesy of Dr Debby Molt with concern of OSA Medical problem list includes HTN, DM/ Neuropathy, OA, BPH/Nocturia, Gout,  Sleep Consult-snoring, wakes up feeling tired Discussed the use of AI scribe software for clinical note transcription with the patient, who gave verbal consent to proceed. He has  been off CPAP for years, but symptoms are obtrusive and he asks re-evaluation. Lives alone. History of Present Illness   Matthew Villanueva is an 88 year old male with sleep apnea who presents with worsening symptoms and fatigue.  He experiences significant fatigue and wakes up feeling very tired in the morning. A sleep study in 2017 showed moderately severe sleep apnea with 26 apneic episodes per hour. He has tried various treatments without success and does not use any sleep medication.  He consumes three to four glasses of iced tea and a cup of coffee daily. He experiences nocturia, waking three to four times per night, but can return to sleep. 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  He sleeps alone and denies unusual sleep behaviors such as  kicking, punching, or sleepwalking. He does not find himself in different parts of the house upon waking. He has a couple of dental implants, but otherwise teeth are his own. History of cardiac ablation for tachycardia and EKG apparently suggested possible old MI. He is followed by cardiology.   Assessment and Plan:    Obstructive Sleep Apnea Chronic moderate obstructive sleep apnea with excessive daytime sleepiness and nocturnal awakenings. Discussed CPAP and Inspire therapy options. - Order home sleep study to reassess severity. - Consider CPAP if severity unchanged. - Discuss ENT referral for Inspire if CPAP intolerant.  Enlarged Prostate Enlarged prostate contributing to nocturia, exacerbating sleep disturbances.  Tachycardia and Ablation Tachycardia previously treated with ablation. Recent EKG irregularities noted. - Follow-up with cardiologist on June 24th.     01/08/24- 88yoM followed for OSA, previously intolerant of CPAP, complicated by HTN, DM/ Neuropathy, OA, BPH/Nocturia, Gout, HST (SNAP) 10/17/23- AHI 25.2/hr, desat to 79%, body weight 239 lbs Body weight today- To consider treatment options Discussed the use of AI scribe software for clinical note transcription with the patient, who gave verbal consent to proceed.  History of Present Illness   Matthew Villanueva is an 88 year old male with sleep apnea who presents with CPAP intolerance.  He experiences CPAP intolerance despite attempts with various mask styles, including full face, nasal, and hybrid masks. A recent sleep study shows 25-26 apneic episodes per hour, a reduction from the initial 45 episodes per hour. He has not tried BiPAP therapy. Oral appliances are not feasible due to previous dental extractions. He would like another CPAP mask fitting trial  before referral to ENT to discuss Inspire.     Assessment and Plan:    Obstructive sleep apnea Obstructive sleep apnea with AHI of 25-26 events/hour. CPAP  previously intolerable due to mask discomfort. BiPAP and oral appliances not viable. Discussed Inspire nerve stimulator as alternative, but he prefers retrying CPAP with different masks. - Order CPAP setup based on recent sleep study qualification. - Instruct home care company to assist in finding a suitable and comfortable CPAP mask. - Refer to ENT for consultation regarding Inspire nerve stimulator procedure if CPAP remains intolerable.        ROS-see HPI   + = positive Constitutional:    weight loss, night sweats, fevers, chills, fatigue, lassitude. HEENT:    headaches, difficulty swallowing, tooth/dental problems, sore throat,       +sneezing, itching, ear ache, +nasal congestion, post nasal drip, snoring CV:    chest pain, orthopnea, PND, swelling in lower extremities, anasarca,                                   dizziness, palpitations Resp:   +shortness of breath with exertion or at rest.                productive cough,   non-productive cough, coughing up of blood.              change in color of mucus.  wheezing.   Skin:    rash or lesions. GI:  No-   heartburn, indigestion, abdominal pain, nausea, vomiting, diarrhea,                 change in bowel habits, loss of appetite GU: dysuria, change in color of urine, no urgency or frequency.   flank pain. MS:   joint pain, +stiffness, decreased range of motion, back pain. Neuro-     nothing unusual Psych:  change in mood or affect.  depression or anxiety.   memory loss.  OBJ- Physical Exam General- Alert, Oriented, Affect-appropriate, Distress- none acute, +overweight Skin- rash-none, lesions- none, excoriation- none Lymphadenopathy- none Head- atraumatic            Eyes- Gross vision intact, PERRLA, conjunctivae and secretions clear            Ears- Hearing, canals-normal            Nose- Clear, no-Septal dev, mucus, polyps, erosion, perforation             Throat- Mallampati II , mucosa clear , drainage- none, tonsils-  atrophic Neck- flexible , trachea midline, no stridor , thyroid  nl, carotid no bruit Chest - symmetrical excursion , unlabored           Heart/CV- RRR , no murmur , no gallop  , no rub, nl s1 s2                           - JVD- none , edema- none, stasis changes- none, varices- none           Lung- clear to P&A, wheeze- none, cough- none , dullness-none, rub- none           Chest wall-  Abd-  Br/ Gen/ Rectal- Not done, not indicated Extrem- cyanosis- none, clubbing, none, atrophy- none, strength- nl Neuro- grossly intact to observation

## 2024-01-08 ENCOUNTER — Encounter: Payer: Self-pay | Admitting: Internal Medicine

## 2024-01-08 ENCOUNTER — Ambulatory Visit: Admitting: Internal Medicine

## 2024-01-08 VITALS — BP 138/64 | HR 65 | Temp 97.9°F | Ht 71.0 in | Wt 242.6 lb

## 2024-01-08 DIAGNOSIS — G4733 Obstructive sleep apnea (adult) (pediatric): Secondary | ICD-10-CM | POA: Diagnosis not present

## 2024-01-08 NOTE — Patient Instructions (Signed)
 Order- new DME, new CPAP auto 5-20, mask of choice- needs to explore options. Humidifier, supplies, airView/ card

## 2024-01-12 ENCOUNTER — Encounter: Payer: Self-pay | Admitting: Internal Medicine

## 2024-01-26 ENCOUNTER — Other Ambulatory Visit: Payer: Self-pay | Admitting: Internal Medicine

## 2024-01-26 DIAGNOSIS — E1121 Type 2 diabetes mellitus with diabetic nephropathy: Secondary | ICD-10-CM

## 2024-01-26 DIAGNOSIS — I10 Essential (primary) hypertension: Secondary | ICD-10-CM

## 2024-02-01 ENCOUNTER — Ambulatory Visit: Payer: Self-pay | Admitting: *Deleted

## 2024-02-01 DIAGNOSIS — G4733 Obstructive sleep apnea (adult) (pediatric): Secondary | ICD-10-CM | POA: Diagnosis not present

## 2024-02-01 NOTE — Telephone Encounter (Signed)
 FYI Only or Action Required?: Action required by provider: update on patient condition.  Patient was last seen in primary care on 09/24/2023 by Joshua Debby CROME, MD.  Called Nurse Triage reporting Hyperglycemia.  Symptoms began several months ago.  Interventions attempted: Other: Patient has stopped new Rx and resumed previous Rx.  Symptoms are: gradually improving.  Triage Disposition: Call PCP Now  Patient/caregiver understands and will follow disposition?: Yes   Reason for Disposition  [1] Caller has URGENT medication or insulin  device (e.g., pump, continuous monitoring) question AND [2] triager unable to answer question  Answer Assessment - Initial Assessment Questions 1. BLOOD GLUCOSE: What is your blood glucose level?      155 this morning fasting 2. ONSET: When did you check the blood glucose?     Fasting daily 3. USUAL RANGE: What is your glucose level usually? (e.g., usual fasting morning value, usual evening value)     Before Ozempic - 122/123, Patient states he had recent change in his diabetic medication- he was taken off oral pills and put on injection.  Patient has been taking  Ozempic  properly for 2 months- levels are not coming down. Patient has stopped the ozempic  and has restarted the Rebelsis - level is now 133  5. TYPE 1 or 2:  Do you know what type of diabetes you have?  (e.g., Type 1, Type 2, Gestational; doesn't know)      Type 2 6. INSULIN : Do you take insulin ? What type of insulin (s) do you use? What is the mode of delivery? (syringe, pen; injection or pump)?      no 7. DIABETES PILLS: Do you take any pills for your diabetes? If Yes, ask: Have you missed taking any pills recently?     Rebelsis 8. OTHER SYMPTOMS: Do you have any symptoms? (e.g., fever, frequent urination, difficulty breathing, dizziness, weakness, vomiting)     no  Protocols used: Diabetes - High Blood Sugar-A-AH  Copied from CRM [id number]. Topic: Clinical - Red Word  Triage >> Feb 01, 2024  4:09 PM Dedra B wrote: Red Word that prompted transfer to Nurse Triage: Pt said his diabetes meds are not working for him. Blood glucose has been as high as 175. Warm transfer to nurse triage.

## 2024-02-07 ENCOUNTER — Ambulatory Visit: Payer: Self-pay | Admitting: Internal Medicine

## 2024-02-07 ENCOUNTER — Ambulatory Visit: Admitting: Internal Medicine

## 2024-02-07 ENCOUNTER — Encounter: Payer: Self-pay | Admitting: Internal Medicine

## 2024-02-07 VITALS — BP 128/68 | HR 64 | Temp 97.7°F | Resp 16 | Ht 71.0 in | Wt 242.2 lb

## 2024-02-07 DIAGNOSIS — E781 Pure hyperglyceridemia: Secondary | ICD-10-CM

## 2024-02-07 DIAGNOSIS — L409 Psoriasis, unspecified: Secondary | ICD-10-CM

## 2024-02-07 DIAGNOSIS — H6121 Impacted cerumen, right ear: Secondary | ICD-10-CM | POA: Diagnosis not present

## 2024-02-07 DIAGNOSIS — E1121 Type 2 diabetes mellitus with diabetic nephropathy: Secondary | ICD-10-CM

## 2024-02-07 DIAGNOSIS — Z23 Encounter for immunization: Secondary | ICD-10-CM

## 2024-02-07 DIAGNOSIS — M1A0710 Idiopathic chronic gout, right ankle and foot, without tophus (tophi): Secondary | ICD-10-CM | POA: Diagnosis not present

## 2024-02-07 DIAGNOSIS — I10 Essential (primary) hypertension: Secondary | ICD-10-CM | POA: Diagnosis not present

## 2024-02-07 LAB — CBC WITH DIFFERENTIAL/PLATELET
Basophils Absolute: 0.1 K/uL (ref 0.0–0.1)
Basophils Relative: 1 % (ref 0.0–3.0)
Eosinophils Absolute: 0.2 K/uL (ref 0.0–0.7)
Eosinophils Relative: 2 % (ref 0.0–5.0)
HCT: 44.6 % (ref 39.0–52.0)
Hemoglobin: 15.1 g/dL (ref 13.0–17.0)
Lymphocytes Relative: 22.2 % (ref 12.0–46.0)
Lymphs Abs: 2 K/uL (ref 0.7–4.0)
MCHC: 33.9 g/dL (ref 30.0–36.0)
MCV: 88.3 fl (ref 78.0–100.0)
Monocytes Absolute: 0.8 K/uL (ref 0.1–1.0)
Monocytes Relative: 8.5 % (ref 3.0–12.0)
Neutro Abs: 5.9 K/uL (ref 1.4–7.7)
Neutrophils Relative %: 66.3 % (ref 43.0–77.0)
Platelets: 220 K/uL (ref 150.0–400.0)
RBC: 5.05 Mil/uL (ref 4.22–5.81)
RDW: 14.1 % (ref 11.5–15.5)
WBC: 9 K/uL (ref 4.0–10.5)

## 2024-02-07 LAB — BASIC METABOLIC PANEL WITH GFR
BUN: 24 mg/dL — ABNORMAL HIGH (ref 6–23)
CO2: 24 meq/L (ref 19–32)
Calcium: 9.2 mg/dL (ref 8.4–10.5)
Chloride: 101 meq/L (ref 96–112)
Creatinine, Ser: 1.04 mg/dL (ref 0.40–1.50)
GFR: 65.27 mL/min (ref >60.00)
Glucose, Bld: 172 mg/dL — ABNORMAL HIGH (ref 70–99)
Potassium: 3.9 meq/L (ref 3.5–5.1)
Sodium: 137 meq/L (ref 135–145)

## 2024-02-07 LAB — TRIGLYCERIDES: Triglycerides: 171 mg/dL — ABNORMAL HIGH (ref 0.0–149.0)

## 2024-02-07 LAB — TSH: TSH: 1.37 u[IU]/mL (ref 0.35–5.50)

## 2024-02-07 LAB — HEMOGLOBIN A1C: Hgb A1c MFr Bld: 8.2 % — ABNORMAL HIGH (ref 4.6–6.5)

## 2024-02-07 MED ORDER — ZORYVE 0.3 % EX CREA
1.0000 | TOPICAL_CREAM | Freq: Every day | CUTANEOUS | 5 refills | Status: AC
Start: 2024-02-07 — End: ?

## 2024-02-07 MED ORDER — RYBELSUS 7 MG PO TABS: 7.0000 mg | ORAL_TABLET | Freq: Every day | ORAL | 1 refills | Status: DC

## 2024-02-07 MED ORDER — RYBELSUS 14 MG PO TABS: 14.0000 mg | ORAL_TABLET | Freq: Every day | ORAL | 1 refills | Status: AC

## 2024-02-07 MED ORDER — COVID-19 MRNA VAC-TRIS(PFIZER) 30 MCG/0.3ML IM SUSY
0.3000 mL | PREFILLED_SYRINGE | Freq: Once | INTRAMUSCULAR | 0 refills | Status: AC
Start: 2024-02-07 — End: 2024-02-07

## 2024-02-07 MED ORDER — OLMESARTAN MEDOXOMIL 20 MG PO TABS: 20.0000 mg | ORAL_TABLET | Freq: Every day | ORAL | 1 refills | Status: DC

## 2024-02-07 NOTE — Patient Instructions (Signed)

## 2024-02-07 NOTE — Progress Notes (Signed)
 Subjective:  Patient ID: Matthew Villanueva, male    DOB: 24-Feb-1936  Age: 88 y.o. MRN: [id number]  CC: Ears Cleaned (Patient states that he needs his ears cleaned because he thinks he has a build up of wax. ), Rash, and Diabetes   HPI Matthew Villanueva presents for f/up ---  Discussed the use of AI scribe software for clinical note transcription with the patient, who gave verbal consent to proceed.  History of Present Illness Matthew Villanueva is an 88 year old male with diabetes who presents with medication management concerns.  He discontinued Ozempic  due to its ineffectiveness and switched to Rybelsus , although he is unsure of the dose. His blood sugar has stabilized around 134 mg/dL for the past week. Previously, while on Ozempic , his blood sugar ranged from 160 to 170 mg/dL despite no changes in his eating habits. Initially, he had issues with the administration of Ozempic  due to not removing the plastic cap, which he corrected after a month, but still did not see improvement.  He experiences persistent hunger. No chest pain, shortness of breath, dizziness, or lightheadedness. He remains active and reports no significant pain from neuropathy, although he mentions worsening balance. He recently banged his leg about a week ago but did not fall.  He has psoriasis located on his arm and tailbone, which he is not currently treating. He previously experienced a yeast infection while on Jardiance , which resolved after discontinuing the medication.  He reports that he is currently taking a medication he refers to as 'Arumavis' and has a month's supply remaining. He previously received this medication for free and is interested in continuing it.     Outpatient Medications Prior to Visit  Medication Sig Dispense Refill   allopurinol  (ZYLOPRIM ) 100 MG tablet TAKE 1 TABLET BY MOUTH DAILY 90 tablet 0   aspirin EC 81 MG tablet Take 81 mg by mouth daily. Swallow whole.      blood glucose meter kit and supplies KIT Use to test blood sugar up to twice daily. DX: E11.21 1 each 0   colchicine  0.6 MG tablet Take 1 tablet (0.6 mg total) by mouth daily. 90 tablet 0   Continuous Glucose Sensor (FREESTYLE LIBRE 3 PLUS SENSOR) MISC Apply 1 Act topically every 14 (fourteen) days. Change sensor every 15 days. 6 each 1   glucose blood (COOL BLOOD GLUCOSE TEST STRIPS) test strip Use to test blood sugar up to twice daily. DX: E11.21 100 each 12   Insulin  Pen Needle 32G X 6 MM MISC 1 Act by Does not apply route once a week. 100 each 0   Lancets (ONETOUCH ULTRASOFT) lancets Use to test blood sugar up to twice daily. DX: E11.21 100 each 12   Multiple Vitamin (MULTIVITAMIN) tablet Take 1 tablet by mouth daily.     nystatin  cream (MYCOSTATIN ) Apply 1 Application topically 2 (two) times daily. 30 g 3   ONETOUCH VERIO test strip USE TO TEST BLOOD SUGAR TWO TIMES A DAY 150 strip 5   losartan -hydrochlorothiazide (HYZAAR) 100-25 MG tablet TAKE 1 TABLET BY MOUTH DAILY 90 tablet 0   Semaglutide ,0.25 or 0.5MG /DOS, (OZEMPIC , 0.25 OR 0.5 MG/DOSE,) 2 MG/3ML SOPN Inject 0.5 mg into the skin once a week. 3 mL 0   No facility-administered medications prior to visit.    ROS Review of Systems  Constitutional:  Negative for appetite change, chills, diaphoresis, fatigue and fever.  HENT:  Positive for hearing loss. Negative for ear  pain, facial swelling, sinus pain, sore throat and trouble swallowing.   Eyes: Negative.   Respiratory: Negative.  Negative for cough, chest tightness and wheezing.   Cardiovascular:  Negative for chest pain, palpitations and leg swelling.  Gastrointestinal: Negative.  Negative for abdominal pain, constipation, diarrhea, nausea and vomiting.  Genitourinary:  Negative for difficulty urinating.  Musculoskeletal:  Positive for arthralgias and gait problem. Negative for joint swelling.  Skin:  Positive for rash and wound. Negative for color change.  Neurological:  Negative  for weakness.  Hematological:  Negative for adenopathy. Does not bruise/bleed easily.  Psychiatric/Behavioral:  Positive for confusion and decreased concentration. Negative for sleep disturbance. The patient is not nervous/anxious.     Objective:  BP 128/68 (BP Location: Left Arm, Patient Position: Sitting, Cuff Size: Normal)   Pulse 64   Temp 97.7 F (36.5 C) (Oral)   Resp 16   Ht 5' 11 (1.803 m)   Wt 242 lb 3.2 oz (109.9 kg)   SpO2 94%   BMI 33.78 kg/m   BP Readings from Last 3 Encounters:  02/07/24 128/68  01/08/24 138/64  11/06/23 126/62    Wt Readings from Last 3 Encounters:  02/07/24 242 lb 3.2 oz (109.9 kg)  01/08/24 242 lb 9.6 oz (110 kg)  12/03/23 239 lb (108.4 kg)    Physical Exam Vitals reviewed.  Constitutional:      Appearance: Normal appearance.  HENT:     Right Ear: Decreased hearing noted. There is no impacted cerumen. No foreign body.     Left Ear: Decreased hearing noted. There is impacted cerumen. No foreign body.     Ears:     Comments: Patient's verbal consent obtained prior to lighted suction removal.  I used the Bionix lighted suction to remove the cerumen.  The cerumen was removed and a full view of tympanic membrane was possible after the procedure.  Patient tolerated procedure well.       Nose: Nose normal.     Mouth/Throat:     Mouth: Mucous membranes are moist.  Eyes:     Conjunctiva/sclera: Conjunctivae normal.  Cardiovascular:     Rate and Rhythm: Normal rate and regular rhythm.     Heart sounds: No murmur heard.    No friction rub. No gallop.  Pulmonary:     Effort: Pulmonary effort is normal.     Breath sounds: No stridor. No wheezing, rhonchi or rales.  Abdominal:     General: Abdomen is protuberant. There is no distension.     Palpations: There is no hepatomegaly, splenomegaly or mass.     Tenderness: There is no abdominal tenderness. There is no guarding.     Hernia: No hernia is present.  Musculoskeletal:        General:  Normal range of motion.     Cervical back: Neck supple.     Right lower leg: No edema.     Left lower leg: No edema.  Lymphadenopathy:     Cervical: No cervical adenopathy.  Skin:    General: Skin is warm and dry.     Findings: Lesion and rash present.  Neurological:     General: No focal deficit present.     Mental Status: He is alert. Mental status is at baseline.  Psychiatric:        Mood and Affect: Mood normal.        Behavior: Behavior normal.     Lab Results  Component Value Date   WBC 9.0 02/07/2024  HGB 15.1 02/07/2024   HCT 44.6 02/07/2024   PLT 220.0 02/07/2024   GLUCOSE 172 (H) 02/07/2024   CHOL 171 09/24/2023   TRIG 171.0 (H) 02/07/2024   HDL 40.50 09/24/2023   LDLDIRECT 81.0 01/05/2022   LDLCALC 61 09/24/2023   ALT 22 09/24/2023   AST 22 09/24/2023   NA 137 02/07/2024   K 3.9 02/07/2024   CL 101 02/07/2024   CREATININE 1.04 02/07/2024   BUN 24 (H) 02/07/2024   CO2 24 02/07/2024   TSH 1.37 02/07/2024   PSA 2.86 12/03/2017   HGBA1C 8.2 (H) 02/07/2024   MICROALBUR 12.8 (H) 09/24/2023    VAS US  CAROTID Result Date: 12/05/2023 Carotid Arterial Duplex Study Patient Name:  Matthew Villanueva  Date of Exam:   12/05/2023 Medical Rec #: [id number]             Accession #:    [id number] Date of Birth: 1936/01/18            Patient Gender: M Patient Age:   37 years Exam Location:  [address] Procedure:      VAS US  CAROTID Referring Phys: EMILY MONGE --------------------------------------------------------------------------------  Indications:  Carotid artery disease. Risk Factors: Hypertension, hyperlipidemia, Diabetes, past history of smoking. Performing Technologist: Garnette Rockers  Examination Guidelines: A complete evaluation includes B-mode imaging, spectral Doppler, color Doppler, and power Doppler as needed of all accessible portions of each vessel. Bilateral testing is considered an integral part of a complete examination. Limited examinations for  reoccurring indications may be performed as noted.  Right Carotid Findings: +----------+--------+--------+--------+------------------+------------------+           PSV cm/sEDV cm/sStenosisPlaque DescriptionComments           +----------+--------+--------+--------+------------------+------------------+ CCA Prox  127     12                                intimal thickening +----------+--------+--------+--------+------------------+------------------+ CCA Distal96      12                                intimal thickening +----------+--------+--------+--------+------------------+------------------+ ICA Prox  80      8       1-39%   hyperechoic                          +----------+--------+--------+--------+------------------+------------------+ ICA Distal159     17                                                   +----------+--------+--------+--------+------------------+------------------+ ECA       120     7                                                    +----------+--------+--------+--------+------------------+------------------+ +----------+--------+-------+--------+-------------------+           PSV cm/sEDV cmsDescribeArm Pressure (mmHG) +----------+--------+-------+--------+-------------------+ Subclavian171                    147                 +----------+--------+-------+--------+-------------------+ +---------+--------+--+--------+-+  VertebralPSV cm/s41EDV cm/s6 +---------+--------+--+--------+-+  Left Carotid Findings: +----------+--------+--------+--------+------------------+------------------+           PSV cm/sEDV cm/sStenosisPlaque DescriptionComments           +----------+--------+--------+--------+------------------+------------------+ CCA Prox  92      11                                intimal thickening +----------+--------+--------+--------+------------------+------------------+ CCA Distal79      11                                 intimal thickening +----------+--------+--------+--------+------------------+------------------+ ICA Prox  61      10      1-39%   calcific                             +----------+--------+--------+--------+------------------+------------------+ ICA Distal96      20                                                   +----------+--------+--------+--------+------------------+------------------+ ECA       116     9                                                    +----------+--------+--------+--------+------------------+------------------+ +----------+--------+--------+--------+-------------------+           PSV cm/sEDV cm/sDescribeArm Pressure (mmHG) +----------+--------+--------+--------+-------------------+ Subclavian221                     156                 +----------+--------+--------+--------+-------------------+ +---------+--------+--+--------+-+ VertebralPSV cm/s59EDV cm/s9 +---------+--------+--+--------+-+   Summary: Right Carotid: Velocities in the right ICA are consistent with a 1-39% stenosis.                Mild mixed plaque noted in the carotid artery. Left Carotid: Velocities in the left ICA are consistent with a 1-39% stenosis.               Mild mixed plaque noted in the carotid artery. Vertebrals:  Bilateral vertebral arteries demonstrate antegrade flow. Subclavians: Normal flow hemodynamics were seen in bilateral subclavian              arteries. *See table(s) above for measurements and observations.  Electronically signed by Gordy Bergamo MD on 12/05/2023 at 6:22:58 PM.    Final     Assessment & Plan:   Essential hypertension- His BP is over-controlled. Will discontinue the thiazide diuretic. -     CBC with Differential/Platelet; Future -     Basic metabolic panel with GFR; Future -     TSH; Future -     Olmesartan  Medoxomil; Take 1 tablet (20 mg total) by mouth daily.  Dispense: 90 tablet; Refill: 1  Idiopathic chronic gout of right  foot without tophus -     Basic metabolic panel with GFR; Future  Type 2 diabetes mellitus with diabetic nephropathy, without long-term current use of insulin  (HCC)- A1C is 8.2%. Will increase the rybelsus  dose. -  Basic metabolic panel with GFR; Future -     Hemoglobin A1c; Future -     AMB Referral VBCI Care Management -     Olmesartan  Medoxomil; Take 1 tablet (20 mg total) by mouth daily.  Dispense: 90 tablet; Refill: 1 -     Rybelsus ; Take 1 tablet (14 mg total) by mouth daily.  Dispense: 90 tablet; Refill: 1  Pure hyperglyceridemia -     Triglycerides; Future  Need for immunization against influenza -     Flu vaccine HIGH DOSE PF(Fluzone Trivalent)  Psoriasis -     Zoryve ; Apply 1 Act topically daily.  Dispense: 60 g; Refill: 5 -     AMB Referral VBCI Care Management  Hearing loss of right ear due to cerumen impaction  Other orders -     COVID-19 mRNA Vac-TriS(Pfizer); Inject 0.3 mLs into the muscle once for 1 dose.  Dispense: 0.3 mL; Refill: 0     Follow-up: Return in about 6 months (around 08/06/2024).  Debby Molt, MD

## 2024-02-12 ENCOUNTER — Telehealth: Payer: Self-pay | Admitting: *Deleted

## 2024-02-12 NOTE — Progress Notes (Unsigned)
 Care Guide Pharmacy Note  02/12/2024 Name: Lionell Matuszak MRN: [id number] DOB: 1935/05/29  Referred By: Joshua Debby CROME, MD Reason for referral: Call Attempt #1 and Complex Care Management (Outreach to schedule referral with pharmacist )   Morton Carlin English is a 88 y.o. year old male who is a primary care patient of Joshua, Debby CROME, MD.  Morton Carlin English was referred to the pharmacist for assistance related to: DMII  An unsuccessful telephone outreach was attempted today to contact the patient who was referred to the pharmacy team for assistance with medication management. Additional attempts will be made to contact the patient.  Thedford Franks, CMA, [place]  [facility], [facility] Guide Direct Dial: [phone]  Fax: [phone] Website: [email or web address]

## 2024-02-13 NOTE — Progress Notes (Signed)
 Care Guide Pharmacy Note  02/13/2024 Name: [name] MRN: [id number] DOB: [birth year]  Referred By: Joshua Debby CROME, MD Reason for referral: Call Attempt #1 and Complex Care Management (Outreach to schedule referral with pharmacist )   Matthew Villanueva is a 89 y.o. year old male who is a primary care patient of Joshua Debby CROME, MD.  Matthew Villanueva was referred to the pharmacist for assistance related to: DMII  Successful contact was made with the patient to discuss pharmacy services including being ready for the pharmacist to call at least 5 minutes before the scheduled appointment time and to have medication bottles and any blood pressure readings ready for review. The patient agreed to meet with the pharmacist via telephone visit on 02/25/2024  Thedford Franks, CMA [place]  [facility], [facility] Guide Direct Dial: [phone]  Fax: [phone] Website: [email or web address]

## 2024-02-15 ENCOUNTER — Encounter: Payer: Self-pay | Admitting: Pharmacist

## 2024-02-25 ENCOUNTER — Other Ambulatory Visit (INDEPENDENT_AMBULATORY_CARE_PROVIDER_SITE_OTHER): Admitting: Pharmacist

## 2024-02-25 DIAGNOSIS — I10 Essential (primary) hypertension: Secondary | ICD-10-CM

## 2024-02-25 MED ORDER — LOSARTAN POTASSIUM-HCTZ 50-12.5 MG PO TABS: 1.0000 | ORAL_TABLET | Freq: Every day | ORAL | 1 refills | Status: AC

## 2024-02-25 NOTE — Patient Instructions (Signed)
 It was a pleasure speaking with you today!  STOP Olmesartan  START Losartan /hydrochlorothiazide 50/12.5 mg daily  Continue monitoring blood pressure.  Feel free to call with any questions or concerns!  Darrelyn Drum, PharmD, BCPS, CPP Clinical Pharmacist Practitioner [place] at [facility] [phone]

## 2024-02-25 NOTE — Progress Notes (Signed)
 02/25/2024 Name: Antaeus Karel MRN: [id number] DOB: 24-Apr-1936  Chief Complaint  Patient presents with   Diabetes   Medication Management    Matthew Villanueva is a 88 y.o. year old male who presented for a telephone visit.   They were referred to the pharmacist by their PCP for assistance in managing diabetes.    Subjective:  Care Team: Primary Care Provider: Joshua Debby CROME, MD ; Next Scheduled Visit: none scheduled  Medication Access/Adherence  Current Pharmacy:  [facility] [id number] [place], [place] - [address] [place] [address] Phone: [phone] Fax: [phone]  [facility] [place], [place] - [address] [phone] [place] [address] Phone: [phone] Fax: [phone]   Patient reports affordability concerns with their medications: Yes  Patient reports access/transportation concerns to their pharmacy: No  Patient reports adherence concerns with their medications:  Yes     Diabetes:  Current medications: Rybelsus  14 mg daily (pt unsure which dose he currently has) Previous medications: Jardiance  (recurrent yeast infection), Ozempic  (pt reports ineffective, administration error)  Home BG readings: Fasting 125 this morning. Sometimes down to 90s and will skip Rybelsus   Current medication access support: Rybelsus  PAP    Hypertension: Current medications: olmesartan  20 mg daily Recently switched from losartan /hydrochlorothiazide 100/25 mg daily at last PCP visit due to over controlled BP  Home BP readings: 157/87 - has been in the 150s, lowest 138 since changing to olmesartan  Previously 120/60s on losartan /hydrochlorothiazide, no dizziness or lightheadedness reported  Objective:  Lab Results  Component Value Date   HGBA1C 8.2 (H) 02/07/2024    Lab Results  Component Value Date   CREATININE 1.04 02/07/2024   BUN 24 (H) 02/07/2024   NA 137 02/07/2024   K 3.9  02/07/2024   CL 101 02/07/2024   CO2 24 02/07/2024    Lab Results  Component Value Date   CHOL 171 09/24/2023   HDL 40.50 09/24/2023   LDLCALC 61 09/24/2023   LDLDIRECT 81.0 01/05/2022   TRIG 171.0 (H) 02/07/2024   CHOLHDL 4 09/24/2023    Medications Reviewed Today     Reviewed by Merceda Lela SAUNDERS, RPH (Pharmacist) on 02/25/24 at 1607  Med List Status: <None>   Medication Order Taking? Sig Documenting Provider Last Dose Status Informant  allopurinol  (ZYLOPRIM ) 100 MG tablet [id number]  TAKE 1 TABLET BY MOUTH DAILY Joshua Debby CROME, MD  Active   aspirin EC 81 MG tablet [id number]  Take 81 mg by mouth daily. Swallow whole. [provider]  Active   blood glucose meter kit and supplies KIT [id number]  Use to test blood sugar up to twice daily. DX: E11.21 Joshua Debby CROME, MD  Active   colchicine  0.6 MG tablet [id number]  Take 1 tablet (0.6 mg total) by mouth daily. Norleen Lynwood ORN, MD  Active   Continuous Glucose Sensor (FREESTYLE LIBRE 3 PLUS SENSOR) [place] [id number]  Apply 1 Act topically every 14 (fourteen) days. Change sensor every 15 days. Joshua Debby CROME, MD  Active   glucose blood (COOL BLOOD GLUCOSE TEST STRIPS) test strip [id number]  Use to test blood sugar up to twice daily. DX: E11.21 Joshua Debby CROME, MD  Active   Insulin  Pen Needle 32G X 6 MM MISC [id number]  1 Act by Does not apply route once a week. Joshua Debby CROME, MD  Active   Lancets [facility] ULTRASOFT) lancets [id number]  Use to test blood sugar  up to twice daily. DX: E11.21 Joshua Debby CROME, MD  Active   Multiple Vitamin (MULTIVITAMIN) tablet [id number]  Take 1 tablet by mouth daily. [provider]  Active   nystatin  cream (MYCOSTATIN ) [id number]  Apply 1 Application topically 2 (two) times daily. Joshua Debby CROME, MD  Active   olmesartan  (BENICAR ) 20 MG tablet [id number] Yes Take 1 tablet (20 mg total) by mouth daily. Joshua Debby CROME, MD  Active   [facility] VERIO test strip [id number]  USE TO TEST BLOOD SUGAR TWO  TIMES A DAY Joshua Debby CROME, MD  Active   Roflumilast  (ZORYVE ) 0.3 % CREA [id number]  Apply 1 Act topically daily. Joshua Debby CROME, MD  Active   Semaglutide  (RYBELSUS ) 14 MG TABS [id number] Yes Take 1 tablet (14 mg total) by mouth daily. Joshua Debby CROME, MD  Active               Assessment/Plan:   Diabetes: - Currently controlled, A1c goal <8% - Continue Rybelsus  14 mg daily. Advised pt to not skip doses for fasting in the 90s - Sent PAP refill request. Discussed change to NovoNordisk PAP no longer providing Rybelsus  through PAP next year. Discussed options to review insurance plans to see what the cost of Rybelsus  may be next year.  Hypertension: Currently uncontrolled, BP goal <130/80 Recommend change back to Losartan /hydrochlorothiazide at reduced dose of 50/25 mg daily Advised pt to stay hydrated  Stop olmesartan   Follow Up Plan: OV in 2 days with PCP  Darrelyn Drum, PharmD, BCPS, CPP Clinical Pharmacist Practitioner [place] [facility] at [facility] [phone]

## 2024-02-27 ENCOUNTER — Ambulatory Visit: Admitting: Internal Medicine

## 2024-03-25 ENCOUNTER — Ambulatory Visit: Payer: Self-pay

## 2024-03-25 NOTE — Telephone Encounter (Signed)
 FYI Only or Action Required?: Action required by provider: request for appointment.  Patient was last seen in primary care on 02/07/2024 by Joshua Debby CROME, MD.  Called Nurse Triage reporting No chief complaint on file..  Symptoms began today.  Interventions attempted: Nothing.  Symptoms are: unchanged.  Triage Disposition: See PCP Within 2 Weeks  Patient/caregiver understands and will follow disposition?: Yes  Copied from CRM [id number]. Topic: Clinical - Red Word Triage >> Mar 25, 2024  8:14 AM Carlyon D wrote: Red Word that prompted transfer to Nurse Triage:  pt wants to schedule an appt due to having High blood pressure Reason for Disposition  [1] Systolic BP >= 130 OR Diastolic >= 80 AND [2] taking BP medications  Answer Assessment - Initial Assessment Questions 1. BLOOD PRESSURE: What is your blood pressure? Did you take at least two measurements 5 minutes apart?     155/83, 145/72  2. ONSET: When did you take your blood pressure?     An hour ago  3. HOW: How did you take your blood pressure? (e.g., automatic home BP monitor, visiting nurse)     Automatic BP Monitor  4. HISTORY: Do you have a history of high blood pressure?     Yes  5. MEDICINES: Are you taking any medicines for blood pressure? Have you missed any doses recently?     Yes, no missed doses  6. OTHER SYMPTOMS: Do you have any symptoms? (e.g., blurred vision, chest pain, difficulty breathing, headache, weakness)     No other symptoms  Protocols used: Blood Pressure - High-A-AH

## 2024-03-26 ENCOUNTER — Ambulatory Visit (INDEPENDENT_AMBULATORY_CARE_PROVIDER_SITE_OTHER): Admitting: Internal Medicine

## 2024-03-26 ENCOUNTER — Encounter: Payer: Self-pay | Admitting: Internal Medicine

## 2024-03-26 VITALS — BP 152/70 | HR 71 | Temp 98.3°F | Ht 71.0 in | Wt 243.0 lb

## 2024-03-26 DIAGNOSIS — I10 Essential (primary) hypertension: Secondary | ICD-10-CM | POA: Diagnosis not present

## 2024-03-26 DIAGNOSIS — E1121 Type 2 diabetes mellitus with diabetic nephropathy: Secondary | ICD-10-CM | POA: Diagnosis not present

## 2024-03-26 MED ORDER — AMLODIPINE BESYLATE 5 MG PO TABS
5.0000 mg | ORAL_TABLET | Freq: Every day | ORAL | 0 refills | Status: AC
Start: 2024-03-26 — End: ?

## 2024-03-26 NOTE — Patient Instructions (Signed)
 Hypertension, Adult High blood pressure (hypertension) is when the force of blood pumping through the arteries is too strong. The arteries are the blood vessels that carry blood from the heart throughout the body. Hypertension forces the heart to work harder to pump blood and may cause arteries to become narrow or stiff. Untreated or uncontrolled hypertension can lead to a heart attack, heart failure, a stroke, kidney disease, and other problems. A blood pressure reading consists of a higher number over a lower number. Ideally, your blood pressure should be below 120/80. The first ("top") number is called the systolic pressure. It is a measure of the pressure in your arteries as your heart beats. The second ("bottom") number is called the diastolic pressure. It is a measure of the pressure in your arteries as the heart relaxes. What are the causes? The exact cause of this condition is not known. There are some conditions that result in high blood pressure. What increases the risk? Certain factors may make you more likely to develop high blood pressure. Some of these risk factors are under your control, including: Smoking. Not getting enough exercise or physical activity. Being overweight. Having too much fat, sugar, calories, or salt (sodium) in your diet. Drinking too much alcohol. Other risk factors include: Having a personal history of heart disease, diabetes, high cholesterol, or kidney disease. Stress. Having a family history of high blood pressure and high cholesterol. Having obstructive sleep apnea. Age. The risk increases with age. What are the signs or symptoms? High blood pressure may not cause symptoms. Very high blood pressure (hypertensive crisis) may cause: Headache. Fast or irregular heartbeats (palpitations). Shortness of breath. Nosebleed. Nausea and vomiting. Vision changes. Severe chest pain, dizziness, and seizures. How is this diagnosed? This condition is diagnosed by  measuring your blood pressure while you are seated, with your arm resting on a flat surface, your legs uncrossed, and your feet flat on the floor. The cuff of the blood pressure monitor will be placed directly against the skin of your upper arm at the level of your heart. Blood pressure should be measured at least twice using the same arm. Certain conditions can cause a difference in blood pressure between your right and left arms. If you have a high blood pressure reading during one visit or you have normal blood pressure with other risk factors, you may be asked to: Return on a different day to have your blood pressure checked again. Monitor your blood pressure at home for 1 week or longer. If you are diagnosed with hypertension, you may have other blood or imaging tests to help your health care provider understand your overall risk for other conditions. How is this treated? This condition is treated by making healthy lifestyle changes, such as eating healthy foods, exercising more, and reducing your alcohol intake. You may be referred for counseling on a healthy diet and physical activity. Your health care provider may prescribe medicine if lifestyle changes are not enough to get your blood pressure under control and if: Your systolic blood pressure is above 130. Your diastolic blood pressure is above 80. Your personal target blood pressure may vary depending on your medical conditions, your age, and other factors. Follow these instructions at home: Eating and drinking  Eat a diet that is high in fiber and potassium, and low in sodium, added sugar, and fat. An example of this eating plan is called the DASH diet. DASH stands for Dietary Approaches to Stop Hypertension. To eat this way: Eat  plenty of fresh fruits and vegetables. Try to fill one half of your plate at each meal with fruits and vegetables. Eat whole grains, such as whole-wheat pasta, brown rice, or whole-grain bread. Fill about one  fourth of your plate with whole grains. Eat or drink low-fat dairy products, such as skim milk or low-fat yogurt. Avoid fatty cuts of meat, processed or cured meats, and poultry with skin. Fill about one fourth of your plate with lean proteins, such as fish, chicken without skin, beans, eggs, or tofu. Avoid pre-made and processed foods. These tend to be higher in sodium, added sugar, and fat. Reduce your daily sodium intake. Many people with hypertension should eat less than 1,500 mg of sodium a day. Do not drink alcohol if: Your health care provider tells you not to drink. You are pregnant, may be pregnant, or are planning to become pregnant. If you drink alcohol: Limit how much you have to: 0-1 drink a day for women. 0-2 drinks a day for men. Know how much alcohol is in your drink. In the [place] one drink equals one 12 oz bottle of beer (355 mL), one 5 oz glass of wine (148 mL), or one 1 oz glass of hard liquor (44 mL). Lifestyle  Work with your health care provider to maintain a healthy body weight or to lose weight. Ask what an ideal weight is for you. Get at least 30 minutes of exercise that causes your heart to beat faster (aerobic exercise) most days of the week. Activities may include walking, swimming, or biking. Include exercise to strengthen your muscles (resistance exercise), such as Pilates or lifting weights, as part of your weekly exercise routine. Try to do these types of exercises for 30 minutes at least 3 days a week. Do not use any products that contain nicotine or tobacco. These products include cigarettes, chewing tobacco, and vaping devices, such as e-cigarettes. If you need help quitting, ask your health care provider. Monitor your blood pressure at home as told by your health care provider. Keep all follow-up visits. This is important. Medicines Take over-the-counter and prescription medicines only as told by your health care provider. Follow directions carefully. Blood  pressure medicines must be taken as prescribed. Do not skip doses of blood pressure medicine. Doing this puts you at risk for problems and can make the medicine less effective. Ask your health care provider about side effects or reactions to medicines that you should watch for. Contact a health care provider if you: Think you are having a reaction to a medicine you are taking. Have headaches that keep coming back (recurring). Feel dizzy. Have swelling in your ankles. Have trouble with your vision. Get help right away if you: Develop a severe headache or confusion. Have unusual weakness or numbness. Feel faint. Have severe pain in your chest or abdomen. Vomit repeatedly. Have trouble breathing. These symptoms may be an emergency. Get help right away. Call 911. Do not wait to see if the symptoms will go away. Do not drive yourself to the hospital. Summary Hypertension is when the force of blood pumping through your arteries is too strong. If this condition is not controlled, it may put you at risk for serious complications. Your personal target blood pressure may vary depending on your medical conditions, your age, and other factors. For most people, a normal blood pressure is less than 120/80. Hypertension is treated with lifestyle changes, medicines, or a combination of both. Lifestyle changes include losing weight, eating a healthy,  low-sodium diet, exercising more, and limiting alcohol. This information is not intended to replace advice given to you by your health care provider. Make sure you discuss any questions you have with your health care provider. Document Revised: 03/08/2021 Document Reviewed: 03/08/2021 Elsevier Patient Education  2024 [employer].

## 2024-03-26 NOTE — Progress Notes (Signed)
 Subjective:  Patient ID: Matthew Villanueva, male    DOB: [birth year]  Age: 88 y.o. MRN: [id number]  CC: Hypertension (Patient states that his BP was 155/87)   HPI Matthew Villanueva presents for f/up ----  Discussed the use of AI scribe software for clinical note transcription with the patient, who gave verbal consent to proceed.  History of Present Illness Matthew Villanueva is an 88 year old male with hypertension who presents for blood pressure management.  He has noticed elevated blood pressure readings, with the lowest being 134/67 and typically around 152/80. He does not report headache, blurred vision, dizziness, lightheadedness, chest pain, or shortness of breath. He reports slight swelling in his legs, which varies with his sitting position.  He mentions a change in his medication, specifically losartan  hydrochlorothiazide, but the dosage remains unchanged. He is uncertain if this change has affected his blood pressure readings.  He has difficulty using a new sleep apnea mask due to discomfort, leading to irregular use. He initially suspected his blood pressure machine might be faulty, but recent readings taken by someone else showed only a slight difference.     Outpatient Medications Prior to Visit  Medication Sig Dispense Refill   allopurinol  (ZYLOPRIM ) 100 MG tablet TAKE 1 TABLET BY MOUTH DAILY 90 tablet 0   aspirin EC 81 MG tablet Take 81 mg by mouth daily. Swallow whole.     blood glucose meter kit and supplies KIT Use to test blood sugar up to twice daily. DX: E11.21 1 each 0   colchicine  0.6 MG tablet Take 1 tablet (0.6 mg total) by mouth daily. 90 tablet 0   Continuous Glucose Sensor (FREESTYLE LIBRE 3 PLUS SENSOR) MISC Apply 1 Act topically every 14 (fourteen) days. Change sensor every 15 days. 6 each 1   glucose blood (COOL BLOOD GLUCOSE TEST STRIPS) test strip Use to test blood sugar up to twice daily. DX: E11.21 100 each 12   Insulin  Pen Needle 32G X 6 MM  MISC 1 Act by Does not apply route once a week. 100 each 0   Lancets (ONETOUCH ULTRASOFT) lancets Use to test blood sugar up to twice daily. DX: E11.21 100 each 12   losartan -hydrochlorothiazide (HYZAAR) 50-12.5 MG tablet Take 1 tablet by mouth daily. 90 tablet 1   Multiple Vitamin (MULTIVITAMIN) tablet Take 1 tablet by mouth daily.     nystatin  cream (MYCOSTATIN ) Apply 1 Application topically 2 (two) times daily. 30 g 3   ONETOUCH VERIO test strip USE TO TEST BLOOD SUGAR TWO TIMES A DAY 150 strip 5   Roflumilast  (ZORYVE ) 0.3 % CREA Apply 1 Act topically daily. 60 g 5   Semaglutide  (RYBELSUS ) 14 MG TABS Take 1 tablet (14 mg total) by mouth daily. 90 tablet 1   No facility-administered medications prior to visit.    ROS Review of Systems  Constitutional:  Negative for appetite change, chills, diaphoresis, fatigue and fever.  HENT: Negative.    Eyes: Negative.   Respiratory:  Positive for apnea. Negative for cough, chest tightness, shortness of breath and wheezing.   Cardiovascular:  Negative for chest pain, palpitations and leg swelling.  Gastrointestinal: Negative.  Negative for abdominal pain, constipation, diarrhea, nausea and vomiting.  Endocrine: Negative.   Genitourinary: Negative.  Negative for difficulty urinating and dysuria.  Musculoskeletal: Negative.  Negative for arthralgias and joint swelling.  Skin: Negative.  Negative for color change.  Neurological: Negative.  Negative for dizziness, weakness and headaches.  Hematological:  Negative  for adenopathy. Does not bruise/bleed easily.  Psychiatric/Behavioral:  Positive for confusion, decreased concentration and sleep disturbance. The patient is not nervous/anxious.     Objective:  BP (!) 152/70 (BP Location: Left Arm, Patient Position: Sitting, Cuff Size: Normal)   Pulse 71   Temp 98.3 F (36.8 C) (Oral)   Ht 5' 11 (1.803 m)   Wt 243 lb (110.2 kg)   SpO2 94%   BMI 33.89 kg/m   BP Readings from Last 3 Encounters:   03/26/24 (!) 152/70  02/07/24 128/68  01/08/24 138/64    Wt Readings from Last 3 Encounters:  03/26/24 243 lb (110.2 kg)  02/07/24 242 lb 3.2 oz (109.9 kg)  01/08/24 242 lb 9.6 oz (110 kg)    Physical Exam Vitals reviewed.  Constitutional:      General: He is not in acute distress.    Appearance: He is not toxic-appearing or diaphoretic.  HENT:     Nose: Nose normal.     Mouth/Throat:     Mouth: Mucous membranes are moist.  Eyes:     General: No scleral icterus.    Conjunctiva/sclera: Conjunctivae normal.  Cardiovascular:     Rate and Rhythm: Normal rate and regular rhythm.     Heart sounds: No murmur heard.    No friction rub. No gallop.  Pulmonary:     Effort: Pulmonary effort is normal.     Breath sounds: No stridor. No wheezing, rhonchi or rales.  Abdominal:     General: Abdomen is protuberant. Bowel sounds are normal. There is no distension.     Palpations: Abdomen is soft. There is no hepatomegaly, splenomegaly or mass.     Tenderness: There is no abdominal tenderness.  Musculoskeletal:        General: Normal range of motion.     Cervical back: Neck supple.     Right lower leg: No edema.     Left lower leg: No edema.  Lymphadenopathy:     Cervical: No cervical adenopathy.  Skin:    General: Skin is warm and dry.     Findings: No rash.  Neurological:     General: No focal deficit present.     Mental Status: He is alert.  Psychiatric:        Mood and Affect: Mood normal.        Behavior: Behavior normal.     Lab Results  Component Value Date   WBC 9.0 02/07/2024   HGB 15.1 02/07/2024   HCT 44.6 02/07/2024   PLT 220.0 02/07/2024   GLUCOSE 172 (H) 02/07/2024   CHOL 171 09/24/2023   TRIG 171.0 (H) 02/07/2024   HDL 40.50 09/24/2023   LDLDIRECT 81.0 01/05/2022   LDLCALC 61 09/24/2023   ALT 22 09/24/2023   AST 22 09/24/2023   NA 137 02/07/2024   K 3.9 02/07/2024   CL 101 02/07/2024   CREATININE 1.04 02/07/2024   BUN 24 (H) 02/07/2024   CO2 24  02/07/2024   TSH 1.37 02/07/2024   PSA 2.86 12/03/2017   HGBA1C 8.2 (H) 02/07/2024   MICROALBUR 12.8 (H) 09/24/2023    VAS US  CAROTID Result Date: 12/05/2023 Carotid Arterial Duplex Study Patient Name:  Matthew Villanueva  Date of Exam:   12/05/2023 Medical Rec #: [id number]             Accession #:    [id number] Date of Birth: 05-16-1935            Patient Gender: M Patient Age:  87 years Exam Location:  [address] Procedure:      VAS US  CAROTID Referring Phys: EMILY MONGE --------------------------------------------------------------------------------  Indications:  Carotid artery disease. Risk Factors: Hypertension, hyperlipidemia, Diabetes, past history of smoking. Performing Technologist: Garnette Rockers  Examination Guidelines: A complete evaluation includes B-mode imaging, spectral Doppler, color Doppler, and power Doppler as needed of all accessible portions of each vessel. Bilateral testing is considered an integral part of a complete examination. Limited examinations for reoccurring indications may be performed as noted.  Right Carotid Findings: +----------+--------+--------+--------+------------------+------------------+           PSV cm/sEDV cm/sStenosisPlaque DescriptionComments           +----------+--------+--------+--------+------------------+------------------+ CCA Prox  127     12                                intimal thickening +----------+--------+--------+--------+------------------+------------------+ CCA Distal96      12                                intimal thickening +----------+--------+--------+--------+------------------+------------------+ ICA Prox  80      8       1-39%   hyperechoic                          +----------+--------+--------+--------+------------------+------------------+ ICA Distal159     17                                                   +----------+--------+--------+--------+------------------+------------------+ ECA        120     7                                                    +----------+--------+--------+--------+------------------+------------------+ +----------+--------+-------+--------+-------------------+           PSV cm/sEDV cmsDescribeArm Pressure (mmHG) +----------+--------+-------+--------+-------------------+ Subclavian171                    147                 +----------+--------+-------+--------+-------------------+ +---------+--------+--+--------+-+ VertebralPSV cm/s41EDV cm/s6 +---------+--------+--+--------+-+  Left Carotid Findings: +----------+--------+--------+--------+------------------+------------------+           PSV cm/sEDV cm/sStenosisPlaque DescriptionComments           +----------+--------+--------+--------+------------------+------------------+ CCA Prox  92      11                                intimal thickening +----------+--------+--------+--------+------------------+------------------+ CCA Distal79      11                                intimal thickening +----------+--------+--------+--------+------------------+------------------+ ICA Prox  61      10      1-39%   calcific                             +----------+--------+--------+--------+------------------+------------------+ ICA Distal96  20                                                   +----------+--------+--------+--------+------------------+------------------+ ECA       116     9                                                    +----------+--------+--------+--------+------------------+------------------+ +----------+--------+--------+--------+-------------------+           PSV cm/sEDV cm/sDescribeArm Pressure (mmHG) +----------+--------+--------+--------+-------------------+ Subclavian221                     156                 +----------+--------+--------+--------+-------------------+ +---------+--------+--+--------+-+ VertebralPSV cm/s59EDV  cm/s9 +---------+--------+--+--------+-+   Summary: Right Carotid: Velocities in the right ICA are consistent with a 1-39% stenosis.                Mild mixed plaque noted in the carotid artery. Left Carotid: Velocities in the left ICA are consistent with a 1-39% stenosis.               Mild mixed plaque noted in the carotid artery. Vertebrals:  Bilateral vertebral arteries demonstrate antegrade flow. Subclavians: Normal flow hemodynamics were seen in bilateral subclavian              arteries. *See table(s) above for measurements and observations.  Electronically signed by Gordy Bergamo MD on 12/05/2023 at 6:22:58 PM.    Final     Assessment & Plan:   Essential hypertension- BP is not at goal. Will add a CCB. -     amLODIPine Besylate; Take 1 tablet (5 mg total) by mouth daily.  Dispense: 90 tablet; Refill: 0  Type 2 diabetes mellitus with diabetic nephropathy, without long-term current use of insulin  (HCC)     Follow-up: Return in about 3 months (around 06/26/2024).  Debby Molt, MD

## 2024-04-02 DIAGNOSIS — G4733 Obstructive sleep apnea (adult) (pediatric): Secondary | ICD-10-CM | POA: Diagnosis not present

## 2024-04-08 ENCOUNTER — Ambulatory Visit: Admitting: Internal Medicine

## 2024-04-09 ENCOUNTER — Telehealth: Payer: Self-pay

## 2024-04-09 NOTE — Telephone Encounter (Signed)
 Copied from CRM #[id number]. 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 She would like if a nurse could call back at [phone].

## 2024-04-09 NOTE — Telephone Encounter (Signed)
Please review. Sent to the wrong office.  KP

## 2024-04-16 ENCOUNTER — Telehealth: Payer: Self-pay

## 2024-04-16 NOTE — Telephone Encounter (Signed)
 Please advise

## 2024-04-16 NOTE — Telephone Encounter (Signed)
 Copied from CRM #[id number]. Topic: Clinical - Medication Question >> Apr 16, 2024  9:41 AM [name] wrote: Reason for CRM: Semaglutide  (RYBELSUS ) 14 MG TABS - refill is ready but they have a copay, patient was getting assistance to get it free before, asking if he can get the assistance again- please call [phone]

## 2024-04-17 NOTE — Telephone Encounter (Signed)
 Called patient to discuss Rybelsus  cost. Reminded pt of change to NovoNordisk PAP, they are no longer including Medicare patients for PAP starting 2026 and they have recently announced that they will no longer fill refills starting 04/14/24. Pt notes Rybelsus  was $250 for 30 DS and he did already purchase it. Informed pt that part of that cost is a drug deductible and copay should be less than that however the deductible will reset January 2026. Pt verbalized understanding. Advised pt to let us  know if copay becomes unaffordable so that we can discuss other diabetes management options  Darrelyn Drum, PharmD, BCPS, CPP Clinical Pharmacist Practitioner [facility] [phone]

## 2024-04-22 NOTE — Telephone Encounter (Signed)
 Is there anything we can do to get better blood sugar control?

## 2024-04-25 NOTE — Telephone Encounter (Signed)
 Is he willing to restart metformin ?

## 2024-04-25 NOTE — Telephone Encounter (Signed)
 Please advise

## 2024-05-02 DIAGNOSIS — G4733 Obstructive sleep apnea (adult) (pediatric): Secondary | ICD-10-CM | POA: Diagnosis not present

## 2024-06-20 ENCOUNTER — Other Ambulatory Visit: Payer: Self-pay | Admitting: Internal Medicine

## 2024-06-20 DIAGNOSIS — I10 Essential (primary) hypertension: Secondary | ICD-10-CM

## 2024-08-06 ENCOUNTER — Ambulatory Visit: Admitting: Internal Medicine

## 2024-12-03 ENCOUNTER — Ambulatory Visit
